# Patient Record
Sex: Female | Born: 1960 | Race: White | Hispanic: No | Marital: Married | Smoking: Former smoker
Health system: Southern US, Community
[De-identification: ages and names within clinical notes are randomized; demographics above are authoritative.]

## PROBLEM LIST (undated history)

## (undated) DIAGNOSIS — I429 Cardiomyopathy, unspecified: Secondary | ICD-10-CM

## (undated) DIAGNOSIS — F5000 Anorexia nervosa, unspecified: Secondary | ICD-10-CM

## (undated) DIAGNOSIS — D649 Anemia, unspecified: Secondary | ICD-10-CM

## (undated) DIAGNOSIS — G35D Multiple sclerosis, unspecified: Secondary | ICD-10-CM

## (undated) DIAGNOSIS — F4310 Post-traumatic stress disorder, unspecified: Secondary | ICD-10-CM

## (undated) DIAGNOSIS — M797 Fibromyalgia: Secondary | ICD-10-CM

## (undated) DIAGNOSIS — M26609 Unspecified temporomandibular joint disorder, unspecified side: Secondary | ICD-10-CM

## (undated) DIAGNOSIS — L89159 Pressure ulcer of sacral region, unspecified stage: Secondary | ICD-10-CM

## (undated) DIAGNOSIS — R579 Shock, unspecified: Secondary | ICD-10-CM

## (undated) DIAGNOSIS — I509 Heart failure, unspecified: Secondary | ICD-10-CM

## (undated) DIAGNOSIS — K7200 Acute and subacute hepatic failure without coma: Secondary | ICD-10-CM

## (undated) DIAGNOSIS — J189 Pneumonia, unspecified organism: Secondary | ICD-10-CM

## (undated) DIAGNOSIS — K589 Irritable bowel syndrome without diarrhea: Secondary | ICD-10-CM

## (undated) DIAGNOSIS — G35 Multiple sclerosis: Secondary | ICD-10-CM

## (undated) DIAGNOSIS — K219 Gastro-esophageal reflux disease without esophagitis: Secondary | ICD-10-CM

## (undated) HISTORY — PX: OTHER SURGICAL HISTORY: SHX169

## (undated) HISTORY — PX: TUBAL LIGATION: SHX77

## (undated) HISTORY — PX: BUNIONECTOMY: SHX129

## (undated) HISTORY — PX: PEG TUBE REMOVAL: SHX2187

## (undated) HISTORY — PX: TONSILLECTOMY: SUR1361

---

## 1998-06-12 ENCOUNTER — Other Ambulatory Visit: Admission: RE | Admit: 1998-06-12 | Discharge: 1998-06-12 | Payer: Self-pay | Admitting: Gynecology

## 1999-07-30 ENCOUNTER — Other Ambulatory Visit: Admission: RE | Admit: 1999-07-30 | Discharge: 1999-07-30 | Payer: Self-pay | Admitting: Gynecology

## 2001-11-14 ENCOUNTER — Other Ambulatory Visit: Admission: RE | Admit: 2001-11-14 | Discharge: 2001-11-14 | Payer: Self-pay | Admitting: Gynecology

## 2002-12-27 ENCOUNTER — Other Ambulatory Visit: Admission: RE | Admit: 2002-12-27 | Discharge: 2002-12-27 | Payer: Self-pay | Admitting: Gynecology

## 2003-02-06 ENCOUNTER — Observation Stay (HOSPITAL_COMMUNITY): Admission: EM | Admit: 2003-02-06 | Discharge: 2003-02-06 | Payer: Self-pay

## 2003-06-13 ENCOUNTER — Encounter (HOSPITAL_COMMUNITY): Admission: RE | Admit: 2003-06-13 | Discharge: 2003-09-11 | Payer: Self-pay | Admitting: General Surgery

## 2003-06-20 ENCOUNTER — Encounter: Admission: RE | Admit: 2003-06-20 | Discharge: 2003-06-20 | Payer: Self-pay | Admitting: General Surgery

## 2003-10-12 ENCOUNTER — Ambulatory Visit (HOSPITAL_COMMUNITY): Admission: RE | Admit: 2003-10-12 | Discharge: 2003-10-12 | Payer: Self-pay | Admitting: Family Medicine

## 2004-03-31 ENCOUNTER — Other Ambulatory Visit: Admission: RE | Admit: 2004-03-31 | Discharge: 2004-03-31 | Payer: Self-pay | Admitting: Gynecology

## 2005-04-07 ENCOUNTER — Other Ambulatory Visit: Admission: RE | Admit: 2005-04-07 | Discharge: 2005-04-07 | Payer: Self-pay | Admitting: Gynecology

## 2005-07-06 HISTORY — PX: DILATION AND CURETTAGE OF UTERUS: SHX78

## 2006-03-25 ENCOUNTER — Encounter: Admission: RE | Admit: 2006-03-25 | Discharge: 2006-03-25 | Payer: Self-pay | Admitting: Gynecology

## 2006-04-08 ENCOUNTER — Ambulatory Visit (HOSPITAL_COMMUNITY): Admission: RE | Admit: 2006-04-08 | Discharge: 2006-04-08 | Payer: Self-pay | Admitting: Gynecology

## 2006-05-24 ENCOUNTER — Ambulatory Visit (HOSPITAL_COMMUNITY): Admission: RE | Admit: 2006-05-24 | Discharge: 2006-05-24 | Payer: Self-pay | Admitting: Gynecology

## 2006-07-20 ENCOUNTER — Encounter: Admission: RE | Admit: 2006-07-20 | Discharge: 2006-07-20 | Payer: Self-pay | Admitting: Gynecology

## 2007-07-25 ENCOUNTER — Encounter: Admission: RE | Admit: 2007-07-25 | Discharge: 2007-07-25 | Payer: Self-pay | Admitting: Gynecology

## 2007-08-18 ENCOUNTER — Inpatient Hospital Stay (HOSPITAL_COMMUNITY): Admission: AD | Admit: 2007-08-18 | Discharge: 2007-08-20 | Payer: Self-pay | Admitting: Internal Medicine

## 2007-09-20 ENCOUNTER — Other Ambulatory Visit: Admission: RE | Admit: 2007-09-20 | Discharge: 2007-09-20 | Payer: Self-pay | Admitting: Gynecology

## 2008-01-25 ENCOUNTER — Encounter: Admission: RE | Admit: 2008-01-25 | Discharge: 2008-01-25 | Payer: Self-pay | Admitting: Gynecology

## 2009-03-27 ENCOUNTER — Ambulatory Visit (HOSPITAL_COMMUNITY): Admission: RE | Admit: 2009-03-27 | Discharge: 2009-03-27 | Payer: Self-pay | Admitting: Family Medicine

## 2009-03-28 ENCOUNTER — Encounter: Admission: RE | Admit: 2009-03-28 | Discharge: 2009-03-28 | Payer: Self-pay | Admitting: Family Medicine

## 2010-04-04 ENCOUNTER — Emergency Department (HOSPITAL_COMMUNITY): Admission: EM | Admit: 2010-04-04 | Discharge: 2010-04-05 | Payer: Self-pay | Admitting: Emergency Medicine

## 2010-05-20 ENCOUNTER — Ambulatory Visit: Payer: Self-pay | Admitting: Psychiatry

## 2010-07-26 ENCOUNTER — Encounter: Payer: Self-pay | Admitting: Gynecology

## 2010-07-27 ENCOUNTER — Encounter: Payer: Self-pay | Admitting: Family Medicine

## 2010-07-27 ENCOUNTER — Encounter: Payer: Self-pay | Admitting: Neurology

## 2010-07-27 ENCOUNTER — Encounter: Payer: Self-pay | Admitting: Gynecology

## 2010-08-28 ENCOUNTER — Other Ambulatory Visit: Payer: Self-pay | Admitting: Gastroenterology

## 2010-08-28 DIAGNOSIS — R195 Other fecal abnormalities: Secondary | ICD-10-CM

## 2010-09-09 ENCOUNTER — Other Ambulatory Visit: Payer: Self-pay

## 2010-09-16 ENCOUNTER — Other Ambulatory Visit: Payer: Self-pay

## 2010-09-18 LAB — POCT I-STAT, CHEM 8
BUN: 13 mg/dL (ref 6–23)
Calcium, Ion: 1.12 mmol/L (ref 1.12–1.32)
Chloride: 105 mEq/L (ref 96–112)
Creatinine, Ser: 0.6 mg/dL (ref 0.4–1.2)
Glucose, Bld: 95 mg/dL (ref 70–99)
HCT: 44 % (ref 36.0–46.0)
Hemoglobin: 15 g/dL (ref 12.0–15.0)
Potassium: 4 mEq/L (ref 3.5–5.1)
Sodium: 139 mEq/L (ref 135–145)
TCO2: 26 mmol/L (ref 0–100)

## 2010-09-18 LAB — CBC
HCT: 38.1 % (ref 36.0–46.0)
Hemoglobin: 13.2 g/dL (ref 12.0–15.0)
MCH: 31.5 pg (ref 26.0–34.0)
MCHC: 34.7 g/dL (ref 30.0–36.0)
MCV: 90.8 fL (ref 78.0–100.0)
Platelets: 210 10*3/uL (ref 150–400)
RBC: 4.19 MIL/uL (ref 3.87–5.11)
RDW: 13.3 % (ref 11.5–15.5)
WBC: 9.5 10*3/uL (ref 4.0–10.5)

## 2010-09-18 LAB — ETHANOL: Alcohol, Ethyl (B): 5 mg/dL (ref 0–10)

## 2010-09-18 LAB — RAPID URINE DRUG SCREEN, HOSP PERFORMED
Amphetamines: NOT DETECTED
Barbiturates: NOT DETECTED
Benzodiazepines: POSITIVE — AB
Cocaine: NOT DETECTED
Opiates: NOT DETECTED
Tetrahydrocannabinol: NOT DETECTED

## 2010-09-23 ENCOUNTER — Other Ambulatory Visit: Payer: Self-pay

## 2010-09-30 ENCOUNTER — Other Ambulatory Visit: Payer: Self-pay

## 2010-11-18 NOTE — H&P (Signed)
NAMEMOMOKO, SLEZAK           ACCOUNT NO.:  [id number]   MEDICAL RECORD NO.:  [id number]          PATIENT TYPE:  INP   LOCATION:  5503                         FACILITY:  [facility]   PHYSICIAN:  Kela Millin, M.D.DATE OF BIRTH:  [birth year]   DATE OF ADMISSION:  08/18/2007  DATE OF DISCHARGE:                              HISTORY & PHYSICAL   PRIMARY CARE PHYSICIAN:  Pam Drown, M.D.   CHIEF COMPLAINT:  Worsening spasticity.   HISTORY OF PRESENT ILLNESS:  The patient is a 50 year old white female  with past medical history significant for fibromyalgia, irritable bowel  syndrome, multiple medication allergies/adverse reactions, as well as a  longstanding history of spasms/tightness in her jaws which has resulted  in her only eating pureed food, also per E-Chart records with a history  of interstitial cystitis, and she presents with the above complaints.  Her primary care physician saw her at the office earlier on the day of  presentation and noted that the patient appeared to be having myoclonic  jerks in both upper and lower extremities and they were so severe that  she was having difficulty walking.  She offered to start the patient on  Baclofen and Xanax but because of her prior history of medication  allergies/reactions, she is generally hesitant about taking any  medications and so she refused any Baclofen but agreed to have a  prescription of Xanax written for her.  Dr. Corliss Blacker spoke with Dr.  Vickey Huger, who is the neurologist that has seen her in the past, and they  agreed that it would be helpful to admit the patient for an MRI and  further evaluation in the hospital.  It is noted that the patient has  had a workup over the years to further evaluate the cause of her  symptoms and multiple sclerosis has been suspected as she also has a  family history of this.  Her workup in the past has included an MRI  without contrast which was negative, and she also was evaluated  at one  of the tertiary care centers and was told that her reflexes were  consistent with multiple sclerosis but that the muscle testing was not.  Per primary care physician, in October 2008 she had an episode of speech  difficulty as well as some urinary incontinence and then on January 15  she presented at the office with burning in her feet and had two beats  of clonus on her exam.  Per the patient, foot exam at the office was  consistent with peripheral neuropathy.  The patient denies any visual  changes, dysphagia, focal weakness, fevers, cough, chest pain, diarrhea,  melena or hematochezia.   PAST MEDICAL HISTORY:  1. As above.  2. History of GERD.   MEDICATIONS:  1. Colace daily.  2. Tylenol p.r.n.  3. Infant Mylicon Drops p.r.n.  4. Pepcid A.C. p.r.n.   ALLERGIES:  SCOPOLAMINE, PENICILLIN.  She also reports that PREDNISONE  causes muscle pain/myalgias and has had multiple other reactions to many  medications.   SOCIAL HISTORY:  She quit tobacco in 1988 after smoking for 16 years.  Remote history of alcohol, marijuana and speed.  She states she had  not had any of this in 25 years.   FAMILY HISTORY:  Her aunt is deceased.  She has relapsing-remitting  multiple sclerosis and breast cancer.  Her half-brother had progressive  multiple sclerosis and died at age of 106.   REVIEW OF SYSTEMS:  As per HPI.   PHYSICAL EXAM:  GENERAL:  The patient is a middle-aged white female,  appears older than stated age and also appears anxious, in no apparent  distress.  VITAL SIGNS:  The temperature is 97.7 with a pulse of 93, respiratory  rate of 18, blood pressure of 110/72, O2 saturation 100% on room air.  HEENT:  PERRL, EOMI.  Sclerae anicteric, moist mucous membranes and no  oral exudates.  NECK:  Supple, no adenopathy, no thyromegaly, and no JVD.  LUNGS:  Clear to auscultation bilaterally.  No crackles or wheezes.  CARDIOVASCULAR:  Regular rate and rhythm.  Normal S1 and S2.   ABDOMEN:  Soft, bowel sounds present, nontender, nondistended, no  organomegaly and no masses palpable.  EXTREMITIES:  No cyanosis and no edema.  NEUROLOGIC:  She is alert and oriented x3.  Cranial nerves II-XII are  grossly intact.  Her strength is 4-5/5 and symmetric, hyperreflexia in  both upper and lower extremities.  Muscle tone is normal.   LABS:  CBC and CMET ordered and pending.   ASSESSMENT AND PLAN:  1. Spasticity.  Subjective muscle tightness, also question myoclonus.      As discussed above.  Will obtain an MRI to evaluate for multiple      sclerosis lesions.  The patient is not willing to take any      medications except low-dose Xanax to help have the MRI done.  I      have consulted neurology and Dr. Thad Ranger will see the patient (on      call for Dr. Vickey Huger).  2. Gastroesophageal reflux disease.  Pepcid A.C. p.r.n. as per patient      preference.  3. History of fibromyalgia.  4. History of irritable bowel syndrome.  5. History of multiple medication allergies/intolerances.      Kela Millin, M.D.  Electronically Signed     ACV/MEDQ  D:  08/19/2007  T:  08/19/2007  Job:  [id number]   cc:   Pam Drown, M.D.  Melvyn Novas, M.D.

## 2010-11-18 NOTE — Consult Note (Signed)
[name], NULL           ACCOUNT NO.:  [id number]   MEDICAL RECORD NO.:  [id number]          PATIENT TYPE:  INP   LOCATION:  5503                         FACILITY:  [facility]   PHYSICIAN:  Casimiro Needle L. Reynolds, M.D.DATE OF BIRTH:  [birth year]   DATE OF CONSULTATION:  08/18/2007  DATE OF DISCHARGE:                                 CONSULTATION   REASON FOR EVALUATION:  Spasticity, question MS.   HISTORY OF PRESENT ILLNESS:  This is an inpatient consultation and  evaluation of this existing [facility] patient, a 50-  year-old woman who has been seen in our office by Dr. Vickey Huger in the  past, although records not available at this time.  She had various  neurologic symptoms at that time, including muscle pain, and the  question of MS was raised.  She had a normal MRI at that time.  She said  she had an EMG/NCV and was diagnosed with neuropathy.  She comes in  today complaining of the various problems going on for the past few  weeks.  She states she had a bad flare-up of pain in her legs a few  weeks ago, which lasted a couple of weeks but has left her with residual  sensations of muscle tightness and spasticity, which has made her feel  unsteady when she walks.  She has also been more tremulous recently.  She also developed some recent stress urinary incontinence with coughing  or standing up.  She says that she had recent laboratory testing in Dr.  Darrell Jewel office, which revealed some deficits.  For reasons that are  not exactly clear to me, she is admitted to the hospital with plans for  an MRI.  She is very concerned about receiving contrast for this MRI, as  she reports a history of allergy to IODINATED CT CONTRAST as well as  other substances.  Neurologic consultation is requested.   PAST MEDICAL HISTORY:  She reports a history of gastroesophageal reflux  disease.  She also has a history of fibromyalgia and irritable bowel  syndrome.  She claims that she  can only eat blended foods because she is  unable to open her mouth.  She says that she has had problems with  muscle tightness for as long as 18 years, but this has defied diagnosis.   Family/social history/review of systems per admission H&P by Dr. Suanne Marker.   MEDICATIONS:  The patient takes over-the-counter Pepcid A.C. and Tylenol  as needed.   ALLERGIES:  She reports having a bad reaction scopolamine in the past,  which per old records seems to be due to side effects.  She also reports  allergies to ASPIRIN and PENICILLIN and intolerances to numerous other  substances.   PHYSICAL EXAMINATION:  VITAL SIGNS:  Temperature 98.0, blood pressure  January 113/77, pulse 109, respirations 21, O2 saturation 100% on some  room air.  GENERAL:  This is a thin, anxious-appearing woman, who is seated, in no  distress.  HEAD:  Cranium is normocephalic, atraumatic.  Oropharynx benign.  NECK:  Supple without carotid or supraclavicular bruits.  HEART:  Regular rate and without  murmurs.  NEUROLOGIC:  Mental status:  She is awake and alert.  She is oriented to  time and place.  Remote and memory are adequate for history-giving.  Speech is fluent and not dysarthric.  Cranial nerves:  Pupils are equal  and reactive.  Extraocular movements full without nystagmus.  Visual  fields full to confrontation.  Hearing is intact to conversational  speech.  Face moves symmetrically.  She does not protrude her tongue  very well and does not open her mouth wide enough to visualize her  palate.  Motor:  Normal bulk and tone.  She has give-away weakness,  especially distally in the extremities.  Sensory:  Intact to light  touch, pinprick and double simultaneous stimulation in all extremities.  Coordination:  Rapid movements are performed accurately.  Finger-to-nose  is performed accurately but it elicits a little bit of a kinetic tremor.  Heel-to-shin is performed accurately.  Gait:  he arises slowly from a  chair.   Her gait is a little bit listing and wide-based, but she  ambulates reasonably well.  She is able to perform a Romberg maneuver  and can stand on tiptoe briefly.  Reflexes:  2+ and symmetric.  Toes are  downgoing bilaterally.  No ankle clonus.   LABORATORY REVIEW:  CBC and CMET  are essentially normal.   IMPRESSION:  1. Subjective muscle tightness and weakness.  She does not have very      much in the way of objective abnormalities on examination, but she      does have give-away weakness.  2. Bilateral foot pain, presumably secondary to neuropathy.  3. Question multiple sclerosis.  I doubt she has this, but apparently      there has been a persistent question.  The only new symptom that      really strongly suggests this is the urinary incontinence, although      I suspect that is more likely due to pelvic relaxation.  She is      fearful of receiving contrast.   RECOMMENDATIONS:  Will check an MRI of the brain, and it is also  appropriate to check MRI of the cervical and thoracic spines, to look  for cord lesions, especially given the urinary symptoms.  I do not think  it is very important that these studies be done with contrast, since if  they are normal, contrast not be meaningfully improve the yield.  Furthermore, she can always have a contrast study if these studies are  abnormal.  If the study is unremarkable, we will treat only with  reassurance.      Michael L. Thad Ranger, M.D.  Electronically Signed     MLR/MEDQ  D:  08/18/2007  T:  08/20/2007  Job:  [id number]   cc:   Pam Drown, M.D.  Melvyn Novas, M.D.

## 2010-11-21 NOTE — Consult Note (Signed)
[name], [name]                     ACCOUNT NO.:  [id number]   MEDICAL RECORD NO.:  [id number]                   PATIENT TYPE:  OBV   LOCATION:  0447                                 [facility]ACILITY[facility]   PHYSICIAN:  Jackie Plum, M.D.             DATE OF BIRTH:  08-May-1961   DATE OF CONSULTATION:  02/05/2003  DATE OF DISCHARGE:  02/06/2003                                   CONSULTATION   REPORT TITLE:  INTERNAL MEDICINE CONSULTATION   REFERRING PHYSICIAN:  Pam Drown, M.D.   PROBLEM LIST:  1. Muscle weakness, feeling of dehydration, mouth dryness secondary to     adverse reaction to scopolamine.  2. History of fibromyalgia.  3. Interstitial cystitis.  4. Irritable bowel syndrome.  5. Status post urethroscopy three days ago.   HISTORY OF PRESENT ILLNESS:  The patient is a 50 year old Caucasian lady  with history of fibromyalgia who was asked to be evaluated by, Dr. Corliss Blacker,  of [facility] today.  The patient had presented with complaints  of a feeling of dehydration, subjectively over the last two days.  She had  also indicated some subjective shortness of breath with some left-sided  chest discomfort on breathing.  These symptoms were not significant at this  time during my evaluation.  She had been started on scopolamine patches  recently which were discontinued about three days ago on account of her  symptomatology.  The patient was asked to be evaluated by EDP today on  account of suspicion for possible syndrome related to scopolamine which may  predispose the patient to respiratory arrest.  At the time of my evaluation,  Dr. Darrell Jewel office is closed and I could not clarify the reasons related  to this suspicion.   REVIEW OF SYSTEMS:  GENERAL:  The patient denies any history of fever,  chills, sore throat, post nasal drainage, congestion, cough, sputum  production, shortness of breath at the time of my evaluation.  GASTROINTESTINAL:  No GI  symptoms.  GENITOURINARY:  She had had some  difficulty urinating.  However, she was able to urinate without any problems  during ER evaluation.  NEUROLOGICAL:  She had been restless prior to my  evaluation but had calmed down at the time of my evaluation.  There is no  history of confusion or loss of consciousness but had felt a little bit  dizzy.   PAST MEDICAL HISTORY:  See problem list above.   MEDICATIONS:  Guiafenesin and scopolamine.   ALLERGIES:  PENICILLIN.   REVIEW OF SYSTEMS:  Significant positive and negative symptomatology as in  HPI above.   PHYSICAL EXAMINATION:  VITAL SIGNS:  Blood pressure 170/91, pulse 97,  respiratory rate 22 on examination in the ED.  However, orthostatic checks  during my evaluation after receiving one set of IV fluids was notable for BP  89/52 and heart rate 59, lying.  Corresponding blood pressure of 92/44 and  pulse rate of 102 on standing.  GENERAL:  Acutely ill-looking.  She was not in any cardiopulmonary distress.  HEENT:  She had mild buccal mucosa dryness without any exudate or erythema.  LUNGS:  Unremarkable.  NECK:  No thyromegaly.  CARDIAC:  No gallops.  ABDOMEN:  Negative for any acute abnormalities.  EXTREMITIES:  No edema.  CENTRAL NERVOUS SYSTEM:  She is alert and oriented x3.  No other  abnormalities.   LABORATORY DATA:  WBC 5.1, hemoglobin 13.3, hematocrit 39.7, MCH 9.7,  platelet count 197.  Sodium 141, potassium 3.3, CO2 33, chloride 110,  glucose 97, BUN 7, creatinine 0.8.  Calcium 9.3, total protein 7.1, albumin  4.5, AST 20, ALT 18, alkaline phosphatase 32, total bilirubin 0.7.   CT scan of the lower extremities was done to rule out possible PE on account  of prior complaints of shortness of breath, I believe.  Impression of CT  scan was notable for absence of any evidence of pulmonary embolism.  No DVT  or bruises.  Small amount of free  fluid noted in the pelvis.   IMPRESSION:  Adverse reaction to scopolamine.  The  patient presents with  classical of symptoms which is clinically significant to scopolamine.  These  include dry mouth, dizziness and difficulty urinating.  The patient has been  moved to the unit from the ED without significant problems.  However, still  has some amount of dizziness.  She is orthostatic clinically by pulse with  increasing pulse of 59 to 122 on standing up.   PLAN:  The plan of care will be to admit the patient x24 hour observation.  IV fluids were given and symptomatic support will be offered as well.  The  patient's complaints of generalized weakness most likely related to side  effects of scopolamine.                                               Jackie Plum, M.D.    GO/MEDQ  D:  02/06/2003  T:  02/06/2003  Job:  [id number]   cc:   Pam Drown, M.D.  [address]  [place]  [place] [address]  Fax: [phone]   Pearletha Alfred, M.D.  [address]  [place] [address]  Fax: [phone]   Lilyan Punt. Sydnee Levans, M.D.  [address]  [place]  [place] [address]  Fax: [phone]

## 2011-03-27 LAB — COMPREHENSIVE METABOLIC PANEL
ALT: 28
AST: 21
Albumin: 4.5
Alkaline Phosphatase: 36 — ABNORMAL LOW
BUN: 8
CO2: 24
Calcium: 9.5
Chloride: 107
Creatinine, Ser: 0.62
GFR calc Af Amer: >60
GFR calc non Af Amer: >60
Glucose, Bld: 100 — ABNORMAL HIGH
Potassium: 3.6
Sodium: 139
Total Bilirubin: 0.6
Total Protein: 6.7

## 2011-03-27 LAB — CBC
HCT: 39.6
Hemoglobin: 13.4
MCHC: 33.9
MCV: 90.6
Platelets: 225
RBC: 4.37
RDW: 12.5
WBC: 5.4

## 2011-07-07 HISTORY — PX: THORACENTESIS: SHX235

## 2012-06-05 HISTORY — PX: TRACHEOSTOMY: SUR1362

## 2012-06-06 ENCOUNTER — Emergency Department (HOSPITAL_COMMUNITY): Payer: Managed Care, Other (non HMO)

## 2012-06-06 ENCOUNTER — Encounter (HOSPITAL_COMMUNITY): Payer: Self-pay | Admitting: *Deleted

## 2012-06-06 ENCOUNTER — Inpatient Hospital Stay (HOSPITAL_COMMUNITY)
Admission: EM | Admit: 2012-06-06 | Discharge: 2012-06-20 | DRG: 004 | Disposition: A | Discharge status: Other Acute Inpatient Hospital | Payer: Managed Care, Other (non HMO) | Attending: Pulmonary Disease | Admitting: Pulmonary Disease

## 2012-06-06 DIAGNOSIS — T68XXXA Hypothermia, initial encounter: Secondary | ICD-10-CM

## 2012-06-06 DIAGNOSIS — N390 Urinary tract infection, site not specified: Secondary | ICD-10-CM

## 2012-06-06 DIAGNOSIS — L89109 Pressure ulcer of unspecified part of back, unspecified stage: Secondary | ICD-10-CM | POA: Diagnosis present

## 2012-06-06 DIAGNOSIS — E876 Hypokalemia: Secondary | ICD-10-CM | POA: Diagnosis present

## 2012-06-06 DIAGNOSIS — E162 Hypoglycemia, unspecified: Secondary | ICD-10-CM

## 2012-06-06 DIAGNOSIS — I420 Dilated cardiomyopathy: Secondary | ICD-10-CM

## 2012-06-06 DIAGNOSIS — A419 Sepsis, unspecified organism: Secondary | ICD-10-CM

## 2012-06-06 DIAGNOSIS — R570 Cardiogenic shock: Secondary | ICD-10-CM | POA: Diagnosis present

## 2012-06-06 DIAGNOSIS — R5381 Other malaise: Secondary | ICD-10-CM | POA: Diagnosis present

## 2012-06-06 DIAGNOSIS — L02419 Cutaneous abscess of limb, unspecified: Secondary | ICD-10-CM | POA: Diagnosis present

## 2012-06-06 DIAGNOSIS — E43 Unspecified severe protein-calorie malnutrition: Secondary | ICD-10-CM | POA: Diagnosis present

## 2012-06-06 DIAGNOSIS — J189 Pneumonia, unspecified organism: Secondary | ICD-10-CM

## 2012-06-06 DIAGNOSIS — L89159 Pressure ulcer of sacral region, unspecified stage: Secondary | ICD-10-CM

## 2012-06-06 DIAGNOSIS — J690 Pneumonitis due to inhalation of food and vomit: Secondary | ICD-10-CM

## 2012-06-06 DIAGNOSIS — Z8659 Personal history of other mental and behavioral disorders: Secondary | ICD-10-CM

## 2012-06-06 DIAGNOSIS — R001 Bradycardia, unspecified: Secondary | ICD-10-CM

## 2012-06-06 DIAGNOSIS — G9341 Metabolic encephalopathy: Secondary | ICD-10-CM | POA: Diagnosis present

## 2012-06-06 DIAGNOSIS — D649 Anemia, unspecified: Secondary | ICD-10-CM

## 2012-06-06 DIAGNOSIS — G709 Myoneural disorder, unspecified: Secondary | ICD-10-CM

## 2012-06-06 DIAGNOSIS — R579 Shock, unspecified: Secondary | ICD-10-CM

## 2012-06-06 DIAGNOSIS — E861 Hypovolemia: Secondary | ICD-10-CM

## 2012-06-06 DIAGNOSIS — B952 Enterococcus as the cause of diseases classified elsewhere: Secondary | ICD-10-CM | POA: Diagnosis present

## 2012-06-06 DIAGNOSIS — R627 Adult failure to thrive: Secondary | ICD-10-CM | POA: Diagnosis present

## 2012-06-06 DIAGNOSIS — D689 Coagulation defect, unspecified: Secondary | ICD-10-CM

## 2012-06-06 DIAGNOSIS — L8993 Pressure ulcer of unspecified site, stage 3: Secondary | ICD-10-CM | POA: Diagnosis present

## 2012-06-06 DIAGNOSIS — G9340 Encephalopathy, unspecified: Secondary | ICD-10-CM

## 2012-06-06 DIAGNOSIS — D696 Thrombocytopenia, unspecified: Secondary | ICD-10-CM

## 2012-06-06 DIAGNOSIS — M26609 Unspecified temporomandibular joint disorder, unspecified side: Secondary | ICD-10-CM | POA: Diagnosis present

## 2012-06-06 DIAGNOSIS — I959 Hypotension, unspecified: Secondary | ICD-10-CM

## 2012-06-06 DIAGNOSIS — J1569 Pneumonia due to other gram-negative bacteria: Secondary | ICD-10-CM | POA: Diagnosis present

## 2012-06-06 DIAGNOSIS — J9600 Acute respiratory failure, unspecified whether with hypoxia or hypercapnia: Principal | ICD-10-CM

## 2012-06-06 DIAGNOSIS — R739 Hyperglycemia, unspecified: Secondary | ICD-10-CM

## 2012-06-06 DIAGNOSIS — E46 Unspecified protein-calorie malnutrition: Secondary | ICD-10-CM

## 2012-06-06 DIAGNOSIS — R1310 Dysphagia, unspecified: Secondary | ICD-10-CM | POA: Diagnosis present

## 2012-06-06 DIAGNOSIS — K7200 Acute and subacute hepatic failure without coma: Secondary | ICD-10-CM

## 2012-06-06 DIAGNOSIS — Z7401 Bed confinement status: Secondary | ICD-10-CM

## 2012-06-06 DIAGNOSIS — J90 Pleural effusion, not elsewhere classified: Secondary | ICD-10-CM | POA: Diagnosis present

## 2012-06-06 DIAGNOSIS — Z681 Body mass index (BMI) 19 or less, adult: Secondary | ICD-10-CM

## 2012-06-06 DIAGNOSIS — L8995 Pressure ulcer of unspecified site, unstageable: Secondary | ICD-10-CM | POA: Diagnosis present

## 2012-06-06 DIAGNOSIS — I428 Other cardiomyopathies: Secondary | ICD-10-CM | POA: Diagnosis present

## 2012-06-06 DIAGNOSIS — R6521 Severe sepsis with septic shock: Secondary | ICD-10-CM

## 2012-06-06 DIAGNOSIS — L89309 Pressure ulcer of unspecified buttock, unspecified stage: Secondary | ICD-10-CM | POA: Diagnosis present

## 2012-06-06 DIAGNOSIS — J156 Pneumonia due to other aerobic Gram-negative bacteria: Secondary | ICD-10-CM | POA: Diagnosis present

## 2012-06-06 HISTORY — DX: Irritable bowel syndrome, unspecified: K58.9

## 2012-06-06 HISTORY — DX: Multiple sclerosis, unspecified: G35.D

## 2012-06-06 HISTORY — DX: Post-traumatic stress disorder, unspecified: F43.10

## 2012-06-06 HISTORY — DX: Fibromyalgia: M79.7

## 2012-06-06 HISTORY — DX: Gastro-esophageal reflux disease without esophagitis: K21.9

## 2012-06-06 HISTORY — DX: Unspecified temporomandibular joint disorder, unspecified side: M26.609

## 2012-06-06 HISTORY — DX: Multiple sclerosis: G35

## 2012-06-06 LAB — URINE MICROSCOPIC-ADD ON

## 2012-06-06 LAB — CBC WITH DIFFERENTIAL/PLATELET
Basophils Absolute: 0 10*3/uL (ref 0.0–0.1)
Basophils Absolute: 0 10*3/uL (ref 0.0–0.1)
Basophils Relative: 0 % (ref 0–1)
Basophils Relative: 0 % (ref 0–1)
Eosinophils Absolute: 0 10*3/uL (ref 0.0–0.7)
Eosinophils Absolute: 0 10*3/uL (ref 0.0–0.7)
Eosinophils Relative: 0 % (ref 0–5)
Eosinophils Relative: 0 % (ref 0–5)
HCT: 11.9 % — ABNORMAL LOW (ref 36.0–46.0)
HCT: 10.3 % — ABNORMAL LOW (ref 36.0–46.0)
Hemoglobin: 3.1 g/dL — CL (ref 12.0–15.0)
Hemoglobin: 2.8 g/dL — CL (ref 12.0–15.0)
Lymphocytes Relative: 0 % — ABNORMAL LOW (ref 12–46)
Lymphocytes Relative: 1 % — ABNORMAL LOW (ref 12–46)
Lymphs Abs: 0 10*3/uL — ABNORMAL LOW (ref 0.7–4.0)
Lymphs Abs: 0.3 10*3/uL — ABNORMAL LOW (ref 0.7–4.0)
MCH: 18 pg — ABNORMAL LOW (ref 26.0–34.0)
MCH: 18.7 pg — ABNORMAL LOW (ref 26.0–34.0)
MCHC: 26.1 g/dL — ABNORMAL LOW (ref 30.0–36.0)
MCHC: 27.2 g/dL — ABNORMAL LOW (ref 30.0–36.0)
MCV: 69.2 fL — ABNORMAL LOW (ref 78.0–100.0)
MCV: 68.7 fL — ABNORMAL LOW (ref 78.0–100.0)
Monocytes Absolute: 0.3 10*3/uL (ref 0.1–1.0)
Monocytes Absolute: 0.3 10*3/uL (ref 0.1–1.0)
Monocytes Relative: 1 % — ABNORMAL LOW (ref 3–12)
Monocytes Relative: 1 % — ABNORMAL LOW (ref 3–12)
Neutro Abs: 31.7 10*3/uL — ABNORMAL HIGH (ref 1.7–7.7)
Neutro Abs: 28.9 10*3/uL — ABNORMAL HIGH (ref 1.7–7.7)
Neutrophils Relative %: 99 % — ABNORMAL HIGH (ref 43–77)
Neutrophils Relative %: 98 % — ABNORMAL HIGH (ref 43–77)
Platelets: 57 10*3/uL — ABNORMAL LOW (ref 150–400)
Platelets: 50 10*3/uL — ABNORMAL LOW (ref 150–400)
RBC: 1.72 MIL/uL — ABNORMAL LOW (ref 3.87–5.11)
RBC: 1.5 MIL/uL — ABNORMAL LOW (ref 3.87–5.11)
RDW: 26.5 % — ABNORMAL HIGH (ref 11.5–15.5)
RDW: 26.7 % — ABNORMAL HIGH (ref 11.5–15.5)
WBC: 32 10*3/uL — ABNORMAL HIGH (ref 4.0–10.5)
WBC: 29.5 10*3/uL — ABNORMAL HIGH (ref 4.0–10.5)

## 2012-06-06 LAB — COMPREHENSIVE METABOLIC PANEL
ALT: 78 U/L — ABNORMAL HIGH (ref 0–35)
ALT: 69 U/L — ABNORMAL HIGH (ref 0–35)
AST: 110 U/L — ABNORMAL HIGH (ref 0–37)
AST: 99 U/L — ABNORMAL HIGH (ref 0–37)
Albumin: 1.7 g/dL — ABNORMAL LOW (ref 3.5–5.2)
Albumin: 1.5 g/dL — ABNORMAL LOW (ref 3.5–5.2)
Alkaline Phosphatase: 199 U/L — ABNORMAL HIGH (ref 39–117)
Alkaline Phosphatase: 176 U/L — ABNORMAL HIGH (ref 39–117)
BUN: 34 mg/dL — ABNORMAL HIGH (ref 6–23)
BUN: 33 mg/dL — ABNORMAL HIGH (ref 6–23)
CO2: 24 mEq/L (ref 19–32)
CO2: 21 mEq/L (ref 19–32)
Calcium: 7.1 mg/dL — ABNORMAL LOW (ref 8.4–10.5)
Calcium: 6.8 mg/dL — ABNORMAL LOW (ref 8.4–10.5)
Chloride: 109 mEq/L (ref 96–112)
Chloride: 110 mEq/L (ref 96–112)
Creatinine, Ser: 0.83 mg/dL (ref 0.50–1.10)
Creatinine, Ser: 0.84 mg/dL (ref 0.50–1.10)
GFR calc Af Amer: >90 mL/min (ref >90)
GFR calc Af Amer: >90 mL/min (ref >90)
GFR calc non Af Amer: 81 mL/min — ABNORMAL LOW (ref >90)
GFR calc non Af Amer: 80 mL/min — ABNORMAL LOW (ref >90)
Glucose, Bld: 36 mg/dL — CL (ref 70–99)
Glucose, Bld: 184 mg/dL — ABNORMAL HIGH (ref 70–99)
Potassium: 4.1 mEq/L (ref 3.5–5.1)
Potassium: 3.6 mEq/L (ref 3.5–5.1)
Sodium: 143 mEq/L (ref 135–145)
Sodium: 143 mEq/L (ref 135–145)
Total Bilirubin: 0.8 mg/dL (ref 0.3–1.2)
Total Bilirubin: 0.7 mg/dL (ref 0.3–1.2)
Total Protein: 4.2 g/dL — ABNORMAL LOW (ref 6.0–8.3)
Total Protein: 3.8 g/dL — ABNORMAL LOW (ref 6.0–8.3)

## 2012-06-06 LAB — DIC (DISSEMINATED INTRAVASCULAR COAGULATION)PANEL
D-Dimer, Quant: 9.69 ug/mL-FEU — ABNORMAL HIGH (ref 0.00–0.48)
Fibrinogen: 153 mg/dL — ABNORMAL LOW (ref 204–475)
Platelets: 57 10*3/uL — ABNORMAL LOW (ref 150–400)
Prothrombin Time: 26.6 seconds — ABNORMAL HIGH (ref 11.6–15.2)
aPTT: 69 seconds — ABNORMAL HIGH (ref 24–37)

## 2012-06-06 LAB — DIC (DISSEMINATED INTRAVASCULAR COAGULATION) PANEL: INR: 2.6 — ABNORMAL HIGH (ref 0.00–1.49)

## 2012-06-06 LAB — RETICULOCYTES
RBC.: 1.5 MIL/uL — ABNORMAL LOW (ref 3.87–5.11)
Retic Count, Absolute: 40.5 10*3/uL (ref 19.0–186.0)
Retic Ct Pct: 2.7 % (ref 0.4–3.1)

## 2012-06-06 LAB — GLUCOSE, CAPILLARY: Glucose-Capillary: 194 mg/dL — ABNORMAL HIGH (ref 70–99)

## 2012-06-06 LAB — URINALYSIS, ROUTINE W REFLEX MICROSCOPIC
Glucose, UA: NEGATIVE mg/dL
Ketones, ur: NEGATIVE mg/dL
Nitrite: NEGATIVE
Protein, ur: 30 mg/dL — AB
Specific Gravity, Urine: 1.019 (ref 1.005–1.030)
Urobilinogen, UA: 2 mg/dL — ABNORMAL HIGH (ref 0.0–1.0)
pH: 5 (ref 5.0–8.0)

## 2012-06-06 LAB — PREPARE RBC (CROSSMATCH)

## 2012-06-06 LAB — LACTIC ACID, PLASMA
Lactic Acid, Venous: 1.3 mmol/L (ref 0.5–2.2)
Lactic Acid, Venous: 2.3 mmol/L — ABNORMAL HIGH (ref 0.5–2.2)

## 2012-06-06 LAB — ABO/RH: ABO/RH(D): A POS

## 2012-06-06 LAB — TROPONIN I: Troponin I: <0.3 ng/mL (ref <0.30)

## 2012-06-06 LAB — LACTATE DEHYDROGENASE: LDH: 293 U/L — ABNORMAL HIGH (ref 94–250)

## 2012-06-06 MED ORDER — SODIUM CHLORIDE 0.9 % IV BOLUS (SEPSIS)
1000.0000 mL | Freq: Once | INTRAVENOUS | Status: AC
  Administered 2012-06-06: 1000 mL via INTRAVENOUS

## 2012-06-06 MED ORDER — MIDAZOLAM HCL 2 MG/2ML IJ SOLN
INTRAMUSCULAR | Status: AC
  Administered 2012-06-06: 2 mg
  Filled 2012-06-06: qty 4

## 2012-06-06 MED ORDER — VANCOMYCIN HCL IN DEXTROSE 1-5 GM/200ML-% IV SOLN
1000.0000 mg | Freq: Once | INTRAVENOUS | Status: AC
  Administered 2012-06-06: 1000 mg via INTRAVENOUS
  Filled 2012-06-06: qty 200

## 2012-06-06 MED ORDER — PIPERACILLIN-TAZOBACTAM 3.375 G IVPB
3.3750 g | Freq: Once | INTRAVENOUS | Status: AC
  Administered 2012-06-06: 3.375 g via INTRAVENOUS
  Filled 2012-06-06: qty 50

## 2012-06-06 MED ORDER — DOPAMINE-DEXTROSE 3.2-5 MG/ML-% IV SOLN
2.0000 ug/kg/min | Freq: Once | INTRAVENOUS | Status: AC
  Administered 2012-06-06: 2 ug/kg/min via INTRAVENOUS
  Filled 2012-06-06: qty 250

## 2012-06-06 MED ORDER — FENTANYL CITRATE 0.05 MG/ML IJ SOLN
INTRAMUSCULAR | Status: AC
  Administered 2012-06-06: 100 ug
  Filled 2012-06-06: qty 2

## 2012-06-06 MED ORDER — KCL IN DEXTROSE-NACL 20-5-0.45 MEQ/L-%-% IV SOLN
Freq: Once | INTRAVENOUS | Status: AC
  Administered 2012-06-06: 22:00:00 via INTRAVENOUS
  Filled 2012-06-06: qty 1000

## 2012-06-06 MED ORDER — DEXTROSE 50 % IV SOLN
1.0000 | Freq: Once | INTRAVENOUS | Status: AC
  Administered 2012-06-06: 50 mL via INTRAVENOUS
  Filled 2012-06-06: qty 50

## 2012-06-06 MED ORDER — ETOMIDATE 2 MG/ML IV SOLN
INTRAVENOUS | Status: AC
  Administered 2012-06-06: 20 mg
  Filled 2012-06-06: qty 10

## 2012-06-06 NOTE — ED Notes (Signed)
Phlebotomy at bedside.

## 2012-06-06 NOTE — ED Notes (Signed)
Pt here from home, family reports hx of undiagnosed MS. Pressure ulcer/weeping sores on lower extremities, bilat

## 2012-06-06 NOTE — ED Provider Notes (Signed)
History     CSN: [id number]  Arrival date & time 06/06/12  1736   First MD Initiated Contact with Patient 06/06/12 1916      Chief Complaint  Patient presents with  . Pressure Ulcer   Level 5 caveat due to altered mental status.  (Consider location/radiation/quality/duration/timing/severity/associated sxs/prior treatment) The history is provided by the patient and a relative.   patient was brought in for altered mental status. She some unknown neurologic disorder that causes or weakness. Per family they state it is like MS but has not had any lesions on MRI. She's been doing worse over the last 2 days. She's reportedly been on disability for 20 years. Has had decreased oral intake. She's also had difficulty eating. His been on a liquid diet. A she's not able to get herself out of bed without help. She's now minimally able to talk.  Past Medical History  Diagnosis Date  . MS (multiple sclerosis)   . IBS (irritable bowel syndrome)   . PTSD (post-traumatic stress disorder)   . Fibromyalgia syndrome   . TMJ disease     Past Surgical History  Procedure Date  . Cesarean section   . Bunionectomy     No family history on file.  History  Substance Use Topics  . Smoking status: Never Smoker   . Smokeless tobacco: Not on file  . Alcohol Use: No    OB History    Grav Para Term Preterm Abortions TAB SAB Ect Mult Living                  Review of Systems  Unable to perform ROS: Mental status change    Allergies  Scopolamine and Ivp dye  Home Medications  No current outpatient prescriptions on file.  BP 108/81  Pulse 57  Resp 15  Ht 5\' 2"  (1.575 m)  Wt 102 lb (46.267 kg)  BMI 18.66 kg/m2  SpO2 100%  Physical Exam  Constitutional:       Cachectic  HENT:  Head: Atraumatic.  Eyes: Pupils are equal, round, and reactive to light.  Neck: JVD present.  Cardiovascular:       Bradycardia  Pulmonary/Chest: She has wheezes.       Diffuse harsh breath sounds    Abdominal: There is no tenderness.  Musculoskeletal:       Contractures  Neurological:       Minimally responsive. We'll say some words to husband. Not follow commands fully. Withdraws from pain  Skin: No erythema. There is pallor.       Red rash bilateral lower extremities with edema. Patient appears jaundiced. She is pale    ED Course  Procedures (including critical care time)  Labs Reviewed  CBC WITH DIFFERENTIAL - Abnormal; Notable for the following:    WBC 32.0 (*)     RBC 1.72 (*)     Hemoglobin 3.1 (*)     HCT 11.9 (*)     MCV 69.2 (*)     MCH 18.0 (*)     MCHC 26.1 (*)     RDW 26.5 (*)     Platelets 57 (*)     Neutrophils Relative 99 (*)     Lymphocytes Relative 0 (*)     Monocytes Relative 1 (*)     Neutro Abs 31.7 (*)     Lymphs Abs 0.0 (*)     All other components within normal limits  COMPREHENSIVE METABOLIC PANEL - Abnormal; Notable for the following:  Glucose, Bld 36 (*)     BUN 34 (*)     Calcium 7.1 (*)     Total Protein 4.2 (*)     Albumin 1.7 (*)     AST 110 (*)     ALT 78 (*)     Alkaline Phosphatase 199 (*)     GFR calc non Af Amer 81 (*)     All other components within normal limits  URINALYSIS, ROUTINE W REFLEX MICROSCOPIC - Abnormal; Notable for the following:    Color, Urine ORANGE (*)  BIOCHEMICALS MAY BE AFFECTED BY COLOR   APPearance TURBID (*)     Hgb urine dipstick MODERATE (*)     Bilirubin Urine MODERATE (*)     Protein, ur 30 (*)     Urobilinogen, UA 2.0 (*)     Leukocytes, UA SMALL (*)     All other components within normal limits  CBC WITH DIFFERENTIAL - Abnormal; Notable for the following:    WBC 29.5 (*)     RBC 1.50 (*)     Hemoglobin 2.8 (*)     HCT 10.3 (*)     MCV 68.7 (*)     MCH 18.7 (*)     MCHC 27.2 (*)     RDW 26.7 (*)     Platelets 50 (*)     Neutrophils Relative 98 (*)     Lymphocytes Relative 1 (*)     Monocytes Relative 1 (*)     Neutro Abs 28.9 (*)     Lymphs Abs 0.3 (*)     All other components  within normal limits  COMPREHENSIVE METABOLIC PANEL - Abnormal; Notable for the following:    Glucose, Bld 184 (*)     BUN 33 (*)     Calcium 6.8 (*)     Total Protein 3.8 (*)     Albumin 1.5 (*)     AST 99 (*)     ALT 69 (*)     Alkaline Phosphatase 176 (*)     GFR calc non Af Amer 80 (*)     All other components within normal limits  LACTIC ACID, PLASMA - Abnormal; Notable for the following:    Lactic Acid, Venous 2.3 (*)     All other components within normal limits  LACTATE DEHYDROGENASE - Abnormal; Notable for the following:    LDH 293 (*)     All other components within normal limits  RETICULOCYTES - Abnormal; Notable for the following:    RBC. 1.50 (*)     All other components within normal limits  DIC (DISSEMINATED INTRAVASCULAR COAGULATION) PANEL - Abnormal; Notable for the following:    Prothrombin Time 26.6 (*)     INR 2.60 (*)     aPTT 69 (*)     Fibrinogen 153 (*)     D-Dimer, Quant 9.69 (*)     Platelets 57 (*)     All other components within normal limits  GLUCOSE, CAPILLARY - Abnormal; Notable for the following:    Glucose-Capillary 194 (*)     All other components within normal limits  URINE MICROSCOPIC-ADD ON - Abnormal; Notable for the following:    Squamous Epithelial / LPF FEW (*)     Bacteria, UA FEW (*)     Casts HYALINE CASTS (*)     All other components within normal limits  LACTIC ACID, PLASMA  TROPONIN I  TYPE AND SCREEN  PREPARE RBC (CROSSMATCH)  ABO/RH  URINE CULTURE  CULTURE, BLOOD (ROUTINE X 2)  HAPTOGLOBIN   Dg Pelvis Portable  06/06/2012  *RADIOLOGY REPORT*  Clinical Data: Sacral pressure ulcer.  PORTABLE PELVIS  Comparison: CT of the abdomen and pelvis performed 03/27/2009  Findings: There is no evidence of fracture or dislocation.  Both femoral heads are seated normally within their respective acetabula.  No significant degenerative change is appreciated.  The sacroiliac joints are unremarkable in appearance.  The visualized bowel gas  pattern is grossly unremarkable in appearance; a large amount of stool and air are seen within the mildly distended colon.  IMPRESSION: No evidence of fracture or dislocation.   Original Report Authenticated By: Tonia Ghent, M.D.    Dg Chest Portable 1 View  06/06/2012  *RADIOLOGY REPORT*  Clinical Data: Shortness of breath; weakness.  PORTABLE CHEST - 1 VIEW  Comparison: Chest radiograph performed 03/27/2009  Findings: There is relatively diffuse bilateral airspace opacification, with some degree of central sparing, raising suspicion for multifocal pneumonia.  A small to moderate right- sided pleural effusion is seen.  No pneumothorax is identified.  The cardiomediastinal silhouette is within normal limits.  No acute osseous abnormalities are seen.  IMPRESSION: Relatively diffuse bilateral airspace opacification raises concern for multifocal pneumonia.  Associated new small to moderate right- sided pleural effusion seen.   Original Report Authenticated By: Tonia Ghent, M.D.      1. Anemia   2. Thrombocytopenia   3. CAP (community acquired pneumonia)   4. Hypotension   5. Decubitus ulcer of coccygeal region     Date: 06/07/2012  Rate:55  Rhythm: sinus bradycardia  QRS Axis: normal  Intervals: normal  ST/T Wave abnormalities: normal  Conduction Disutrbances:nonspecific intraventricular conduction delay  Narrative Interpretation: incomplete LBBB is new. Rate is decreased.   Old EKG Reviewed: changes noted  Angiocath insertion Performed by: Billee Cashing.  Consent: Verbal consent obtained. Risks and benefits: risks, benefits and alternatives were discussed Time out: Immediately prior to procedure a "time out" was called to verify the correct patient, procedure, equipment, support staff and site/side marked as required.  Preparation: Patient was prepped and draped in the usual sterile fashion.  Vein Location: left EJ   Gauge: 20  Normal blood return and flush without  difficulty Patient tolerance: Patient tolerated the procedure well with no immediate complications.  CRITICAL CARE Performed by: Billee Cashing   Total critical care time: 60  Critical care time was exclusive of separately billable procedures and treating other patients.  Critical care was necessary to treat or prevent imminent or life-threatening deterioration.  Critical care was time spent personally by me on the following activities: development of treatment plan with patient and/or surrogate as well as nursing, discussions with consultants, evaluation of patient's response to treatment, examination of patient, obtaining history from patient or surrogate, ordering and performing treatments and interventions, ordering and review of laboratory studies, ordering and review of radiographic studies, pulse oximetry and re-evaluation of patient's condition.   MDM  Patient presents with altered mental status. She's been hypotensive and was found to be severely anemic. She has a hemoglobin of 3.1. She is also thrombocytopenic. Her white count is elevated at area. Chest x-ray shows possible bilateral there was aspiration pneumonia. She has a large left fascial decubitus ulcer. Patient has remained hypotensive. IV fluids were given. IV access was initially difficult and blood was not able to be obtained initially. I drew while I started an IV on her left external jugular vein. Patient remained hypotensive after the  fluid boluses and was given emergency release packed red blood cells. She's also typed and crossed for 2 more units. She will be transfused those also. Patient will be admitted to the ICU. CODE STATUS is full at this time.         Juliet Rude. Rubin Payor, MD 06/07/12 [phone]

## 2012-06-06 NOTE — ED Notes (Signed)
Blood received from lab and at bedisde

## 2012-06-06 NOTE — ED Provider Notes (Signed)
History     CSN: [id number]  Arrival date & time 06/06/12  1736   First MD Initiated Contact with Patient 06/06/12 1916      Chief Complaint  Patient presents with  . Pressure Ulcer    (Consider location/radiation/quality/duration/timing/severity/associated sxs/prior treatment) Patient is a 51 y.o. female presenting with weakness. The history is provided by a relative and the spouse.  Weakness  Additional symptoms include weakness. Associated symptoms comments: History is provided by patient's husband and daughter. She has been physically debilitated for years with diagnosis of fibromyalgia and "undiagnosed MS". She is sometimes ambulatory and verbal but not talking, walking or moving significantly in 3 days. She has a chronic sacral ulcer that is cared for by husband and he reports malodor for 2-3 days. The patient is not eating or drinking in the last 24 hours. No reported weight loss..    Past Medical History  Diagnosis Date  . MS (multiple sclerosis)   . IBS (irritable bowel syndrome)   . PTSD (post-traumatic stress disorder)   . Fibromyalgia syndrome   . TMJ disease     Past Surgical History  Procedure Date  . Cesarean section   . Bunionectomy     No family history on file.  History  Substance Use Topics  . Smoking status: Never Smoker   . Smokeless tobacco: Not on file  . Alcohol Use: No    OB History    Grav Para Term Preterm Abortions TAB SAB Ect Mult Living                  Review of Systems  Unable to perform ROS Neurological: Positive for weakness.    Allergies  Scopolamine and Ivp dye  Home Medications  No current outpatient prescriptions on file.  BP 88/69  Pulse 49  Resp 17  Ht 5\' 6"  (1.676 m)  Wt 102 lb (46.267 kg)  BMI 16.46 kg/m2  SpO2 100%  Physical Exam  Constitutional:       Patient is extremely cachectic, underweight, pale.   HENT:  Head: Atraumatic.  Mouth/Throat: Mucous membranes are dry.  Neck: Normal range of motion.   Cardiovascular: Normal rate and regular rhythm.   Pulmonary/Chest:       Normal rate. Distant breath sounds.  Neurological:       The patient responds only to painful stimuli.  Skin: Skin is dry.       Malodorous sacral ulcer with minimal redness or significant purulent drainage.     ED Course  Procedures (including critical care time)   Labs Reviewed  CBC WITH DIFFERENTIAL  COMPREHENSIVE METABOLIC PANEL  URINALYSIS, ROUTINE W REFLEX MICROSCOPIC  URINE CULTURE  LACTIC ACID, PLASMA  CULTURE, BLOOD (ROUTINE X 2)  CULTURE, BLOOD (ROUTINE X 2)   No results found.   No diagnosis found.    MDM  Family reports patient refuses regular medical treatment and has not been to a physician in 2 years. Anticipate admission. Hypotensive, hypothermic (beir hugger applied). IV antibiotics ordered, warmed fluids, foley catheter warm fluid irrigation.         Rodena Medin, PA-C 06/06/12 2038

## 2012-06-06 NOTE — ED Notes (Signed)
Unsuccessfully attempted to obtain blood for labs x2.RN Eden Emms made aware.main lab phlebotomist will attempt

## 2012-06-06 NOTE — ED Notes (Signed)
CRITICAL VALUE ALERT  Critical value received:  Hemoglobin 3.1 Date of notification: 06/06/2012 Time of notification: 2138 Critical value read back:yes Nurse who received alert:  Kai Levins, RN MD notified (1st page): Dr. Rubin Payor Time of notification:  2140

## 2012-06-06 NOTE — ED Notes (Signed)
MD made aware of critical lab results  

## 2012-06-06 NOTE — ED Notes (Addendum)
Glucose 37 from lab - [facility].

## 2012-06-06 NOTE — ED Notes (Signed)
Rectal temperature is 86.18F first try, 85.30F second.

## 2012-06-07 ENCOUNTER — Encounter (HOSPITAL_COMMUNITY): Payer: Self-pay

## 2012-06-07 ENCOUNTER — Emergency Department (HOSPITAL_COMMUNITY): Payer: Managed Care, Other (non HMO)

## 2012-06-07 DIAGNOSIS — K7200 Acute and subacute hepatic failure without coma: Secondary | ICD-10-CM | POA: Diagnosis present

## 2012-06-07 DIAGNOSIS — L89109 Pressure ulcer of unspecified part of back, unspecified stage: Secondary | ICD-10-CM

## 2012-06-07 DIAGNOSIS — D696 Thrombocytopenia, unspecified: Secondary | ICD-10-CM | POA: Diagnosis present

## 2012-06-07 DIAGNOSIS — G709 Myoneural disorder, unspecified: Secondary | ICD-10-CM | POA: Diagnosis present

## 2012-06-07 DIAGNOSIS — R739 Hyperglycemia, unspecified: Secondary | ICD-10-CM

## 2012-06-07 DIAGNOSIS — J189 Pneumonia, unspecified organism: Secondary | ICD-10-CM

## 2012-06-07 DIAGNOSIS — T68XXXA Hypothermia, initial encounter: Secondary | ICD-10-CM

## 2012-06-07 DIAGNOSIS — G9340 Encephalopathy, unspecified: Secondary | ICD-10-CM

## 2012-06-07 DIAGNOSIS — E861 Hypovolemia: Secondary | ICD-10-CM

## 2012-06-07 DIAGNOSIS — A419 Sepsis, unspecified organism: Secondary | ICD-10-CM | POA: Insufficient documentation

## 2012-06-07 DIAGNOSIS — D689 Coagulation defect, unspecified: Secondary | ICD-10-CM | POA: Diagnosis present

## 2012-06-07 DIAGNOSIS — R001 Bradycardia, unspecified: Secondary | ICD-10-CM

## 2012-06-07 DIAGNOSIS — R0989 Other specified symptoms and signs involving the circulatory and respiratory systems: Secondary | ICD-10-CM

## 2012-06-07 DIAGNOSIS — R6521 Severe sepsis with septic shock: Secondary | ICD-10-CM | POA: Insufficient documentation

## 2012-06-07 DIAGNOSIS — D649 Anemia, unspecified: Secondary | ICD-10-CM | POA: Diagnosis present

## 2012-06-07 DIAGNOSIS — J9600 Acute respiratory failure, unspecified whether with hypoxia or hypercapnia: Principal | ICD-10-CM

## 2012-06-07 DIAGNOSIS — E162 Hypoglycemia, unspecified: Secondary | ICD-10-CM

## 2012-06-07 LAB — BLOOD GAS, ARTERIAL
Acid-base deficit: 3.8 mmol/L — ABNORMAL HIGH (ref 0.0–2.0)
Bicarbonate: 20.5 mEq/L (ref 20.0–24.0)
Drawn by: 11249
FIO2: 0.6 %
MECHVT: 400 mL
O2 Saturation: 99.7 %
PEEP: 5 cmH2O
Patient temperature: 98.6
RATE: 14 resp/min
TCO2: 19.7 mmol/L (ref 0–100)
pCO2 arterial: 36.7 mmHg (ref 35.0–45.0)
pH, Arterial: 7.367 (ref 7.350–7.450)
pO2, Arterial: 120 mmHg — ABNORMAL HIGH (ref 80.0–100.0)

## 2012-06-07 LAB — GLUCOSE, CAPILLARY
Glucose-Capillary: 141 mg/dL — ABNORMAL HIGH (ref 70–99)
Glucose-Capillary: 113 mg/dL — ABNORMAL HIGH (ref 70–99)
Glucose-Capillary: 71 mg/dL (ref 70–99)
Glucose-Capillary: 89 mg/dL (ref 70–99)

## 2012-06-07 LAB — TSH: TSH: 5.672 u[IU]/mL — ABNORMAL HIGH (ref 0.350–4.500)

## 2012-06-07 LAB — PHOSPHORUS: Phosphorus: 5.8 mg/dL — ABNORMAL HIGH (ref 2.3–4.6)

## 2012-06-07 LAB — TECHNOLOGIST SMEAR REVIEW: Tech Review: INCREASED

## 2012-06-07 LAB — LACTIC ACID, PLASMA
Lactic Acid, Venous: 1.9 mmol/L (ref 0.5–2.2)
Lactic Acid, Venous: 3 mmol/L — ABNORMAL HIGH (ref 0.5–2.2)

## 2012-06-07 LAB — CORTISOL: Cortisol, Plasma: 40.7 ug/dL

## 2012-06-07 LAB — ACETAMINOPHEN LEVEL: Acetaminophen (Tylenol), Serum: <15 ug/mL (ref 10–30)

## 2012-06-07 LAB — CBC
HCT: 34.6 % — ABNORMAL LOW (ref 36.0–46.0)
Hemoglobin: 11.7 g/dL — ABNORMAL LOW (ref 12.0–15.0)
MCH: 26.3 pg (ref 26.0–34.0)
MCHC: 33.8 g/dL (ref 30.0–36.0)
MCV: 77.8 fL — ABNORMAL LOW (ref 78.0–100.0)
Platelets: 30 10*3/uL — ABNORMAL LOW (ref 150–400)
RBC: 4.45 MIL/uL (ref 3.87–5.11)
RDW: 19.9 % — ABNORMAL HIGH (ref 11.5–15.5)
WBC: 24.6 10*3/uL — ABNORMAL HIGH (ref 4.0–10.5)

## 2012-06-07 LAB — FACTOR 8 ASSAY: Coagulation Factor VIII: 527 % — ABNORMAL HIGH (ref 73–140)

## 2012-06-07 LAB — PREALBUMIN: Prealbumin: 3 mg/dL — ABNORMAL LOW (ref 17.0–34.0)

## 2012-06-07 LAB — MRSA PCR SCREENING: MRSA by PCR: NEGATIVE

## 2012-06-07 LAB — MAGNESIUM: Magnesium: 2.2 mg/dL (ref 1.5–2.5)

## 2012-06-07 LAB — PROCALCITONIN: Procalcitonin: 0.57 ng/mL

## 2012-06-07 LAB — T3: T3, Total: 15.7 ng/dl — ABNORMAL LOW (ref 80.0–204.0)

## 2012-06-07 LAB — T4, FREE: Free T4: 0.8 ng/dL (ref 0.80–1.80)

## 2012-06-07 LAB — HAPTOGLOBIN: Haptoglobin: 32 mg/dL — ABNORMAL LOW (ref 45–215)

## 2012-06-07 MED ORDER — SODIUM CHLORIDE 0.9 % IV SOLN
1.0000 mg/h | INTRAVENOUS | Status: DC
  Filled 2012-06-07 (×2): qty 10

## 2012-06-07 MED ORDER — COLLAGENASE 250 UNIT/GM EX OINT
TOPICAL_OINTMENT | Freq: Every day | CUTANEOUS | Status: DC
  Administered 2012-06-07 – 2012-06-20 (×12): via TOPICAL
  Filled 2012-06-07 (×2): qty 30

## 2012-06-07 MED ORDER — SODIUM CHLORIDE 0.9 % IV BOLUS (SEPSIS): 1000.0000 mL | Freq: Once | INTRAVENOUS | Status: DC

## 2012-06-07 MED ORDER — PIPERACILLIN-TAZOBACTAM 3.375 G IVPB
3.3750 g | Freq: Three times a day (TID) | INTRAVENOUS | Status: DC
  Administered 2012-06-07 – 2012-06-08 (×4): 3.375 g via INTRAVENOUS
  Filled 2012-06-07 (×6): qty 50

## 2012-06-07 MED ORDER — BIOTENE DRY MOUTH MT LIQD
1.0000 "application " | Freq: Four times a day (QID) | OROMUCOSAL | Status: DC
  Administered 2012-06-07 – 2012-06-20 (×45): 15 mL via OROMUCOSAL

## 2012-06-07 MED ORDER — SODIUM CHLORIDE 0.9 % IV BOLUS (SEPSIS): 500.0000 mL | Freq: Once | INTRAVENOUS | Status: DC

## 2012-06-07 MED ORDER — SODIUM CHLORIDE 0.9 % IV SOLN: INTRAVENOUS | Status: DC

## 2012-06-07 MED ORDER — PNEUMOCOCCAL VAC POLYVALENT 25 MCG/0.5ML IJ INJ
0.5000 mL | INJECTION | INTRAMUSCULAR | Status: AC
  Filled 2012-06-07: qty 0.5

## 2012-06-07 MED ORDER — NOREPINEPHRINE BITARTRATE 1 MG/ML IJ SOLN
2.0000 ug/min | INTRAVENOUS | Status: DC
  Administered 2012-06-08: 15 ug/min via INTRAVENOUS
  Administered 2012-06-08: 17 ug/min via INTRAVENOUS
  Administered 2012-06-09: 8 ug/min via INTRAVENOUS
  Administered 2012-06-10: 8.96 ug/min via INTRAVENOUS
  Administered 2012-06-13: 10 ug/min via INTRAVENOUS
  Administered 2012-06-13: 4 ug/min via INTRAVENOUS
  Administered 2012-06-14: 5 ug/min via INTRAVENOUS
  Administered 2012-06-16: 2 ug/min via INTRAVENOUS
  Filled 2012-06-07 (×8): qty 16

## 2012-06-07 MED ORDER — DEXTROSE-NACL 5-0.45 % IV SOLN
INTRAVENOUS | Status: DC
  Administered 2012-06-07: 03:00:00 via INTRAVENOUS
  Administered 2012-06-09: 20 mL via INTRAVENOUS

## 2012-06-07 MED ORDER — SODIUM CHLORIDE 0.9 % IV SOLN
25.0000 ug/h | INTRAVENOUS | Status: DC
  Administered 2012-06-07: 50 ug/h via INTRAVENOUS
  Administered 2012-06-09: 100 ug/h via INTRAVENOUS
  Administered 2012-06-09: 75 ug/h via INTRAVENOUS
  Administered 2012-06-09: 50 ug/h via INTRAVENOUS
  Administered 2012-06-10: 100 ug/h via INTRAVENOUS
  Filled 2012-06-07 (×4): qty 50

## 2012-06-07 MED ORDER — CHLORHEXIDINE GLUCONATE 0.12 % MT SOLN
15.0000 mL | Freq: Two times a day (BID) | OROMUCOSAL | Status: DC
  Administered 2012-06-07 – 2012-06-20 (×27): 15 mL via OROMUCOSAL
  Filled 2012-06-07 (×27): qty 15

## 2012-06-07 MED ORDER — DOPAMINE-DEXTROSE 3.2-5 MG/ML-% IV SOLN
2.0000 ug/kg/min | INTRAVENOUS | Status: DC
  Administered 2012-06-07: 10 ug/kg/min via INTRAVENOUS
  Filled 2012-06-07: qty 250

## 2012-06-07 MED ORDER — OSMOLITE 1.2 CAL PO LIQD
1000.0000 mL | ORAL | Status: DC
  Administered 2012-06-07 – 2012-06-08 (×2): 1000 mL

## 2012-06-07 MED ORDER — LEVOFLOXACIN IN D5W 750 MG/150ML IV SOLN
750.0000 mg | Freq: Every day | INTRAVENOUS | Status: AC
  Administered 2012-06-07 – 2012-06-16 (×11): 750 mg via INTRAVENOUS
  Filled 2012-06-07 (×11): qty 150

## 2012-06-07 MED ORDER — INFLUENZA VIRUS VACC SPLIT PF IM SUSP
0.5000 mL | INTRAMUSCULAR | Status: AC
  Filled 2012-06-07: qty 0.5

## 2012-06-07 MED ORDER — NOREPINEPHRINE BITARTRATE 1 MG/ML IJ SOLN
2.0000 ug/min | INTRAVENOUS | Status: DC
  Administered 2012-06-07: 20 ug/min via INTRAVENOUS
  Administered 2012-06-07: 5 ug/min via INTRAVENOUS
  Administered 2012-06-07: 20 ug/min via INTRAVENOUS
  Filled 2012-06-07 (×4): qty 4

## 2012-06-07 MED ORDER — ADULT MULTIVITAMIN LIQUID CH
5.0000 mL | Freq: Every day | ORAL | Status: DC
  Administered 2012-06-08 – 2012-06-10 (×3): 5 mL
  Filled 2012-06-07 (×3): qty 5

## 2012-06-07 MED ORDER — PANTOPRAZOLE SODIUM 40 MG IV SOLR
40.0000 mg | Freq: Every day | INTRAVENOUS | Status: DC
  Administered 2012-06-07 – 2012-06-11 (×6): 40 mg via INTRAVENOUS
  Filled 2012-06-07 (×9): qty 40

## 2012-06-07 MED ORDER — VANCOMYCIN HCL IN DEXTROSE 1-5 GM/200ML-% IV SOLN
1000.0000 mg | Freq: Every day | INTRAVENOUS | Status: DC
  Administered 2012-06-07 – 2012-06-08 (×2): 1000 mg via INTRAVENOUS
  Filled 2012-06-07 (×2): qty 200

## 2012-06-07 NOTE — ED Notes (Signed)
Melvenia Beam, PA notified about rectal temperature. Bair Hugger at bedside taken from ICU. Fluid warmer at bedside.

## 2012-06-07 NOTE — Progress Notes (Signed)
VASCULAR LAB PRELIMINARY  ARTERIAL  ABI completed:    RIGHT    LEFT    PRESSURE WAVEFORM  PRESSURE WAVEFORM  BRACHIAL  T BRACHIAL 94 T  DP 78 DM DP 95 DM  AT   AT    PT 91 DM PT    PER   PER 75 DM  GREAT TOE  NA GREAT TOE  NA    RIGHT LEFT  ABI 0.97 >1.0     Marion Rosenberry, 06/07/2012, 1:54 PM

## 2012-06-07 NOTE — ED Provider Notes (Signed)
Medical screening examination/treatment/procedure(s) were conducted as a shared visit with non-physician practitioner(s) and myself.  I personally evaluated the patient during the encounter  [name]. Rubin Payor, MD 06/07/12 [phone]

## 2012-06-07 NOTE — Consult Note (Signed)
NEURO HOSPITALIST CONSULT NOTE    Reason for Consult: prognosis  HPI:                                                                                                                                          [name] is an 51 y.o. female who has seen Dr. Jacques Navy of [facility] for over six years.  Last office visit being 2 years ago.  Family states she had multiple tests obtained looking for source of weakness and spasticity.  MRI's obtained never showed any MS plaques and (per husband) EMG/NCV tests were negative. Patient at one point was offered a LP but she refused.  Per husband, patient has refused multiple tests in the past.  No formal diagnosis of MS has been given to patient. Looking at notes from Dr. Jacques Navy patient has been seen by other physicians in past including Rheumatologist to further investigate possibility of MS.  Again these work ups have also been negative. EEG's, EMG's and MRI have all been normal. Patient does have a family history of atypical MS in her half brother and aunt.   Patient was brought to hospital due to a 1 month history of mobility decline. One month ago patient was apparently mobile and able to walk with 2 person assistance and shuffled gait. Over past month she has been non-mobile, over past 2-3 days husband states she has developed bilateral LE flexion contractures, inability to move bilateral ankles due to swelling.  Although patient has been on liquid diet for multiple ears (due to TMJ) over past few days he noted she was choking on liquids and having difficulty keeping her head held up. In addition she has a stage 3 sacral ulcer which has become infected.   Past Medical History  Diagnosis Date  . MS (multiple sclerosis)   . IBS (irritable bowel syndrome)   . PTSD (post-traumatic stress disorder)   . Fibromyalgia syndrome   . TMJ disease   . Difficult intubation     secondary to jaw muscles  . Fibromyalgia   . GERD  (gastroesophageal reflux disease)     Past Surgical History  Procedure Date  . Cesarean section   . Bunionectomy   . Dilation and curettage of uterus 2007  . Tubal ligation     History reviewed. No pertinent family history.  Family History: Aunt with similar phenotype,   Social History:  reports that she has never smoked. She has never used smokeless tobacco. She reports that she does not drink alcohol or use illicit drugs.  Allergies  Allergen Reactions  . Scopolamine Anaphylaxis and Shortness Of Breath    Respiratory distress  . Ivp Dye (Iodinated Diagnostic Agents) Other (See Comments)    unknown    MEDICATIONS:  Scheduled:   . antiseptic oral rinse  1 application Mouth Rinse QID  . chlorhexidine  15 mL Mouth/Throat BID  . [COMPLETED] dextrose 5 % and 0.45 % NaCl with KCl 20 mEq/L   Intravenous Once  . [COMPLETED] dextrose  1 ampule Intravenous Once  . [COMPLETED] DOPamine  2-20 mcg/kg/min Intravenous Once  . [COMPLETED] etomidate      . feeding supplement (OSMOLITE 1.2 CAL)  1,000 mL Per Tube Q24H  . [COMPLETED] fentaNYL      . influenza  inactive virus vaccine  0.5 mL Intramuscular Tomorrow-1000  . levofloxacin (LEVAQUIN) IV  750 mg Intravenous QHS  . [COMPLETED] midazolam      . multivitamin  5 mL Per Tube Daily  . pantoprazole (PROTONIX) IV  40 mg Intravenous QHS  . [COMPLETED] piperacillin-tazobactam (ZOSYN)  IV  3.375 g Intravenous Once  . piperacillin-tazobactam (ZOSYN)  IV  3.375 g Intravenous Q8H  . pneumococcal 23 valent vaccine  0.5 mL Intramuscular Tomorrow-1000  . [COMPLETED] sodium chloride  1,000 mL Intravenous Once  . [COMPLETED] sodium chloride  1,000 mL Intravenous Once  . sodium chloride  1,000 mL Intravenous Once  . [COMPLETED] vancomycin  1,000 mg Intravenous Once  . vancomycin  1,000 mg Intravenous QHS  . [DISCONTINUED] sodium  chloride  500 mL Intravenous Once     ROS:                                                                                                                                       History obtained from husband  General ROS: negative for - chills, fatigue, fever, night sweats, weight gain or weight loss Psychological ROS: negative for - behavioral disorder, hallucinations, memory difficulties, mood swings or suicidal ideation Ophthalmic ROS: negative for - blurry vision, double vision, eye pain or loss of vision ENT ROS: negative for - epistaxis, nasal discharge, oral lesions, sore throat, tinnitus or vertigo Allergy and Immunology ROS: negative for - hives or itchy/watery eyes Hematological and Lymphatic ROS: negative for - bleeding problems, bruising or swollen lymph nodes Endocrine ROS: negative for - galactorrhea, hair pattern changes, polydipsia/polyuria or temperature intolerance Respiratory ROS: negative for - cough, hemoptysis, shortness of breath or wheezing Cardiovascular ROS: negative for - chest pain, dyspnea on exertion, edema or irregular heartbeat Gastrointestinal ROS: negative for - abdominal pain, diarrhea, hematemesis, nausea/vomiting or stool incontinence Genito-Urinary ROS: negative for - dysuria, hematuria, incontinence or urinary frequency/urgency Musculoskeletal ROS: negative for - joint swelling or muscular weakness Neurological ROS: as noted in HPI Dermatological ROS: negative for rash and skin lesion changes   Blood pressure 92/60, pulse 88, temperature 96.3 F (35.7 C), resp. rate 14, height 5\' 6"  (1.676 m), weight 44.7 kg (98 lb 8.7 oz), SpO2 100.00%.   Neurologic Examination:  Mental Status: Intubated and drowsy. She does not follow commands, but when her daugheter  Cranial Nerves: II: Discs difficult to visualize; blinks to threat and will follow my flash light,  pupils equal, round, reactive to light and accommodation III,IV, VI: ptosis not present, extra-ocular motions intact bilaterally V,VII: smile symmetric, facial light touch sensation normal bilaterally VIII: hearing normal bilaterally--looks to voice IX,X: gag reflex present XI: bilateral shoulder shrug XII: midline tongue extension Motor: She does not follow commands for formal strength testing.  Shows 2/5 strength bilateral UE at bicep and tricep. Trace movement in bilateral quadriceps, flexion contracture of bilateral hamstrings, bilateral ankles are very edematous and show no movement.  Trace toe movement in right foot. Tone is increased in all four extremities.  Sensory: responds to noxious stim x 4.  Deep Tendon Reflexes: 2+ and symmetric throughout UE, 2+ bilateral KJ and no AJ Plantars: Mute bilaterally Cerebellar/gait-- Unable to test due to noncompliance.    CV: pulses palpable throughout     No results found for this basename: cbc, bmp, coags, chol, tri, ldl, hga1c    Results for orders placed during the hospital encounter of 06/06/12 (from the past 48 hour(s))  CBC WITH DIFFERENTIAL     Status: Abnormal   Collection Time   06/06/12  9:05 PM      Component Value Range Comment   WBC 32.0 (*) 4.0 - 10.5 K/uL    RBC 1.72 (*) 3.87 - 5.11 MIL/uL    Hemoglobin 3.1 (*) 12.0 - 15.0 g/dL    HCT 45.4 (*) 09.8 - 46.0 %    MCV 69.2 (*) 78.0 - 100.0 fL    MCH 18.0 (*) 26.0 - 34.0 pg    MCHC 26.1 (*) 30.0 - 36.0 g/dL    RDW 11.9 (*) 14.7 - 15.5 %    Platelets 57 (*) 150 - 400 K/uL    Neutrophils Relative 99 (*) 43 - 77 %    Lymphocytes Relative 0 (*) 12 - 46 %    Monocytes Relative 1 (*) 3 - 12 %    Eosinophils Relative 0  0 - 5 %    Basophils Relative 0  0 - 1 %    Neutro Abs 31.7 (*) 1.7 - 7.7 K/uL    Lymphs Abs 0.0 (*) 0.7 - 4.0 K/uL    Monocytes Absolute 0.3  0.1 - 1.0 K/uL    Eosinophils Absolute 0.0  0.0 - 0.7 K/uL    Basophils Absolute 0.0  0.0 - 0.1 K/uL    RBC  Morphology RARE NRBCs      WBC Morphology MILD LEFT SHIFT (1-5% METAS, OCC MYELO, OCC BANDS)   DOHLE BODIES   Smear Review PLATELET CLUMPS NOTED ON SMEAR   LARGE PLATELETS PRESENT  COMPREHENSIVE METABOLIC PANEL     Status: Abnormal   Collection Time   06/06/12  9:05 PM      Component Value Range Comment   Sodium 143  135 - 145 mEq/L    Potassium 4.1  3.5 - 5.1 mEq/L    Chloride 109  96 - 112 mEq/L    CO2 24  19 - 32 mEq/L    Glucose, Bld 36 (*) 70 - 99 mg/dL    BUN 34 (*) 6 - 23 mg/dL    Creatinine, Ser 8.29  0.50 - 1.10 mg/dL    Calcium 7.1 (*) 8.4 - 10.5 mg/dL    Total Protein 4.2 (*) 6.0 - 8.3 g/dL    Albumin 1.7 (*)  3.5 - 5.2 g/dL    AST 956 (*) 0 - 37 U/L    ALT 78 (*) 0 - 35 U/L    Alkaline Phosphatase 199 (*) 39 - 117 U/L    Total Bilirubin 0.8  0.3 - 1.2 mg/dL    GFR calc non Af Amer 81 (*) >90 mL/min    GFR calc Af Amer >90  >90 mL/min   LACTIC ACID, PLASMA     Status: Normal   Collection Time   06/06/12  9:05 PM      Component Value Range Comment   Lactic Acid, Venous 1.3  0.5 - 2.2 mmol/L   TROPONIN I     Status: Normal   Collection Time   06/06/12  9:05 PM      Component Value Range Comment   Troponin I <0.30  <0.30 ng/mL   DIC (DISSEMINATED INTRAVASCULAR COAGULATION) PANEL     Status: Abnormal   Collection Time   06/06/12  9:05 PM      Component Value Range Comment   Prothrombin Time 26.6 (*) 11.6 - 15.2 seconds    INR 2.60 (*) 0.00 - 1.49    aPTT 69 (*) 24 - 37 seconds    Fibrinogen 153 (*) 204 - 475 mg/dL    D-Dimer, Quant 2.13 (*) 0.00 - 0.48 ug/mL-FEU    Platelets 57 (*) 150 - 400 K/uL    Smear Review SCHISTOCYTES NOTED ON SMEAR     TSH     Status: Abnormal   Collection Time   06/06/12  9:05 PM      Component Value Range Comment   TSH 5.672 (*) 0.350 - 4.500 uIU/mL   T4, FREE     Status: Normal   Collection Time   06/06/12  9:05 PM      Component Value Range Comment   Free T4 0.80  0.80 - 1.80 ng/dL   T3     Status: Abnormal   Collection Time    06/06/12  9:05 PM      Component Value Range Comment   T3, Total 15.7 (*) 80.0 - 204.0 ng/dl   PREALBUMIN     Status: Abnormal   Collection Time   06/06/12  9:05 PM      Component Value Range Comment   Prealbumin 3.0 (*) 17.0 - 34.0 mg/dL   URINALYSIS, ROUTINE W REFLEX MICROSCOPIC     Status: Abnormal   Collection Time   06/06/12  9:48 PM      Component Value Range Comment   Color, Urine ORANGE (*) YELLOW BIOCHEMICALS MAY BE AFFECTED BY COLOR   APPearance TURBID (*) CLEAR    Specific Gravity, Urine 1.019  1.005 - 1.030    pH 5.0  5.0 - 8.0    Glucose, UA NEGATIVE  NEGATIVE mg/dL    Hgb urine dipstick MODERATE (*) NEGATIVE    Bilirubin Urine MODERATE (*) NEGATIVE    Ketones, ur NEGATIVE  NEGATIVE mg/dL    Protein, ur 30 (*) NEGATIVE mg/dL    Urobilinogen, UA 2.0 (*) 0.0 - 1.0 mg/dL    Nitrite NEGATIVE  NEGATIVE    Leukocytes, UA SMALL (*) NEGATIVE   URINE MICROSCOPIC-ADD ON     Status: Abnormal   Collection Time   06/06/12  9:48 PM      Component Value Range Comment   Squamous Epithelial / LPF FEW (*) RARE    WBC, UA 3-6  <3 WBC/hpf    RBC / HPF 3-6  <3  RBC/hpf    Bacteria, UA FEW (*) RARE    Casts HYALINE CASTS (*) NEGATIVE    Urine-Other LESS THAN 10 mL OF URINE SUBMITTED     TYPE AND SCREEN     Status: Normal (Preliminary result)   Collection Time   06/06/12  9:50 PM      Component Value Range Comment   ABO/RH(D) A POS      Antibody Screen NEG      Sample Expiration 06/09/2012      Unit Number [id number]      Blood Component Type RED CELLS,LR      Unit division 00      Status of Unit ISSUED,FINAL      Transfusion Status OK TO TRANSFUSE      Crossmatch Result COMPATIBLE      Unit Number [id number]      Blood Component Type RED CELLS,LR      Unit division 00      Status of Unit ISSUED,FINAL      Transfusion Status OK TO TRANSFUSE      Crossmatch Result COMPATIBLE      Unit Number [id number]      Blood Component Type RED CELLS,LR      Unit division 00       Status of Unit ISSUED      Transfusion Status OK TO TRANSFUSE      Crossmatch Result Compatible      Unit Number [id number]      Blood Component Type RED CELLS,LR      Unit division 00      Status of Unit ISSUED      Transfusion Status OK TO TRANSFUSE      Crossmatch Result Compatible     ABO/RH     Status: Normal   Collection Time   06/06/12  9:50 PM      Component Value Range Comment   ABO/RH(D) A POS     PREPARE RBC (CROSSMATCH)     Status: Normal   Collection Time   06/06/12 10:00 PM      Component Value Range Comment   Order Confirmation ORDER PROCESSED BY BLOOD BANK     GLUCOSE, CAPILLARY     Status: Abnormal   Collection Time   06/06/12 10:36 PM      Component Value Range Comment   Glucose-Capillary 194 (*) 70 - 99 mg/dL    Comment 1 Notify RN      Comment 2 Documented in Chart     CBC WITH DIFFERENTIAL     Status: Abnormal   Collection Time   06/06/12 10:40 PM      Component Value Range Comment   WBC 29.5 (*) 4.0 - 10.5 K/uL    RBC 1.50 (*) 3.87 - 5.11 MIL/uL    Hemoglobin 2.8 (*) 12.0 - 15.0 g/dL    HCT 72.5 (*) 36.6 - 46.0 %    MCV 68.7 (*) 78.0 - 100.0 fL    MCH 18.7 (*) 26.0 - 34.0 pg    MCHC 27.2 (*) 30.0 - 36.0 g/dL    RDW 44.0 (*) 34.7 - 15.5 %    Platelets 50 (*) 150 - 400 K/uL    Neutrophils Relative 98 (*) 43 - 77 %    Lymphocytes Relative 1 (*) 12 - 46 %    Monocytes Relative 1 (*) 3 - 12 %    Eosinophils Relative 0  0 - 5 %    Basophils Relative 0  0 -  1 %    Neutro Abs 28.9 (*) 1.7 - 7.7 K/uL    Lymphs Abs 0.3 (*) 0.7 - 4.0 K/uL    Monocytes Absolute 0.3  0.1 - 1.0 K/uL    Eosinophils Absolute 0.0  0.0 - 0.7 K/uL    Basophils Absolute 0.0  0.0 - 0.1 K/uL    RBC Morphology RARE NRBCs      WBC Morphology MILD LEFT SHIFT (1-5% METAS, OCC MYELO, OCC BANDS)   DOHLE BODIES   Smear Review LARGE PLATELETS PRESENT     COMPREHENSIVE METABOLIC PANEL     Status: Abnormal   Collection Time   06/06/12 10:40 PM      Component Value Range Comment   Sodium  143  135 - 145 mEq/L    Potassium 3.6  3.5 - 5.1 mEq/L    Chloride 110  96 - 112 mEq/L    CO2 21  19 - 32 mEq/L    Glucose, Bld 184 (*) 70 - 99 mg/dL    BUN 33 (*) 6 - 23 mg/dL    Creatinine, Ser 4.09  0.50 - 1.10 mg/dL    Calcium 6.8 (*) 8.4 - 10.5 mg/dL    Total Protein 3.8 (*) 6.0 - 8.3 g/dL    Albumin 1.5 (*) 3.5 - 5.2 g/dL    AST 99 (*) 0 - 37 U/L    ALT 69 (*) 0 - 35 U/L    Alkaline Phosphatase 176 (*) 39 - 117 U/L    Total Bilirubin 0.7  0.3 - 1.2 mg/dL    GFR calc non Af Amer 80 (*) >90 mL/min    GFR calc Af Amer >90  >90 mL/min   LACTIC ACID, PLASMA     Status: Abnormal   Collection Time   06/06/12 10:40 PM      Component Value Range Comment   Lactic Acid, Venous 2.3 (*) 0.5 - 2.2 mmol/L   LACTATE DEHYDROGENASE     Status: Abnormal   Collection Time   06/06/12 10:40 PM      Component Value Range Comment   LDH 293 (*) 94 - 250 U/L   HAPTOGLOBIN     Status: Abnormal   Collection Time   06/06/12 10:40 PM      Component Value Range Comment   Haptoglobin 32 (*) 45 - 215 mg/dL   RETICULOCYTES     Status: Abnormal   Collection Time   06/06/12 10:40 PM      Component Value Range Comment   Retic Ct Pct 2.7  0.4 - 3.1 %    RBC. 1.50 (*) 3.87 - 5.11 MIL/uL    Retic Count, Manual 40.5  19.0 - 186.0 K/uL   BLOOD GAS, ARTERIAL     Status: Abnormal   Collection Time   06/07/12 12:35 AM      Component Value Range Comment   FIO2 0.60      Delivery systems VENTILATOR      Mode PRESSURE REGULATED VOLUME CONTROL      VT 400      Rate 14      Peep/cpap 5.0      pH, Arterial 7.367  7.350 - 7.450    pCO2 arterial 36.7  35.0 - 45.0 mmHg    pO2, Arterial 120.0 (*) 80.0 - 100.0 mmHg    Bicarbonate 20.5  20.0 - 24.0 mEq/L    TCO2 19.7  0 - 100 mmol/L    Acid-base deficit 3.8 (*) 0.0 - 2.0 mmol/L  O2 Saturation 99.7      Patient temperature 98.6      Collection site LEFT RADIAL      Drawn by [phone]      Sample type ARTERIAL DRAW      Allens test (pass/fail) PASS  PASS   LACTIC  ACID, PLASMA     Status: Normal   Collection Time   06/07/12  1:25 AM      Component Value Range Comment   Lactic Acid, Venous 1.9  0.5 - 2.2 mmol/L   CORTISOL     Status: Normal   Collection Time   06/07/12  1:25 AM      Component Value Range Comment   Cortisol, Plasma 40.7     ACETAMINOPHEN LEVEL     Status: Normal   Collection Time   06/07/12  1:25 AM      Component Value Range Comment   Acetaminophen (Tylenol), Serum <15.0  10 - 30 ug/mL   FACTOR 8 ASSAY     Status: Abnormal   Collection Time   06/07/12  1:25 AM      Component Value Range Comment   Coagulation Factor VIII 527 (*) 73 - 140 %   PROCALCITONIN     Status: Normal   Collection Time   06/07/12  1:25 AM      Component Value Range Comment   Procalcitonin 0.57     MAGNESIUM     Status: Normal   Collection Time   06/07/12  1:25 AM      Component Value Range Comment   Magnesium 2.2  1.5 - 2.5 mg/dL   PHOSPHORUS     Status: Abnormal   Collection Time   06/07/12  1:25 AM      Component Value Range Comment   Phosphorus 5.8 (*) 2.3 - 4.6 mg/dL   MRSA PCR SCREENING     Status: Normal   Collection Time   06/07/12  2:37 AM      Component Value Range Comment   MRSA by PCR NEGATIVE  NEGATIVE   GLUCOSE, CAPILLARY     Status: Abnormal   Collection Time   06/07/12  3:43 AM      Component Value Range Comment   Glucose-Capillary 141 (*) 70 - 99 mg/dL   CULTURE, RESPIRATORY     Status: Normal (Preliminary result)   Collection Time   06/07/12  3:55 AM      Component Value Range Comment   Specimen Description TRACHEAL ASPIRATE      Special Requests NONE      Gram Stain        Value: RARE WBC PRESENT, PREDOMINANTLY MONONUCLEAR     RARE SQUAMOUS EPITHELIAL CELLS PRESENT     NO ORGANISMS SEEN   Culture PENDING      Report Status PENDING     GLUCOSE, CAPILLARY     Status: Abnormal   Collection Time   06/07/12  7:39 AM      Component Value Range Comment   Glucose-Capillary 113 (*) 70 - 99 mg/dL    Comment 1 Documented in Chart       Comment 2 Notify RN     PREPARE FRESH FROZEN PLASMA     Status: Normal (Preliminary result)   Collection Time   06/07/12 12:00 PM      Component Value Range Comment   Unit Number [id number]      Blood Component Type THAWED PLASMA      Unit division 00  Status of Unit ALLOCATED      Transfusion Status OK TO TRANSFUSE      Unit Number [id number]      Blood Component Type THAWED PLASMA      Unit division 00      Status of Unit ALLOCATED      Transfusion Status OK TO TRANSFUSE       Dg Pelvis Portable  06/06/2012  *RADIOLOGY REPORT*  Clinical Data: Sacral pressure ulcer.  PORTABLE PELVIS  Comparison: CT of the abdomen and pelvis performed 03/27/2009  Findings: There is no evidence of fracture or dislocation.  Both femoral heads are seated normally within their respective acetabula.  No significant degenerative change is appreciated.  The sacroiliac joints are unremarkable in appearance.  The visualized bowel gas pattern is grossly unremarkable in appearance; a large amount of stool and air are seen within the mildly distended colon.  IMPRESSION: No evidence of fracture or dislocation.   Original Report Authenticated By: Tonia Ghent, M.D.    Dg Chest Portable 1 View  06/07/2012  *RADIOLOGY REPORT*  Clinical Data: Pressure ulcer.  Acute respiratory failure. Multiple sclerosis.  PORTABLE CHEST - 1 VIEW  Comparison: 06/06/2012  Findings: Endotracheal tube is now seen in place with tip approximately 4 cm above the carina. A new left subclavian center venous catheter is seen with tip overlying the superior cavoatrial junction.  No evidence of pneumothorax. New nasogastric is also seen with tip in the proximal stomach.  Mild worsening of the diffuse bilateral pulmonary air space disease is seen.  Small right pleural effusion shows no significant change. Heart size is stable allowing for patient positioning.  IMPRESSION:  1. New endotracheal tube and left subclavian center venous catheter in  appropriate position.  No evidence of pneumothorax. 2.  Mild worsening of symmetric bilateral airspace disease. 3.  Stable small right pleural effusion.   Original Report Authenticated By: Myles Rosenthal, M.D.    Dg Chest Portable 1 View  06/06/2012  *RADIOLOGY REPORT*  Clinical Data: Shortness of breath; weakness.  PORTABLE CHEST - 1 VIEW  Comparison: Chest radiograph performed 03/27/2009  Findings: There is relatively diffuse bilateral airspace opacification, with some degree of central sparing, raising suspicion for multifocal pneumonia.  A small to moderate right- sided pleural effusion is seen.  No pneumothorax is identified.  The cardiomediastinal silhouette is within normal limits.  No acute osseous abnormalities are seen.  IMPRESSION: Relatively diffuse bilateral airspace opacification raises concern for multifocal pneumonia.  Associated new small to moderate right- sided pleural effusion seen.   Original Report Authenticated By: Tonia Ghent, M.D.      Felicie Morn PA-C [facility] [phone]  06/07/2012, 12:11 PM  I have seen and evaluated the patient. I have reviewed the above note and made appropriate changes.   51 yo F with 20+ year history of progressive stiffness/spasticity and pain. She was bedridden and ADL dependant for 2 - 3years until about one year ago at which point she had a spontaneous remarkable improvement. She was walking and attending to her own ADLs until August at which point she again worsened, and particularly over the past month has had a worsening.   Assessment/Plan: 51 yo F with progressive spasticity over the past 20 years admitted with aspiration/sacral decubitius. 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 Stiff person syndrome or other autoimmune disease may be a possibility. 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 The remission happened in the setting of menopause.   At this time, she has had an extensive previous workup, and I do not feel that performing a large number of low yield tests would be beneficial at this point. An anti-GAD antibody may be reasonable.   1) One consideration given her unexplained spontaneous remission would be if a trial of immunomodulation would be beneficial, and it may be reasonable to try this, but would wait until the acute phase of her illness has passed.  2) Anti-GAD antibody.       Ritta Slot, MD [facility] [phone]  If 7pm- 7am, please page neurology on call at [phone].

## 2012-06-07 NOTE — Consult Note (Addendum)
WOC consult Note Reason for Consult: Consult requested for ischium and lower extremities. Husband is primary caregiver and states wound has only been in this location for 2 weeks, and declined this week with foul odor and drainage. Pt very emaciated and has multiple risk factors which could impair healing; low albumin, hemoglobin critically low, poor nutritional intake, hypotension. Wound type:  Right ischium with unstageable wound 6X7X4cm, 100% slough, mod amt tan, foul-smelling drainage.  Swab inserted through slough to depth of 4 cm and bone palpable. Left ischium with unstageable wound 1X1cm, 100% slough, small yellow drainage, no odor. Sacrum with unstageable wound, .8X.8cm, 80% slough, 20% red, small yellow drainage, no odor.  Right leg with red mottled skin from below knee to foot.  Patchy areas of partial thickness skin loss,including 14X5X.1cm and 5X6X.1cm. Red and moist with mod yellow drainage, no odor.Toes purple, mottled, and swollen,  especially right great toe.  Heel red but blanchable. Left leg with red mottled skin from below knee to foot.  Patchy areas of partial thickness skin loss, mod yellow drainage.Toes purple, mottled, and swollen. Heel red but blancable    Pressure Ulcer POA: All pressure ulcers were present on admission.  Dressing procedure/placement/frequency: Recommend surgical consult for right ischium R/T extensive amt non-viable tissue and palpable bone. Pt could benefit from assessemnt of BLE to R/O DVT and assess circulatory status. Discussed plan of care with primary team. Santyl ointment for chemical debridement of non-viable tissue to sacrum and left ischium.  Pt on Sport low-air loss bed.  Float heels to redistribute pressure.  Foam dressings to absorb drainage and protect legs. Husband and family members at bedside, pt unable to communicate.  Discussed etiology, preventive measures, and treatment of pressure ulcers.  Educational handout given to family. One hour  spent performing consult.  Please re-consult if further assistance is needed.  Cammie Mcgee, RN, MSN, [employer]  [phone]

## 2012-06-07 NOTE — Procedures (Signed)
Name:  [name] MRN:  [id number] DOB:  [birth year]  PROCEDURE NOTE  Procedure:  Central venous catheter placement.  Indications:  Need for intravenous access and hemodynamic monitoring.  Consent:  Consent was implied due to the emergency nature of the procedure.  Anesthesia:  A total of 10 mL of 1% Lidocaine was used for local infiltration anesthesia.  Procedure summary:  Appropriate equipment was assembled.  The patient was identified as [name] and safety timeout was performed. The patient was placed in Trendelenburg position.  Sterile technique was used. The patient's left anterior chest wall was prepped using chlorhexidine / alcohol scrub and the field was draped in usual sterile fashion with full body drape. After the adequate anesthesia was achieved, the left subclavian vein was cannulated with the introducer needle without difficulty. A guide wire was advanced through the introducer needle, which was then withdrawn. A small skin incision was made at the point of wire entry, the dilator was inserted over the guide wire and appropriate dilation was obtained. The dilator was removed and triple-lumen catheter was advanced over the guide wire, which was then removed.  All ports were aspirated and flushed with normal saline without difficulty. The catheter was secured into place. Antibiotic patch was placed and sterile dressing was applied. Post-procedure chest x-ray was ordered.  Complications:  No immediate complications were noted.  Hemodynamic parameters and oxygenation remained stable throughout the procedure.  Estimated blood loss:  Less then 5 mL.  [name], M.D. Pulmonary and Critical Care Medicine [facility] Cell: [phone]  06/07/2012, 12:25 AM

## 2012-06-07 NOTE — Progress Notes (Addendum)
Physician wrote order to change vent set RR from 15 to 14.

## 2012-06-07 NOTE — Progress Notes (Signed)
  Echocardiogram 2D Echocardiogram has been performed.  Lashawne Dura 06/07/2012, 9:27 AM

## 2012-06-07 NOTE — Progress Notes (Signed)
CARE MANAGEMENT NOTE 06/07/2012  Patient:  [name], [name]   Account Number:  [id number]  Date Initiated:  06/07/2012  Documentation initiated by:  DAVIS,RHONDA  Subjective/Objective Assessment:   pt with resp failure and sepsis on vent     Action/Plan:   has been at home with husband may need snf due to condition and safety.   Anticipated DC Date:  06/10/2012   Anticipated DC Plan:  SKILLED NURSING FACILITY  In-house referral  Clinical Social Worker      DC Planning Services  NA      [facility] Choice  NA   Choice offered to / List presented to:  NA   DME arranged  NA      DME agency  NA     HH arranged  NA      HH agency  NA   Status of service:  In process, will continue to follow Medicare Important Message given?  NA - LOS <3 / Initial given by admissions (If response is "NO", the following Medicare IM given date fields will be blank) Date Medicare IM given:   Date Additional Medicare IM given:    Discharge Disposition:    Per UR Regulation:  Reviewed for med. necessity/level of care/duration of stay  If discussed at Long Length of Stay Meetings, dates discussed:    Comments:  [id number]/Rhonda Earlene Plater, RN, BSN, CCM: CHART REVIEWED AND UPDATED.  Next chart review due on [id number]. NO DISCHARGE NEEDS PRESENT AT THIS TIME. CASE MANAGEMENT [phone]

## 2012-06-07 NOTE — Progress Notes (Signed)
Pt place on ventilator. Setting are as follows 400- 15- 60% and 5 of peep. Pt stable at this time.

## 2012-06-07 NOTE — H&P (Addendum)
PULMONARY  / CRITICAL CARE MEDICINE  Name: [name] MRN: [id number] DOB: Dec 29, 1960    LOS: 1  REFERRING MD:  EMD  CHIEF COMPLAINT:  Acute encephalopathy  BRIEF PATIENT DESCRIPTION: 51 yo with limited mobility due to neuromuscular disease (?MS) last seen by Neurologist about 2 years ago brought to [facility] ED with altered mental status.  Husband describes decreased food intake, limited mobility and generalized edema since about 3 months ago.  There has been acute change in mental status since about 3 days ago.  Husband describes aspiration of food.  Also reports worsening sacral decubitus.  In ED: hypotensive, hypoglycemic, hypothermic, bradycardic, Hb 3, WBC 30.  Intubated for airway protection.  CVL placed.  LINES / TUBES: 12/3  OETT 12/3  OGT 12/3  Foley 12/3  L [place] CVL  CULTURES: 12/3  Blood >>> 12/3  Respiratory >>> 12/3  Urine >>>  ANTIBIOTICS: Zosyn 12/3 >>> Vancomycin 12/3 >>> Levaquin 12/3 >>>  SIGNIFICANT EVENTS:  12/3  Admitted for altered mental status, sepsis, secondary to aspiration pneumonia / infected decubitus and severe anemia.  Intubated.  CVL placed.  LEVEL OF CARE:  ICU  PRIMARY SERVICE:  PCCM  CONSULTANTS:  NA  CODE STATUS:  Full  DIET:  NPO  DVT Px:  SCDs  GI Px:  Protonix  The patient is encephalopathic and unable to provide history, which was obtained for available medical records.  HISTORY OF PRESENT ILLNESS:  51 yo with limited mobility due to neuromuscular disease (?MS) last seen by Neurologist about 2 years ago brought to [facility] ED with altered mental status.  Husband describes decreased food intake, limited mobility and generalized edema since about 3 months ago.  There has been acute change in mental status since about 3 days ago.  Husband describes aspiration of food.  Also reports worsening sacral decubitus.  In ED: hypotensive, hypoglycemic, hypothermic, bradycardic, Hb 3, WBC 30.  Intubated for airway protection.  CVL placed.  PAST  MEDICAL HISTORY :  Past Medical History  Diagnosis Date  . MS (multiple sclerosis)   . IBS (irritable bowel syndrome)   . PTSD (post-traumatic stress disorder)   . Fibromyalgia syndrome   . TMJ disease    Past Surgical History  Procedure Date  . Cesarean section   . Bunionectomy    Prior to Admission medications   Not on File   Allergies  Allergen Reactions  . Scopolamine Anaphylaxis and Shortness Of Breath    Respiratory distress  . Ivp Dye (Iodinated Diagnostic Agents) Other (See Comments)    unknown    FAMILY HISTORY:  No family history on file.  SOCIAL HISTORY:  reports that she has never smoked. She does not have any smokeless tobacco history on file. She reports that she does not drink alcohol or use illicit drugs.  REVIEW OF SYSTEMS:  Unable to provide.  INTERVAL HISTORY:  VITAL SIGNS: Pulse Rate:  [49-62] 56  (12/03 0000) Resp:  [12-20] 15  (12/03 0000) BP: (54-108)/(33-81) 74/49 mmHg (12/03 0000) SpO2:  [90 %-100 %] 100 % (12/03 0000) FiO2 (%):  [60 %] 60 % (12/02 2348) Weight:  [46.267 kg (102 lb)] 46.267 kg (102 lb) (12/02 1912)  HEMODYNAMICS:   VENTILATOR SETTINGS: Vent Mode:  [-] PRVC FiO2 (%):  [60 %] 60 % Set Rate:  [15 bmp] 15 bmp Vt Set:  [400 mL] 400 mL PEEP:  [5 cmH20] 5 cmH20  INTAKE / OUTPUT: Intake/Output    None     PHYSICAL  EXAMINATION: General:  Appears acutely ill, mechanically ventilated, synchronous, cachectic Neuro:  Encephalopathic, nonfocal, cough / gag diminished HEENT:  PERRL, OETT / OGT Cardiovascular:  RRR, no m/r/g Lungs:  Bilateral diminished air entry, no w/r/r Abdomen:  Soft, nontender, bowel sounds diminished Musculoskeletal:  Moves all extremities, no edema Skin:  Large sacral decubitus  LABS:  Lab 06/06/12 2240 06/06/12 2105  HGB 2.8* 3.1*  WBC 29.5* 32.0*  PLT 50* 57*57*  NA 143 143  K 3.6 4.1  CL 110 109  CO2 21 24  GLUCOSE 184* 36*  BUN 33* 34*  CREATININE 0.84 0.83  CALCIUM 6.8* 7.1*  MG --  --  PHOS -- --  AST 99* 110*  ALT 69* 78*  ALKPHOS 176* 199*  BILITOT 0.7 0.8  PROT 3.8* 4.2*  ALBUMIN 1.5* 1.7*  APTT -- 69*  INR -- 2.60*  LATICACIDVEN 2.3* 1.3  TROPONINI -- <0.30  PROCALCITON -- --  PROBNP -- --  O2SATVEN -- --  PHART -- --  PCO2ART -- --  PO2ART -- --    Lab 06/06/12 2236  GLUCAP 194*   IMAGING:  12/3  CXR >>>  Hardware in good position, bilateral airspace disease. Pleural effusion (R).  ECG: 12/3  Sinus bradycardia  DIAGNOSES: Active Problems:  Acute encephalopathy  Sepsis  Acute respiratory failure  CAP (community acquired pneumonia)  Anemia  Thrombocytopenia  Coagulopathy  Acute liver failure  Hypothermia  Bradycardia  Hypoglycemia  ASSESSMENT / PLAN:  PULMONARY  A:  Acute respiratory failure.  CAP vs aspiration pneumonia.  Pleural effusion (R). P:   Gaol SpO2>92, pH>7.30 Full mechanical support Daily SBT Trend ABG / CXR  CARDIOVASCULAR  A: Sepsis.  Bradycardia.  Suspected cardiomyopathy. P:  Goal MAP>60, HR > 80 Dopamine gtt Trend Lactate TTE  RENAL  A:  Normal renal function / electrolytes. P:   Goal CVP>10 Trend BMP IVF D5 1/2 NS @ 100 mL/h NS 1000 mL bolus x 1, then to CVP goal Mg, Phos  GASTROINTESTINAL  A:  Severe malnutrition.  At risk for refeeding syndrome.  Liver failure (acute vs chronic). P:   NPO as intubated Nutritionist consult Pre Albumin Acetaminophen level  HEMATOLOGIC  A:  Thrombocytopenia.  Anemia.  Coagulopathy. ?DIC.  Should also consider the possibility of Hematologic malignancy. P:  Trend CBC Factor 8 level Transfused 2 units of PRBC in ED, 2 more pending Consider peripheral smear / Oncology consultation  INFECTIOUS  A:  CAP vs aspiration pneumonia.  Infected sacral decubitus. P:   Cultures and antibiotics as above PCT Wound care consult  ENDOCRINE   A:  Hypoglycemia. P:   CBG D5 Cortisol level TSH, free T4, total T3  NEUROLOGIC  A:  Baseline neuromuscular  disease, ?MS.  Acute encephalopathy.  No focal findings.  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 P:   Goal RASS 0 to -1 Fentanyl / Versed gtt Drug screen Neurology consult when stable Consider EMG / anti-VGCC Ab (voltage gated calcium channels)  CLINICAL SUMMARY: 51 yo with limited mobility due to neuromuscular disease (?MS) last seen by Neurologist about 2 years ago brought to [facility] ED with altered mental status.  Husband describes decreased food intake, limited mobility and generalized edema since about 3 months ago.  There has been acute change in mental status since about 3 days ago.  Husband describes aspiration of food.  Also reports worsening sacral decubitus.  In ED: hypotensive, hypoglycemic, hypothermic, bradycardic, Hb 3, WBC 30.  Intubated for airway protection.  CVL placed.  Tonight:  fluid, blood products, broad spectrum antibiotics.  I have personally obtained a history, examined the patient, evaluated laboratory and imaging results, formulated the assessment and plan and placed orders.  CRITICAL CARE:  The patient is critically ill with multiple organ systems failure and requires high complexity decision making for assessment and support, frequent evaluation and titration of therapies, application of advanced monitoring technologies and extensive interpretation of multiple databases. Critical Care Time devoted to patient care services described in this note is 60 minutes.   Lonia Farber, MD  Pulmonary and Critical Care Medicine [facility] Pager: [phone]  06/07/2012, 12:26 AM

## 2012-06-07 NOTE — ED Notes (Signed)
Pharmacy contacted about giving Vancomycin with fluid warmer. Pharmacist stated it was safe to give it if Vancomycin were to run in the closest port to pt so it would have less contact time with warmed fluids. Informed Alinda Money, RN.

## 2012-06-07 NOTE — Progress Notes (Signed)
ANTIBIOTIC CONSULT NOTE - INITIAL  Pharmacy Consult for vancomycin/zosyn/levofloxacin Indication: pneumonia and rule out sepsis  Allergies  Allergen Reactions  . Scopolamine Anaphylaxis and Shortness Of Breath    Respiratory distress  . Ivp Dye (Iodinated Diagnostic Agents) Other (See Comments)    unknown    Patient Measurements: Height: 5\' 6"  (167.6 cm) Weight: 98 lb 8.7 oz (44.7 kg) IBW/kg (Calculated) : 59.3  Adjusted Body Weight:   Vital Signs: Temp: 94.8 F (34.9 C) (12/03 0600) Temp src: Core (Comment) (12/02 2228) BP: 88/68 mmHg (12/03 0600) Pulse Rate: 83  (12/03 0600) Intake/Output from previous day: 12/02 0701 - 12/03 0700 In: 928.6 [I.V.:108.6; Blood:670; IV Piggyback:150] Out: 75 [Urine:75] Intake/Output from this shift: Total I/O In: 928.6 [I.V.:108.6; Blood:670; IV Piggyback:150] Out: 75 [Urine:75]  Labs:  [facility] 06/06/12 2240 06/06/12 2105  WBC 29.5* 32.0*  HGB 2.8* 3.1*  PLT 50* 57*57*  LABCREA -- --  CREATININE 0.84 0.83   Estimated Creatinine Clearance: 57.2 ml/min (by C-G formula based on Cr of 0.84). No results found for this basename: VANCOTROUGH:2,VANCOPEAK:2,VANCORANDOM:2,GENTTROUGH:2,GENTPEAK:2,GENTRANDOM:2,TOBRATROUGH:2,TOBRAPEAK:2,TOBRARND:2,AMIKACINPEAK:2,AMIKACINTROU:2,AMIKACIN:2, in the last 72 hours   Microbiology: Recent Results (from the past 720 hour(s))  MRSA PCR SCREENING     Status: Normal   Collection Time   06/07/12  2:37 AM      Component Value Range Status Comment   MRSA by PCR NEGATIVE  NEGATIVE Final     Medical History: Past Medical History  Diagnosis Date  . MS (multiple sclerosis)   . IBS (irritable bowel syndrome)   . PTSD (post-traumatic stress disorder)   . Fibromyalgia syndrome   . TMJ disease     Medications:  Anti-infectives     Start     Dose/Rate Route Frequency Ordered Stop   06/07/12 2200   vancomycin (VANCOCIN) IVPB 1000 mg/200 mL premix        1,000 mg 200 mL/hr over 60 Minutes  Intravenous Daily at bedtime 06/07/12 0041     06/07/12 0600   piperacillin-tazobactam (ZOSYN) IVPB 3.375 g        3.375 g 12.5 mL/hr over 240 Minutes Intravenous 3 times per day 06/07/12 0039     06/07/12 0045   levofloxacin (LEVAQUIN) IVPB 750 mg        750 mg 100 mL/hr over 90 Minutes Intravenous Daily at bedtime 06/07/12 0038     06/06/12 2115   piperacillin-tazobactam (ZOSYN) IVPB 3.375 g        3.375 g 12.5 mL/hr over 240 Minutes Intravenous  Once 06/06/12 2102 06/07/12 0128   06/06/12 2115   vancomycin (VANCOCIN) IVPB 1000 mg/200 mL premix        1,000 mg 200 mL/hr over 60 Minutes Intravenous  Once 06/06/12 2102 06/06/12 2226         Assessment: Patient with PNA.  First dose of zoysn/vancomycin antibiotics already given in ED.  Levofloxacin not charted as given in ED.  Sent to floor around 3am.  Goal of Therapy:  Vancomycin trough level 15-20 mcg/ml Zosyn based on renal function Levofloxacin dosed based on patient weight and renal function   Plan:  Measure antibiotic drug levels at steady state Follow up culture results Vancomycin 1gm iv q24hr, levofloxacin 750mg  iv q24hr Zosyn 3.375g IV Q8H infused over 4hrs.   [name], [name] 06/07/2012,6:10 AM

## 2012-06-07 NOTE — Progress Notes (Signed)
INITIAL ADULT NUTRITION ASSESSMENT Date: 06/07/2012   Time: 11:14 AM Reason for Assessment: consult; enteral nutrition initiation and management  INTERVENTION: 1.  Enteral nutrition; initiation of Osmolite 1.2 @ 10 mL/hr continuous via OGT.  Advance by 5 mL q 8 hrs to 35 mL/hr goal to provide 1008 kcal, 46g protein, 638 mL free water. 2.  Supplements; MVI daily   DOCUMENTATION CODES Per approved criteria  -Severe malnutrition in the context of chronic illness    ASSESSMENT: Female 51 y.o.  Dx: Acute encephalopathy  Hx:  Past Medical History  Diagnosis Date  . MS (multiple sclerosis)   . IBS (irritable bowel syndrome)   . PTSD (post-traumatic stress disorder)   . Fibromyalgia syndrome   . TMJ disease    Past Surgical History  Procedure Date  . Cesarean section   . Bunionectomy     Related Meds:  Scheduled Meds:   . antiseptic oral rinse  1 application Mouth Rinse QID  . chlorhexidine  15 mL Mouth/Throat BID  . [COMPLETED] dextrose 5 % and 0.45 % NaCl with KCl 20 mEq/L   Intravenous Once  . [COMPLETED] dextrose  1 ampule Intravenous Once  . [COMPLETED] DOPamine  2-20 mcg/kg/min Intravenous Once  . [COMPLETED] etomidate      . [COMPLETED] fentaNYL      . levofloxacin (LEVAQUIN) IV  750 mg Intravenous QHS  . [COMPLETED] midazolam      . pantoprazole (PROTONIX) IV  40 mg Intravenous QHS  . [COMPLETED] piperacillin-tazobactam (ZOSYN)  IV  3.375 g Intravenous Once  . piperacillin-tazobactam (ZOSYN)  IV  3.375 g Intravenous Q8H  . [COMPLETED] sodium chloride  1,000 mL Intravenous Once  . [COMPLETED] sodium chloride  1,000 mL Intravenous Once  . sodium chloride  1,000 mL Intravenous Once  . [COMPLETED] vancomycin  1,000 mg Intravenous Once  . vancomycin  1,000 mg Intravenous QHS  . [DISCONTINUED] sodium chloride  500 mL Intravenous Once   Continuous Infusions:   . dextrose 5 % and 0.45% NaCl 100 mL/hr at 06/07/12 0300  . DOPamine 15 mcg/kg/min (06/07/12 1000)  .  fentaNYL infusion INTRAVENOUS 50 mcg/hr (06/07/12 1101)  . midazolam (VERSED) infusion    . norepinephrine (LEVOPHED) Adult infusion 2 mcg/min (06/07/12 1000)  . [DISCONTINUED] sodium chloride     PRN Meds:.   Ht: 5\' 6"  (167.6 cm)  Wt: 98 lb 8.7 oz (44.7 kg)  Ideal Wt: 59.1 kg % Ideal Wt: 75%  Usual Wt: 105 lbs % Usual Wt: 93%  Body mass index is 15.91 kg/(m^2).  Food/Nutrition Related Hx: decreased intake x3 months, h/o anorexia nervosa.  Labs:  CMP     Component Value Date/Time   NA 143 06/06/2012 2240   K 3.6 06/06/2012 2240   CL 110 06/06/2012 2240   CO2 21 06/06/2012 2240   GLUCOSE 184* 06/06/2012 2240   BUN 33* 06/06/2012 2240   CREATININE 0.84 06/06/2012 2240   CALCIUM 6.8* 06/06/2012 2240   PROT 3.8* 06/06/2012 2240   ALBUMIN 1.5* 06/06/2012 2240   AST 99* 06/06/2012 2240   ALT 69* 06/06/2012 2240   ALKPHOS 176* 06/06/2012 2240   BILITOT 0.7 06/06/2012 2240   GFRNONAA 80* 06/06/2012 2240   GFRAA >90 06/06/2012 2240    CBC    Component Value Date/Time   WBC 29.5* 06/06/2012 2240   RBC 1.50* 06/06/2012 2240   HGB 2.8* 06/06/2012 2240   HCT 10.3* 06/06/2012 2240   PLT 50* 06/06/2012 2240   MCV 68.7* 06/06/2012 2240  MCH 18.7* 06/06/2012 2240   MCHC 27.2* 06/06/2012 2240   RDW 26.7* 06/06/2012 2240   LYMPHSABS 0.3* 06/06/2012 2240   MONOABS 0.3 06/06/2012 2240   EOSABS 0.0 06/06/2012 2240   BASOSABS 0.0 06/06/2012 2240    Intake: NPO Output:   Intake/Output Summary (Last 24 hours) at 06/07/12 1117 Last data filed at 06/07/12 1000  Gross per 24 hour  Intake 4813.67 ml  Output    295 ml  Net 4518.67 ml   No BM since admission, last BM unknown  Diet Order: NPO  Supplements/Tube Feeding: none at this time  IVF:    dextrose 5 % and 0.45% NaCl Last Rate: 100 mL/hr at 06/07/12 0300  DOPamine Last Rate: 15 mcg/kg/min (06/07/12 1000)  fentaNYL infusion INTRAVENOUS Last Rate: 50 mcg/hr (06/07/12 1101)  midazolam (VERSED) infusion   norepinephrine (LEVOPHED) Adult  infusion Last Rate: 2 mcg/min (06/07/12 1000)  [DISCONTINUED] sodium chloride     Estimated Nutritional Needs:   Kcal: 939 Protein: 45-53g Fluid: >1.3 L/day  Pt admitted with AMS for 3 days PTA.  Husband also reported poor intake for the past 3 months possibly related to aspiration.  Pt with limited mobility r/t possible MS, now with worsening sacral decubitus ulcer.  Patient is currently intubated on ventilator support r/t to sepsis with respiratory failure to due aspiration. Evidence of component of mild ALI per NP note. MV: 5.2 L/min Temp:Temp (24hrs), Avg:93.8 F (34.3 C), Min:90 F (32.2 C), Max:96.1 F (35.6 C)  Pt with anasarca, cellulitis.  Pt with s/s of malnutrition- wasting at temples and clavicles, thinning hair, etc.. Pt with FTT x3 monts, poor wt hx and suboptimal BMI for age and build.  Also with evidence of aspiration pneumonia.   Husband reports pt has been drinking meals for several years due to trouble swallowing- sips from cup, not able to use straw.  Husband makes smoothies for pt- typically consumes breakfast, lunch, and snack.  Pt does not consume dinner due to having bowel movements overnight (however later in conversation, husband reports pt was having to take epsom salt to have a bowel movement every 5 days for the past year).  Pt battled anorexia nervosa for several years as a young teen/adult.  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 Husband states that he believes smoothies are nutritionally dense, however, he does endorse making them with water and pt not drinking 100% which creates doubt for pt meeting her nutrition needs. Smoothie recipe for breakfast and snack: cheerios and almonds (1-2 cups) diluted with water to 8-12oz  Smoothie recipe for lunch:  Baby food container of green beans, carrots, and chicken diluted with water to make 8-12oz.   Husband states he and pt used to keep track of intake and calories however intake has  been so sporadic for the past year that it has been difficult to track. Pt is at high risk for refeeding syndrome. Pt with OGT.  Pt on protonix for GI prophylaxis.  NUTRITION DIAGNOSIS: -Inadequate oral intake (NI-2.1).  Status: Ongoing  RELATED TO: mechanical ventilation  AS EVIDENCE BY: NPO, pt intubated  MONITORING/EVALUATION(Goals): 1.  Enteral nutrition; initiation with tolerance.  EDUCATION NEEDS: -Education needs addressed with husband and daughter.  Loyce Dys, MS RD LDN Clinical Inpatient Dietitian Pager: [phone] Weekend/After hours pager: [phone]

## 2012-06-07 NOTE — Procedures (Signed)
Name: [name] MRN: [id number] DOB: [birth year]  PROCEDURE NOTE  Procedure:  Endotracheal intubation.  Indication:  Acute respiratory failure  Consent:  Consent was implied due to the emergency nature of the procedure.  Anesthesia: Fentanyl / Versed / Etomidate   Procedure summary:  Appropriate equipment was assembled. The patient was identified as [name] and safety timeout was performed. The patient was placed supine, with head in sniffing position. After adequate level of anesthesia was achieved, a GS#3 blade was inserted into the oropharynx and the vocal cords were visualized. A 7.0 endotracheal tube was inserted with some difficulty due to limited jaw mobility and visualized going through the vocal cords. The stylette was removed and cuff inflated. Colorimetric change was noted on the CO2 meter. Breath sounds were heard over both lung fields equally. Post procedure chest xray was ordered.  Complications:  No immediate complications were noted.  Hemodynamic parameters and oxygenation remained stable throughout the procedure.    Orlean Bradford, M.D. Pulmonary and Critical Care Medicine [facility] Cell: [phone] Pager: [phone]  06/07/2012, 12:23 AM

## 2012-06-07 NOTE — Consult Note (Signed)
Reason for Consult: Sacral decubitus Referring Physician: [name] is an 51 y.o. female.  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  She has been having problems for 20 years, and has become progressively worse.  She has refused further evaluation and treatment for at least 2 years.  She is at home on disability.  She presented to the ER at [facility] yesterday with, "altered mental status," progressively worse over the last 3 days. She stays at home and family provides all the care.  Over the  Last month she has become progressively weaker and is no longer walking. For a 2 weeks period she has not been able to get up and go to the bathroom or the chair without assistance from her husband. For at least 1 week Her husband has had to lift her out of bed to the chair, and carried her to the bathroom. On presentation to the ER with her "altered mental status,". She was hypothermic with a temperature as low as 90 MAXIMUM TEMPERATURE has been 96.6 so far. She had a WBC of 29,500. Hemoglobin 2.8. Hematocrit 10.3. Platelets 50,000. LDH 93, AST 99, ALT 69, LATIC ACID 2.3, platelet count 40.5. Chest x-ray shows multiple areas of opacity bilateral/community-acquired pneumonia with acute respiratory failure requiring intubation. Further exam shows bilateral lower extremity edema, rash, erythema, skin mottling and loss. We were asked to see for a right sacral decubitus which appears to be full-thickness, 6 x 7 x 4 cm with 100% slough.  He was unsure of how long she has been this way. Since admission she has been placed on the ventilator transfused 4 units of packed RBCs. She is currently stable on the ventilator with low-dose pressors. She is being evaluated by neurology with further recommendations to follow.  Past Medical History  Diagnosis Date  . MS (multiple sclerosis) diagnosis has never been completed and currently he has a  neuromuscular degenerative disease of uncertain etiology.    . IBS (irritable bowel syndrome)   . PTSD (post-traumatic stress disorder)   . Fibromyalgia syndrome   . TMJ disease   . Difficult intubation     secondary to jaw muscles  . Fibromyalgia   . GERD (gastroesophageal reflux disease) History depression and attempted suicide the past. No medical evaluation and treatment for the last 2 years.      Past Surgical History  Procedure Date  . Cesarean section   . Bunionectomy   . Dilation and curettage of uterus 2007  . Tubal ligation     History reviewed. No pertinent family history.  Social History:  reports that she has never smoked. She has never used smokeless tobacco. She reports that she does not drink alcohol or use illicit drugs.  Allergies:  Allergies  Allergen Reactions  . Scopolamine Anaphylaxis and Shortness Of Breath    Respiratory distress  . Ivp Dye (Iodinated Diagnostic Agents) Other (See Comments)    unknown    Medications:  Prior to Admission:  No prescriptions prior to admission   Scheduled:   . antiseptic oral rinse  1 application Mouth Rinse QID  . chlorhexidine  15 mL Mouth/Throat BID  . collagenase   Topical Daily  . [COMPLETED] dextrose 5 % and 0.45 % NaCl with KCl 20 mEq/L   Intravenous Once  . [COMPLETED] dextrose  1 ampule Intravenous Once  . [COMPLETED] DOPamine  2-20 mcg/kg/min Intravenous Once  . [COMPLETED] etomidate      .  feeding supplement (OSMOLITE 1.2 CAL)  1,000 mL Per Tube Q24H  . [COMPLETED] fentaNYL      . influenza  inactive virus vaccine  0.5 mL Intramuscular Tomorrow-1000  . levofloxacin (LEVAQUIN) IV  750 mg Intravenous QHS  . [COMPLETED] midazolam      . multivitamin  5 mL Per Tube Daily  . pantoprazole (PROTONIX) IV  40 mg Intravenous QHS  . [COMPLETED] piperacillin-tazobactam (ZOSYN)  IV  3.375 g Intravenous Once  . piperacillin-tazobactam (ZOSYN)  IV  3.375 g Intravenous Q8H  . pneumococcal 23 valent vaccine  0.5  mL Intramuscular Tomorrow-1000  . [COMPLETED] sodium chloride  1,000 mL Intravenous Once  . [COMPLETED] sodium chloride  1,000 mL Intravenous Once  . sodium chloride  1,000 mL Intravenous Once  . [COMPLETED] vancomycin  1,000 mg Intravenous Once  . vancomycin  1,000 mg Intravenous QHS  . [DISCONTINUED] sodium chloride  500 mL Intravenous Once   Continuous:   . dextrose 5 % and 0.45% NaCl 100 mL/hr at 06/07/12 0300  . fentaNYL infusion INTRAVENOUS 50 mcg/hr (06/07/12 1200)  . midazolam (VERSED) infusion    . norepinephrine (LEVOPHED) Adult infusion 12 mcg/min (06/07/12 1200)  . [DISCONTINUED] sodium chloride    . [DISCONTINUED] DOPamine 15 mcg/kg/min (06/07/12 1000)   PRN: Anti-infectives     Start     Dose/Rate Route Frequency Ordered Stop   06/07/12 2200   vancomycin (VANCOCIN) IVPB 1000 mg/200 mL premix        1,000 mg 200 mL/hr over 60 Minutes Intravenous Daily at bedtime 06/07/12 0041     06/07/12 0600   piperacillin-tazobactam (ZOSYN) IVPB 3.375 g        3.375 g 12.5 mL/hr over 240 Minutes Intravenous 3 times per day 06/07/12 0039     06/07/12 0045   levofloxacin (LEVAQUIN) IVPB 750 mg        750 mg 100 mL/hr over 90 Minutes Intravenous Daily at bedtime 06/07/12 0038     06/06/12 2115   piperacillin-tazobactam (ZOSYN) IVPB 3.375 g        3.375 g 12.5 mL/hr over 240 Minutes Intravenous  Once 06/06/12 2102 06/07/12 0128   06/06/12 2115   vancomycin (VANCOCIN) IVPB 1000 mg/200 mL premix        1,000 mg 200 mL/hr over 60 Minutes Intravenous  Once 06/06/12 2102 06/06/12 2226          Results for orders placed during the hospital encounter of 06/06/12 (from the past 48 hour(s))  CBC WITH DIFFERENTIAL     Status: Abnormal   Collection Time   06/06/12  9:05 PM      Component Value Range Comment   WBC 32.0 (*) 4.0 - 10.5 K/uL    RBC 1.72 (*) 3.87 - 5.11 MIL/uL    Hemoglobin 3.1 (*) 12.0 - 15.0 g/dL    HCT 08.6 (*) 57.8 - 46.0 %    MCV 69.2 (*) 78.0 - 100.0 fL    MCH  18.0 (*) 26.0 - 34.0 pg    MCHC 26.1 (*) 30.0 - 36.0 g/dL    RDW 46.9 (*) 62.9 - 15.5 %    Platelets 57 (*) 150 - 400 K/uL    Neutrophils Relative 99 (*) 43 - 77 %    Lymphocytes Relative 0 (*) 12 - 46 %    Monocytes Relative 1 (*) 3 - 12 %    Eosinophils Relative 0  0 - 5 %    Basophils Relative 0  0 - 1 %  Neutro Abs 31.7 (*) 1.7 - 7.7 K/uL    Lymphs Abs 0.0 (*) 0.7 - 4.0 K/uL    Monocytes Absolute 0.3  0.1 - 1.0 K/uL    Eosinophils Absolute 0.0  0.0 - 0.7 K/uL    Basophils Absolute 0.0  0.0 - 0.1 K/uL    RBC Morphology RARE NRBCs      WBC Morphology MILD LEFT SHIFT (1-5% METAS, OCC MYELO, OCC BANDS)   DOHLE BODIES   Smear Review PLATELET CLUMPS NOTED ON SMEAR   LARGE PLATELETS PRESENT  COMPREHENSIVE METABOLIC PANEL     Status: Abnormal   Collection Time   06/06/12  9:05 PM      Component Value Range Comment   Sodium 143  135 - 145 mEq/L    Potassium 4.1  3.5 - 5.1 mEq/L    Chloride 109  96 - 112 mEq/L    CO2 24  19 - 32 mEq/L    Glucose, Bld 36 (*) 70 - 99 mg/dL    BUN 34 (*) 6 - 23 mg/dL    Creatinine, Ser 4.09  0.50 - 1.10 mg/dL    Calcium 7.1 (*) 8.4 - 10.5 mg/dL    Total Protein 4.2 (*) 6.0 - 8.3 g/dL    Albumin 1.7 (*) 3.5 - 5.2 g/dL    AST 811 (*) 0 - 37 U/L    ALT 78 (*) 0 - 35 U/L    Alkaline Phosphatase 199 (*) 39 - 117 U/L    Total Bilirubin 0.8  0.3 - 1.2 mg/dL    GFR calc non Af Amer 81 (*) >90 mL/min    GFR calc Af Amer >90  >90 mL/min   LACTIC ACID, PLASMA     Status: Normal   Collection Time   06/06/12  9:05 PM      Component Value Range Comment   Lactic Acid, Venous 1.3  0.5 - 2.2 mmol/L   TROPONIN I     Status: Normal   Collection Time   06/06/12  9:05 PM      Component Value Range Comment   Troponin I <0.30  <0.30 ng/mL   DIC (DISSEMINATED INTRAVASCULAR COAGULATION) PANEL     Status: Abnormal   Collection Time   06/06/12  9:05 PM      Component Value Range Comment   Prothrombin Time 26.6 (*) 11.6 - 15.2 seconds    INR 2.60 (*) 0.00 - 1.49     aPTT 69 (*) 24 - 37 seconds    Fibrinogen 153 (*) 204 - 475 mg/dL    D-Dimer, Quant 9.14 (*) 0.00 - 0.48 ug/mL-FEU    Platelets 57 (*) 150 - 400 K/uL    Smear Review SCHISTOCYTES NOTED ON SMEAR     TSH     Status: Abnormal   Collection Time   06/06/12  9:05 PM      Component Value Range Comment   TSH 5.672 (*) 0.350 - 4.500 uIU/mL   T4, FREE     Status: Normal   Collection Time   06/06/12  9:05 PM      Component Value Range Comment   Free T4 0.80  0.80 - 1.80 ng/dL   T3     Status: Abnormal   Collection Time   06/06/12  9:05 PM      Component Value Range Comment   T3, Total 15.7 (*) 80.0 - 204.0 ng/dl   PREALBUMIN     Status: Abnormal   Collection Time  06/06/12  9:05 PM      Component Value Range Comment   Prealbumin 3.0 (*) 17.0 - 34.0 mg/dL   URINALYSIS, ROUTINE W REFLEX MICROSCOPIC     Status: Abnormal   Collection Time   06/06/12  9:48 PM      Component Value Range Comment   Color, Urine ORANGE (*) YELLOW BIOCHEMICALS MAY BE AFFECTED BY COLOR   APPearance TURBID (*) CLEAR    Specific Gravity, Urine 1.019  1.005 - 1.030    pH 5.0  5.0 - 8.0    Glucose, UA NEGATIVE  NEGATIVE mg/dL    Hgb urine dipstick MODERATE (*) NEGATIVE    Bilirubin Urine MODERATE (*) NEGATIVE    Ketones, ur NEGATIVE  NEGATIVE mg/dL    Protein, ur 30 (*) NEGATIVE mg/dL    Urobilinogen, UA 2.0 (*) 0.0 - 1.0 mg/dL    Nitrite NEGATIVE  NEGATIVE    Leukocytes, UA SMALL (*) NEGATIVE   URINE MICROSCOPIC-ADD ON     Status: Abnormal   Collection Time   06/06/12  9:48 PM      Component Value Range Comment   Squamous Epithelial / LPF FEW (*) RARE    WBC, UA 3-6  <3 WBC/hpf    RBC / HPF 3-6  <3 RBC/hpf    Bacteria, UA FEW (*) RARE    Casts HYALINE CASTS (*) NEGATIVE    Urine-Other LESS THAN 10 mL OF URINE SUBMITTED     TYPE AND SCREEN     Status: Normal (Preliminary result)   Collection Time   06/06/12  9:50 PM      Component Value Range Comment   ABO/RH(D) A POS      Antibody Screen NEG      Sample  Expiration 06/09/2012      Unit Number [id number]      Blood Component Type RED CELLS,LR      Unit division 00      Status of Unit ISSUED,FINAL      Transfusion Status OK TO TRANSFUSE      Crossmatch Result COMPATIBLE      Unit Number [id number]      Blood Component Type RED CELLS,LR      Unit division 00      Status of Unit ISSUED,FINAL      Transfusion Status OK TO TRANSFUSE      Crossmatch Result COMPATIBLE      Unit Number [id number]      Blood Component Type RED CELLS,LR      Unit division 00      Status of Unit ISSUED      Transfusion Status OK TO TRANSFUSE      Crossmatch Result Compatible      Unit Number [id number]      Blood Component Type RED CELLS,LR      Unit division 00      Status of Unit ISSUED      Transfusion Status OK TO TRANSFUSE      Crossmatch Result Compatible     ABO/RH     Status: Normal   Collection Time   06/06/12  9:50 PM      Component Value Range Comment   ABO/RH(D) A POS     PREPARE RBC (CROSSMATCH)     Status: Normal   Collection Time   06/06/12 10:00 PM      Component Value Range Comment   Order Confirmation ORDER PROCESSED BY BLOOD BANK     GLUCOSE, CAPILLARY  Status: Abnormal   Collection Time   06/06/12 10:36 PM      Component Value Range Comment   Glucose-Capillary 194 (*) 70 - 99 mg/dL    Comment 1 Notify RN      Comment 2 Documented in Chart     CBC WITH DIFFERENTIAL     Status: Abnormal   Collection Time   06/06/12 10:40 PM      Component Value Range Comment   WBC 29.5 (*) 4.0 - 10.5 K/uL    RBC 1.50 (*) 3.87 - 5.11 MIL/uL    Hemoglobin 2.8 (*) 12.0 - 15.0 g/dL    HCT 16.1 (*) 09.6 - 46.0 %    MCV 68.7 (*) 78.0 - 100.0 fL    MCH 18.7 (*) 26.0 - 34.0 pg    MCHC 27.2 (*) 30.0 - 36.0 g/dL    RDW 04.5 (*) 40.9 - 15.5 %    Platelets 50 (*) 150 - 400 K/uL    Neutrophils Relative 98 (*) 43 - 77 %    Lymphocytes Relative 1 (*) 12 - 46 %    Monocytes Relative 1 (*) 3 - 12 %    Eosinophils Relative 0  0 - 5 %     Basophils Relative 0  0 - 1 %    Neutro Abs 28.9 (*) 1.7 - 7.7 K/uL    Lymphs Abs 0.3 (*) 0.7 - 4.0 K/uL    Monocytes Absolute 0.3  0.1 - 1.0 K/uL    Eosinophils Absolute 0.0  0.0 - 0.7 K/uL    Basophils Absolute 0.0  0.0 - 0.1 K/uL    RBC Morphology RARE NRBCs      WBC Morphology MILD LEFT SHIFT (1-5% METAS, OCC MYELO, OCC BANDS)   DOHLE BODIES   Smear Review LARGE PLATELETS PRESENT     COMPREHENSIVE METABOLIC PANEL     Status: Abnormal   Collection Time   06/06/12 10:40 PM      Component Value Range Comment   Sodium 143  135 - 145 mEq/L    Potassium 3.6  3.5 - 5.1 mEq/L    Chloride 110  96 - 112 mEq/L    CO2 21  19 - 32 mEq/L    Glucose, Bld 184 (*) 70 - 99 mg/dL    BUN 33 (*) 6 - 23 mg/dL    Creatinine, Ser 8.11  0.50 - 1.10 mg/dL    Calcium 6.8 (*) 8.4 - 10.5 mg/dL    Total Protein 3.8 (*) 6.0 - 8.3 g/dL    Albumin 1.5 (*) 3.5 - 5.2 g/dL    AST 99 (*) 0 - 37 U/L    ALT 69 (*) 0 - 35 U/L    Alkaline Phosphatase 176 (*) 39 - 117 U/L    Total Bilirubin 0.7  0.3 - 1.2 mg/dL    GFR calc non Af Amer 80 (*) >90 mL/min    GFR calc Af Amer >90  >90 mL/min   LACTIC ACID, PLASMA     Status: Abnormal   Collection Time   06/06/12 10:40 PM      Component Value Range Comment   Lactic Acid, Venous 2.3 (*) 0.5 - 2.2 mmol/L   LACTATE DEHYDROGENASE     Status: Abnormal   Collection Time   06/06/12 10:40 PM      Component Value Range Comment   LDH 293 (*) 94 - 250 U/L   HAPTOGLOBIN     Status: Abnormal   Collection Time  06/06/12 10:40 PM      Component Value Range Comment   Haptoglobin 32 (*) 45 - 215 mg/dL   RETICULOCYTES     Status: Abnormal   Collection Time   06/06/12 10:40 PM      Component Value Range Comment   Retic Ct Pct 2.7  0.4 - 3.1 %    RBC. 1.50 (*) 3.87 - 5.11 MIL/uL    Retic Count, Manual 40.5  19.0 - 186.0 K/uL   BLOOD GAS, ARTERIAL     Status: Abnormal   Collection Time   06/07/12 12:35 AM      Component Value Range Comment   FIO2 0.60      Delivery systems  VENTILATOR      Mode PRESSURE REGULATED VOLUME CONTROL      VT 400      Rate 14      Peep/cpap 5.0      pH, Arterial 7.367  7.350 - 7.450    pCO2 arterial 36.7  35.0 - 45.0 mmHg    pO2, Arterial 120.0 (*) 80.0 - 100.0 mmHg    Bicarbonate 20.5  20.0 - 24.0 mEq/L    TCO2 19.7  0 - 100 mmol/L    Acid-base deficit 3.8 (*) 0.0 - 2.0 mmol/L    O2 Saturation 99.7      Patient temperature 98.6      Collection site LEFT RADIAL      Drawn by [id number]      Sample type ARTERIAL DRAW      Allens test (pass/fail) PASS  PASS   LACTIC ACID, PLASMA     Status: Normal   Collection Time   06/07/12  1:25 AM      Component Value Range Comment   Lactic Acid, Venous 1.9  0.5 - 2.2 mmol/L   CORTISOL     Status: Normal   Collection Time   06/07/12  1:25 AM      Component Value Range Comment   Cortisol, Plasma 40.7     ACETAMINOPHEN LEVEL     Status: Normal   Collection Time   06/07/12  1:25 AM      Component Value Range Comment   Acetaminophen (Tylenol), Serum <15.0  10 - 30 ug/mL   FACTOR 8 ASSAY     Status: Abnormal   Collection Time   06/07/12  1:25 AM      Component Value Range Comment   Coagulation Factor VIII 527 (*) 73 - 140 %   PROCALCITONIN     Status: Normal   Collection Time   06/07/12  1:25 AM      Component Value Range Comment   Procalcitonin 0.57     MAGNESIUM     Status: Normal   Collection Time   06/07/12  1:25 AM      Component Value Range Comment   Magnesium 2.2  1.5 - 2.5 mg/dL   PHOSPHORUS     Status: Abnormal   Collection Time   06/07/12  1:25 AM      Component Value Range Comment   Phosphorus 5.8 (*) 2.3 - 4.6 mg/dL   MRSA PCR SCREENING     Status: Normal   Collection Time   06/07/12  2:37 AM      Component Value Range Comment   MRSA by PCR NEGATIVE  NEGATIVE   GLUCOSE, CAPILLARY     Status: Abnormal   Collection Time   06/07/12  3:43 AM      Component Value  Range Comment   Glucose-Capillary 141 (*) 70 - 99 mg/dL   CULTURE, RESPIRATORY     Status: Normal (Preliminary  result)   Collection Time   06/07/12  3:55 AM      Component Value Range Comment   Specimen Description TRACHEAL ASPIRATE      Special Requests NONE      Gram Stain        Value: RARE WBC PRESENT, PREDOMINANTLY MONONUCLEAR     RARE SQUAMOUS EPITHELIAL CELLS PRESENT     NO ORGANISMS SEEN   Culture PENDING      Report Status PENDING     GLUCOSE, CAPILLARY     Status: Abnormal   Collection Time   06/07/12  7:39 AM      Component Value Range Comment   Glucose-Capillary 113 (*) 70 - 99 mg/dL    Comment 1 Documented in Chart      Comment 2 Notify RN     PREPARE FRESH FROZEN PLASMA     Status: Normal (Preliminary result)   Collection Time   06/07/12 12:00 PM      Component Value Range Comment   Unit Number [id number]      Blood Component Type THAWED PLASMA      Unit division 00      Status of Unit ISSUED      Transfusion Status OK TO TRANSFUSE      Unit Number [id number]      Blood Component Type THAWED PLASMA      Unit division 00      Status of Unit ALLOCATED      Transfusion Status OK TO TRANSFUSE     GLUCOSE, CAPILLARY     Status: Normal   Collection Time   06/07/12 12:13 PM      Component Value Range Comment   Glucose-Capillary 71  70 - 99 mg/dL    Comment 1 Documented in Chart      Comment 2 Notify RN     LACTIC ACID, PLASMA     Status: Abnormal   Collection Time   06/07/12 12:55 PM      Component Value Range Comment   Lactic Acid, Venous 3.0 (*) 0.5 - 2.2 mmol/L     Dg Pelvis Portable  06/06/2012  *RADIOLOGY REPORT*  Clinical Data: Sacral pressure ulcer.  PORTABLE PELVIS  Comparison: CT of the abdomen and pelvis performed 03/27/2009  Findings: There is no evidence of fracture or dislocation.  Both femoral heads are seated normally within their respective acetabula.  No significant degenerative change is appreciated.  The sacroiliac joints are unremarkable in appearance.  The visualized bowel gas pattern is grossly unremarkable in appearance; a large amount of stool  and air are seen within the mildly distended colon.  IMPRESSION: No evidence of fracture or dislocation.   Original Report Authenticated By: Tonia Ghent, M.D.    Dg Chest Portable 1 View  06/07/2012  *RADIOLOGY REPORT*  Clinical Data: Pressure ulcer.  Acute respiratory failure. Multiple sclerosis.  PORTABLE CHEST - 1 VIEW  Comparison: 06/06/2012  Findings: Endotracheal tube is now seen in place with tip approximately 4 cm above the carina. A new left subclavian center venous catheter is seen with tip overlying the superior cavoatrial junction.  No evidence of pneumothorax. New nasogastric is also seen with tip in the proximal stomach.  Mild worsening of the diffuse bilateral pulmonary air space disease is seen.  Small right pleural effusion shows no significant change. Heart size is stable allowing  for patient positioning.  IMPRESSION:  1. New endotracheal tube and left subclavian center venous catheter in appropriate position.  No evidence of pneumothorax. 2.  Mild worsening of symmetric bilateral airspace disease. 3.  Stable small right pleural effusion.   Original Report Authenticated By: Myles Rosenthal, M.D.    Dg Chest Portable 1 View  06/06/2012  *RADIOLOGY REPORT*  Clinical Data: Shortness of breath; weakness.  PORTABLE CHEST - 1 VIEW  Comparison: Chest radiograph performed 03/27/2009  Findings: There is relatively diffuse bilateral airspace opacification, with some degree of central sparing, raising suspicion for multifocal pneumonia.  A small to moderate right- sided pleural effusion is seen.  No pneumothorax is identified.  The cardiomediastinal silhouette is within normal limits.  No acute osseous abnormalities are seen.  IMPRESSION: Relatively diffuse bilateral airspace opacification raises concern for multifocal pneumonia.  Associated new small to moderate right- sided pleural effusion seen.   Original Report Authenticated By: Tonia Ghent, M.D.     Review of Systems  Unable to perform ROS:  intubated  Constitutional: Positive for weight loss (not really sure, husband says she doesn't eat well, worse last few days.). Negative for fever and chills.       This is a cachectic WF, on the ventilator, but fully awake. She has refused Rx for 2 years,  Family has been doing all her care. History is all from family  HENT:       She has shaved the hair in the back of her head because of chapping while in bed.  Eyes:       Intubated  Skin: Positive for rash.       She is cachectic, emaciated in appearance with edema in her trunk and both lower legs.  She has a rash and skin abrasions/skin loss in both lower extremities.  Neurological: Positive for weakness (She has been in bed and chair for 1 month. For the last 2 weeks he has had to help her  up to chair and BR.  For the last  week she has not been able to help and her husband has been lifting her in and out of bed, carrying her to BR.).   Blood pressure 96/67, pulse 81, temperature 96.1 F (35.6 C), resp. rate 14, height 5\' 6"  (1.676 m), weight 98 lb 8.7 oz (44.7 kg), SpO2 100.00%. Physical Exam  Constitutional:       Thin emaciated WF, on the ventilator with pressors at low dose. All information is from family members.   HENT:       Intubated and OG in place.  She has an order for trickle feeds.  Eyes: Conjunctivae normal are normal. Pupils are equal, round, and reactive to light. Right eye exhibits no discharge. Left eye exhibits no discharge. No scleral icterus.  Neck: Normal range of motion. Neck supple. No JVD present. No tracheal deviation present. No thyromegaly present.  Cardiovascular: Normal heart sounds.        Tachycardia regular rate  Respiratory: No stridor. She is in respiratory distress. She has rales. She exhibits no tenderness.       On vent with tubular BS, not wheezing right now.  GI: She exhibits distension. She exhibits no mass. There is no tenderness. There is no rebound and no guarding.       Mildly  distended, with some edema in abdominal wall.  Musculoskeletal: She exhibits edema (she has +2-3 edema in both lower legs, with rash and weeping from legs earlier according to her  husband.). She exhibits no tenderness.  Neurological: She is alert. No cranial nerve deficit.  Skin: There is erythema.       She is emaciated, with edema in the abdomen/trunk and both lower legs. Areas of skin loss and erythema both lower legs.  Left buttocks has 2 areas about 1 cm2 each, which can be treated with local care and some santyl.  Minimal skin loss, but full thickness level 2 or more on one of these.  Right side has a 6 x 7 x 4 cm slough which goes to the bone on exam.  This will need debridement, but I will need to talk with Dr. Daphine Deutscher about timing.    Assessment/Plan: 1. Altered mental status with metabolic encephalopathy. 2. Probable community-acquired pneumonia bilateral with acute respiratory failure; ventilator dependent. 3. 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 4. Sepsis with acidosis, 2.3 lactic acid; WBC 29,500. On Pressors. 5. Severe anemia with a hemoglobin 2.8/hct 10.3 6. Thrombocytopenia with platelet count of [id number]. 7. Acute liver failure INR 2.6 8. Bilateral decubitus, minimal on the left. Large 6 x 7 x 4 cm area of the right ischium with 100% slough. 9. Malnutrition and deconditioning  Plan: I will review this with Dr. Daphine Deutscher but I think we can handle the changes on the left with local wound care and Santyl cream. On the left she will need debridement, at this point she's not stable enough to go to the operating room and be put the sleep.  May try some local bedside debridement and dressing changes, if she appears more stable tomorrow.  [facility] physician assistant for Dr. Luretha Murphy.  Ervin Rothbauer 06/07/2012, 2:32 PM

## 2012-06-07 NOTE — ED Notes (Signed)
Md notified about critical value. D50 given minutes prior already.

## 2012-06-07 NOTE — Progress Notes (Signed)
PULMONARY  / CRITICAL CARE MEDICINE  Name: [name] MRN: [id number] DOB: 01/15/1961    LOS: 1  REFERRING MD :  EDP  CHIEF COMPLAINT:  FTT and AMS  BRIEF PATIENT DESCRIPTION: 51 yo with limited mobility due to neuromuscular disease (?MS) last seen by Neurologist about 2 years ago brought to [facility] ED with 3 d h/o altered mental status, and FTT X 3 mo. In ED: hypotensive, hypoglycemic, hypothermic, bradycardic, Hb 3, WBC 30.  SIGNIFICANT EVENTS:  12/3 Admitted for altered mental status, sepsis, secondary to aspiration pneumonia / infected decubitus and severe anemia. Intubated. CVL placed.  LINES / TUBES:  12/3 OETT>>> 12/3 OGT >>> 12/3 Foley >>> 12/3 L [place] CVL >>>  CULTURES:  12/3 Blood >>>  12/3 Respiratory >>>  12/3 Urine >>>   ANTIBIOTICS:  Zosyn 12/3 >>>  Vancomycin 12/3 >>>  Levaquin 12/3 >>>   INTERVAL HISTORY:  Now hypotensive   VITAL SIGNS: Temp:  [90 F (32.2 C)-96.1 F (35.6 C)] 96.1 F (35.6 C) (12/03 1000) Pulse Rate:  [49-88] 88  (12/03 1000) Resp:  [12-20] 16  (12/03 1000) BP: (54-108)/(33-81) 93/60 mmHg (12/03 1000) SpO2:  [90 %-100 %] 100 % (12/03 1000) FiO2 (%):  [40 %-60 %] 40 % (12/03 0800) Weight:  [44.7 kg (98 lb 8.7 oz)-46.267 kg (102 lb)] 44.7 kg (98 lb 8.7 oz) (12/03 0244) HEMODYNAMICS: CVP:  [8 mmHg-10 mmHg] 10 mmHg VENTILATOR SETTINGS: Vent Mode:  [-] PRVC FiO2 (%):  [40 %-60 %] 40 % Set Rate:  [14 bmp-15 bmp] 14 bmp Vt Set:  [400 mL] 400 mL PEEP:  [5 cmH20] 5 cmH20 Plateau Pressure:  [22 cmH20-23 cmH20] 23 cmH20 INTAKE / OUTPUT: Intake/Output      12/02 0701 - 12/03 0700 12/03 0701 - 12/04 0700   I.V. (mL/kg) 443 (9.9) 346.5 (7.8)   Blood 1024.2 300   Other 2500    IV Piggyback 175 25   Total Intake(mL/kg) 4142.2 (92.7) 671.5 (15)   Urine (mL/kg/hr) 100 (0.1) 195   Total Output 100 195   Net +4042.2 +476.5          PHYSICAL EXAMINATION: General:  Frail 51 year old female. Acute on chronically ill  Neuro:  Opens  eyes, no f/c, profoundly weak  HEENT:  Temporal wasting. Orally intubated  Cardiovascular:  rrr Lungs:  Crackles in bases  Abdomen:  Firm, + bowel sounds  Musculoskeletal:  weak Skin:  + anasarca, Lower extremity erythremic rash, c/w cellulitis. Sacral wound dressing intact     LABS: Cbc  Lab 06/06/12 2240 06/06/12 2105  WBC 29.5* --  HGB 2.8* 3.1*  HCT 10.3* 11.9*  PLT 50* 57*57*    Chemistry   Lab 06/07/12 0125 06/06/12 2240 06/06/12 2105  NA -- 143 143  K -- 3.6 4.1  CL -- 110 109  CO2 -- 21 24  BUN -- 33* 34*  CREATININE -- 0.84 0.83  CALCIUM -- 6.8* 7.1*  MG 2.2 -- --  PHOS 5.8* -- --  GLUCOSE -- 184* 36*    Liver fxn  Lab 06/06/12 2240 06/06/12 2105  AST 99* 110*  ALT 69* 78*  ALKPHOS 176* 199*  BILITOT 0.7 0.8  PROT 3.8* 4.2*  ALBUMIN 1.5* 1.7*   coags  Lab 06/06/12 2105  APTT 69*  INR 2.60*   Sepsis markers  Lab 06/07/12 0125 06/06/12 2240 06/06/12 2105  LATICACIDVEN 1.9 2.3* 1.3  PROCALCITON 0.57 -- --   Cardiac markers  Lab 06/06/12 2105  CKTOTAL --  CKMB --  TROPONINI <0.30   BNP No results found for this basename: PROBNP:3 in the last 168 hours ABG  Lab 06/07/12 0035  PHART 7.367  PCO2ART 36.7  PO2ART 120.0*  HCO3 20.5  TCO2 19.7    CBG trend  Lab 06/07/12 0739 06/07/12 0343 06/06/12 2236  GLUCAP 113* 141* 194*    IMAGING: ETT in good position. Increased bilateral airspace disease. R effusion   ECG:  DIAGNOSES: Principal Problem:  *Acute encephalopathy Active Problems:  Septic shock(785.52)  Acute respiratory failure  CAP (community acquired pneumonia) vs aspiration pneumonia   Anemia  Hypovolemia  Thrombocytopenia  Coagulopathy  Acute liver failure  Neuromuscular disorder: not fully defined.   Hypothermia  Hypoglycemia ( resolved 12/3)  Hyperglycemia  Bradycardia (resolved)   ASSESSMENT / PLAN:  PULMONARY  A:  Acute respiratory failure.  CAP vs aspiration pneumonia.  Pleural effusion  (R). Aeration worse. Prob component of mild ALI and low oncotic pressure edema in setting of poor nutritional status.  P:   Gaol SpO2>92, pH>7.30 Full mechanical support Daily SBT Trend ABG / CXR  CARDIOVASCULAR  A:  Sepsis/ septic shock: see ID section. Random cort was > 40.  Hypovolemia: profound anemia but no evidence of active bleeding  Suspected cardiomyopathy: echo obtained  P:  Goal MAP>60, HR > 80 Transition to levophed CVP 8-12 Cont IVFs and f/u post-xfusion CBC  RENAL  A:  Normal renal function / electrolytes. Hyperphosphatemia P:   Cont IVFs F/u chem  GASTROINTESTINAL  A:   Severe malnutrition.   Liver failure (acute vs chronic). At risk for refeeding syndrome.   P:   Nutritionist consult Pre Albumin Tube feeds   HEMATOLOGIC  A:   Thrombocytopenia.   Anemia.  No evidence of acute bleeding. Got 4 units PRBC.  Coagulopathy.  P:  Trend CBC Give FFP  Heme-occult stools  INFECTIOUS  A:   CAP vs aspiration pneumonia.   Infected sacral decubitus. LE cellulitis P:   Cultures and antibiotics as above PCT Wound care consult  ENDOCRINE   A:   Hyperglycemia Was hypoglycemic. This has resolved.   CBG TSH, free T4, total T3  NEUROLOGIC  A:   Baseline neuromuscular disese, ?MS. Possibly d/t MS flair?   Acute encephalopathy.  No focal findings. P:   Goal RASS 0 to -1 Fentanyl / Versed gtt Neurology consult   CLINICAL SUMMARY: 51 yo with limited mobility due to neuromuscular disease (?MS) last seen by Neurologist about 2 years ago brought to [facility] ED with altered mental status.  Husband describes decreased food intake, limited mobility and generalized edema since about 3 months ago.  There has been acute change in mental status since about 3 days ago.  Husband describes aspiration of food.  Also reports worsening sacral decubitus.  In ED: hypotensive, hypoglycemic, hypothermic, bradycardic, Hb 3, WBC 30.  Intubated for airway protection.  CVL  placed.  Tonight:  fluid, blood products, broad spectrum antibiotics.  I have personally obtained a history, examined the patient, evaluated laboratory and imaging results, formulated the assessment and plan and placed orders.  CRITICAL CARE:  The patient is critically ill with multiple organ systems failure and requires high complexity decision making for assessment and support, frequent evaluation and titration of therapies, application of advanced monitoring technologies and extensive interpretation of multiple databases. Critical Care Time devoted to patient care services described in this note is 40 minutes.      Billy Fischer, MD ; PCCM service  Mobile [phone].  After 5:30 PM or weekends, call [phone]

## 2012-06-07 NOTE — ED Notes (Signed)
Charge RN contacted to place IV. 24 gauge placed on left hand. Lurena Joiner, RN and Eden Emms RN attempted x2 each with no success. Blood return minimal, not enough for blood work.

## 2012-06-07 NOTE — ED Notes (Signed)
Pt is curled in fetal position. Responsive only to pain. Subcutaneous tissue minimal. Has pressure ulcer size 5 cm in left buttocks. Weeping sores noted bilateral legs with swelling.

## 2012-06-08 ENCOUNTER — Inpatient Hospital Stay (HOSPITAL_COMMUNITY): Payer: Managed Care, Other (non HMO)

## 2012-06-08 DIAGNOSIS — N390 Urinary tract infection, site not specified: Secondary | ICD-10-CM | POA: Diagnosis present

## 2012-06-08 LAB — PREPARE FRESH FROZEN PLASMA
Unit division: 0
Unit division: 0

## 2012-06-08 LAB — GLUCOSE, CAPILLARY
Glucose-Capillary: 109 mg/dL — ABNORMAL HIGH (ref 70–99)
Glucose-Capillary: 77 mg/dL (ref 70–99)
Glucose-Capillary: 89 mg/dL (ref 70–99)
Glucose-Capillary: 92 mg/dL (ref 70–99)
Glucose-Capillary: 94 mg/dL (ref 70–99)
Glucose-Capillary: 95 mg/dL (ref 70–99)
Glucose-Capillary: 97 mg/dL (ref 70–99)

## 2012-06-08 LAB — PROTIME-INR
INR: 1.73 — ABNORMAL HIGH (ref 0.00–1.49)
Prothrombin Time: 19.7 seconds — ABNORMAL HIGH (ref 11.6–15.2)

## 2012-06-08 LAB — TYPE AND SCREEN
ABO/RH(D): A POS
Antibody Screen: NEGATIVE
Unit division: 0
Unit division: 0
Unit division: 0
Unit division: 0

## 2012-06-08 LAB — COMPREHENSIVE METABOLIC PANEL
ALT: 69 U/L — ABNORMAL HIGH (ref 0–35)
AST: 102 U/L — ABNORMAL HIGH (ref 0–37)
Albumin: 1.7 g/dL — ABNORMAL LOW (ref 3.5–5.2)
Alkaline Phosphatase: 159 U/L — ABNORMAL HIGH (ref 39–117)
BUN: 20 mg/dL (ref 6–23)
CO2: 23 mEq/L (ref 19–32)
Calcium: 6.8 mg/dL — ABNORMAL LOW (ref 8.4–10.5)
Chloride: 109 mEq/L (ref 96–112)
Creatinine, Ser: 0.6 mg/dL (ref 0.50–1.10)
GFR calc Af Amer: >90 mL/min (ref >90)
GFR calc non Af Amer: >90 mL/min (ref >90)
Glucose, Bld: 102 mg/dL — ABNORMAL HIGH (ref 70–99)
Potassium: 3.3 mEq/L — ABNORMAL LOW (ref 3.5–5.1)
Sodium: 142 mEq/L (ref 135–145)
Total Bilirubin: 1.3 mg/dL — ABNORMAL HIGH (ref 0.3–1.2)
Total Protein: 4.5 g/dL — ABNORMAL LOW (ref 6.0–8.3)

## 2012-06-08 LAB — BLOOD GAS, ARTERIAL
Acid-base deficit: 2 mmol/L (ref 0.0–2.0)
Bicarbonate: 22.9 mEq/L (ref 20.0–24.0)
Drawn by: 244901
FIO2: 0.4 %
MECHVT: 400 mL
O2 Saturation: 99.8 %
PEEP: 5 cmH2O
Patient temperature: 94.6
RATE: 14 resp/min
TCO2: 21 mmol/L (ref 0–100)
pCO2 arterial: 37.6 mmHg (ref 35.0–45.0)
pH, Arterial: 7.388 (ref 7.350–7.450)
pO2, Arterial: 156 mmHg — ABNORMAL HIGH (ref 80.0–100.0)

## 2012-06-08 LAB — CBC
HCT: 34.2 % — ABNORMAL LOW (ref 36.0–46.0)
Hemoglobin: 11.5 g/dL — ABNORMAL LOW (ref 12.0–15.0)
MCH: 26.3 pg (ref 26.0–34.0)
MCHC: 33.6 g/dL (ref 30.0–36.0)
MCV: 78.3 fL (ref 78.0–100.0)
Platelets: 23 10*3/uL — CL (ref 150–400)
RBC: 4.37 MIL/uL (ref 3.87–5.11)
RDW: 20.5 % — ABNORMAL HIGH (ref 11.5–15.5)
WBC: 20.1 10*3/uL — ABNORMAL HIGH (ref 4.0–10.5)

## 2012-06-08 LAB — LACTIC ACID, PLASMA: Lactic Acid, Venous: 1.9 mmol/L (ref 0.5–2.2)

## 2012-06-08 LAB — PROCALCITONIN: Procalcitonin: 1.1 ng/mL

## 2012-06-08 MED ORDER — POTASSIUM CHLORIDE 10 MEQ/50ML IV SOLN
10.0000 meq | INTRAVENOUS | Status: AC
  Administered 2012-06-08 (×6): 10 meq via INTRAVENOUS
  Filled 2012-06-08 (×2): qty 50
  Filled 2012-06-08: qty 100
  Filled 2012-06-08 (×2): qty 50

## 2012-06-08 MED ORDER — MILRINONE IN DEXTROSE 20 MG/100ML IV SOLN
0.1250 ug/kg/min | INTRAVENOUS | Status: DC
  Administered 2012-06-08 – 2012-06-17 (×6): 0.125 ug/kg/min via INTRAVENOUS
  Filled 2012-06-08 (×8): qty 100

## 2012-06-08 NOTE — Progress Notes (Signed)
PULMONARY  / CRITICAL CARE MEDICINE  Name: [name] MRN: [id number] DOB: 01-09-1961    LOS: 2  REFERRING MD :  EDP  CHIEF COMPLAINT:  FTT and AMS  BRIEF PATIENT DESCRIPTION: 51 yo with limited mobility due to neuromuscular disease (?MS) last seen by Neurologist about 2 years ago brought to [facility] ED with 3 d h/o altered mental status, and FTT X 3 mo. In ED: hypotensive, hypoglycemic, hypothermic, bradycardic, Hb 3, WBC 30.  SIGNIFICANT EVENTS:  12/3 Admitted for altered mental status, sepsis, secondary to aspiration pneumonia / infected decubitus and severe anemia. Intubated. CVL placed.  LINES / TUBES:  12/3 OETT>>> 12/3 OGT >>> 12/3 Foley >>> 12/3 L [place] CVL >>>  CULTURES:  12/3 Blood >>>  12/3 Respiratory >>>  12/3 Urine: enterococcus>>>  ANTIBIOTICS:  Zosyn 12/3 >>> 12/4 Vancomycin 12/3 >>>  Levaquin 12/3 >>>   INTERVAL HISTORY:  Weaning off pressors    VITAL SIGNS: Temp:  [94.3 F (34.6 C)-96.4 F (35.8 C)] 96.4 F (35.8 C) (12/04 1100) Pulse Rate:  [75-89] 81  (12/04 1100) Resp:  [14-22] 14  (12/04 1100) BP: (78-111)/(48-74) 91/53 mmHg (12/04 1100) SpO2:  [99 %-100 %] 100 % (12/04 1100) FiO2 (%):  [30 %-40 %] 30 % (12/04 1114) Weight:  [47.8 kg (105 lb 6.1 oz)] 47.8 kg (105 lb 6.1 oz) (12/04 0430) HEMODYNAMICS: CVP:  [8 mmHg-11 mmHg] 8 mmHg VENTILATOR SETTINGS: Vent Mode:  [-] PRVC FiO2 (%):  [30 %-40 %] 30 % Set Rate:  [14 bmp] 14 bmp Vt Set:  [400 mL] 400 mL PEEP:  [5 cmH20] 5 cmH20 Plateau Pressure:  [23 cmH20] 23 cmH20 INTAKE / OUTPUT: Intake/Output      12/03 0701 - 12/04 0700 12/04 0701 - 12/05 0700   I.V. (mL/kg) 2352 (49.2) 185.2 (3.9)   Blood 995    Other 210    NG/GT 225 75   IV Piggyback 522.5 412.5   Total Intake(mL/kg) 4304.5 (90.1) 672.7 (14.1)   Urine (mL/kg/hr) 1395 (1.2) 500 (1.7)   Total Output 1395 500   Net +2909.5 +172.7          PHYSICAL EXAMINATION: General:  Frail 51 year old female. Acute on chronically ill   Neuro:  Opens eyes, no f/c, profoundly weak  HEENT:  Temporal wasting. Orally intubated  Cardiovascular:  rrr Lungs:  Crackles in bases, scattered rhonchi, looks comfortable on higher levels of PSV Abdomen:  Firm, + bowel sounds  Musculoskeletal:  weak Skin:  + anasarca, Lower extremity erythremic rash, c/w cellulitis. Sacral wound dressing intact     LABS: Cbc  Lab 06/08/12 0432 06/07/12 1325 06/06/12 2240  WBC 20.1* -- --  HGB 11.5* 11.7* 2.8*  HCT 34.2* 34.6* 10.3*  PLT 23* 30* 50*    Chemistry   Lab 06/08/12 0432 06/07/12 0125 06/06/12 2240 06/06/12 2105  NA 142 -- 143 143  K 3.3* -- 3.6 4.1  CL 109 -- 110 109  CO2 23 -- 21 24  BUN 20 -- 33* 34*  CREATININE 0.60 -- 0.84 0.83  CALCIUM 6.8* -- 6.8* 7.1*  MG -- 2.2 -- --  PHOS -- 5.8* -- --  GLUCOSE 102* -- 184* 36*    Liver fxn  Lab 06/08/12 0432 06/06/12 2240 06/06/12 2105  AST 102* 99* 110*  ALT 69* 69* 78*  ALKPHOS 159* 176* 199*  BILITOT 1.3* 0.7 0.8  PROT 4.5* 3.8* 4.2*  ALBUMIN 1.7* 1.5* 1.7*   coags  Lab 06/08/12 7253  06/06/12 2105  APTT -- 69*  INR 1.73* 2.60*   Sepsis markers  Lab 06/08/12 0432 06/08/12 0422 06/07/12 1255 06/07/12 0125  LATICACIDVEN -- 1.9 3.0* 1.9  PROCALCITON 1.10 -- -- 0.57   Cardiac markers  Lab 06/06/12 2105  CKTOTAL --  CKMB --  TROPONINI <0.30   BNP No results found for this basename: PROBNP:3 in the last 168 hours ABG  Lab 06/08/12 0422 06/07/12 0035  PHART 7.388 7.367  PCO2ART 37.6 36.7  PO2ART 156.0* 120.0*  HCO3 22.9 20.5  TCO2 21.0 19.7    CBG trend  Lab 06/08/12 1156 06/08/12 0737 06/08/12 0355 06/08/12 0011 06/07/12 2019  GLUCAP 94 89 97 109* 95    IMAGING: ETT in good position. Increased bilateral airspace disease. Looks like element of edema   ECG:  DIAGNOSES: Principal Problem:  *Acute encephalopathy Active Problems:  Septic shock(785.52)  Acute respiratory failure  CAP (community acquired pneumonia) vs aspiration pneumonia    Anemia  Hypovolemia  Thrombocytopenia  Coagulopathy  Acute liver failure  Neuromuscular disorder: not fully defined.   Hypothermia  Hypoglycemia ( resolved 12/3)  Hyperglycemia  Bradycardia (resolved)   ASSESSMENT / PLAN:  PULMONARY  A:  Acute respiratory failure.  CAP vs aspiration pneumonia.  Pleural effusion (R). Aeration worse. Prob component of mild ALI and low oncotic pressure edema  Vs pulmonary edema d/t CM P:   Gaol SpO2>92, pH>7.30 Full mechanical support Daily SBT Trend ABG / CXR May need to consider plasmaphoresis to allow her to get off vent   CARDIOVASCULAR  A:  Sepsis/ septic shock: see ID section. Random cort was > 40. Weaning pressors slowly.  Hypovolemia: profound anemia but no evidence of active bleeding  cardiomyopathy: echo obtained, EF 20-25% w/ diffuse Kinesis   P:  Goal MAP>60, HR > 80 Transition to levophed, wean as tolerated  CVP 8-12 Cont IVFs and f/u post-xfusion CBC Will try milrinone gtt to add inotropic support and see if that allows [place] to to get her off pressors  RENAL  A:  Normal renal function / electrolytes. Hypokalemia  P:   Cont IVFs Replace and recheck lytes as indicated  F/u chem  GASTROINTESTINAL  A:   Severe malnutrition.   Liver failure (acute vs chronic). At risk for refeeding syndrome.   abd distention  P:   Nutritionist consult Pre Albumin Tube feeds  KUB and abd [place]   HEMATOLOGIC  A:   Thrombocytopenia.   Anemia.  No evidence of acute bleeding. Got 4 units PRBC.  Coagulopathy. S/p FFP, still >1.5 P:  Trend CBC Heme-occult stools PAS  INFECTIOUS  A:   CAP vs aspiration pneumonia.   Infected sacral decubitus. LE cellulitis UTI-->enterococcus  P:   Cultures and antibiotics as above PCT Wound care consult: santyl to left, right will need debridement Per surg   ENDOCRINE   A:   Hyperglycemia Mild hypothyroidism  Was hypoglycemic. This has resolved.  P: CBG Consider adding synthroid   NEUROLOGIC  A:   Baseline neuromuscular disese, ?MS. Possibly d/t MS flair? Seen by Neurology, who postulate some sort of autoimmune process but do not know what.   Acute encephalopathy.  No focal findings. P:   Goal RASS 0 to -1 Fentanyl / Versed gtt F/u anti-GAD AB May consider plasmaphoresis, IVIG or high dose steroids after she is no longer acutely ill.   CLINICAL SUMMARY: 51 yo with limited mobility due to neuromuscular disease (?MS) remains on vent due to aspiration PNA, UTI, sacral decub.  Superimposed on newly identified CM EF 20%. We are weaning pressors, and tapering abx. Will add milrinone to see if we can get her off vasoactive gtts.   I have personally obtained a history, examined the patient, evaluated laboratory and imaging results, formulated the assessment and plan and placed orders.  CRITICAL CARE:  The patient is critically ill with multiple organ systems failure and requires high complexity decision making for assessment and support, frequent evaluation and titration of therapies, application of advanced monitoring technologies and extensive interpretation of multiple databases. Critical Care Time devoted to patient care services described in this note is 35 minutes.      Billy Fischer, MD ; [facility] [phone].  After 5:30 PM or weekends, call [phone]

## 2012-06-08 NOTE — Progress Notes (Signed)
eLink Physician-Brief Progress Note Patient Name: [name] DOB: [birth year] MRN: [id number]  Date of Service  06/08/2012   HPI/Events of Note  Hypokalemia   eICU Interventions  Potassium replaced   Intervention Category Intermediate Interventions: Electrolyte abnormality - evaluation and management  Clotile Whittington,Lissa 06/08/2012, 6:48 AM

## 2012-06-08 NOTE — Progress Notes (Signed)
CRITICAL VALUE ALERT  Critical value received:  platelets  Date of notification:  06/08/12  Time of notification:  0535  Critical value read back:yes  Nurse who received alert:  Kinnie Feil, RN  MD notified (1st page):  Elink   Time of first page:  [phone]  MD notified (2nd page):  Time of second page:  Responding MD:  Deter[place]  Time MD responded:  0630, no new orders given

## 2012-06-08 NOTE — Progress Notes (Signed)
Subjective: Sedated on Vent, her husband thinks she's more coherent, she  Recognized family this Am when they came.    Objective: Vital signs in last 24 hours: Temp:  [94.3 F (34.6 C)-96.6 F (35.9 C)] 96.1 F (35.6 C) (12/04 0700) Pulse Rate:  [75-89] 83  (12/04 0700) Resp:  [13-22] 22  (12/04 0700) BP: (76-111)/(35-74) 102/72 mmHg (12/04 0700) SpO2:  [99 %-100 %] 100 % (12/04 0700) FiO2 (%):  [30 %-40 %] 30 % (12/04 0837) Weight:  [105 lb 6.1 oz (47.8 kg)] 105 lb 6.1 oz (47.8 kg) (12/04 0430)   Diet: NPO, afebrile, VSS, still on low dose pressor, marked improvement of H/H.  Platelets 23,000, INR improving WBC improving, Intake/Output from previous day: 12/03 0701 - 12/04 0700 In: 4304.5 [I.V.:2352; Blood:995; NG/GT:225; IV Piggyback:522.5] Out: 1395 [Urine:1395] Intake/Output this shift: Total I/O In: 169.1 [I.V.:41.6; NG/GT:15; IV Piggyback:112.5] Out: 250 [Urine:250]  General appearance: sedated on VEnt, with warmer on for hypothermia Back:  she has skin changes on her back also. Resp: clear upper lobes, no wheezing Skin:Dr Daphine Deutscher has reviewed and she will need debridement on the Right side.  No change from yesterday.  Lab Results:   [facility] 06/08/12 0432 06/07/12 1325  WBC 20.1* 24.6*  HGB 11.5* 11.7*  HCT 34.2* 34.6*  PLT 23* 30*    BMET  Basename 06/08/12 0432 06/06/12 2240  NA 142 143  K 3.3* 3.6  CL 109 110  CO2 23 21  GLUCOSE 102* 184*  BUN 20 33*  CREATININE 0.60 0.84  CALCIUM 6.8* 6.8*   PT/INR  Basename 06/08/12 0432 06/06/12 2105  LABPROT 19.7* 26.6*  INR 1.73* 2.60*     Lab 06/08/12 0432 06/06/12 2240 06/06/12 2105  AST 102* 99* 110*  ALT 69* 69* 78*  ALKPHOS 159* 176* 199*  BILITOT 1.3* 0.7 0.8  PROT 4.5* 3.8* 4.2*  ALBUMIN 1.7* 1.5* 1.7*     Lipase  No results found for this basename: lipase     Studies/Results: Dg Pelvis Portable  06/06/2012  *RADIOLOGY REPORT*  Clinical Data: Sacral pressure ulcer.  PORTABLE PELVIS   Comparison: CT of the abdomen and pelvis performed 03/27/2009  Findings: There is no evidence of fracture or dislocation.  Both femoral heads are seated normally within their respective acetabula.  No significant degenerative change is appreciated.  The sacroiliac joints are unremarkable in appearance.  The visualized bowel gas pattern is grossly unremarkable in appearance; a large amount of stool and air are seen within the mildly distended colon.  IMPRESSION: No evidence of fracture or dislocation.   Original Report Authenticated By: Tonia Ghent, M.D.    Dg Chest Port 1 View  06/08/2012  *RADIOLOGY REPORT*  Clinical Data: Evaluate endotracheal tube  PORTABLE CHEST - 1 VIEW  Comparison: 06/07/2012; 06/06/2012; 03/27/2009  Findings: Grossly unchanged cardiac silhouette and mediastinal contours.  Interval advancement of endotracheal tube with tip now approximately 2 cm above the carina.  Otherwise, stable position of support apparatus.  No definite pneumothorax.  Cephalization of flow.  Interval increase in now small to moderate- sized right-sided pleural effusion and likely small left-sided pleural effusion.  Worsening bibasilar opacities. Minimal decrease in right upper and left mid lung heterogeneous air space opacities. Unchanged bones.  IMPRESSION: 1.  Interval advancement endotracheal tube with tip now approximately 2 cm above the carina.  Otherwise, stable position of support apparatus.  No pneumothorax. 2.  Minimal decrease in right upper and left mid lung heterogeneous air space opacities suggestive of  minimally improved infection and/or atelectasis. 3.  Overall findings most suggestive of worsening pulmonary edema with interval increase in bilateral pleural effusions and associated bibasilar opacities, right greater than left, possibly atelectasis.   Original Report Authenticated By: Tacey Ruiz, MD    Dg Chest Portable 1 View  06/07/2012  *RADIOLOGY REPORT*  Clinical Data: Pressure ulcer.  Acute  respiratory failure. Multiple sclerosis.  PORTABLE CHEST - 1 VIEW  Comparison: 06/06/2012  Findings: Endotracheal tube is now seen in place with tip approximately 4 cm above the carina. A new left subclavian center venous catheter is seen with tip overlying the superior cavoatrial junction.  No evidence of pneumothorax. New nasogastric is also seen with tip in the proximal stomach.  Mild worsening of the diffuse bilateral pulmonary air space disease is seen.  Small right pleural effusion shows no significant change. Heart size is stable allowing for patient positioning.  IMPRESSION:  1. New endotracheal tube and left subclavian center venous catheter in appropriate position.  No evidence of pneumothorax. 2.  Mild worsening of symmetric bilateral airspace disease. 3.  Stable small right pleural effusion.   Original Report Authenticated By: Myles Rosenthal, M.D.    Dg Chest Portable 1 View  06/06/2012  *RADIOLOGY REPORT*  Clinical Data: Shortness of breath; weakness.  PORTABLE CHEST - 1 VIEW  Comparison: Chest radiograph performed 03/27/2009  Findings: There is relatively diffuse bilateral airspace opacification, with some degree of central sparing, raising suspicion for multifocal pneumonia.  A small to moderate right- sided pleural effusion is seen.  No pneumothorax is identified.  The cardiomediastinal silhouette is within normal limits.  No acute osseous abnormalities are seen.  IMPRESSION: Relatively diffuse bilateral airspace opacification raises concern for multifocal pneumonia.  Associated new small to moderate right- sided pleural effusion seen.   Original Report Authenticated By: Tonia Ghent, M.D.     Medications:    . antiseptic oral rinse  1 application Mouth Rinse QID  . chlorhexidine  15 mL Mouth/Throat BID  . collagenase   Topical Daily  . feeding supplement (OSMOLITE 1.2 CAL)  1,000 mL Per Tube Q24H  . influenza  inactive virus vaccine  0.5 mL Intramuscular Tomorrow-1000  . levofloxacin  (LEVAQUIN) IV  750 mg Intravenous QHS  . multivitamin  5 mL Per Tube Daily  . pantoprazole (PROTONIX) IV  40 mg Intravenous QHS  . piperacillin-tazobactam (ZOSYN)  IV  3.375 g Intravenous Q8H  . pneumococcal 23 valent vaccine  0.5 mL Intramuscular Tomorrow-1000  . potassium chloride  10 mEq Intravenous Q1 Hr x 6  . sodium chloride  1,000 mL Intravenous Once  . vancomycin  1,000 mg Intravenous QHS      . dextrose 5 % and 0.45% NaCl 20 mL/hr at 06/07/12 1616  . fentaNYL infusion INTRAVENOUS 100 mcg/hr (06/07/12 1800)  . midazolam (VERSED) infusion    . norepinephrine (LEVOPHED) Adult infusion 15 mcg/min (06/08/12 0700)  . [DISCONTINUED] DOPamine 15 mcg/kg/min (06/07/12 1000)  . [DISCONTINUED] norepinephrine (LEVOPHED) Adult infusion 17 mcg/min (06/07/12 2301)   Assessment/Plan 1. Altered mental status with metabolic encephalopathy.  2. Probable community-acquired pneumonia bilateral with acute respiratory failure; ventilator dependent.  3. 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  4. Sepsis with acidosis, 2.3 lactic acid; WBC 29,500. On Pressors.  5. Severe anemia with a hemoglobin 2.8/hct 10.3  6. Thrombocytopenia with platelet count of [address].  7. Acute liver failure INR 2.6  8. Bilateral decubitus, minimal on the left. Large 6 x 7 x 4 cm area  of the right ischium with 100% slough.  9. Malnutrition and deconditioning 10. Hypothermia   Plan:  She is much better than yesterday.  Still has significant thrombocytopenia.  She will need debridement and isn't septic from decubitus.  Dr. Daphine Deutscher would like to see her more stable and do debridement in the OR where he has more control.  Continue dressing changes, she is on an air mattress already.    LOS: 2 days    Trayvon Trumbull 06/08/2012

## 2012-06-08 NOTE — Progress Notes (Signed)
Nutrition Brief Note:  RD following for high risk of refeeding.  No new Mg and Phos since starting TFs yesterday.  Pt has been placed on liquid MVI to supplement TFs.  Slow advancement ongoing to manage refeeding potential.  Sodium  Date/Time Value Range Status  06/08/2012  4:32 AM 142  135 - 145 mEq/L Final  06/06/2012 10:40 PM 143  135 - 145 mEq/L Final  06/06/2012  9:05 PM 143  135 - 145 mEq/L Final    Potassium  Date/Time Value Range Status  06/08/2012  4:32 AM 3.3* 3.5 - 5.1 mEq/L Final  06/06/2012 10:40 PM 3.6  3.5 - 5.1 mEq/L Final  06/06/2012  9:05 PM 4.1  3.5 - 5.1 mEq/L Final    Phosphorus  Date/Time Value Range Status  06/07/2012  1:25 AM 5.8* 2.3 - 4.6 mg/dL Final    Magnesium  Date/Time Value Range Status  06/07/2012  1:25 AM 2.2  1.5 - 2.5 mg/dL Final   Pt remains intubated.  Currently receiving Osmolite 1.2 @ 15 mL/hr.  Pt is on pace to reach goal rate of 35 mL/hr tomorrow night.  Per RN, no plans to extubate.   Recommend that if pt is extubated, OGT/NGT remain in place due to difficulty for pt in meeting nutrition needs at home with weakness, TMJ/swallowing difficulty, and poor appetite.    Loyce Dys, MS RD LDN Clinical Inpatient Dietitian Pager: [phone] Weekend/After hours pager: [phone]

## 2012-06-09 ENCOUNTER — Inpatient Hospital Stay (HOSPITAL_COMMUNITY): Payer: Managed Care, Other (non HMO)

## 2012-06-09 DIAGNOSIS — E46 Unspecified protein-calorie malnutrition: Secondary | ICD-10-CM | POA: Diagnosis present

## 2012-06-09 DIAGNOSIS — I420 Dilated cardiomyopathy: Secondary | ICD-10-CM | POA: Diagnosis present

## 2012-06-09 DIAGNOSIS — Z8659 Personal history of other mental and behavioral disorders: Secondary | ICD-10-CM

## 2012-06-09 DIAGNOSIS — N390 Urinary tract infection, site not specified: Secondary | ICD-10-CM

## 2012-06-09 DIAGNOSIS — R579 Shock, unspecified: Secondary | ICD-10-CM | POA: Diagnosis present

## 2012-06-09 LAB — COMPREHENSIVE METABOLIC PANEL
ALT: 61 U/L — ABNORMAL HIGH (ref 0–35)
AST: 80 U/L — ABNORMAL HIGH (ref 0–37)
Albumin: 1.5 g/dL — ABNORMAL LOW (ref 3.5–5.2)
Alkaline Phosphatase: 148 U/L — ABNORMAL HIGH (ref 39–117)
BUN: 13 mg/dL (ref 6–23)
CO2: 27 mEq/L (ref 19–32)
Calcium: 7.5 mg/dL — ABNORMAL LOW (ref 8.4–10.5)
Chloride: 110 mEq/L (ref 96–112)
Creatinine, Ser: 0.5 mg/dL (ref 0.50–1.10)
GFR calc Af Amer: >90 mL/min (ref >90)
GFR calc non Af Amer: >90 mL/min (ref >90)
Glucose, Bld: 68 mg/dL — ABNORMAL LOW (ref 70–99)
Potassium: 3.7 mEq/L (ref 3.5–5.1)
Sodium: 142 mEq/L (ref 135–145)
Total Bilirubin: 1.3 mg/dL — ABNORMAL HIGH (ref 0.3–1.2)
Total Protein: 4.1 g/dL — ABNORMAL LOW (ref 6.0–8.3)

## 2012-06-09 LAB — BLOOD GAS, ARTERIAL
Acid-Base Excess: 1.6 mmol/L (ref 0.0–2.0)
Bicarbonate: 26.4 mEq/L — ABNORMAL HIGH (ref 20.0–24.0)
Drawn by: 317871
FIO2: 0.3 %
MECHVT: 0.4 mL
O2 Saturation: 99.4 %
PEEP: 5 cmH2O
Patient temperature: 96.4
RATE: 14 resp/min
TCO2: 24.1 mmol/L (ref 0–100)
pCO2 arterial: 42.2 mmHg (ref 35.0–45.0)
pH, Arterial: 7.406 (ref 7.350–7.450)
pO2, Arterial: 122 mmHg — ABNORMAL HIGH (ref 80.0–100.0)

## 2012-06-09 LAB — URINE CULTURE: Colony Count: >=100000

## 2012-06-09 LAB — CBC
HCT: 33.3 % — ABNORMAL LOW (ref 36.0–46.0)
Hemoglobin: 11.4 g/dL — ABNORMAL LOW (ref 12.0–15.0)
MCH: 26.8 pg (ref 26.0–34.0)
MCHC: 34.2 g/dL (ref 30.0–36.0)
MCV: 78.4 fL (ref 78.0–100.0)
Platelets: 17 10*3/uL — CL (ref 150–400)
RBC: 4.25 MIL/uL (ref 3.87–5.11)
RDW: 21.7 % — ABNORMAL HIGH (ref 11.5–15.5)
WBC: 24.6 10*3/uL — ABNORMAL HIGH (ref 4.0–10.5)

## 2012-06-09 LAB — GLUCOSE, CAPILLARY
Glucose-Capillary: 135 mg/dL — ABNORMAL HIGH (ref 70–99)
Glucose-Capillary: 65 mg/dL — ABNORMAL LOW (ref 70–99)
Glucose-Capillary: 66 mg/dL — ABNORMAL LOW (ref 70–99)
Glucose-Capillary: 70 mg/dL (ref 70–99)
Glucose-Capillary: 73 mg/dL (ref 70–99)
Glucose-Capillary: 82 mg/dL (ref 70–99)
Glucose-Capillary: 84 mg/dL (ref 70–99)

## 2012-06-09 LAB — PROCALCITONIN: Procalcitonin: 1.88 ng/mL

## 2012-06-09 MED ORDER — DEXTROSE 50 % IV SOLN
25.0000 mL | Freq: Once | INTRAVENOUS | Status: AC | PRN
  Administered 2012-06-09: 25 mL via INTRAVENOUS

## 2012-06-09 MED ORDER — MIDAZOLAM HCL 5 MG/ML IJ SOLN: 2.0000 mg | INTRAMUSCULAR | Status: DC | PRN

## 2012-06-09 MED ORDER — OSMOLITE 1.2 CAL PO LIQD
1000.0000 mL | ORAL | Status: DC
  Administered 2012-06-09: 1000 mL

## 2012-06-09 MED ORDER — SODIUM CHLORIDE 0.9 % IV SOLN
INTRAVENOUS | Status: DC
  Administered 2012-06-09: 20 mL via INTRAVENOUS

## 2012-06-09 MED ORDER — COLLAGENASE 250 UNIT/GM EX OINT: TOPICAL_OINTMENT | Freq: Every day | CUTANEOUS | Status: DC

## 2012-06-09 MED ORDER — FREE WATER
100.0000 mL | Freq: Three times a day (TID) | Status: DC
  Administered 2012-06-09 – 2012-06-10 (×3): 100 mL

## 2012-06-09 MED ORDER — DEXTROSE 50 % IV SOLN
INTRAVENOUS | Status: AC
  Administered 2012-06-09: 25 mL via INTRAVENOUS
  Filled 2012-06-09: qty 50

## 2012-06-09 MED ORDER — COLLAGENASE 250 UNIT/GM EX OINT
TOPICAL_OINTMENT | Freq: Every day | CUTANEOUS | Status: DC
  Administered 2012-06-09 – 2012-06-10 (×2): via TOPICAL
  Administered 2012-06-11: 1 via TOPICAL
  Administered 2012-06-12 – 2012-06-20 (×7): via TOPICAL
  Filled 2012-06-09: qty 30

## 2012-06-09 NOTE — Progress Notes (Signed)
Subjective: No real change, still on vent.  We plan to do some debridement of the worst of the slough later this Am at bedside..  Objective: Vital signs in last 24 hours: Temp:  [95.7 F (35.4 C)-96.8 F (36 C)] 95.7 F (35.4 C) (12/05 0730) Pulse Rate:  [80-90] 85  (12/05 0730) Resp:  [14-18] 15  (12/05 0730) BP: (80-106)/(40-77) 99/62 mmHg (12/05 0730) SpO2:  [99 %-100 %] 100 % (12/05 0730) FiO2 (%):  [30 %] 30 % (12/05 0600) Weight:  [105 lb 6.1 oz (47.8 kg)] 105 lb 6.1 oz (47.8 kg) (12/05 0500)    hypothermic BP 90-100's range on pressors, WBC is up still, platelets  17000  Intake/Output from previous day: 12/04 0701 - 12/05 0700 In: 2359.8 [I.V.:1002.3; NG/GT:115; IV Piggyback:972.5] Out: 1605 [Urine:1605] Intake/Output this shift:    General appearance: alert, cooperative and on vent Incision/Wound:Debrided area on Right buttocks, foul smelling all of the tissue is dead that I removed.  Lab Results:   [facility] 06/09/12 0338 06/08/12 0432  WBC 24.6* 20.1*  HGB 11.4* 11.5*  HCT 33.3* 34.2*  PLT 17* 23*    BMET  Basename 06/09/12 0338 06/08/12 0432  NA 142 142  K 3.7 3.3*  CL 110 109  CO2 27 23  GLUCOSE 68* 102*  BUN 13 20  CREATININE 0.50 0.60  CALCIUM 7.5* 6.8*   PT/INR  Basename 06/08/12 0432 06/06/12 2105  LABPROT 19.7* 26.6*  INR 1.73* 2.60*     Lab 06/09/12 0338 06/08/12 0432 06/06/12 2240 06/06/12 2105  AST 80* 102* 99* 110*  ALT 61* 69* 69* 78*  ALKPHOS 148* 159* 176* 199*  BILITOT 1.3* 1.3* 0.7 0.8  PROT 4.1* 4.5* 3.8* 4.2*  ALBUMIN 1.5* 1.7* 1.5* 1.7*     Lipase  No results found for this basename: lipase     Studies/Results: Dg Abd 1 View  06/08/2012  *RADIOLOGY REPORT*  Clinical Data: Abdominal distension and  ABDOMEN - 1 VIEW  Comparison: 06/06/2012  Findings: There is a single loop of prominent small bowel in the left mid abdomen.  The caliber of the remaining bowel including the colon is normal with moderate stool in the  colon.  The nasogastric extends into the stomach with the tip at the junction of the body and neck the fundus.  IMPRESSION: The colon  no longer appears distended.  There is a single dilated loop of small bowel in the the left mid abdomen.  A nasogastric tube is in place with the tip in the stomach.   Original Report Authenticated By: Sander Radon, M.D.    Dg Chest Port 1 View  06/09/2012  *RADIOLOGY REPORT*  Clinical Data: Evaluate endotracheal tube  PORTABLE CHEST - 1 VIEW  Comparison: 06/08/2012; 06/07/2012; 06/06/2012  Findings:  Grossly unchanged cardiac silhouette and mediastinal contours. Stable position of support apparatus.  No definite pneumothorax. Overall improved aeration of the bilateral lung bases favored to represent interval layering of small bilateral pleural effusions. Scattered bilateral heterogeneous opacities are otherwise grossly unchanged.  No new focal airspace opacities.  Unchanged bones.  IMPRESSION: 1.  Stable positioning of support apparatus.  No pneumothorax. 2.  Interval layering of persistent small bilateral effusions. 3.  Grossly unchanged bilateral air space opacities worrisome for multifocal infection.  4.  Likely unchanged mild pulmonary edema.   Original Report Authenticated By: Tacey Ruiz, MD    Dg Chest Port 1 View  06/08/2012  *RADIOLOGY REPORT*  Clinical Data: Evaluate endotracheal tube  PORTABLE  CHEST - 1 VIEW  Comparison: 06/07/2012; 06/06/2012; 03/27/2009  Findings: Grossly unchanged cardiac silhouette and mediastinal contours.  Interval advancement of endotracheal tube with tip now approximately 2 cm above the carina.  Otherwise, stable position of support apparatus.  No definite pneumothorax.  Cephalization of flow.  Interval increase in now small to moderate- sized right-sided pleural effusion and likely small left-sided pleural effusion.  Worsening bibasilar opacities. Minimal decrease in right upper and left mid lung heterogeneous air space opacities.  Unchanged bones.  IMPRESSION: 1.  Interval advancement endotracheal tube with tip now approximately 2 cm above the carina.  Otherwise, stable position of support apparatus.  No pneumothorax. 2.  Minimal decrease in right upper and left mid lung heterogeneous air space opacities suggestive of minimally improved infection and/or atelectasis. 3.  Overall findings most suggestive of worsening pulmonary edema with interval increase in bilateral pleural effusions and associated bibasilar opacities, right greater than left, possibly atelectasis.   Original Report Authenticated By: Tacey Ruiz, MD     Medications:    . antiseptic oral rinse  1 application Mouth Rinse QID  . chlorhexidine  15 mL Mouth/Throat BID  . collagenase   Topical Daily  . feeding supplement (OSMOLITE 1.2 CAL)  1,000 mL Per Tube Q24H  . influenza  inactive virus vaccine  0.5 mL Intramuscular Tomorrow-1000  . levofloxacin (LEVAQUIN) IV  750 mg Intravenous QHS  . multivitamin  5 mL Per Tube Daily  . pantoprazole (PROTONIX) IV  40 mg Intravenous QHS  . pneumococcal 23 valent vaccine  0.5 mL Intramuscular Tomorrow-1000  . [COMPLETED] potassium chloride  10 mEq Intravenous Q1 Hr x 6  . sodium chloride  1,000 mL Intravenous Once  . vancomycin  1,000 mg Intravenous QHS  . [DISCONTINUED] piperacillin-tazobactam (ZOSYN)  IV  3.375 g Intravenous Q8H      . dextrose 5 % and 0.45% NaCl 20 mL/hr at 06/07/12 1616  . fentaNYL infusion INTRAVENOUS 75 mcg/hr (06/08/12 2030)  . midazolam (VERSED) infusion    . milrinone 0.125 mcg/kg/min (06/08/12 1521)  . norepinephrine (LEVOPHED) Adult infusion 15 mcg/min (06/08/12 2340)   Assessment/Plan 1. Altered mental status with metabolic encephalopathy. Improving. 2. Probable community-acquired pneumonia bilateral with acute respiratory failure; ventilator dependent.  3. 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  4. Sepsis with acidosis, 2.3 lactic acid; WBC 29,500. On  Pressors.  5. Severe anemia with a hemoglobin 2.8/hct 10.3  6. Thrombocytopenia [place]  [address] today. 7. Acute liver failure INR 2.6 on admit 8. Bilateral decubitus, minimal on the left. Large 6 x 7 x 4 cm area of the right ischium with 100% slough.  9. Malnutrition and deconditioning  10. Hypothermia    Plan: We did a sharpe debridement of the right ischium 6 x 7 x 4 cm decubitus.  We used the IV pain fentanyl  Already ordered for pain.  We used her sensation as a guide on how far and deep to go.  We will plan to start santyl in this area and wet to dry dressings bid/prn.  Start Diplomatic Services operational officer.       LOS: 3 days    Shraddha Lebron 06/09/2012

## 2012-06-09 NOTE — Progress Notes (Signed)
PULMONARY  / CRITICAL CARE MEDICINE  Name: [name] MRN: [id number] DOB: [birth year]    LOS: 3  REFERRING MD :  EDP  CHIEF COMPLAINT:  FTT and AMS  BRIEF PATIENT DESCRIPTION: 51 yo with limited mobility due to neuromuscular disease (?MS) last seen by Neurologist about 2 years ago brought to [facility] ED with 3 d h/o altered mental status, and FTT X 3 mo. In ED: hypotensive, hypoglycemic, hypothermic, bradycardic, Hb 3.0, WBC 30.  SIGNIFICANT EVENTS:  12/04 transfused 4 units PRBCs 12/04 Echo: EF 20-25%, diffuse HK 12/04 KUB: There is a single dilated loop of small bowel in the the left mid abdomen 12/05 Abd [place]: Bilateral pleural effusions and moderate ascites. Fatty infiltration of the liver. Biliary sludge without stones or acute changes  LINES / TUBES:  12/3 ETT>>> 12/3 L [place] CVL >>>  CULTURES:   12/3 Urine: enterococcus (pansens) 12/3 Respiratory >> mod GNRs >>  12/3 Blood >>  ANTIBIOTICS:  Zosyn 12/3 >>> 12/4 Vancomycin 12/3 >>> 12/08 Levaquin 12/3 >>>   INTERVAL HISTORY:  Awake, alert, + F/C, remains profoundly weak  VITAL SIGNS: Temp:  [95.7 F (35.4 C)-96.8 F (36 C)] 96.1 F (35.6 C) (12/05 1000) Pulse Rate:  [80-91] 89  (12/05 1000) Resp:  [14-18] 14  (12/05 1000) BP: (80-115)/(40-77) 99/63 mmHg (12/05 1000) SpO2:  [99 %-100 %] 100 % (12/05 1000) FiO2 (%):  [30 %] 30 % (12/05 1000) Weight:  [47.8 kg (105 lb 6.1 oz)] 47.8 kg (105 lb 6.1 oz) (12/05 0500) HEMODYNAMICS: CVP:  [4 mmHg-8 mmHg] 4 mmHg VENTILATOR SETTINGS: Vent Mode:  [-] PRVC FiO2 (%):  [30 %] 30 % Set Rate:  [14 bmp] 14 bmp Vt Set:  [400 mL] 400 mL PEEP:  [5 cmH20] 5 cmH20 Pressure Support:  [10 cmH20] 10 cmH20 Plateau Pressure:  [18 cmH20-21 cmH20] 18 cmH20 INTAKE / OUTPUT: Intake/Output      12/04 0701 - 12/05 0700 12/05 0701 - 12/06 0700   I.V. (mL/kg) 1002.3 (21) 112 (2.3)   Blood     Other 270 40   NG/GT 115 10   IV Piggyback 972.5    Total Intake(mL/kg) 2359.8 (49.4) 162  (3.4)   Urine (mL/kg/hr) 1605 (1.4) 175 (0.8)   Total Output 1605 175   Net +754.8 -13          PHYSICAL EXAMINATION: General:  Frail 51 year old female. Acute on chronically ill  Neuro:  RASS 0, + F/C, diffusely profoundly weak HEENT:  Temporal wasting. Orally intubated  Cardiovascular:  RRR s M Lungs:  No wheezes anteriorly, dependent rales Abdomen:  Firm, + bowel sounds  Musculoskeletal:  weak Skin:  + anasarca, Lower extremity erythremic rash, c/w cellulitis. Sacral wound dressing intact     LABS: Cbc  Lab 06/09/12 0338 06/08/12 0432 06/07/12 1325  WBC 24.6* -- --  HGB 11.4* 11.5* 11.7*  HCT 33.3* 34.2* 34.6*  PLT 17* 23* 30*    Chemistry   Lab 06/09/12 0338 06/08/12 0432 06/07/12 0125 06/06/12 2240  NA 142 142 -- 143  K 3.7 3.3* -- 3.6  CL 110 109 -- 110  CO2 27 23 -- 21  BUN 13 20 -- 33*  CREATININE 0.50 0.60 -- 0.84  CALCIUM 7.5* 6.8* -- 6.8*  MG -- -- 2.2 --  PHOS -- -- 5.8* --  GLUCOSE 68* 102* -- 184*    Liver fxn  Lab 06/09/12 0338 06/08/12 0432 06/06/12 2240  AST 80* 102* 99*  ALT  61* 69* 69*  ALKPHOS 148* 159* 176*  BILITOT 1.3* 1.3* 0.7  PROT 4.1* 4.5* 3.8*  ALBUMIN 1.5* 1.7* 1.5*   coags  Lab 06/08/12 0432 06/06/12 2105  APTT -- 69*  INR 1.73* 2.60*   Sepsis markers  Lab 06/09/12 0337 06/08/12 0432 06/08/12 0422 06/07/12 1255 06/07/12 0125  LATICACIDVEN -- -- 1.9 3.0* 1.9  PROCALCITON 1.88 1.10 -- -- 0.57   Cardiac markers  Lab 06/06/12 2105  CKTOTAL --  CKMB --  TROPONINI <0.30   BNP No results found for this basename: PROBNP:3 in the last 168 hours ABG  Lab 06/09/12 0416 06/08/12 0422 06/07/12 0035  PHART 7.406 7.388 7.367  PCO2ART 42.2 37.6 36.7  PO2ART 122.0* 156.0* 120.0*  HCO3 26.4* 22.9 20.5  TCO2 24.1 21.0 19.7    CBG trend  Lab 06/09/12 0755 06/09/12 0526 06/09/12 0502 06/09/12 0410 06/09/12 0013  GLUCAP 70 135* 66* 65* 84    IMAGING: ETT in good position. Increased bilateral airspace disease. Looks  like element of edema   ECG:  DIAGNOSES: Principal Problem:  *Acute respiratory failure Active Problems:  CAP (community acquired pneumonia) vs aspiration pneumonia   Thrombocytopenia  Neuromuscular disorder: not fully defined.   Shock circulatory, suspect primarily cardiogenic  Dilated cardiomyopathy  Malnutrition compromising bodily function  Anemia  Coagulopathy  Acute liver failure  Acute encephalopathy, resolved  H/O anorexia nervosa  Enterococcal UTI (pansensitive)   ASSESSMENT / PLAN:  PULMONARY  A:  Acute respiratory failure.  CAP vs aspiration pneumonia.  Pleural effusion (R). Aeration worse. Prob component of mild ALI and low oncotic pressure edema  Vs pulmonary edema d/t CM P:   Cont to wean in PSV mode as tolerated   CARDIOVASCULAR  A:  ? septic shock:  cardiomyopathy: likely on basis of profound malnutrition   P:  Cont NE, wean as tolerated for MAP > 60 mmHg Cont milrinone  RENAL  A:  Normal renal function / electrolytes. Hypokalemia  P:   Cont IVFs Replace and recheck lytes as indicated  F/u chem  GASTROINTESTINAL  A:   Severe malnutrition.   Liver failure (acute vs chronic). At risk for refeeding syndrome.   abd distention  P:   Nutritionist consult Cont tube feeds  KUB and abd [place] results as above   HEMATOLOGIC  A:   Thrombocytopenia, severe Anemia.  Improved after RBCs - no evidence of acute blood loss. Coagulopathy.  P:  Monitor CBC, coags. Blood products as needed D/C Vanc as might contribute to thrombocytopenia   INFECTIOUS  A:   CAP  Infected sacral decubitus. LE cellulitis UTI-->enterococcus  P:   Cultures and antibiotics as above PCT reviewed Wound care team following    ENDOCRINE   A:   Hyperglycemia, resolved Mild hypothyroidism  Was hypoglycemic. This has resolved.  P: CBG to q 8 hrs Recheck TSH in week or so. No levothyroxine for now    NEUROLOGIC  A:   Chronic neuromuscular disese, poorly  defined. Neuro following  Acute encephalopathy, resolved.  P:   Goal RASS 0 to -1 Fentanyl gtt F/u anti-GAD AB     CRITICAL CARE:  The patient is critically ill with multiple organ systems failure and requires high complexity decision making for assessment and support, frequent evaluation and titration of therapies, application of advanced monitoring technologies and extensive interpretation of multiple databases. Critical Care Time devoted to patient care services described in this note is 35 minutes.      Billy Fischer, MD ;  PCCM service Mobile [phone].  After 5:30 PM or weekends, call [phone]

## 2012-06-09 NOTE — Progress Notes (Signed)
Hypoglycemic Event  CBG: 66  Treatment: D50 IV 25 mL  Symptoms: None  Follow-up CBG: Time:0525 CBG Result:135  Possible Reasons for Event: Other: Tube feeds held for abd ultrasound  Comments/MD notified:Pt alert, CBG improved after D50 administration    Iona Beard  Remember to initiate Hypoglycemia Order Set & complete

## 2012-06-09 NOTE — Progress Notes (Signed)
CRITICAL VALUE ALERT  Critical value received:  Platelets  Date of notification:  06/09/12  Time of notification:  0408  Critical value read back:yes  Nurse who received alert:  Kinnie Feil, RN  MD notified (1st page):  Berton Lan MD)  Time of first page:  [phone]  MD notified (2nd page):  Time of second page:  Responding MD:  Pola Corn MD  Time MD responded:  [id number]; no new orders given

## 2012-06-10 ENCOUNTER — Inpatient Hospital Stay (HOSPITAL_COMMUNITY): Payer: Managed Care, Other (non HMO)

## 2012-06-10 LAB — GLUCOSE, CAPILLARY
Glucose-Capillary: 72 mg/dL (ref 70–99)
Glucose-Capillary: 84 mg/dL (ref 70–99)
Glucose-Capillary: 92 mg/dL (ref 70–99)
Glucose-Capillary: 98 mg/dL (ref 70–99)

## 2012-06-10 LAB — CBC
HCT: 32.8 % — ABNORMAL LOW (ref 36.0–46.0)
Hemoglobin: 10.8 g/dL — ABNORMAL LOW (ref 12.0–15.0)
MCH: 26.2 pg (ref 26.0–34.0)
MCHC: 32.9 g/dL (ref 30.0–36.0)
MCV: 79.6 fL (ref 78.0–100.0)
Platelets: 25 10*3/uL — CL (ref 150–400)
RBC: 4.12 MIL/uL (ref 3.87–5.11)
RDW: 22.7 % — ABNORMAL HIGH (ref 11.5–15.5)
WBC: 26.7 10*3/uL — ABNORMAL HIGH (ref 4.0–10.5)

## 2012-06-10 LAB — BASIC METABOLIC PANEL
BUN: 10 mg/dL (ref 6–23)
CO2: 28 mEq/L (ref 19–32)
Calcium: 8 mg/dL — ABNORMAL LOW (ref 8.4–10.5)
Chloride: 110 mEq/L (ref 96–112)
Creatinine, Ser: 0.46 mg/dL — ABNORMAL LOW (ref 0.50–1.10)
GFR calc Af Amer: >90 mL/min (ref >90)
GFR calc non Af Amer: >90 mL/min (ref >90)
Glucose, Bld: 95 mg/dL (ref 70–99)
Potassium: 3.4 mEq/L — ABNORMAL LOW (ref 3.5–5.1)
Sodium: 141 mEq/L (ref 135–145)

## 2012-06-10 LAB — CULTURE, RESPIRATORY W GRAM STAIN

## 2012-06-10 LAB — GLUTAMIC ACID DECARBOXYLASE AUTO ABS: Glutamic Acid Decarb Ab: <1 U/mL (ref <=1.0)

## 2012-06-10 LAB — APTT: aPTT: 39 seconds — ABNORMAL HIGH (ref 24–37)

## 2012-06-10 LAB — IRON AND TIBC
Iron: 13 ug/dL — ABNORMAL LOW (ref 42–135)
Saturation Ratios: 6 % — ABNORMAL LOW (ref 20–55)
TIBC: 210 ug/dL — ABNORMAL LOW (ref 250–470)
UIBC: 197 ug/dL (ref 125–400)

## 2012-06-10 LAB — PROTIME-INR
INR: 1.46 (ref 0.00–1.49)
Prothrombin Time: 17.3 seconds — ABNORMAL HIGH (ref 11.6–15.2)

## 2012-06-10 LAB — VITAMIN B12: Vitamin B-12: >2000 pg/mL — ABNORMAL HIGH (ref 211–911)

## 2012-06-10 LAB — FERRITIN: Ferritin: 296 ng/mL — ABNORMAL HIGH (ref 10–291)

## 2012-06-10 MED ORDER — FUROSEMIDE 10 MG/ML IJ SOLN
20.0000 mg | Freq: Once | INTRAMUSCULAR | Status: AC
  Administered 2012-06-10: 20 mg via INTRAVENOUS
  Filled 2012-06-10: qty 2

## 2012-06-10 MED ORDER — FENTANYL CITRATE 0.05 MG/ML IJ SOLN: 12.5000 ug | INTRAMUSCULAR | Status: DC | PRN

## 2012-06-10 MED ORDER — POTASSIUM CHLORIDE 10 MEQ/50ML IV SOLN
10.0000 meq | INTRAVENOUS | Status: AC
  Administered 2012-06-10 (×2): 10 meq via INTRAVENOUS
  Filled 2012-06-10: qty 100

## 2012-06-10 MED ORDER — POTASSIUM CHLORIDE 2 MEQ/ML IV SOLN
INTRAVENOUS | Status: DC
  Administered 2012-06-10 – 2012-06-13 (×2): via INTRAVENOUS
  Filled 2012-06-10 (×6): qty 1000

## 2012-06-10 MED ORDER — FENTANYL CITRATE 0.05 MG/ML IJ SOLN
12.5000 ug | INTRAMUSCULAR | Status: DC | PRN
  Administered 2012-06-10 – 2012-06-12 (×4): 12.5 ug via INTRAVENOUS
  Administered 2012-06-13: 100 ug via INTRAVENOUS
  Filled 2012-06-10 (×6): qty 2

## 2012-06-10 MED ORDER — POTASSIUM CHLORIDE 20 MEQ/15ML (10%) PO LIQD
40.0000 meq | Freq: Once | ORAL | Status: AC
  Administered 2012-06-10: 40 meq
  Filled 2012-06-10: qty 30

## 2012-06-10 NOTE — Progress Notes (Signed)
PULMONARY  / CRITICAL CARE MEDICINE  Name: [name] MRN: [id number] DOB: Nov 16, 1960    LOS: 4  REFERRING MD :  EDP  CHIEF COMPLAINT:  FTT and AMS  BRIEF PATIENT DESCRIPTION: 51 yo with limited mobility due to neuromuscular disease (?MS) last seen by Neurologist about 2 years ago brought to [facility] ED with 3 d h/o altered mental status, and FTT X 3 mo. In ED: hypotensive, hypoglycemic, hypothermic, bradycardic, Hb 3.0, WBC 30.  SIGNIFICANT EVENTS:  12/04 transfused 4 units PRBCs 12/04 Echo: EF 20-25%, diffuse HK 12/04 KUB: There is a single dilated loop of small bowel in the the left mid abdomen 12/05 Abd [place]: Bilateral pleural effusions and moderate ascites. Fatty infiltration of the liver. Biliary sludge without stones or acute changes  LINES / TUBES:  12/3 ETT >> 12/06 12/3 L [place] CVL >>   CULTURES:   12/3 Urine: enterococcus (pansens) 12/3 Respiratory >> serratia 12/2 Blood >>   ANTIBIOTICS:  Zosyn 12/3 >>> 12/4 Vancomycin 12/3 >>> 12/08 Levaquin 12/3 >>>   INTERVAL HISTORY:  Awake, alert, + F/C, remains profoundly weak. Passed SBT  VITAL SIGNS: Temp:  [97.6 F (36.4 C)-98 F (36.7 C)] 98 F (36.7 C) (12/06 0800) Pulse Rate:  [88-103] 103  (12/06 1130) Resp:  [11-20] 16  (12/06 1130) BP: (82-126)/(42-80) 101/70 mmHg (12/06 1130) SpO2:  [94 %-100 %] 94 % (12/06 1130) FiO2 (%):  [30 %] 30 % (12/06 0807) Weight:  [50.3 kg (110 lb 14.3 oz)] 50.3 kg (110 lb 14.3 oz) (12/06 0930) HEMODYNAMICS: CVP:  [6 mmHg] 6 mmHg VENTILATOR SETTINGS: Vent Mode:  [-] PSV;CPAP FiO2 (%):  [30 %] 30 % Set Rate:  [14 bmp] 14 bmp Vt Set:  [400 mL] 400 mL PEEP:  [5 cmH20] 5 cmH20 Pressure Support:  [12 cmH20] 12 cmH20 Plateau Pressure:  [17 cmH20-20 cmH20] 20 cmH20 INTAKE / OUTPUT: Intake/Output      12/05 0701 - 12/06 0700 12/06 0701 - 12/07 0700   I.V. (mL/kg) 1367.3 (28.6) 243.8 (4.8)   Other 220    NG/GT 575 50   IV Piggyback 150 100   Total Intake(mL/kg) 2312.3 (48.4)  393.8 (7.8)   Urine (mL/kg/hr) 865 (0.8) 300 (1.3)   Total Output 865 300   Net +1447.3 +93.8          PHYSICAL EXAMINATION: General:  Frail 51 year old female. Acute on chronically ill  Neuro:  RASS 0, + F/C, diffusely profoundly weak HEENT:  Temporal wasting.  Cardiovascular:  RRR s M Lungs:  No wheezes anteriorly, dependent rales Abdomen:  Firm, + bowel sounds  Musculoskeletal:  weak Skin:  + anasarca, Lower extremity erythremic rash, c/w cellulitis. Sacral wound dressing intact     LABS: Cbc  Lab 06/10/12 0520 06/09/12 0338 06/08/12 0432  WBC 26.7* -- --  HGB 10.8* 11.4* 11.5*  HCT 32.8* 33.3* 34.2*  PLT 25* 17* 23*    Chemistry   Lab 06/10/12 0520 06/09/12 0338 06/08/12 0432 06/07/12 0125  NA 141 142 142 --  K 3.4* 3.7 3.3* --  CL 110 110 109 --  CO2 28 27 23  --  BUN 10 13 20  --  CREATININE 0.46* 0.50 0.60 --  CALCIUM 8.0* 7.5* 6.8* --  MG -- -- -- 2.2  PHOS -- -- -- 5.8*  GLUCOSE 95 68* 102* --    Liver fxn  Lab 06/09/12 0338 06/08/12 0432 06/06/12 2240  AST 80* 102* 99*  ALT 61* 69* 69*  ALKPHOS  148* 159* 176*  BILITOT 1.3* 1.3* 0.7  PROT 4.1* 4.5* 3.8*  ALBUMIN 1.5* 1.7* 1.5*   coags  Lab 06/10/12 0520 06/08/12 0432 06/06/12 2105  APTT 39* -- 69*  INR 1.46 1.73* 2.60*   Sepsis markers  Lab 06/09/12 0337 06/08/12 0432 06/08/12 0422 06/07/12 1255 06/07/12 0125  LATICACIDVEN -- -- 1.9 3.0* 1.9  PROCALCITON 1.88 1.10 -- -- 0.57   Cardiac markers  Lab 06/06/12 2105  CKTOTAL --  CKMB --  TROPONINI <0.30   BNP No results found for this basename: PROBNP:3 in the last 168 hours ABG  Lab 06/09/12 0416 06/08/12 0422 06/07/12 0035  PHART 7.406 7.388 7.367  PCO2ART 42.2 37.6 36.7  PO2ART 122.0* 156.0* 120.0*  HCO3 26.4* 22.9 20.5  TCO2 24.1 21.0 19.7    CBG trend  Lab 06/10/12 0859 06/09/12 2347 06/09/12 2004 06/09/12 1146 06/09/12 0755  GLUCAP 92 98 82 73 70    IMAGING: +/- improved edema and  effusions  ECG:  DIAGNOSES: Principal Problem:  *Acute respiratory failure Active Problems:  CAP (community acquired pneumonia) vs aspiration pneumonia   Thrombocytopenia  Neuromuscular disorder: not fully defined.   Shock circulatory, suspect primarily cardiogenic  Dilated cardiomyopathy  Malnutrition compromising bodily function  Anemia  Coagulopathy  Acute liver failure  Acute encephalopathy, resolved  H/O anorexia nervosa  Enterococcal UTI (pansensitive)   ASSESSMENT / PLAN:  PULMONARY  A:  Acute respiratory failure.  CAP vs aspiration pneumonia.  Pleural effusion (R). Aeration worse. Prob component of mild ALI and low oncotic pressure edema  Vs pulmonary edema d/t CM P:   Extubate 12/06   CARDIOVASCULAR  A:  ? septic shock:  cardiomyopathy: likely on basis of profound malnutrition  Cardiogenic shock  P:  Cont NE, wean as tolerated for MAP > 60 mmHg Cont milrinone   RENAL  A:  Normal renal function / electrolytes. Hypokalemia  An[place]aca P:   Change IVFs Monitor chemistries Consider diuresis once off pressors    GASTROINTESTINAL  A:   Severe malnutrition.   Liver failure (acute vs chronic). At risk for refeeding syndrome.   abd distention  P:   Will consider TPN after extubation as it is unlikely that she will be able to meet her needs PO Also will consider NGT Might need PEG   HEMATOLOGIC  A:   Thrombocytopenia, severe Anemia.  Improved after RBCs - no evidence of acute blood loss. Coagulopathy.  P:  Monitor CBC, coags. Blood products as needed    INFECTIOUS  A:   CAP, serratia Infected sacral decubitus. LE cellulitis UTI-->enterococcus  P:   Cultures and antibiotics as above PCT reviewed Wound care team following    ENDOCRINE   A:   Hyperglycemia, resolved Mild hypothyroidism    P: CBG to q 8 hrs Recheck TSH in week or so. No levothyroxine for now    NEUROLOGIC  A:   Chronic neuromuscular disese, poorly  defined. Neuro following  Acute encephalopathy, resolved.  P:   Minimize sedation, analgesics post extubation     CRITICAL CARE:  The patient is critically ill with multiple organ systems failure and requires high complexity decision making for assessment and support, frequent evaluation and titration of therapies, application of advanced monitoring technologies and extensive interpretation of multiple databases. Critical Care Time devoted to patient care services described in this note is 45 minutes.      Billy Fischer, MD ; [facility] [phone].  After 5:30 PM or weekends, call [phone]

## 2012-06-10 NOTE — Progress Notes (Signed)
Nutrition Brief Note:  Note pt planning for extubation today.  TFs have been stopped in anticipation of extubation. Discussed plans for nutrition support with MD.  Wynelle Fanny order for Osmolite in the event TFs can be resumed over the weekend.  NGT will not be replaced today.  Pt no longer getting MVI due to loss of access.  Suspect pt will not be able to meet nutrition needs PO due to profound weakness and poor appetite.  Pt would greatly benefit from ongoing enteral nutrition support as she is tolerating well.  Sodium  Date/Time Value Range Status  06/10/2012  5:20 AM 141  135 - 145 mEq/L Final  06/09/2012  3:38 AM 142  135 - 145 mEq/L Final  06/08/2012  4:32 AM 142  135 - 145 mEq/L Final    Potassium  Date/Time Value Range Status  06/10/2012  5:20 AM 3.4* 3.5 - 5.1 mEq/L Final  06/09/2012  3:38 AM 3.7  3.5 - 5.1 mEq/L Final  06/08/2012  4:32 AM 3.3* 3.5 - 5.1 mEq/L Final    Phosphorus  Date/Time Value Range Status  06/07/2012  1:25 AM 5.8* 2.3 - 4.6 mg/dL Final    Magnesium  Date/Time Value Range Status  06/07/2012  1:25 AM 2.2  1.5 - 2.5 mg/dL Final    Loyce Dys, MS RD LDN Clinical Inpatient Dietitian Pager: [phone] Weekend/After hours pager: [phone]

## 2012-06-10 NOTE — Progress Notes (Signed)
CARE MANAGEMENT NOTE 06/10/2012  Patient:  NANETTA, WIEGMAN   Account Number:  [id number]  Date Initiated:  06/07/2012  Documentation initiated by:  Jasmaine Rochel  Subjective/Objective Assessment:   pt with resp failure and sepsis on vent     Action/Plan:   has been at home with husband may need snf due to condition and safety.   Anticipated DC Date:  06/13/2012   Anticipated DC Plan:  SKILLED NURSING FACILITY  In-house referral  Clinical Social Worker      DC Planning Services  NA      [facility] Choice  NA   Choice offered to / List presented to:  NA   DME arranged  NA      DME agency  NA     HH arranged  NA      HH agency  NA   Status of service:  In process, will continue to follow Medicare Important Message given?  NA - LOS <3 / Initial given by admissions (If response is "NO", the following Medicare IM given date fields will be blank) Date Medicare IM given:   Date Additional Medicare IM given:    Discharge Disposition:    Per UR Regulation:  Reviewed for med. necessity/level of care/duration of stay  If discussed at Long Length of Stay Meetings, dates discussed:    Comments:  [id number]/Cairo Lingenfelter Earlene Plater, RN, BSN, CCM: CHART REVIEWED AND UPDATED.  Next chart review due on [id number]. NO DISCHARGE NEEDS PRESENT AT THIS TIME.  vent dependant on full support failede 1st weaning attempt. CASE MANAGEMENT [phone]  [id number]/VHQION Earlene Plater, RN, BSN, CCM: CHART REVIEWED AND UPDATED.  Next chart review due on [id number]. NO DISCHARGE NEEDS PRESENT AT THIS TIME. CASE MANAGEMENT [phone]

## 2012-06-10 NOTE — Progress Notes (Signed)
#  14 fr foley dc-leaking. Voided incontinently in bed. #16 fr foley placed to keep sacral wounds clean and dry.

## 2012-06-10 NOTE — Progress Notes (Signed)
PHYSICAL THERAPY HYDRO EVALUATION. [id number]  06/10/12 1120  Subjective Assessment  Subjective pt on vent, does communicate with hands and writes letters in palm. GIVES THUMBS UP  Patient and Family Stated Goals per spouse to heal her wounds.  Date of Onset (per spouse, present x month, worse in last 2 weeks.)  Prior Treatments dressing changes at home by spouse.  Pressure Ulcer 06/07/12 Unstageable - Full thickness tissue loss in which the base of the ulcer is covered by slough (yellow, tan, gray, green or brown) and/or eschar (tan, brown or black) in the wound bed. Large Round eshcar   Date First Assessed/Time First Assessed: 06/07/12 0300   Location: Buttocks  Location Orientation: Right  Staging: Unstageable - Full thickness tissue loss in which the base of the ulcer is covered by slough (yellow, tan, gray, green or brown) and/or esc  State of Healing Eschar  Site / Wound Assessment Brown;Black  % Wound base Red or Granulating 0%  % Wound base Yellow 100%  Peri-wound Assessment Maceration;Pink (several areas surrounding and on L cheek. Allevyn used to th)  Wound Length (cm) 6 cm  Wound Width (cm) 6 cm  Wound Depth (cm) 2 cm  Undermining (cm) at 3:00 1 cm; at 10:00 2cm  Drainage Amount Moderate  Drainage Description Purulent  Treatment Debridement (Selective);Hydrotherapy (Pulse lavage);Packing (Saline gauze) (santyl)  Dressing Type Gauze (Comment);Moist to dry (santyl, small moon shaped allevyn placed at 6:00 ove macerat)  Dressing Clean;Dry  Hydrotherapy  Pulsed Lavage with Suction (psi) 8 psi  Pulsed Lavage with Suction - Normal Saline Used 1000 mL  Pulsed Lavage Tip Tip with splash shield  Pulsed lavage therapy - wound location R sacral  Selective Debridement  Selective Debridement - Location sacral  Selective Debridement - Tools Used Forceps;Scalpel  Selective Debridement - Tissue Removed slough  Wound Therapy - Assess/Plan/Recommendations  Wound Therapy - Clinical  Statement Pt. was admitted 12/02 for sepsis, VDRF, decubitus  ulcer.,non stageable on R sacral area. , Pt. has multilple ulcers on back, feet. Pt has history of neurological  disease but  not MS per spouse. Per spouse, pt has been in decline for 1 month but was ambulatory prior th.Per. spouse . Pt will benefit from PT for wound care. Recommend PT/OT consult for mobility when medically stable.  Wound Therapy - Functional Problem List has been non ambulatory x 1 month, has sat in recliner for full shift while  spouse worked 3rd shift. able to move UE's to write letters in palm for communication as pt. is on Vent.  Factors Delaying/Impairing Wound Healing Infection - systemic/local;Multiple medical problems;Immobility  Hydrotherapy Plan Debridement;Dressing change;Pulsatile lavage with suction  Wound Therapy - Frequency 6X / week  Wound Therapy - Current Recommendations OT;PT  Wound Therapy - Follow Up Recommendations Skilled nursing facility  Wound Plan PLS and selective debridement    Wound Therapy Goals - Improve the function of patient's integumentary system by progressing the wound(s) through the phases of wound healing by:  Decrease Necrotic Tissue to 50  Decrease Necrotic Tissue - Progress Goal set today  Increase Granulation Tissue to 50  Increase Granulation Tissue - Progress Goal set today  Improve Drainage Characteristics Min  Improve Drainage Characteristics - Progress Goal set today  Patient/Family will be able to  position pt. in bed to decrease pressure.  Patient/Family Instruction Goal - Progress Goal set today  Goals/treatment plan/discharge plan were made with and agreed upon by patient/family Yes  Time For Goal Achievement 2 weeks  Wound Therapy - Potential for Goals Good   [name] PT [phone]

## 2012-06-10 NOTE — Procedures (Signed)
Extubation Procedure Note  Patient Details:   Name: [name] DOB: 1960/09/24 MRN: [id number]   Airway Documentation:     Evaluation  O2 sats: stable throughout Complications: No apparent complications Patient did tolerate procedure well. Bilateral Breath Sounds: Clear;Diminished Suctioning: Airway Yes  Leonides Schanz 06/10/2012, 11:56 AM

## 2012-06-10 NOTE — Progress Notes (Signed)
Subjective: Sleeping on the Vent.  Objective: Vital signs in last 24 hours: Temp:  [95.7 F (35.4 C)-98 F (36.7 C)] 98 F (36.7 C) (12/06 0400) Pulse Rate:  [86-101] 92  (12/06 0730) Resp:  [14-20] 14  (12/06 0730) BP: (88-126)/(45-80) 95/56 mmHg (12/06 0730) SpO2:  [98 %-100 %] 99 % (12/06 0730) FiO2 (%):  [30 %] 30 % (12/06 0730) Afebrile, Temp up to 98 @ 3 am. Intermittent hypotension, labs show ongoing elevated WBC, K+ 3.4    Intake/Output from previous day: 12/05 0701 - 12/06 0700 In: 2312.3 [I.V.:1367.3; NG/GT:575; IV Piggyback:150] Out: 865 [Urine:865] Intake/Output this shift: Total I/O In: -  Out: 140 [Urine:140]  Incision/Wound: pending  Lab Results:   Basename 06/10/12 0520 06/09/12 0338  WBC 26.7* 24.6*  HGB 10.8* 11.4*  HCT 32.8* 33.3*  PLT 25* 17*    BMET  Basename 06/10/12 0520 06/09/12 0338  NA 141 142  K 3.4* 3.7  CL 110 110  CO2 28 27  GLUCOSE 95 68*  BUN 10 13  CREATININE 0.46* 0.50  CALCIUM 8.0* 7.5*   PT/INR  Basename 06/10/12 0520 06/08/12 0432  LABPROT 17.3* 19.7*  INR 1.46 1.73*     Lab 06/09/12 0338 06/08/12 0432 06/06/12 2240 06/06/12 2105  AST 80* 102* 99* 110*  ALT 61* 69* 69* 78*  ALKPHOS 148* 159* 176* 199*  BILITOT 1.3* 1.3* 0.7 0.8  PROT 4.1* 4.5* 3.8* 4.2*  ALBUMIN 1.5* 1.7* 1.5* 1.7*     Lipase  No results found for this basename: lipase     Studies/Results: Dg Abd 1 View  06/08/2012  *RADIOLOGY REPORT*  Clinical Data: Abdominal distension and  ABDOMEN - 1 VIEW  Comparison: 06/06/2012  Findings: There is a single loop of prominent small bowel in the left mid abdomen.  The caliber of the remaining bowel including the colon is normal with moderate stool in the colon.  The nasogastric extends into the stomach with the tip at the junction of the body and neck the fundus.  IMPRESSION: The colon  no longer appears distended.  There is a single dilated loop of small bowel in the the left mid abdomen.  A  nasogastric tube is in place with the tip in the stomach.   Original Report Authenticated By: Sander Radon, M.D.    US Abdomen Complete  06/09/2012  *RADIOLOGY REPORT*  Clinical Data:  Abdominal distension.  COMPLETE ABDOMINAL ULTRASOUND  Comparison:  None.  Findings:  Gallbladder:  No stones.  Biliary sludge.  No wall thickening.  No tenderness.  Common bile duct:  4.7 mm.  Liver:  Increased echotexture.  No focal abnormalities.  IVC:  Appears normal.  Pancreas:  Not visualized.  Spleen:  Normal.  5.8 cm.  Right Kidney:  No hydronephrosis.  9.3 cm length.  Left Kidney:  Echogenic.  No hydronephrosis.  10.2 cm.  Abdominal aorta:  No aneurysm.  Diameter 1.8 cm.  Bilateral pleural effusions.  Moderate volume of ascites.  IMPRESSION: Bilateral pleural effusions and moderate ascites. Fatty infiltration of the liver.  Biliary sludge without stones or acute changes.   Original Report Authenticated By: Sander Radon, M.D.    Dg Chest Port 1 View  06/09/2012  *RADIOLOGY REPORT*  Clinical Data: Evaluate endotracheal tube  PORTABLE CHEST - 1 VIEW  Comparison: 06/08/2012; 06/07/2012; 06/06/2012  Findings:  Grossly unchanged cardiac silhouette and mediastinal contours. Stable position of support apparatus.  No definite pneumothorax. Overall improved aeration of the bilateral lung bases favored to  represent interval layering of small bilateral pleural effusions. Scattered bilateral heterogeneous opacities are otherwise grossly unchanged.  No new focal airspace opacities.  Unchanged bones.  IMPRESSION: 1.  Stable positioning of support apparatus.  No pneumothorax. 2.  Interval layering of persistent small bilateral effusions. 3.  Grossly unchanged bilateral air space opacities worrisome for multifocal infection.  4.  Likely unchanged mild pulmonary edema.   Original Report Authenticated By: Tacey Ruiz, MD     Medications:    . antiseptic oral rinse  1 application Mouth Rinse QID  . chlorhexidine  15 mL  Mouth/Throat BID  . collagenase   Topical Daily  . collagenase   Topical Daily  . feeding supplement (OSMOLITE 1.2 CAL)  1,000 mL Per Tube Q24H  . free water  100 mL Per Tube Q8H  . [EXPIRED] influenza  inactive virus vaccine  0.5 mL Intramuscular Tomorrow-1000  . levofloxacin (LEVAQUIN) IV  750 mg Intravenous QHS  . multivitamin  5 mL Per Tube Daily  . pantoprazole (PROTONIX) IV  40 mg Intravenous QHS  . [EXPIRED] pneumococcal 23 valent vaccine  0.5 mL Intramuscular Tomorrow-1000  . [DISCONTINUED] collagenase   Topical Daily  . [DISCONTINUED] feeding supplement (OSMOLITE 1.2 CAL)  1,000 mL Per Tube Q24H  . [DISCONTINUED] sodium chloride  1,000 mL Intravenous Once  . [DISCONTINUED] vancomycin  1,000 mg Intravenous QHS      . sodium chloride 20 mL (06/09/12 1510)  . dextrose 5 % and 0.45% NaCl 20 mL (06/09/12 1505)  . fentaNYL infusion INTRAVENOUS 100 mcg/hr (06/09/12 2000)  . milrinone 0.125 mcg/kg/min (06/09/12 2100)  . norepinephrine (LEVOPHED) Adult infusion 13 mcg/min (06/10/12 0301)  . [DISCONTINUED] midazolam (VERSED) infusion      Assessment/Plan 1. Altered mental status with metabolic encephalopathy.  2. Probable community-acquired pneumonia bilateral with acute respiratory failure; ventilator dependent.  3. 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  4. Sepsis with acidosis, 2.3 lactic acid; WBC 29,500. On Pressors.  5. Severe anemia with a hemoglobin 2.8/hct 10.3  6. Thrombocytopenia [place] [address].  7. Acute liver failure INR 2.6  8. Bilateral decubitus, minimal on the left. Large 6 x 7 x 4 cm area of the right ischium with 100% slough.  9. Malnutrition and deconditioning  10. Hypothermia  11.EF 25-30% with diffuse hypokinesis  Plan:  She is not making a great deal of improvement, failed weaning yesterday.  Now on 2 pressors, although on low doses, We will look at the decubitus with [facility] Rx.  LOS: 4 days     [name] 06/10/2012

## 2012-06-11 ENCOUNTER — Inpatient Hospital Stay (HOSPITAL_COMMUNITY): Payer: Managed Care, Other (non HMO)

## 2012-06-11 LAB — CBC
HCT: 32.2 % — ABNORMAL LOW (ref 36.0–46.0)
Hemoglobin: 10.2 g/dL — ABNORMAL LOW (ref 12.0–15.0)
MCH: 26 pg (ref 26.0–34.0)
MCHC: 31.7 g/dL (ref 30.0–36.0)
MCV: 81.9 fL (ref 78.0–100.0)
Platelets: 19 10*3/uL — CL (ref 150–400)
RBC: 3.93 MIL/uL (ref 3.87–5.11)
RDW: 23.4 % — ABNORMAL HIGH (ref 11.5–15.5)
WBC: 23.6 10*3/uL — ABNORMAL HIGH (ref 4.0–10.5)

## 2012-06-11 LAB — BASIC METABOLIC PANEL
BUN: 10 mg/dL (ref 6–23)
CO2: 29 mEq/L (ref 19–32)
Calcium: 8.2 mg/dL — ABNORMAL LOW (ref 8.4–10.5)
Chloride: 106 mEq/L (ref 96–112)
Creatinine, Ser: 0.44 mg/dL — ABNORMAL LOW (ref 0.50–1.10)
GFR calc Af Amer: >90 mL/min (ref >90)
GFR calc non Af Amer: >90 mL/min (ref >90)
Glucose, Bld: 71 mg/dL (ref 70–99)
Potassium: 3.8 mEq/L (ref 3.5–5.1)
Sodium: 140 mEq/L (ref 135–145)

## 2012-06-11 LAB — GLUCOSE, CAPILLARY
Glucose-Capillary: 150 mg/dL — ABNORMAL HIGH (ref 70–99)
Glucose-Capillary: 64 mg/dL — ABNORMAL LOW (ref 70–99)
Glucose-Capillary: 81 mg/dL (ref 70–99)
Glucose-Capillary: 90 mg/dL (ref 70–99)
Glucose-Capillary: 91 mg/dL (ref 70–99)

## 2012-06-11 MED ORDER — POTASSIUM CHLORIDE 10 MEQ/50ML IV SOLN
10.0000 meq | INTRAVENOUS | Status: AC
  Administered 2012-06-11 (×3): 10 meq via INTRAVENOUS
  Filled 2012-06-11: qty 150

## 2012-06-11 MED ORDER — FUROSEMIDE 10 MG/ML IJ SOLN
INTRAMUSCULAR | Status: AC
  Filled 2012-06-11: qty 4

## 2012-06-11 MED ORDER — DEXTROSE 50 % IV SOLN
INTRAVENOUS | Status: AC
  Administered 2012-06-11: 50 mL
  Filled 2012-06-11: qty 50

## 2012-06-11 MED ORDER — VITAMINS A & D EX OINT
TOPICAL_OINTMENT | CUTANEOUS | Status: AC
  Administered 2012-06-11: 1
  Filled 2012-06-11: qty 5

## 2012-06-11 MED ORDER — FUROSEMIDE 10 MG/ML IJ SOLN
20.0000 mg | Freq: Once | INTRAMUSCULAR | Status: AC
  Administered 2012-06-11: 20 mg via INTRAVENOUS

## 2012-06-11 NOTE — Progress Notes (Signed)
Rectal temp of 94.3 was obtained and CCM Md was notified. Bear Hugger applied per order.

## 2012-06-11 NOTE — Progress Notes (Signed)
eLink Physician-Brief Progress Note Patient Name: [name] DOB: [birth year] MRN: [id number]  Date of Service  06/11/2012   HPI/Events of Note     eICU Interventions  warming blanket for hypothemia   Intervention Category Minor Interventions: Routine modifications to care plan (e.g. PRN medications for pain, fever)  Keelie Zemanek,[place]ae[place] 06/11/2012, 12:15 AM

## 2012-06-11 NOTE — Progress Notes (Signed)
PULMONARY  / CRITICAL CARE MEDICINE  Name: [name] MRN: [id number] DOB: 20-Feb-1961    LOS: 5  REFERRING MD :  EDP  CHIEF COMPLAINT:  FTT and AMS  BRIEF PATIENT DESCRIPTION: 51 yo with limited mobility due to neuromuscular disease (?MS) last seen by Neurologist about 2 years ago brought to [facility] ED with 3 d h/o altered mental status, and FTT X 3 mo. In ED: hypotensive, hypoglycemic, hypothermic, bradycardic, Hb 3.0, WBC 30.  SIGNIFICANT EVENTS:  12/04 transfused 4 units PRBCs 12/04 Echo: EF 20-25%, diffuse HK 12/04 KUB: There is a single dilated loop of small bowel in the the left mid abdomen 12/05 Abd [place]: Bilateral pleural effusions and moderate ascites. Fatty infiltration of the liver. Biliary sludge without stones or acute changes  LINES / TUBES:  12/3 ETT >> 12/06 12/3 L [place] CVL >>   CULTURES:   12/3 Urine: enterococcus (pansens) 12/3 Respiratory >> serratia 12/2 Blood >> NEG  ANTIBIOTICS:  Zosyn 12/3 >>> 12/4 Vancomycin 12/3 >>> 12/08 Levaquin 12/3 >>>   INTERVAL HISTORY:  Awake, alert, + F/C, remains profoundly weak. Tolerating extubation  VITAL SIGNS: Temp:  [94.3 F (34.6 C)-100.5 F (38.1 C)] 99.2 F (37.3 C) (12/07 1200) Pulse Rate:  [83-117] 100  (12/07 1200) Resp:  [9-32] 29  (12/07 1200) BP: (81-110)/(45-72) 110/65 mmHg (12/07 1200) SpO2:  [89 %-100 %] 100 % (12/07 1200) FiO2 (%):  [50 %-100 %] 100 % (12/06 1448) HEMODYNAMICS:   VENTILATOR SETTINGS: Vent Mode:  [-]  FiO2 (%):  [50 %-100 %] 100 % INTAKE / OUTPUT: Intake/Output      12/06 0701 - 12/07 0700 12/07 0701 - 12/08 0700   I.V. (mL/kg) 1025.4 (20.4) 180.5 (3.6)   Other 360 80   NG/GT 50    IV Piggyback 250 102   Total Intake(mL/kg) 1685.4 (33.5) 362.5 (7.2)   Urine (mL/kg/hr) 1890 (1.6) 145 (0.5)   Total Output 1890 145   Net -204.6 +217.5          PHYSICAL EXAMINATION: General:  Frail 51 year old female. Acute on chronically ill  Neuro:  RASS 0, + F/C, diffusely profoundly  weak HEENT:  Temporal wasting.  Cardiovascular:  RRR s M Lungs:  No wheezes anteriorly, dependent rales Abdomen:  Firm, + bowel sounds  Musculoskeletal:  weak Skin:  + anasarca, Lower extremity erythremic rash, c/w cellulitis.   LABS: Cbc  Lab 06/11/12 0500 06/10/12 0520 06/09/12 0338  WBC 23.6* -- --  HGB 10.2* 10.8* 11.4*  HCT 32.2* 32.8* 33.3*  PLT 19* 25* 17*    Chemistry   Lab 06/11/12 0500 06/10/12 0520 06/09/12 0338 06/07/12 0125  NA 140 141 142 --  K 3.8 3.4* 3.7 --  CL 106 110 110 --  CO2 29 28 27  --  BUN 10 10 13  --  CREATININE 0.44* 0.46* 0.50 --  CALCIUM 8.2* 8.0* 7.5* --  MG -- -- -- 2.2  PHOS -- -- -- 5.8*  GLUCOSE 71 95 68* --    Liver fxn  Lab 06/09/12 0338 06/08/12 0432 06/06/12 2240  AST 80* 102* 99*  ALT 61* 69* 69*  ALKPHOS 148* 159* 176*  BILITOT 1.3* 1.3* 0.7  PROT 4.1* 4.5* 3.8*  ALBUMIN 1.5* 1.7* 1.5*   coags  Lab 06/10/12 0520 06/08/12 0432 06/06/12 2105  APTT 39* -- 69*  INR 1.46 1.73* 2.60*   Sepsis markers  Lab 06/09/12 0337 06/08/12 0432 06/08/12 0422 06/07/12 1255 06/07/12 0125  LATICACIDVEN -- --  1.9 3.0* 1.9  PROCALCITON 1.88 1.10 -- -- 0.57   Cardiac markers  Lab 06/06/12 2105  CKTOTAL --  CKMB --  TROPONINI <0.30   BNP No results found for this basename: PROBNP:3 in the last 168 hours ABG  Lab 06/09/12 0416 06/08/12 0422 06/07/12 0035  PHART 7.406 7.388 7.367  PCO2ART 42.2 37.6 36.7  PO2ART 122.0* 156.0* 120.0*  HCO3 26.4* 22.9 20.5  TCO2 24.1 21.0 19.7    CBG trend  Lab 06/11/12 1127 06/11/12 0839 06/11/12 0810 06/11/12 0002 06/10/12 1719  GLUCAP 90 150* 64* 91 72    IMAGING: CXR 12/07: NSC  ECG:  DIAGNOSES: Principal Problem:  *Acute respiratory failure Active Problems:  CAP (community acquired pneumonia)  Thrombocytopenia  Neuromuscular disorder: not fully defined.   Shock circulatory, suspect primarily cardiogenic  Dilated cardiomyopathy  Malnutrition compromising bodily function   Anemia  Coagulopathy  Acute liver failure  Acute encephalopathy, resolved  H/O anorexia nervosa  Enterococcal UTI (pansensitive)   ASSESSMENT / PLAN: Cont O2 and ICU monitoring Diuresis ordered - goal 500 to 1000 cc negative over 24 hrs Cont milrinone and wean NE to maintain MAP > 60 mmHg Monitor renal function Begin diet - soft mechanical Recheck LFTs, coags, CXR AM 12/08 Continue Levofloxacin, recheck PCT AM 12/06    Billy Fischer, MD ; [facility] service Mobile [phone].  After 5:30 PM or weekends, call [phone]

## 2012-06-11 NOTE — Progress Notes (Signed)
PT/HYDROTHERAPY NOTE   06/11/12 1600  Subjective Assessment  Subjective pt now off vent, doing well on [place] today  Pressure Ulcer 06/07/12 Unstageable - Full thickness tissue loss in which the base of the ulcer is covered by slough (yellow, tan, gray, green or brown) and/or eschar (tan, brown or black) in the wound bed. Large Round eshcar   Date First Assessed/Time First Assessed: 06/07/12 0300   Location: Buttocks  Location Orientation: Right  Staging: Unstageable - Full thickness tissue loss in which the base of the ulcer is covered by slough (yellow, tan, gray, green or brown) and/or esc  State of Healing Eschar  Site / Wound Assessment Brown;Black;Dusky  % Wound base Red or Granulating 0%  % Wound base Black 10%  % Wound base Other (Comment) 90% (gray/white,loosening seschar)  Peri-wound Assessment Pink;Edema  Wound Length (cm) 6 cm  Wound Width (cm) 6 cm  Wound Depth (cm) 2 cm (per eval)  Tunneling (cm) 2.5  Margins Unattacted edges (unapproximated)  Drainage Amount Moderate  Drainage Description Purulent  Treatment Debridement (Selective);Hydrotherapy (Pulse lavage);Packing (Saline gauze)  Dressing Type Moist to dry (santyl)  Dressing Changed  Hydrotherapy  Pulsed Lavage with Suction (psi) (8-12psi)  Pulsed Lavage with Suction - Normal Saline Used 1000 mL  Pulsed Lavage Tip Tip with splash shield  Pulsed lavage therapy - wound location R sacral  Selective Debridement  Selective Debridement - Location sacral  Selective Debridement - Tools Used Forceps;Scissors  Selective Debridement - Tissue Removed slough  Wound Therapy - Assess/Plan/Recommendations  Wound Therapy - Clinical Statement eschar softening; pt  c/o pain and RN gave additional meds during tx; dtr-in-law stayed during tx session, face mask offerred to her but declined; wound should progress with multi-modal tx; Call Will on Monday prior to treatment  Factors Delaying/Impairing Wound Healing Infection -  systemic/local;Multiple medical problems;Immobility  Hydrotherapy Plan Debridement;Dressing change;Pulsatile lavage with suction  Wound Therapy - Frequency 6X / week  Wound Therapy - Current Recommendations OT;PT  Wound Therapy - Follow Up Recommendations Skilled nursing facility;Other (comment) (?LTACH)  Wound Plan PLS and selective debridement    Wound Therapy Goals - Improve the function of patient's integumentary system by progressing the wound(s) through the phases of wound healing by:  Decrease Necrotic Tissue to 50  Decrease Necrotic Tissue - Progress Progressing toward goal  Increase Granulation Tissue to 50  Increase Granulation Tissue - Progress Progressing toward goal  Improve Drainage Characteristics Min  Improve Drainage Characteristics - Progress Progressing toward goal  Patient/Family will be able to  position pt. in bed to decrease pressure.  Patient/Family Instruction Goal - Progress Progressing toward goal  Time For Goal Achievement 2 weeks  Wound Therapy - Potential for Goals Good

## 2012-06-11 NOTE — Progress Notes (Signed)
Platelets of 17 was called and reported to Dr. Darrick Penna. Please see Note at [phone]

## 2012-06-11 NOTE — Plan of Care (Signed)
Problem: Phase I Progression Outcomes Goal: Optimized method of communication. Outcome: Progressing Weak speech, but pt is communicating needs.  Problem: Phase II Progression Outcomes Goal: Date pt extubated/weaned off vent Outcome: Completed/Met Date Met:  06/11/12 Extubated 06/10/12, yesterday. Goal: Hemodynamically stable Outcome: Progressing Pt remains on both Levophed and Milrinone gtts.  Goal is to keep MAP=60. Goal: Progress activities as ordered Outcome: Progressing Tolerates sitting up in bed/chair position. Goal: Tolerating prescribed nutrition plan Diet advanced to dysphagia, pt asks for something to eat or drink but then changes her mind.  Following CBG's, noted hypoglycemia episode this am,  Pt does does dextrose in her IVF.

## 2012-06-11 NOTE — Progress Notes (Signed)
CRITICAL VALUE ALERT  Critical value received:  Platelets   Date of notification:  06/11/12  Time of notification:  0555  Critical value read back:yes  Nurse who received alert:  Lucia Estelle  MD notified (1st page):  Dr. Darrick Penna  Time of first page:  [phone]  MD notified (2nd page):  Time of second page:  Responding MD:  Dr. Darrick Penna  Time MD responded:  [phone]

## 2012-06-12 DIAGNOSIS — D649 Anemia, unspecified: Secondary | ICD-10-CM

## 2012-06-12 LAB — CBC
HCT: 31.6 % — ABNORMAL LOW (ref 36.0–46.0)
Hemoglobin: 10.1 g/dL — ABNORMAL LOW (ref 12.0–15.0)
MCH: 25.9 pg — ABNORMAL LOW (ref 26.0–34.0)
MCHC: 32 g/dL (ref 30.0–36.0)
MCV: 81 fL (ref 78.0–100.0)
Platelets: 20 10*3/uL — CL (ref 150–400)
RBC: 3.9 MIL/uL (ref 3.87–5.11)
RDW: 24 % — ABNORMAL HIGH (ref 11.5–15.5)
WBC: 15.7 10*3/uL — ABNORMAL HIGH (ref 4.0–10.5)

## 2012-06-12 LAB — BASIC METABOLIC PANEL
BUN: 10 mg/dL (ref 6–23)
CO2: 32 mEq/L (ref 19–32)
Calcium: 7.8 mg/dL — ABNORMAL LOW (ref 8.4–10.5)
Chloride: 102 mEq/L (ref 96–112)
Creatinine, Ser: 0.4 mg/dL — ABNORMAL LOW (ref 0.50–1.10)
GFR calc Af Amer: >90 mL/min (ref >90)
GFR calc non Af Amer: >90 mL/min (ref >90)
Glucose, Bld: 81 mg/dL (ref 70–99)
Potassium: 3 mEq/L — ABNORMAL LOW (ref 3.5–5.1)
Sodium: 139 mEq/L (ref 135–145)

## 2012-06-12 LAB — GLUCOSE, CAPILLARY
Glucose-Capillary: 81 mg/dL (ref 70–99)
Glucose-Capillary: 91 mg/dL (ref 70–99)
Glucose-Capillary: 92 mg/dL (ref 70–99)

## 2012-06-12 LAB — APTT: aPTT: 42 seconds — ABNORMAL HIGH (ref 24–37)

## 2012-06-12 LAB — HEPATIC FUNCTION PANEL
ALT: 47 U/L — ABNORMAL HIGH (ref 0–35)
AST: 58 U/L — ABNORMAL HIGH (ref 0–37)
Albumin: 1.3 g/dL — ABNORMAL LOW (ref 3.5–5.2)
Alkaline Phosphatase: 126 U/L — ABNORMAL HIGH (ref 39–117)
Bilirubin, Direct: 1 mg/dL — ABNORMAL HIGH (ref 0.0–0.3)
Indirect Bilirubin: 0.4 mg/dL (ref 0.3–0.9)
Total Bilirubin: 1.4 mg/dL — ABNORMAL HIGH (ref 0.3–1.2)
Total Protein: 3.9 g/dL — ABNORMAL LOW (ref 6.0–8.3)

## 2012-06-12 LAB — PRO B NATRIURETIC PEPTIDE: Pro B Natriuretic peptide (BNP): 20971 pg/mL — ABNORMAL HIGH (ref 0–125)

## 2012-06-12 LAB — PROTIME-INR
INR: 1.48 (ref 0.00–1.49)
Prothrombin Time: 17.5 seconds — ABNORMAL HIGH (ref 11.6–15.2)

## 2012-06-12 LAB — PROCALCITONIN: Procalcitonin: 0.48 ng/mL

## 2012-06-12 MED ORDER — VITAL AF 1.2 CAL PO LIQD
1000.0000 mL | ORAL | Status: DC
  Administered 2012-06-12: 1000 mL
  Filled 2012-06-12: qty 1000

## 2012-06-12 MED ORDER — ADULT MULTIVITAMIN LIQUID CH
5.0000 mL | Freq: Every day | ORAL | Status: DC
  Administered 2012-06-12 – 2012-06-20 (×8): 5 mL via ORAL
  Filled 2012-06-12 (×9): qty 5

## 2012-06-12 MED ORDER — POTASSIUM CHLORIDE 10 MEQ/50ML IV SOLN
10.0000 meq | INTRAVENOUS | Status: AC
  Administered 2012-06-12 (×4): 10 meq via INTRAVENOUS
  Filled 2012-06-12 (×4): qty 50

## 2012-06-12 MED ORDER — POTASSIUM CHLORIDE 20 MEQ/15ML (10%) PO LIQD
40.0000 meq | Freq: Once | ORAL | Status: AC
  Administered 2012-06-12: 40 meq
  Filled 2012-06-12: qty 30

## 2012-06-12 NOTE — Progress Notes (Signed)
ANTIBIOTIC CONSULT NOTE   Pharmacy Consult for Levofloxacin Indication: pneumonia and rule out sepsis  Allergies  Allergen Reactions  . Scopolamine Anaphylaxis and Shortness Of Breath    Respiratory distress  . Ivp Dye (Iodinated Diagnostic Agents) Other (See Comments)    unknown    Patient Measurements: Height: 5\' 6"  (167.6 cm) Weight: 110 lb 14.3 oz (50.3 kg) IBW/kg (Calculated) : 59.3   Vital Signs: Temp: 97.9 F (36.6 C) (12/08 0800) Temp src: Axillary (12/08 0800) BP: 98/49 mmHg (12/08 0900) Pulse Rate: 97  (12/08 0900)  Labs:  Basename 06/12/12 0635 06/11/12 0500 06/10/12 0520  WBC 15.7* 23.6* 26.7*  HGB 10.1* 10.2* 10.8*  PLT 20* 19* 25*  LABCREA -- -- --  CREATININE 0.40* 0.44* 0.46*   Estimated Creatinine Clearance: 67.5 ml/min (by C-G formula based on Cr of 0.4).   Microbiology: Recent Results (from the past 720 hour(s))  CULTURE, BLOOD (ROUTINE X 2)     Status: Normal (Preliminary result)   Collection Time   06/06/12  9:05 PM      Component Value Range Status Comment   Specimen Description BLOOD ARTERIAL   Final    Special Requests BOTTLES DRAWN AEROBIC AND ANAEROBIC 3CC   Final    Culture  Setup Time 06/07/2012 01:44   Final    Culture     Final    Value:        BLOOD CULTURE RECEIVED NO GROWTH TO DATE CULTURE WILL BE HELD FOR 5 DAYS BEFORE ISSUING A FINAL NEGATIVE REPORT   Report Status PENDING   Incomplete   URINE CULTURE     Status: Normal   Collection Time   06/06/12  9:48 PM      Component Value Range Status Comment   Specimen Description URINE, CATHETERIZED   Final    Special Requests NONE   Final    Culture  Setup Time 06/07/2012 03:45   Final    Colony Count >=100,000 COLONIES/ML   Final    Culture ENTEROCOCCUS SPECIES   Final    Report Status 06/09/2012 FINAL   Final    Organism ID, Bacteria ENTEROCOCCUS SPECIES   Final   MRSA PCR SCREENING     Status: Normal   Collection Time   06/07/12  2:37 AM      Component Value Range Status  Comment   MRSA by PCR NEGATIVE  NEGATIVE Final   CULTURE, RESPIRATORY     Status: Normal   Collection Time   06/07/12  3:55 AM      Component Value Range Status Comment   Specimen Description TRACHEAL ASPIRATE   Final    Special Requests NONE   Final    Gram Stain     Final    Value: RARE WBC PRESENT, PREDOMINANTLY MONONUCLEAR     RARE SQUAMOUS EPITHELIAL CELLS PRESENT     NO ORGANISMS SEEN   Culture RARE SERRATIA MARCESCENS   Final    Report Status 06/10/2012 FINAL   Final    Organism ID, Bacteria SERRATIA MARCESCENS   Final     Medications:  Anti-infectives     Start     Dose/Rate Route Frequency Ordered Stop   06/07/12 2200   vancomycin (VANCOCIN) IVPB 1000 mg/200 mL premix  Status:  Discontinued        1,000 mg 200 mL/hr over 60 Minutes Intravenous Daily at bedtime 06/07/12 0041 06/09/12 1110   06/07/12 0600   piperacillin-tazobactam (ZOSYN) IVPB 3.375 g  Status:  Discontinued        3.375 g 12.5 mL/hr over 240 Minutes Intravenous 3 times per day 06/07/12 0039 06/08/12 1256   06/07/12 0045   levofloxacin (LEVAQUIN) IVPB 750 mg        750 mg 100 mL/hr over 90 Minutes Intravenous Daily at bedtime 06/07/12 0038     06/06/12 2115   piperacillin-tazobactam (ZOSYN) IVPB 3.375 g        3.375 g 12.5 mL/hr over 240 Minutes Intravenous  Once 06/06/12 2102 06/07/12 0128   06/06/12 2115   vancomycin (VANCOCIN) IVPB 1000 mg/200 mL premix        1,000 mg 200 mL/hr over 60 Minutes Intravenous  Once 06/06/12 2102 06/06/12 2226         Assessment:  51 YOF admit with pneumonia, cellulitis, and infected decub.  Started on broad spectrum abx (vanc, zosyn, levaquin) initially and narrowed to levaquin only on 12/5.  Renal function is stable.  Urine cxt: pan-sensitive enterococcus  Respiratory cxt: rare serratia marcescens (sens: cefepime, ceftaz, cftx, cipro, gent, tmp/sulf)  PCT decreased to 0.48, WBC decreased.  Goal of Therapy:  Appropriate abx dosing, eradication of  infection.   Plan:  Continue Levaquin 750mg  IV q24h. Follow up renal fxn and culture results.   Lynann Beaver PharmD, BCPS Pager [phone] 06/12/2012 10:16 AM

## 2012-06-12 NOTE — Progress Notes (Signed)
PULMONARY  / CRITICAL CARE MEDICINE  Name: [name] MRN: [id number] DOB: [birth year]    LOS: 6  REFERRING MD :  EDP  CHIEF COMPLAINT:  FTT and AMS  BRIEF PATIENT DESCRIPTION: 51 yo with limited mobility due to poorly neuromuscular disease, last seen by Neurologist about 2 years ago, brought to [facility] ED with 3 d h/o altered mental status, and FTT X 3 mo. In ED: hypotensive, hypoglycemic, hypothermic, bradycardic, Hb 3.0, WBC 30. Intubated for AMS. Admitting dx of severe sepsis of unclear source   SIGNIFICANT EVENTS:  12/04 transfused 4 units PRBCs 12/04 Echo: EF 20-25%, diffuse HK 12/04 KUB: There is a single dilated loop of small bowel in the the left mid abdomen 12/05 Abd [place]: Bilateral pleural effusions and moderate ascites. Fatty infiltration of the liver. Biliary sludge without stones or acute changes   LINES / TUBES:  12/3 ETT >> 12/06 12/3 L [place] CVL >>    CULTURES:   12/3 Urine: enterococcus (pansens) 12/3 Respiratory >> serratia 12/2 Blood >> NEG   ANTIBIOTICS:  Zosyn 12/3 >>> 12/4 Vancomycin 12/3 >>> 12/08 Levaquin 12/3 >>>    INTERVAL HISTORY:  Awake, alert, + F/C, remains profoundly weak. Tolerating extubation. Not meeting nutritional needs  VITAL SIGNS: Temp:  [97.6 F (36.4 C)-99.2 F (37.3 C)] 97.9 F (36.6 C) (12/08 0800) Pulse Rate:  [86-106] 97  (12/08 0900) Resp:  [22-32] 28  (12/08 0900) BP: (91-115)/(45-68) 98/49 mmHg (12/08 0900) SpO2:  [89 %-100 %] 91 % (12/08 0900) HEMODYNAMICS:   VENTILATOR SETTINGS:   INTAKE / OUTPUT: Intake/Output      12/07 0701 - 12/08 0700 12/08 0701 - 12/09 0700   I.V. (mL/kg) 959.8 (19.1) 67.4 (1.3)   Other 80    NG/GT     IV Piggyback 152    Total Intake(mL/kg) 1191.8 (23.7) 67.4 (1.3)   Urine (mL/kg/hr) 2685 (2.2) 200   Total Output 2685 200   Net -1493.2 -132.6          PHYSICAL EXAMINATION: General:  Frail 51 year old female.  Chronically ill  Neuro:  RASS 0, + F/C, diffusely profoundly  weak HEENT:  Temporal wasting.  Cardiovascular:  RRR s M Lungs:  No wheezes anteriorly, dependent rales Abdomen:  Firm, + bowel sounds  Musculoskeletal:  weak Skin:  Anasarca much improved. 1-2+ LE edema   LABS: Cbc  Lab 06/12/12 0635 06/11/12 0500 06/10/12 0520  WBC 15.7* -- --  HGB 10.1* 10.2* 10.8*  HCT 31.6* 32.2* 32.8*  PLT 20* 19* 25*    Chemistry   Lab 06/12/12 0635 06/11/12 0500 06/10/12 0520 06/07/12 0125  NA 139 140 141 --  K 3.0* 3.8 3.4* --  CL 102 106 110 --  CO2 32 29 28 --  BUN 10 10 10  --  CREATININE 0.40* 0.44* 0.46* --  CALCIUM 7.8* 8.2* 8.0* --  MG -- -- -- 2.2  PHOS -- -- -- 5.8*  GLUCOSE 81 71 95 --    Liver fxn  Lab 06/12/12 0635 06/09/12 0338 06/08/12 0432  AST 58* 80* 102*  ALT 47* 61* 69*  ALKPHOS 126* 148* 159*  BILITOT 1.4* 1.3* 1.3*  PROT 3.9* 4.1* 4.5*  ALBUMIN 1.3* 1.5* 1.7*   coags  Lab 06/12/12 0635 06/10/12 0520 06/08/12 0432 06/06/12 2105  APTT 42* 39* -- 69*  INR 1.48 1.46 1.73* --   Sepsis markers  Lab 06/12/12 1914 06/09/12 0337 06/08/12 0432 06/08/12 0422 06/07/12 1255 06/07/12 0125  LATICACIDVEN -- -- --  1.9 3.0* 1.9  PROCALCITON 0.48 1.88 1.10 -- -- --   Cardiac markers  Lab 06/06/12 2105  CKTOTAL --  CKMB --  TROPONINI <0.30   BNP  Lab 06/12/12 0635  PROBNP 20971.0*   ABG  Lab 06/09/12 0416 06/08/12 0422 06/07/12 0035  PHART 7.406 7.388 7.367  PCO2ART 42.2 37.6 36.7  PO2ART 122.0* 156.0* 120.0*  HCO3 26.4* 22.9 20.5  TCO2 24.1 21.0 19.7    CBG trend  Lab 06/12/12 0824 06/12/12 0052 06/11/12 1622 06/11/12 1127 06/11/12 0839  GLUCAP 81 91 81 90 150*   PCT: 0.48  IMAGING: No new CXR 12/08  ECG:    IMPRESSION:  PMH of :  Neuromuscular disorder, apparently progressive - not fully defined despite extensive Neurology eval in past.   MS has been entertained in past and r/o'd  Profound debilitation, bed bound X months  H/O anorexia nervosa  Severe,prolonged malnutrition compromising  bodily function  FMS, PTSD, IBS  Presentation 12/03 of:  Acute respiratory failure, now resolving, extubated 12/06  Acute encephalopathy, resolved  Severe anemia of unclear etiology, no evidence of acute blood loss  Severe thrombocytopenia of unclear etiology  Coagulopathy  Suspected Severe sepsis/septic shock  Suspected CAP  Severe sacral decubitus ulcer   Current active problems:  Acute respiratory failure, now resolving  extubated 12/06 and tolerating   Shock circulatory, suspect cardiogenic > septic, resolving  Remains on low dose NE to maintain MAP > 60 mmHg. No clinical evidence of shock   Dilated cardiomyopathy, unclear etiology   Suspected CAP, serratia on resp cx  completing course of Levofloxacin   Severe sacral decubitus pressure ulcer  WOC and CCS following   Enterococcal UTI (pansensitive)   Thrombocytopenia of unclear etiology   No evidence of actvie bleeding  No indication for plts tx   Coagulopathy, mild   No evidence of actvie bleeding  No indication for blood products   Abnormal LFTs - improving   Anasarca, resolving  DISCUSSION: Suspect that much or most of her illness is attributable to profound malnutrition which is likely the most reversible component of her overall illness. Thrombocytopenia is hard to explain on this basis alone. Dilated CM can be a result of severe prolonged malnutrition. This appears much better with milrinone and anasarca is resolving with diuresis. She is unable to take much nutrition by mouth due to weakness and anorexia. I believe that we should focus on treating the problems already defined focusing primarily on improving her nutritional status and monitor the less-well defined problems for resolution. She might even benefit from a PEG for long term nutritional support. She would likely benefit from SNF after this hospitalization   PLAN:  Change to SDU status Hold further diuresis - see what she does on her own Cont  milrinone infusion Wean NE to off for MAP > 55 mmHg Place NGT for TFs Cont soft mechanical Cont current abx for now - complete 10 days Cont wound care Monitor CBC, BMET, LFTs intermittently Recheck TSH, T4 AM 12/09 Recheck CXR 12/09     Billy Fischer, MD ; [facility] service Mobile [phone].  After 5:30 PM or weekends, call [phone]

## 2012-06-12 NOTE — Progress Notes (Signed)
Subjective: Patient extubated.  In room with husband.  Patient is holding husband's hand and per his report has not let him go.  Has been less than cooperative with the staff.  Otherwise very withdrawn.  Prior to today was speaking and more interactive.    Objective: Current vital signs: BP 94/54  Pulse 95  Temp 97.9 F (36.6 C) (Axillary)  Resp 27  Ht 5\' 6"  (1.676 m)  Wt 50.3 kg (110 lb 14.3 oz)  BMI 17.90 kg/m2  SpO2 98% Vital signs in last 24 hours: Temp:  [97.6 F (36.4 C)-97.9 F (36.6 C)] 97.9 F (36.6 C) (12/08 1200) Pulse Rate:  [86-102] 95  (12/08 1400) Resp:  [22-31] 27  (12/08 1400) BP: (91-115)/(45-68) 94/54 mmHg (12/08 1400) SpO2:  [91 %-100 %] 98 % (12/08 1200)  Intake/Output from previous day: 12/07 0701 - 12/08 0700 In: 1191.8 [I.V.:959.8; IV Piggyback:152] Out: 2685 [Urine:2685] Intake/Output this shift: Total I/O In: 537.8 [I.V.:237.8; IV Piggyback:300] Out: 500 [Urine:500] Nutritional status: Dysphagia  Neurologic Exam: Refuses examination  Lab Results: Basic Metabolic Panel:  Lab 06/12/12 3086 06/11/12 0500 06/10/12 0520 06/09/12 0338 06/08/12 0432 06/07/12 0125  NA 139 140 141 142 142 --  K 3.0* 3.8 3.4* 3.7 3.3* --  CL 102 106 110 110 109 --  CO2 32 29 28 27 23  --  GLUCOSE 81 71 95 68* 102* --  BUN 10 10 10 13 20  --  CREATININE 0.40* 0.44* 0.46* 0.50 0.60 --  CALCIUM 7.8* 8.2* 8.0* -- -- --  MG -- -- -- -- -- 2.2  PHOS -- -- -- -- -- 5.8*    Liver Function Tests:  Lab 06/12/12 0635 06/09/12 0338 06/08/12 0432 06/06/12 2240 06/06/12 2105  AST 58* 80* 102* 99* 110*  ALT 47* 61* 69* 69* 78*  ALKPHOS 126* 148* 159* 176* 199*  BILITOT 1.4* 1.3* 1.3* 0.7 0.8  PROT 3.9* 4.1* 4.5* 3.8* 4.2*  ALBUMIN 1.3* 1.5* 1.7* 1.5* 1.7*   No results found for this basename: LIPASE:5,AMYLASE:5 in the last 168 hours No results found for this basename: AMMONIA:3 in the last 168 hours  CBC:  Lab 06/12/12 0635 06/11/12 0500 06/10/12 0520 06/09/12 0338  06/08/12 0432 06/06/12 2240 06/06/12 2105  WBC 15.7* 23.6* 26.7* 24.6* 20.1* -- --  NEUTROABS -- -- -- -- -- 28.9* 31.7*  HGB 10.1* 10.2* 10.8* 11.4* 11.5* -- --  HCT 31.6* 32.2* 32.8* 33.3* 34.2* -- --  MCV 81.0 81.9 79.6 78.4 78.3 -- --  PLT 20* 19* 25* 17* 23* -- --    Cardiac Enzymes:  Lab 06/06/12 2105  CKTOTAL --  CKMB --  CKMBINDEX --  TROPONINI <0.30    Lipid Panel: No results found for this basename: CHOL:5,TRIG:5,HDL:5,CHOLHDL:5,VLDL:5,LDLCALC:5 in the last 168 hours  CBG:  Lab 06/12/12 0824 06/12/12 0052 06/11/12 1622 06/11/12 1127 06/11/12 0839  GLUCAP 81 91 81 90 150*    Microbiology: Results for orders placed during the hospital encounter of 06/06/12  CULTURE, BLOOD (ROUTINE X 2)     Status: Normal (Preliminary result)   Collection Time   06/06/12  9:05 PM      Component Value Range Status Comment   Specimen Description BLOOD ARTERIAL   Final    Special Requests BOTTLES DRAWN AEROBIC AND ANAEROBIC 3CC   Final    Culture  Setup Time 06/07/2012 01:44   Final    Culture     Final    Value:        BLOOD  CULTURE RECEIVED NO GROWTH TO DATE CULTURE WILL BE HELD FOR 5 DAYS BEFORE ISSUING A FINAL NEGATIVE REPORT   Report Status PENDING   Incomplete   URINE CULTURE     Status: Normal   Collection Time   06/06/12  9:48 PM      Component Value Range Status Comment   Specimen Description URINE, CATHETERIZED   Final    Special Requests NONE   Final    Culture  Setup Time 06/07/2012 03:45   Final    Colony Count >=100,000 COLONIES/ML   Final    Culture ENTEROCOCCUS SPECIES   Final    Report Status 06/09/2012 FINAL   Final    Organism ID, Bacteria ENTEROCOCCUS SPECIES   Final   MRSA PCR SCREENING     Status: Normal   Collection Time   06/07/12  2:37 AM      Component Value Range Status Comment   MRSA by PCR NEGATIVE  NEGATIVE Final   CULTURE, RESPIRATORY     Status: Normal   Collection Time   06/07/12  3:55 AM      Component Value Range Status Comment   Specimen  Description TRACHEAL ASPIRATE   Final    Special Requests NONE   Final    Gram Stain     Final    Value: RARE WBC PRESENT, PREDOMINANTLY MONONUCLEAR     RARE SQUAMOUS EPITHELIAL CELLS PRESENT     NO ORGANISMS SEEN   Culture RARE SERRATIA MARCESCENS   Final    Report Status 2012-06-11 FINAL   Final    Organism ID, Bacteria SERRATIA MARCESCENS   Final     Coagulation Studies:  Basename 06/12/12 0635 Jun 11, 2012 0520  LABPROT 17.5* 17.3*  INR 1.48 1.46    Imaging: Dg Chest Port 1 View  06/11/2012  *RADIOLOGY REPORT*  Clinical Data: Respiratory failure.  PORTABLE CHEST - 1 VIEW  Comparison: 06/11/12  Findings: Left central line remains in place, unchanged.  The tip is in the upper right atrium.  Interval extubation.  Bilateral airspace opacities and effusions, likely not significantly changed since prior study.  Heart is normal size.  IMPRESSION: Interval extubation.  Continued bilateral airspace disease and effusions.   Original Report Authenticated By: Charlett Nose, M.D.     Medications:  Scheduled:   . antiseptic oral rinse  1 application Mouth Rinse QID  . chlorhexidine  15 mL Mouth/Throat BID  . collagenase   Topical Daily  . collagenase   Topical Daily  . [EXPIRED] furosemide      . levofloxacin (LEVAQUIN) IV  750 mg Intravenous QHS  . multivitamin  5 mL Oral Daily  . [COMPLETED] potassium chloride  10 mEq Intravenous Q1 Hr x 3  . potassium chloride  10 mEq Intravenous Q1 Hr x 4  . [COMPLETED] potassium chloride  40 mEq Per Tube Once  . [COMPLETED] vitamin A & D      . [DISCONTINUED] pantoprazole (PROTONIX) IV  40 mg Intravenous QHS    Assessment/Plan: Unable to evaluate patient today.  Became very tachypneic when even discussing it.  Will attempt again tomorrow.       LOS: 6 days   Thana Farr, MD [facility] [phone] 06/12/2012  2:37 PM

## 2012-06-13 ENCOUNTER — Inpatient Hospital Stay (HOSPITAL_COMMUNITY): Payer: Managed Care, Other (non HMO)

## 2012-06-13 DIAGNOSIS — R579 Shock, unspecified: Secondary | ICD-10-CM

## 2012-06-13 DIAGNOSIS — D689 Coagulation defect, unspecified: Secondary | ICD-10-CM

## 2012-06-13 DIAGNOSIS — J690 Pneumonitis due to inhalation of food and vomit: Secondary | ICD-10-CM

## 2012-06-13 LAB — GLUCOSE, CAPILLARY
Glucose-Capillary: 83 mg/dL (ref 70–99)
Glucose-Capillary: 93 mg/dL (ref 70–99)
Glucose-Capillary: 94 mg/dL (ref 70–99)

## 2012-06-13 LAB — CULTURE, BLOOD (ROUTINE X 2): Culture: NO GROWTH

## 2012-06-13 LAB — BASIC METABOLIC PANEL
BUN: 15 mg/dL (ref 6–23)
CO2: 29 mEq/L (ref 19–32)
Calcium: 7.9 mg/dL — ABNORMAL LOW (ref 8.4–10.5)
Chloride: 102 mEq/L (ref 96–112)
Creatinine, Ser: 0.39 mg/dL — ABNORMAL LOW (ref 0.50–1.10)
GFR calc Af Amer: >90 mL/min (ref >90)
GFR calc non Af Amer: >90 mL/min (ref >90)
Glucose, Bld: 93 mg/dL (ref 70–99)
Potassium: 4 mEq/L (ref 3.5–5.1)
Sodium: 138 mEq/L (ref 135–145)

## 2012-06-13 LAB — TSH: TSH: 3.997 u[IU]/mL (ref 0.350–4.500)

## 2012-06-13 LAB — BLOOD GAS, ARTERIAL
Acid-Base Excess: 3.5 mmol/L — ABNORMAL HIGH (ref 0.0–2.0)
Acid-Base Excess: 3.7 mmol/L — ABNORMAL HIGH (ref 0.0–2.0)
Bicarbonate: 28.5 mEq/L — ABNORMAL HIGH (ref 20.0–24.0)
Bicarbonate: 28.3 mEq/L — ABNORMAL HIGH (ref 20.0–24.0)
Drawn by: 308601
Drawn by: 295031
FIO2: 1 %
FIO2: 1 %
MECHVT: 450 mL
O2 Saturation: 73.1 %
O2 Saturation: 99.5 %
PEEP: 5 cmH2O
Patient temperature: 98.6
Patient temperature: 98.6
RATE: 14 resp/min
TCO2: 26.6 mmol/L (ref 0–100)
TCO2: 26.3 mmol/L (ref 0–100)
pCO2 arterial: 48 mmHg — ABNORMAL HIGH (ref 35.0–45.0)
pCO2 arterial: 45.3 mmHg — ABNORMAL HIGH (ref 35.0–45.0)
pH, Arterial: 7.391 (ref 7.350–7.450)
pH, Arterial: 7.412 (ref 7.350–7.450)
pO2, Arterial: 41.1 mmHg — ABNORMAL LOW (ref 80.0–100.0)
pO2, Arterial: 181 mmHg — ABNORMAL HIGH (ref 80.0–100.0)

## 2012-06-13 LAB — GLUTAMIC ACID DECARBOXYLASE AUTO ABS: Glutamic Acid Decarb Ab: <1 U/mL (ref <=1.0)

## 2012-06-13 LAB — CBC
HCT: 30 % — ABNORMAL LOW (ref 36.0–46.0)
Hemoglobin: 9.8 g/dL — ABNORMAL LOW (ref 12.0–15.0)
MCH: 26.2 pg (ref 26.0–34.0)
MCHC: 32.7 g/dL (ref 30.0–36.0)
MCV: 80.2 fL (ref 78.0–100.0)
Platelets: 23 10*3/uL — CL (ref 150–400)
RBC: 3.74 MIL/uL — ABNORMAL LOW (ref 3.87–5.11)
RDW: 24.1 % — ABNORMAL HIGH (ref 11.5–15.5)
WBC: 13.1 10*3/uL — ABNORMAL HIGH (ref 4.0–10.5)

## 2012-06-13 LAB — T4, FREE: Free T4: 1.34 ng/dL (ref 0.80–1.80)

## 2012-06-13 LAB — FOLATE RBC: RBC Folate: 1101 ng/mL — ABNORMAL HIGH (ref >=366)

## 2012-06-13 MED ORDER — MIDAZOLAM HCL 5 MG/ML IJ SOLN
INTRAMUSCULAR | Status: AC
  Administered 2012-06-13: 3 mg
  Filled 2012-06-13: qty 1

## 2012-06-13 MED ORDER — SODIUM CHLORIDE 0.9 % IV SOLN
INTRAVENOUS | Status: DC
  Administered 2012-06-13 – 2012-06-14 (×2): via INTRAVENOUS

## 2012-06-13 MED ORDER — SILVER SULFADIAZINE 1 % EX CREA
TOPICAL_CREAM | Freq: Every day | CUTANEOUS | Status: DC
  Administered 2012-06-13 – 2012-06-15 (×2): 1 via TOPICAL
  Administered 2012-06-16 – 2012-06-20 (×4): via TOPICAL
  Filled 2012-06-13: qty 85

## 2012-06-13 MED ORDER — ETOMIDATE 2 MG/ML IV SOLN
INTRAVENOUS | Status: AC
  Administered 2012-06-13: 20 mg
  Filled 2012-06-13: qty 20

## 2012-06-13 MED ORDER — ETOMIDATE 2 MG/ML IV SOLN
16.0000 mg | Freq: Once | INTRAVENOUS | Status: AC
  Administered 2012-06-13: 16 mg via INTRAVENOUS

## 2012-06-13 MED ORDER — PANTOPRAZOLE SODIUM 40 MG PO PACK
40.0000 mg | PACK | Freq: Every day | ORAL | Status: DC
  Administered 2012-06-13 – 2012-06-20 (×8): 40 mg
  Filled 2012-06-13 (×8): qty 20

## 2012-06-13 MED ORDER — MIDAZOLAM HCL 2 MG/2ML IJ SOLN
2.0000 mg | INTRAMUSCULAR | Status: DC | PRN
  Administered 2012-06-13: 2 mg via INTRAVENOUS
  Administered 2012-06-13: 3 mg via INTRAVENOUS
  Administered 2012-06-17: 2 mg via INTRAVENOUS

## 2012-06-13 MED ORDER — FUROSEMIDE 10 MG/ML IJ SOLN: 20.0000 mg | Freq: Four times a day (QID) | INTRAMUSCULAR | Status: DC

## 2012-06-13 MED ORDER — ROCURONIUM BROMIDE 50 MG/5ML IV SOLN
INTRAVENOUS | Status: AC
  Filled 2012-06-13: qty 2

## 2012-06-13 MED ORDER — OSMOLITE 1.2 CAL PO LIQD
1000.0000 mL | ORAL | Status: DC
  Administered 2012-06-14 – 2012-06-18 (×4): 1000 mL

## 2012-06-13 MED ORDER — SODIUM CHLORIDE 0.9 % IV BOLUS (SEPSIS)
250.0000 mL | Freq: Once | INTRAVENOUS | Status: AC
  Administered 2012-06-13: 250 mL via INTRAVENOUS

## 2012-06-13 MED ORDER — SUCCINYLCHOLINE CHLORIDE 20 MG/ML IJ SOLN
INTRAMUSCULAR | Status: AC
  Filled 2012-06-13: qty 1

## 2012-06-13 MED ORDER — MIDAZOLAM HCL 5 MG/ML IJ SOLN
INTRAMUSCULAR | Status: AC
  Administered 2012-06-13: 2 mg
  Filled 2012-06-13: qty 1

## 2012-06-13 MED ORDER — LIDOCAINE HCL (CARDIAC) 20 MG/ML IV SOLN
INTRAVENOUS | Status: AC
  Filled 2012-06-13: qty 5

## 2012-06-13 MED ORDER — ETOMIDATE 2 MG/ML IV SOLN
5.0000 mg | Freq: Once | INTRAVENOUS | Status: AC
  Administered 2012-06-13: 5 mg via INTRAVENOUS

## 2012-06-13 MED ORDER — FENTANYL CITRATE 0.05 MG/ML IJ SOLN
50.0000 ug | INTRAMUSCULAR | Status: DC | PRN
  Administered 2012-06-13 – 2012-06-16 (×13): 50 ug via INTRAVENOUS
  Administered 2012-06-16 – 2012-06-18 (×3): 100 ug via INTRAVENOUS
  Administered 2012-06-18 – 2012-06-20 (×6): 50 ug via INTRAVENOUS
  Filled 2012-06-13 (×18): qty 2

## 2012-06-13 MED ORDER — SODIUM CHLORIDE 0.9 % IV BOLUS (SEPSIS)
1000.0000 mL | Freq: Once | INTRAVENOUS | Status: AC
  Administered 2012-06-14: 1000 mL via INTRAVENOUS

## 2012-06-13 NOTE — Procedures (Addendum)
Intubation Procedure Note [name] [id number] Mar 20, 1961  Procedure: Intubation Indications: Respiratory insufficiency  Procedure Details Consent: Risks of procedure as well as the alternatives and risks of each were explained to the (patient/caregiver).  Consent for procedure obtained. Time Out: Verified patient identification, verified procedure, site/side was marked, verified correct patient position, special equipment/implants available, medications/allergies/relevent history reviewed, required imaging and test results available.  Performed  Maximum sterile technique was used including antiseptics, gloves, hand hygiene and mask.  MAC    Evaluation Hemodynamic Status: BP stable throughout; O2 sats: stable throughout Patient's Current Condition: stable Complications: No apparent complications Patient did tolerate procedure well. Chest X-ray ordered to verify placement.  CXR: pending.  Three loose teeth were removed after falling out of the patient's mouth during intubation.  Was an extremely difficult airway.  [name],[name] 06/13/2012

## 2012-06-13 NOTE — Progress Notes (Signed)
eLink Physician-Brief Progress Note Patient Name: [name] DOB: [birth year] MRN: [id number]  Date of Service  06/13/2012   HPI/Events of Note  RN calls sayiing Ur op 37 cc over 2h. CVP 6. Earlier today lasix held   eICU Interventions  1 L fluid bolus and reassess   Intervention Category Intermediate Interventions: Medication change / dose adjustment Minor Interventions: Routine modifications to care plan (e.g. PRN medications for pain, fever)  Jason Frisbee 06/13/2012, 11:49 PM

## 2012-06-13 NOTE — Progress Notes (Signed)
PHYSICAL THERAPY PLS TX  06/13/12  12:18 - 13:15  Subjective Assessment  Subjective Pt on vent and sedated. Surgical MD and PA came in during session.  Increased time to reposition after Tx.  Pressure Ulcer 06/07/12 Unstageable - Full thickness tissue loss in which the base of the ulcer is covered by slough (yellow, tan, gray, green or brown) and/or eschar (tan, brown or black) in the wound bed. Large Round eshcar   Date First Assessed/Time First Assessed: 06/07/12 0300   Location: Buttocks  Location Orientation: Right  Staging: Unstageable - Full thickness tissue loss in which the base of the ulcer is covered by slough (yellow, tan, gray, green or brown) and/or esc  State of Healing Eschar  Site / Wound Assessment Brown;Yellow;Bleeding;Granulation tissue  % Wound base Red or Granulating 15%  % Wound base Yellow 75%  % Wound base Black 10%  Peri-wound Assessment Pink  Wound Length (cm) 5 cm  Wound Width (cm) 5 cm  Wound Depth (cm) 4 cm  Tunneling (cm) 6  Undermining (cm) at 8 thru 10 oclock  Margins Unattacted edges (unapproximated)  Drainage Amount Moderate  Drainage Description Purulent  Dressing Type Moist to dry (santyl)  Dressing Changed  Wound 06/07/12 Fissure;Skin tear Sacrum small black area and pink tissue  Date First Assessed/Time First Assessed: 06/07/12 0300   Wound Type: Fissure;Skin tear  Location: Sacrum  Wound Description (Comments): small black area and pink tissue  Dressing Type Foam  Dressing Status Dry;Intact  Hydrotherapy  Pulsed Lavage with Suction (psi) (8-12psi)  Pulsed Lavage with Suction - Normal Saline Used 1000 mL  Pulsed Lavage Tip Tip with splash shield  Pulsed lavage therapy - wound location R sacral  Selective Debridement  Selective Debridement - Location sacral  Selective Debridement - Tools Used Forceps;Scissors  Selective Debridement - Tissue Removed slough  Wound Therapy - Assess/Plan/Recommendations  Wound Therapy - Clinical Statement  Surgical MD and PA came in during session to access and asked for a measurement. Pt intubated just an hour earlier.  RN in room assisting with tubes/lines/leads as we positioned for TX. Repositioned after tx.  Factors Delaying/Impairing Wound Healing Infection - systemic/local;Multiple medical problems;Immobility  Hydrotherapy Plan Debridement;Dressing change;Pulsatile lavage with suction  Wound Therapy - Frequency 6X / week  Wound Therapy - Current Recommendations OT;PT  Wound Therapy - Follow Up Recommendations Skilled nursing facility;Other (comment) (?LTACH)  Wound Plan PLS and selective debridement    Wound Therapy Goals - Improve the function of patient's integumentary system by progressing the wound(s) through the phases of wound healing by:  Decrease Necrotic Tissue to 50  Increase Granulation Tissue to 50  Improve Drainage Characteristics Min  Patient/Family will be able to  position pt. in bed to decrease pressure.  Time For Goal Achievement 2 weeks  Wound Therapy - Potential for Goals Good   Felecia Shelling  PTA [facility]  Acute  Rehab Pager     [phone]

## 2012-06-13 NOTE — Progress Notes (Signed)
eLink Physician-Brief Progress Note Patient Name: EULONDA ANDALON DOB: 1960/12/01 MRN: [id number]  Date of Service  06/13/2012   HPI/Events of Note   cvp 2  eICU Interventions  Hold lasix while on pressors   Intervention Category Intermediate Interventions: Medication change / dose adjustment  Joshaua Epple V. 06/13/2012, 3:54 PM

## 2012-06-13 NOTE — Progress Notes (Addendum)
PULMONARY  / CRITICAL CARE MEDICINE  Name: AIRAM RUNIONS MRN: [id number] DOB: Dec 03, 1960    LOS: 7  REFERRING MD :  EDP  CHIEF COMPLAINT:  FTT and AMS  BRIEF PATIENT DESCRIPTION: 51 yo with limited mobility due to poorly neuromuscular disease, last seen by Neurologist about 2 years ago, brought to [facility] ED with 3 d h/o altered mental status, and FTT X 3 mo. In ED: hypotensive, hypoglycemic, hypothermic, bradycardic, Hb 3.0, WBC 30. Intubated for AMS. Admitting dx of severe sepsis of unclear source  SIGNIFICANT EVENTS:  12/04 transfused 4 units PRBCs 12/04 Echo: EF 20-25%, diffuse HK 12/04 KUB: There is a single dilated loop of small bowel in the the left mid abdomen 12/05 Abd [place]: Bilateral pleural effusions and moderate ascites. Fatty infiltration of the liver. Biliary sludge without stones or acute changes  LINES / TUBES:  12/3 ETT >> 12/06>>>12/9>>> 12/3 L [place] CVL >>    CULTURES:   12/3 Urine: enterococcus (pansens) 12/3 Respiratory >> serratia 12/2 Blood >> NEG   ANTIBIOTICS:  Zosyn 12/3 >>> 12/4 Vancomycin 12/3 >>> 12/08 Levaquin 12/3 >>>   VITAL SIGNS: Temp:  [97.5 F (36.4 C)-98.4 F (36.9 C)] 98.1 F (36.7 C) (12/08 2353) Pulse Rate:  [90-118] 118  (12/09 0600) Resp:  [24-42] 42  (12/09 0600) BP: (90-108)/(41-56) 102/45 mmHg (12/09 0600) SpO2:  [91 %-100 %] 94 % (12/09 0600) Weight:  [47.1 kg (103 lb 13.4 oz)] 47.1 kg (103 lb 13.4 oz) (12/09 0100) HEMODYNAMICS:   VENTILATOR SETTINGS:   INTAKE / OUTPUT: Intake/Output      12/08 0701 - 12/09 0700 12/09 0701 - 12/10 0700   I.V. (mL/kg) 746.6 (15.9)    Other     NG/GT 340    IV Piggyback 700    Total Intake(mL/kg) 1786.6 (37.9)    Urine (mL/kg/hr) 1150 (1)    Total Output 1150    Net +636.6         Stool Occurrence 3 x      PHYSICAL EXAMINATION: General:  Frail 51 year old female.  Chronically ill  Neuro:  RASS 0, + F/C, diffusely profoundly weak HEENT:  Temporal wasting.  Cardiovascular:  RRR s  M Lungs:  No wheezes anteriorly, dependent rales Abdomen:  Firm, + bowel sounds  Musculoskeletal:  weak Skin:  Anasarca much improved. 1-2+ LE edema  LABS: Cbc  Lab 06/13/12 0354 06/12/12 0635 06/11/12 0500  WBC 13.1* -- --  HGB 9.8* 10.1* 10.2*  HCT 30.0* 31.6* 32.2*  PLT 23* 20* 19*   Chemistry  Lab 06/13/12 0354 06/12/12 0635 06/11/12 0500 06/07/12 0125  NA 138 139 140 --  K 4.0 3.0* 3.8 --  CL 102 102 106 --  CO2 29 32 29 --  BUN 15 10 10  --  CREATININE 0.39* 0.40* 0.44* --  CALCIUM 7.9* 7.8* 8.2* --  MG -- -- -- 2.2  PHOS -- -- -- 5.8*  GLUCOSE 93 81 71 --   Liver fxn  Lab 06/12/12 0635 06/09/12 0338 06/08/12 0432  AST 58* 80* 102*  ALT 47* 61* 69*  ALKPHOS 126* 148* 159*  BILITOT 1.4* 1.3* 1.3*  PROT 3.9* 4.1* 4.5*  ALBUMIN 1.3* 1.5* 1.7*   coags  Lab 06/12/12 0635 06/10/12 0520 06/08/12 0432 06/06/12 2105  APTT 42* 39* -- 69*  INR 1.48 1.46 1.73* --   Sepsis markers  Lab 06/12/12 4098 06/09/12 0337 06/08/12 0432 06/08/12 0422 06/07/12 1255 06/07/12 0125  LATICACIDVEN -- -- -- 1.9  3.0* 1.9  PROCALCITON 0.48 1.88 1.10 -- -- --   Cardiac markers  Lab 06/06/12 2105  CKTOTAL --  CKMB --  TROPONINI <0.30   BNP  Lab 06/12/12 0635  PROBNP 20971.0*   ABG  Lab 06/13/12 0700 06/09/12 0416 06/08/12 0422  PHART 7.391 7.406 7.388  PCO2ART 48.0* 42.2 37.6  PO2ART 41.1* 122.0* 156.0*  HCO3 28.5* 26.4* 22.9  TCO2 26.6 24.1 21.0   CBG trend  Lab 06/13/12 0827 06/13/12 0024 06/12/12 1543 06/12/12 0824 06/12/12 0052  GLUCAP 94 93 92 81 91   PCT: 0.48  IMAGING: No new CXR 12/08   Intake/Output Summary (Last 24 hours) at 06/13/12 9604 Last data filed at 06/13/12 0600  Gross per 24 hour  Intake 1719.2 ml  Output    950 ml  Net  769.2 ml   ECG:  IMPRESSION:  PMH of :  Neuromuscular disorder, apparently progressive - not fully defined despite extensive Neurology eval in past.   MS has been entertained in past and r/o'd  Profound  debilitation, bed bound X months  H/O anorexia nervosa  Severe,prolonged malnutrition compromising bodily function  FMS, PTSD, IBS  Presentation 12/03 of:  Acute respiratory failure, now resolving, extubated 12/06  Acute encephalopathy, resolved  Severe anemia of unclear etiology, no evidence of acute blood loss  Severe thrombocytopenia of unclear etiology  Coagulopathy  Suspected Severe sepsis/septic shock  Suspected CAP  Severe sacral decubitus ulcer   Current active problems:  Acute respiratory failure, now resolving  extubated 12/06 and failing due to hypoxic respiratory failure.   Shock circulatory, suspect cardiogenic > septic, resolving  Shock resolved, anticipate BP will drop again post intubation, will rely more on levophed rather than fluid.   Dilated cardiomyopathy, unclear etiology   Suspected CAP, serratia on resp cx  Completing course of Levofloxacin   Severe sacral decubitus pressure ulcer  WOC and CCS following   Enterococcal UTI (pansensitive)   Thrombocytopenia of unclear etiology   No evidence of actvie bleeding  No indication for plts tx   Coagulopathy, mild   No evidence of actvie bleeding.  No indication for blood products.  If have to thora then will need platelet transfusion.   Abnormal LFTs - improving   Anasarca, resolving  DISCUSSION: Suspect that much or most of her illness is attributable to profound malnutrition which is likely the most reversible component of her overall illness. Thrombocytopenia is hard to explain on this basis alone. Dilated CM can be a result of severe prolonged malnutrition. This appears much better with milrinone and anasarca is resolving with diuresis. She is unable to take much nutrition by mouth due to weakness and anorexia. I believe that we should focus on treating the problems already defined focusing primarily on improving her nutritional status and monitor the less-well defined problems for resolution. She  might even benefit from a PEG for long term nutritional support. She would likely benefit from SNF after this hospitalization.  Will reintubate today and discuss trach placement given progressive disease.  Will need to discuss case with neuro for ?IVIG or plasmapheresis.  PLAN:  Intubate and mechanically ventilate with F/U ABG. Low dose lasix. Cont milrinone infusion. Wean NE to off for MAP > 55 mmHg. Resume TFs. Cont current abx for now - complete 7/10 days. Cont wound care. Monitor CBC, BMET, LFTs intermittently. If a thora is to be done will need platelet transfusion.  Alyson Reedy, M.D. [facility] Pulmonary/Critical Care Medicine. Pager: [phone]. After hours pager:  [phone]. 

## 2012-06-13 NOTE — Progress Notes (Signed)
Chaplain saw pt due to re-intubation this morning, length of stay, chronic illness.    Provided support with pt's husband, [name], at bedside.   Expressed feelings of uncertainty / fear in not knowing what to expect -- pt was doing better and now intubated.  Provided support around fears, engaged in narrative work with husband. 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  Her illness began at 73 and they have walked together through 20 years of care.  They are supported by network of friends and family.  [name] is hopeful that she will recover, but spoke and prayed with chaplain about wanting God's will and for [name] not to suffer.    Will continue to follow for support.    Belva Crome  MDiv, Chaplain

## 2012-06-13 NOTE — Procedures (Signed)
Bronchoscopy Procedure Note [name] [id number] [birth year]  Procedure: Bronchoscopy Indications: Remove secretions  Procedure Details Consent: Unable to obtain consent because of emergent medical necessity. Time Out: Verified patient identification, verified procedure, site/side was marked, verified correct patient position, special equipment/implants available, medications/allergies/relevent history reviewed, required imaging and test results available.  Performed  In preparation for procedure, patient was given 100% FiO2 and bronchoscope lubricated. Sedation: Benzodiazepines and Etomidate  Airway entered and the following bronchi were examined: RUL, RML, RLL, LUL, LLL and Bronchi.   Procedures performed: Brushings performed Bronchoscope removed.    Evaluation Hemodynamic Status: BP stable throughout; O2 sats: stable throughout Patient's Current Condition: stable Specimens:  None Complications: No apparent complications Patient did tolerate procedure well.   [name] 06/13/2012

## 2012-06-13 NOTE — Progress Notes (Signed)
General surgery attending note:  Patient was personally examined by me today. I examined her sacral decubitus ulcers and her lower extremity skin lesions.  The sacral decubitus ulcer looks better than last week. There is still some slough but no cellulitis or undrained pus. . I do not think that this is a source of sepsis unless there is occult osteomyelitis. I think that  the correct treatment for the ischeal/sacral decubitus ulcers is daily hydrotherapy, daily Santyl, and dressing changes.  The lower extremity superficial skin lesions will be treated with daily Silvadene, Telfa, and Kerlex, no tape to skin.  We will follow, and probably see patient twice a week. Wound care plan was discussed at bedside with physical therapy staff.   Angelia Mould. Derrell Lolling, M.D., [facility] General and Minimally invasive Surgery Breast and Colorectal Surgery Office:   [phone] Pager:   [phone]

## 2012-06-13 NOTE — Progress Notes (Signed)
eLink Physician-Brief Progress Note Patient Name: [name] DOB: 12/30/1960 MRN: [id number]  Date of Service  06/13/2012   HPI/Events of Note     eICU Interventions  protonix for SUP   Intervention Category Intermediate Interventions: Best-practice therapies (e.g. DVT, beta blocker, etc.)  ALVA,RAKESH V. 06/13/2012, 3:21 PM

## 2012-06-13 NOTE — Plan of Care (Signed)
Problem: Phase II Progression Outcomes Goal: Time pt extubated/weaned off vent Pt re-intubated at 0930. Goal: Hemodynamically stable Milrinone gtt continued and Levophed gtt restarted.

## 2012-06-13 NOTE — Progress Notes (Addendum)
Nutrition Follow-up  Intervention:   1.  Enteral nutrition; with next L bottle change once TF appropriate to resume transition pt to Osmolite 1.2 @ 20 mL/hr.  Advance by 5 mL q 6 hrs to 40 mL/hr goal to provide 1152 kcal, 53g protein, 688 mL free water.  Assessment:   Pt admitted for AMS x3 days PTA.  Pt with limited mobility r/t possible MS, several ulcers requiring bedside debridement, and extremely poor nutrition status with h/o anorexia nervosa.  Pt was extubated last Friday, however with desaturations today requiring reintubation.  Pt previously was able to initiation and tolerate Osmolite 1.2 which was advanced to goal slowly due to MD's preference to nutritionally manage refeeding risk.  Pt was at goal for <24 hrs prior to TF's held for extubation.  Pt was unable to eat PO after extubation and an NGT was replaced Sunday and Vital 1.2 started at 20 mL/hr.    Pt continues liquid MVI for supplementation.  Patient is currently intubated on ventilator support.  MV: 6.3 L/min Temp:Temp (24hrs), Avg:98.1 F (36.7 C), Min:97.5 F (36.4 C), Max:98.5 F (36.9 C)  Discussed with NP who authorizes RD to adjust TF to better meet pt's estimated needs. Note bleeding from mouth.  Diet Order:  NPO  Meds: Scheduled Meds:   . antiseptic oral rinse  1 application Mouth Rinse QID  . chlorhexidine  15 mL Mouth/Throat BID  . collagenase   Topical Daily  . collagenase   Topical Daily  . [COMPLETED] etomidate      . etomidate  16 mg Intravenous Once  . [COMPLETED] etomidate  5 mg Intravenous Once  . furosemide  20 mg Intravenous Q6H  . levofloxacin (LEVAQUIN) IV  750 mg Intravenous QHS  . lidocaine (cardiac) 100 mg/40ml      . [COMPLETED] midazolam      . [COMPLETED] midazolam      . multivitamin  5 mL Oral Daily  . [COMPLETED] potassium chloride  10 mEq Intravenous Q1 Hr x 4  . [COMPLETED] potassium chloride  40 mEq Per Tube Once  . rocuronium      . sodium chloride  250 mL Intravenous Once   . succinylcholine       Continuous Infusions:   . dextrose 5 % with kcl 30 mL/hr at 06/13/12 1031  . feeding supplement (VITAL AF 1.2 CAL) 1,000 mL (06/12/12 1127)  . milrinone 0.125 mcg/kg/min (06/13/12 0649)  . norepinephrine (LEVOPHED) Adult infusion 8 mcg/min (06/13/12 1031)   PRN Meds:.fentaNYL, fentaNYL, midazolam   CMP     Component Value Date/Time   NA 138 06/13/2012 0354   K 4.0 06/13/2012 0354   CL 102 06/13/2012 0354   CO2 29 06/13/2012 0354   GLUCOSE 93 06/13/2012 0354   BUN 15 06/13/2012 0354   CREATININE 0.39* 06/13/2012 0354   CALCIUM 7.9* 06/13/2012 0354   PROT 3.9* 06/12/2012 0635   ALBUMIN 1.3* 06/12/2012 0635   AST 58* 06/12/2012 0635   ALT 47* 06/12/2012 0635   ALKPHOS 126* 06/12/2012 0635   BILITOT 1.4* 06/12/2012 0635   GFRNONAA >90 06/13/2012 0354   GFRAA >90 06/13/2012 0354    CBG (last 3)   Basename 06/13/12 0827 06/13/12 0024 06/12/12 1543  GLUCAP 94 93 92     Intake/Output Summary (Last 24 hours) at 06/13/12 1149 Last data filed at 06/13/12 1031  Gross per 24 hour  Intake 1682.75 ml  Output    915 ml  Net 767.75 ml   Pt with  3 BMs yesterday, 1 so far today.  Weight Status:  103 lbs, stable  Re-estimated needs:  1213 kcal, 56-65g protein  Nutrition Dx:  Inadequate oral intake, ongoing  Monitor:   1.  Enteral nutrition; transition to standard formula.  Advancement to goal rate for pt to meet >/=90% of estimated needs. 2.  Gastrointestinal; monitor stool output.   Loyce Dys, MS RD LDN Clinical Inpatient Dietitian Pager: [phone] Weekend/After hours pager: [phone]

## 2012-06-13 NOTE — Progress Notes (Signed)
eLink Physician-Brief Progress Note Patient Name: [name] DOB: 06-Feb-1961 MRN: [id number]  Date of Service  06/13/2012   HPI/Events of Note  Call from bedside nurse concerned re abrupt drop in sats in patient recently extubated.  Had been stable through night but now with sats in the 60s.  Placed on 100% NRB with increase in sats to 70s.  The waveform on the sat monitor appears accurate tracking current HR.  HD stable.   eICU Interventions  Plan: Stat ABG PCXR Initiate BiPAP Reported event to Brett Canales Minor NP, PCCM service   Intervention Category Intermediate Interventions: Respiratory distress - evaluation and management  DETERDING,Nikka 06/13/2012, 7:11 AM

## 2012-06-13 NOTE — Progress Notes (Signed)
Subjective: Back on Vent now with some bleeding from her mouth.  Objective: Vital signs in last 24 hours: Temp:  [97.5 F (36.4 C)-98.5 F (36.9 C)] 98.5 F (36.9 C) (12/09 0800) Pulse Rate:  [86-119] 86  (12/09 1005) Resp:  [14-42] 14  (12/09 1005) BP: (71-115)/(40-57) 77/46 mmHg (12/09 1005) SpO2:  [76 %-100 %] 99 % (12/09 1005) FiO2 (%):  [60 %-100 %] 60 % (12/09 1104) Weight:  [103 lb 13.4 oz (47.1 kg)] 103 lb 13.4 oz (47.1 kg) (12/09 0100) Last BM Date: 06/12/12  Intake/Output from previous day: 12/08 0701 - 12/09 0700 In: 1818.4 [I.V.:778.4; NG/GT:340; IV Piggyback:700] Out: 1150 [Urine:1150] Intake/Output this shift: Total I/O In: 101.1 [I.V.:101.1] Out: 65 [Urine:65]  General appearance: sedated on the ventilator. Incision/Wound:  The dressing was removed and it was looking much better.  It was re measured by PT and is now 4 cm deep x 5 x 5 cm wide.  It has some tunneling going down 6 cm at the 9 o'clock position and 6.5 cm at the 8 o'clock position.  She was digitally explored by Dr. Derrell Lolling and it is open and no source of infection is noted.  Nothing seems to be pocketed off. The rectum does not appear to be involved. The entire base at the end of debridement area  was black necrotic appearing tissue.  We stopped when the pt had sensation. 95% of that tissue is gone and there is pink normal appearing tissue at the base.  She still has some non viable tissue at the base edges on one side, 6'oclock to noon side, but again,  it looks much better. Legs:  She has some tissue that is breaking down almost like an ulcer, on the posterior ankle where she had extensive excoriation and edema in her lower legs on admission.  We will leave some instrucitions for this today. Lab Results:   [facility] 06/13/12 0354 06/12/12 0635  WBC 13.1* 15.7*  HGB 9.8* 10.1*  HCT 30.0* 31.6*  PLT 23* 20*    BMET  Basename 06/13/12 0354 06/12/12 0635  NA 138 139  K 4.0 3.0*  CL 102 102  CO2  29 32  GLUCOSE 93 81  BUN 15 10  CREATININE 0.39* 0.40*  CALCIUM 7.9* 7.8*   PT/INR  Basename 06/12/12 0635  LABPROT 17.5*  INR 1.48     Lab 06/12/12 0635 06/09/12 0338 06/08/12 0432 06/06/12 2240 06/06/12 2105  AST 58* 80* 102* 99* 110*  ALT 47* 61* 69* 69* 78*  ALKPHOS 126* 148* 159* 176* 199*  BILITOT 1.4* 1.3* 1.3* 0.7 0.8  PROT 3.9* 4.1* 4.5* 3.8* 4.2*  ALBUMIN 1.3* 1.5* 1.7* 1.5* 1.7*     Lipase  No results found for this basename: lipase     Studies/Results: Dg Chest Port 1 View  06/13/2012  *RADIOLOGY REPORT*  Clinical Data: Status post intubation  PORTABLE CHEST - 1 VIEW  Comparison: 06/13/2012 at 0726 hours  Findings: Endotracheal tube terminates 2 cm above the carina.  Stable bilateral airspace opacities, possibly reflecting edema or multifocal infection.  Moderate bilateral pleural effusions, right greater than left.  No pneumothorax.  Stable left subclavian venous catheter with its tip at the cavoatrial junction.  Enteric tube terminates in the gastric body.  IMPRESSION: Endotracheal tube terminates 2 cm above the carina.  Otherwise unchanged.   Original Report Authenticated By: Charline Bills, M.D.    Dg Chest Port 1 View  06/13/2012  *RADIOLOGY REPORT*  Clinical Data: Worsening  shortness breath and hypoxia.  Status post thoracentesis.  PORTABLE CHEST - 1 VIEW  Comparison: 06/13/2012  Findings: Significant decrease in the left pleural effusion is seen.  No pneumothorax identified.  Moderate to large right pleural effusion shows no significant change.  Bilateral pulmonary air space disease is also stable.  Bibasilar compressive atelectasis again demonstrated secondary to pleural effusions.  Heart size is stable.  Left subclavian center venous catheter and nasogastric remain in appropriate position.  IMPRESSION:  1.  Decrease in size of left pleural effusion.  No pneumothorax identified. 2.  Stable moderate to large right pleural effusion and bilateral airspace disease.    Original Report Authenticated By: Myles Rosenthal, M.D.    Dg Chest Port 1 View  06/13/2012  *RADIOLOGY REPORT*  Clinical Data: Airspace disease.  Respiratory failure.  PORTABLE CHEST - 1 VIEW  Comparison: 06/11/2012  Findings: Shallow inspiration.  Normal heart size and pulmonary vascularity.  Bilateral perihilar infiltrates are stable. Bilateral pleural effusions appear increased.  No pneumothorax. Central venous catheter is unchanged in position.  Interval placement of an enteric tube with tip in the left upper quadrant likely in the stomach.  IMPRESSION: Appliances in satisfactory apparent location.  Persistent bilateral perihilar infiltrates.  Increasing bilateral pleural effusions.   Original Report Authenticated By: Burman Nieves, M.D.     Medications:    . antiseptic oral rinse  1 application Mouth Rinse QID  . chlorhexidine  15 mL Mouth/Throat BID  . collagenase   Topical Daily  . collagenase   Topical Daily  . [COMPLETED] etomidate      . etomidate  16 mg Intravenous Once  . [COMPLETED] etomidate  5 mg Intravenous Once  . furosemide  20 mg Intravenous Q6H  . levofloxacin (LEVAQUIN) IV  750 mg Intravenous QHS  . lidocaine (cardiac) 100 mg/34ml      . [COMPLETED] midazolam      . [COMPLETED] midazolam      . multivitamin  5 mL Oral Daily  . [COMPLETED] potassium chloride  10 mEq Intravenous Q1 Hr x 4  . [COMPLETED] potassium chloride  40 mEq Per Tube Once  . rocuronium      . sodium chloride  250 mL Intravenous Once  . succinylcholine        Assessment/Plan 1. Altered mental status with metabolic encephalopathy.  2. Probable community-acquired pneumonia bilateral with acute respiratory failure; ventilator dependent, extubated last Friday, back on vent 06/13/12 3. 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  4. Sepsis with acidosis, 2.3 lactic acid; WBC 29,500. On Pressors.  5. Severe anemia with a hemoglobin 2.8/hct 10.3  6. Thrombocytopenia with  platelet count of 23,000.  7. Acute liver failure INR 2.6  8. Bilateral decubitus, minimal on the left. Large 6 x 7 x 4 cm area of the right ischium with 100% slough.  9. Malnutrition and deconditioning  10. Hypothermia  11.EF 25-30% with diffuse hypokinesis   Plan:  Continue the hydro Rx, there is an area opened digitally by Dr. Derrell Lolling and it was bleeding some.  We plan to continue dressing changes wet to dry with Kerlix.  Dr Derrell Lolling has explained that this needs to be packed too.  Dressing changes to open/excoriated areas of lower legs and feet. Dress with silvadene and no tape on the skin    LOS: 7 days    [name] 06/13/2012

## 2012-06-14 ENCOUNTER — Inpatient Hospital Stay (HOSPITAL_COMMUNITY): Payer: Managed Care, Other (non HMO)

## 2012-06-14 LAB — BLOOD GAS, ARTERIAL
Acid-Base Excess: 4.3 mmol/L — ABNORMAL HIGH (ref 0.0–2.0)
Bicarbonate: 28.2 mEq/L — ABNORMAL HIGH (ref 20.0–24.0)
Drawn by: 244901
FIO2: 0.4 %
MECHVT: 450 mL
O2 Saturation: 99.5 %
PEEP: 5 cmH2O
Patient temperature: 99.6
RATE: 16 resp/min
TCO2: 25.8 mmol/L (ref 0–100)
pCO2 arterial: 42.1 mmHg (ref 35.0–45.0)
pH, Arterial: 7.443 (ref 7.350–7.450)
pO2, Arterial: 166 mmHg — ABNORMAL HIGH (ref 80.0–100.0)

## 2012-06-14 LAB — GLUCOSE, CAPILLARY
Glucose-Capillary: 80 mg/dL (ref 70–99)
Glucose-Capillary: 86 mg/dL (ref 70–99)

## 2012-06-14 LAB — CBC
HCT: 27.2 % — ABNORMAL LOW (ref 36.0–46.0)
Hemoglobin: 8.5 g/dL — ABNORMAL LOW (ref 12.0–15.0)
MCH: 25.8 pg — ABNORMAL LOW (ref 26.0–34.0)
MCHC: 31.3 g/dL (ref 30.0–36.0)
MCV: 82.7 fL (ref 78.0–100.0)
Platelets: 28 10*3/uL — CL (ref 150–400)
RBC: 3.29 MIL/uL — ABNORMAL LOW (ref 3.87–5.11)
RDW: 24.2 % — ABNORMAL HIGH (ref 11.5–15.5)
WBC: 13.5 10*3/uL — ABNORMAL HIGH (ref 4.0–10.5)

## 2012-06-14 LAB — BASIC METABOLIC PANEL
BUN: 13 mg/dL (ref 6–23)
CO2: 29 mEq/L (ref 19–32)
Calcium: 7.6 mg/dL — ABNORMAL LOW (ref 8.4–10.5)
Chloride: 106 mEq/L (ref 96–112)
Creatinine, Ser: 0.36 mg/dL — ABNORMAL LOW (ref 0.50–1.10)
GFR calc Af Amer: >90 mL/min (ref >90)
GFR calc non Af Amer: >90 mL/min (ref >90)
Glucose, Bld: 79 mg/dL (ref 70–99)
Potassium: 3.4 mEq/L — ABNORMAL LOW (ref 3.5–5.1)
Sodium: 140 mEq/L (ref 135–145)

## 2012-06-14 LAB — MAGNESIUM: Magnesium: 1.6 mg/dL (ref 1.5–2.5)

## 2012-06-14 LAB — PHOSPHORUS: Phosphorus: 2.7 mg/dL (ref 2.3–4.6)

## 2012-06-14 MED ORDER — FUROSEMIDE 10 MG/ML IJ SOLN
INTRAMUSCULAR | Status: AC
  Filled 2012-06-14: qty 4

## 2012-06-14 MED ORDER — FUROSEMIDE 10 MG/ML IJ SOLN
20.0000 mg | Freq: Four times a day (QID) | INTRAMUSCULAR | Status: AC
  Administered 2012-06-14 (×2): 20 mg via INTRAVENOUS
  Filled 2012-06-14: qty 2

## 2012-06-14 MED ORDER — POTASSIUM CHLORIDE 20 MEQ/15ML (10%) PO LIQD
40.0000 meq | Freq: Three times a day (TID) | ORAL | Status: AC
  Administered 2012-06-14 (×2): 40 meq
  Filled 2012-06-14 (×2): qty 30

## 2012-06-14 MED ORDER — ALBUMIN HUMAN 25 % IV SOLN
25.0000 g | Freq: Four times a day (QID) | INTRAVENOUS | Status: AC
  Administered 2012-06-14 – 2012-06-15 (×4): 25 g via INTRAVENOUS
  Filled 2012-06-14 (×4): qty 100

## 2012-06-14 NOTE — Progress Notes (Addendum)
Nutrition Follow-up  Intervention:   1.  Nutrition-related labs;  RD contacted lab for testing capabilities of various vitamins and minerals.  RD questioning status of Vitamin E, copper, B1, thiamine, niacin, and B6. 2.  Enteral nutrition; advancement as ordered.  Would still consider transition to Osmolite, however BM frequency has improved.  Will monitor renal function with increased protein provided via Vital.  Assessment:   Pt admitted for AMS x3 days PTA. Pt with limited mobility r/t possible MS, several ulcers requiring bedside debridement, and extremely poor nutrition status with h/o anorexia nervosa.   Pt was extubated last Friday, however with desaturations today requiring reintubation. Pt previously was able to initiation and tolerate Osmolite 1.2 which was advanced to goal slowly due to MD's preference to nutritionally manage refeeding risk. Pt was at goal for <24 hrs prior to TF's held for extubation. Pt was unable to eat PO after extubation and an NGT was replaced Sunday and Vital 1.2 started at 20 mL/hr which provides 576 kcal, 36g protein, 364 mL free water. Pt continues this regimen.  Will discuss with RN.  Pt continues liquid MVI for supplementation.  Discussed poor nutrition status with MD who would like to investigate the extent of pt's possible vitamin/mineral deficiencies.  RD contacted lab for testing capabilities of various vitamins and minerals.  RD questioning status of Vitamin E, Vitamin D, copper, B1, thiamine, niacin, and B6.  Current vitamin/mineral status: Phosphorus  Date Value Range Status  06/14/2012 2.7  2.3 - 4.6 mg/dL Final   Magnesium  Date Value Range Status  06/14/2012 1.6  1.5 - 2.5 mg/dL Final   Vitamin Z-61  Date Value Range Status  06/10/2012 >2000* 211 - 911 pg/mL Final   Iron/TIBC/Ferritin    Component Value Date/Time   IRON 13* 06/10/2012 0520   TIBC 210* 06/10/2012 0520   FERRITIN 296* 06/10/2012 0520  RBC folate: 1101 ng/mL  Elevated     Diet Order:  NPO, Osmolite 1.2 @ 40, however currently on Vital 1.2 @ 20 mL/hr  Meds: Scheduled Meds:   . albumin human  25 g Intravenous Q6H  . antiseptic oral rinse  1 application Mouth Rinse QID  . chlorhexidine  15 mL Mouth/Throat BID  . collagenase   Topical Daily  . collagenase   Topical Daily  . furosemide  20 mg Intravenous Q6H  . levofloxacin (LEVAQUIN) IV  750 mg Intravenous QHS  . [EXPIRED] lidocaine (cardiac) 100 mg/38ml      . multivitamin  5 mL Oral Daily  . pantoprazole sodium  40 mg Per Tube Daily  . potassium chloride  40 mEq Per Tube Q8H  . [EXPIRED] rocuronium      . silver sulfADIAZINE   Topical Daily  . [COMPLETED] sodium chloride  1,000 mL Intravenous Once  . [EXPIRED] succinylcholine      . [DISCONTINUED] furosemide  20 mg Intravenous Q6H   Continuous Infusions:   . sodium chloride 20 mL/hr at 06/14/12 1103  . feeding supplement (OSMOLITE 1.2 CAL) 1,000 mL (06/13/12 1230)  . milrinone 0.125 mcg/kg/min (06/13/12 0649)  . norepinephrine (LEVOPHED) Adult infusion 5 mcg/min (06/14/12 1101)  . [DISCONTINUED] dextrose 5 % with kcl 30 mL/hr at 06/13/12 1031  . [DISCONTINUED] feeding supplement (VITAL AF 1.2 CAL) 1,000 mL (06/12/12 1127)   PRN Meds:.fentaNYL, midazolam, [DISCONTINUED] fentaNYL   CMP     Component Value Date/Time   NA 140 06/14/2012 0500   K 3.4* 06/14/2012 0500   CL 106 06/14/2012 0500  CO2 29 06/14/2012 0500   GLUCOSE 79 06/14/2012 0500   BUN 13 06/14/2012 0500   CREATININE 0.36* 06/14/2012 0500   CALCIUM 7.6* 06/14/2012 0500   PROT 3.9* 06/12/2012 0635   ALBUMIN 1.3* 06/12/2012 0635   AST 58* 06/12/2012 0635   ALT 47* 06/12/2012 0635   ALKPHOS 126* 06/12/2012 0635   BILITOT 1.4* 06/12/2012 0635   GFRNONAA >90 06/14/2012 0500   GFRAA >90 06/14/2012 0500    CBG (last 3)   Basename 06/14/12 0802 06/13/12 2354 06/13/12 1648  GLUCAP 80 86 83     Intake/Output Summary (Last 24 hours) at 06/14/12 1144 Last data filed at 06/14/12  1000  Gross per 24 hour  Intake 1918.02 ml  Output    657 ml  Net 1261.02 ml    Weight Status: 106 lbs, stable   Re-estimated needs: 1213 kcal, 56-65g protein   Nutrition Dx: Inadequate oral intake, ongoing   Monitor:  1. Enteral nutrition; transition to standard formula. Advancement to goal rate for pt to meet >/=90% of estimated needs.  2. Gastrointestinal; monitor stool output.   Loyce Dys, MS RD LDN Clinical Inpatient Dietitian Pager: [phone] Weekend/After hours pager: [phone]

## 2012-06-14 NOTE — Progress Notes (Signed)
Physical Therapy PLS Note  06/14/12 1200  Subjective Assessment  Subjective Pt still on vent  Pressure Ulcer 06/07/12 Unstageable - Full thickness tissue loss in which the base of the ulcer is covered by slough (yellow, tan, gray, green or brown) and/or eschar (tan, brown or black) in the wound bed. Large Round eshcar   Date First Assessed/Time First Assessed: 06/07/12 0300   Location: Buttocks  Location Orientation: Right  Staging: Unstageable - Full thickness tissue loss in which the base of the ulcer is covered by slough (yellow, tan, gray, green or brown) and/or esc  State of Healing Early/partial granulation  Site / Wound Assessment Bleeding;Yellow;Pink;Brown  % Wound base Red or Granulating 25%  % Wound base Yellow 55%  % Wound base Black 10%  Peri-wound Assessment Pink  Margins Unattacted edges (unapproximated)  Drainage Amount Moderate  Drainage Description Serosanguineous;Sanguineous  Dressing Type Moist to dry  Dressing Dry;Intact  Hydrotherapy  Pulsed Lavage with Suction (psi) (8-12psi)  Pulsed Lavage with Suction - Normal Saline Used 1000 mL  Pulsed Lavage Tip Tip with splash shield  Pulsed lavage therapy - wound location R sacral  Selective Debridement  Selective Debridement - Location sacral  Selective Debridement - Tools Used Forceps;Scissors  Selective Debridement - Tissue Removed slough  Wound Therapy - Assess/Plan/Recommendations  Wound Therapy - Clinical Statement eschar softening; pt  c/o pain and RN gave additional meds during tx; dtr-in-law stayed during tx session, face mask offerred to her but declined; wound should progress with multi-modal tx; Call Will on Monday prior to treatment  Factors Delaying/Impairing Wound Healing Infection - systemic/local;Multiple medical problems;Immobility  Hydrotherapy Plan Debridement;Dressing change;Pulsatile lavage with suction  Wound Therapy - Frequency 6X / week  Wound Therapy - Current Recommendations OT;PT  Wound Therapy  - Follow Up Recommendations Skilled nursing facility;Other (comment) (?LTACH)  Wound Plan PLS and selective debridement    Wound Therapy Goals - Improve the function of patient's integumentary system by progressing the wound(s) through the phases of wound healing by:  Decrease Necrotic Tissue to 50  Increase Granulation Tissue to 50  Improve Drainage Characteristics Min  Patient/Family will be able to  position pt. in bed to decrease pressure.  Time For Goal Achievement 2 weeks  Wound Therapy - Potential for Goals Good    Felecia Shelling  PTA [facility]  Acute  Rehab Pager     [phone]

## 2012-06-14 NOTE — Progress Notes (Addendum)
PULMONARY  / CRITICAL CARE MEDICINE  Name: [name] MRN: [id number] DOB: 1961/04/24    LOS: 8  REFERRING MD :  EDP  CHIEF COMPLAINT:  FTT and AMS  BRIEF PATIENT DESCRIPTION: 51 yo with limited mobility due to poorly neuromuscular disease, last seen by Neurologist about 2 years ago, brought to [facility] ED with 3 d h/o altered mental status, and FTT X 3 mo. In ED: hypotensive, hypoglycemic, hypothermic, bradycardic, Hb 3.0, WBC 30. Intubated for AMS. Admitting dx of severe sepsis of unclear source  SIGNIFICANT EVENTS:  12/04 transfused 4 units PRBCs 12/04 Echo: EF 20-25%, diffuse HK 12/04 KUB: There is a single dilated loop of small bowel in the the left mid abdomen 12/05 Abd [place]: Bilateral pleural effusions and moderate ascites. Fatty infiltration of the liver. Biliary sludge without stones or acute changes  LINES / TUBES:  12/3 ETT >> 12/06>>>12/9>>> 12/3 L [place] CVL >>    CULTURES:   12/3 Urine: enterococcus (pansens) 12/3 Respiratory >> serratia 12/2 Blood >> NEG   ANTIBIOTICS:  Zosyn 12/3 >>> 12/4 Vancomycin 12/3 >>> 12/08 Levaquin 12/3 >>>   VITAL SIGNS: Temp:  [97.7 F (36.5 C)-99.9 F (37.7 C)] 99.6 F (37.6 C) (12/10 0400) Pulse Rate:  [86-121] 92  (12/10 0600) Resp:  [14-23] 16  (12/10 0600) BP: (71-120)/(40-64) 102/61 mmHg (12/10 0600) SpO2:  [76 %-100 %] 100 % (12/10 0600) FiO2 (%):  [30 %-100 %] 30 % (12/10 0740) Weight:  [48.2 kg (106 lb 4.2 oz)] 48.2 kg (106 lb 4.2 oz) (12/10 0400) HEMODYNAMICS: CVP:  [2 mmHg-6 mmHg] 6 mmHg VENTILATOR SETTINGS: Vent Mode:  [-] PRVC FiO2 (%):  [30 %-100 %] 30 % Set Rate:  [14 bmp-16 bmp] 16 bmp Vt Set:  [450 mL] 450 mL PEEP:  [5 cmH20] 5 cmH20 Plateau Pressure:  [18 cmH20-22 cmH20] 19 cmH20 INTAKE / OUTPUT: Intake/Output      12/09 0701 - 12/10 0700 12/10 0701 - 12/11 0700   I.V. (mL/kg) 1297.3 (26.9)    NG/GT 320    IV Piggyback 150    Total Intake(mL/kg) 1767.3 (36.7)    Urine (mL/kg/hr) 572 (0.5)    Total  Output 572    Net +1195.3         Stool Occurrence 1 x      PHYSICAL EXAMINATION: General:  Frail 51 year old female.  Chronically ill  Neuro:  RASS 0, + F/C, diffusely profoundly weak HEENT:  Temporal wasting.  Cardiovascular:  RRR s M Lungs:  No wheezes anteriorly, dependent rales Abdomen:  Firm, + bowel sounds  Musculoskeletal:  weak Skin:  Anasarca much improved. 1-2+ LE edema  LABS: Cbc  Lab 06/14/12 0500 06/13/12 0354 06/12/12 0635  WBC 13.5* -- --  HGB 8.5* 9.8* 10.1*  HCT 27.2* 30.0* 31.6*  PLT 28* 23* 20*   Chemistry  Lab 06/14/12 0500 06/13/12 0354 06/12/12 0635  NA 140 138 139  K 3.4* 4.0 3.0*  CL 106 102 102  CO2 29 29 32  BUN 13 15 10   CREATININE 0.36* 0.39* 0.40*  CALCIUM 7.6* 7.9* 7.8*  MG 1.6 -- --  PHOS 2.7 -- --  GLUCOSE 79 93 81   Liver fxn  Lab 06/12/12 0635 06/09/12 0338 06/08/12 0432  AST 58* 80* 102*  ALT 47* 61* 69*  ALKPHOS 126* 148* 159*  BILITOT 1.4* 1.3* 1.3*  PROT 3.9* 4.1* 4.5*  ALBUMIN 1.3* 1.5* 1.7*   coags  Lab 06/12/12 7829 06/10/12 0520 06/08/12 5621  APTT 42* 39* --  INR 1.48 1.46 1.73*   Sepsis markers  Lab 06/12/12 0635 06/09/12 0337 06/08/12 0432 06/08/12 0422 06/07/12 1255  LATICACIDVEN -- -- -- 1.9 3.0*  PROCALCITON 0.48 1.88 1.10 -- --   Cardiac markers No results found for this basename: CKTOTAL:3,CKMB:3,TROPONINI:3 in the last 168 hours BNP  Lab 06/12/12 0635  PROBNP 20971.0*   ABG  Lab 06/14/12 0444 06/13/12 1042 06/13/12 0700  PHART 7.443 7.412 7.391  PCO2ART 42.1 45.3* 48.0*  PO2ART 166.0* 181.0* 41.1*  HCO3 28.2* 28.3* 28.5*  TCO2 25.8 26.3 26.6   CBG trend  Lab 06/14/12 0802 06/13/12 2354 06/13/12 1648 06/13/12 0827 06/13/12 0024  GLUCAP 80 86 83 94 93   PCT: 0.48  IMAGING: No new CXR 12/08   Intake/Output Summary (Last 24 hours) at 06/14/12 0846 Last data filed at 06/14/12 0981  Gross per 24 hour  Intake 1715.5 ml  Output    572 ml  Net 1143.5 ml   ECG:  IMPRESSION:  PMH  of :  Neuromuscular disorder, apparently progressive - not fully defined despite extensive Neurology eval in past.   MS has been entertained in past and r/o'd  Profound debilitation, bed bound X months  H/O anorexia nervosa  Severe,prolonged malnutrition compromising bodily function  FMS, PTSD, IBS  Presentation 12/03 of:  Acute respiratory failure, now resolving, extubated 12/06  Acute encephalopathy, resolved  Severe anemia of unclear etiology, no evidence of acute blood loss  Severe thrombocytopenia of unclear etiology  Coagulopathy  Suspected Severe sepsis/septic shock  Suspected CAP  Severe sacral decubitus ulcer   Current active problems:  Acute respiratory failure, now resolving  extubated 12/06 and failing due to hypoxic respiratory failure.   Shock circulatory, suspect cardiogenic > septic, resolving  Shock resolved, anticipate BP will drop again post intubation, will rely more on levophed rather than fluid.   Dilated cardiomyopathy, unclear etiology   Suspected CAP, serratia on resp cx  Completing course of Levofloxacin   Severe sacral decubitus pressure ulcer  WOC and CCS following   Enterococcal UTI (pansensitive)   Thrombocytopenia of unclear etiology   No evidence of actvie bleeding  No indication for plts tx   Coagulopathy, mild   No evidence of actvie bleeding.  No indication for blood products.  If have to thora then will need platelet transfusion.   Abnormal LFTs - improving   Anasarca, resolving  DISCUSSION: Suspect that much or most of her illness is attributable to profound malnutrition which is likely the most reversible component of her overall illness. Thrombocytopenia is hard to explain on this basis alone but likely related to chronic malnutrition compounded by sepsis induced bone marrow suppression.  Dilated CM can be a result of severe prolonged malnutrition as well.  This appears much better with milrinone but anasarca remains an issue  given low UOP. She is unable to take much nutrition by mouth due to weakness and anorexia. I believe that we should focus on treating the problems already defined focusing primarily on improving her nutritional status and monitor the less-well defined problems for resolution of the neuromuscular blockade.  Patient decompensated on 12/9 and required reintubation (and was quite a difficult airway).  I do not foresee the patient extubating successfully.  The progressive malnutrition and muscle weakness are unlikely to resolve quickly.  PLAN:  - Continue mechanical support. - Cont milrinone infusion. - Wean NE to off for MAP > 55 mmHg. - Resume TFs. - Cont current abx for now (  levofloxacin) - complete 8/10 days. - Cont wound care. - Monitor CBC, BMET, LFTs intermittently. - If a thora is to be done will need platelet transfusion. - Will give albumin and lasix today to assist with anasarca. - I spoke with the husband extensively today, the patient is unlikely to successfully liberate for the ventilator for very long given active illness.  Teh question of trach/peg/SNF placement was posed.  He will bring the rest of the family in Wednesday or Thursday for a more in depth discussion regarding her plan of care.  CC time 40 min.  Alyson Reedy, M.D. [facility] Pulmonary/Critical Care Medicine. Pager: [phone]. After hours pager: [phone].

## 2012-06-15 ENCOUNTER — Inpatient Hospital Stay (HOSPITAL_COMMUNITY): Payer: Managed Care, Other (non HMO)

## 2012-06-15 LAB — BASIC METABOLIC PANEL
BUN: 11 mg/dL (ref 6–23)
CO2: 31 mEq/L (ref 19–32)
Calcium: 8 mg/dL — ABNORMAL LOW (ref 8.4–10.5)
Chloride: 99 mEq/L (ref 96–112)
Creatinine, Ser: 0.35 mg/dL — ABNORMAL LOW (ref 0.50–1.10)
GFR calc Af Amer: >90 mL/min (ref >90)
GFR calc non Af Amer: >90 mL/min (ref >90)
Glucose, Bld: 100 mg/dL — ABNORMAL HIGH (ref 70–99)
Potassium: 2.6 mEq/L — CL (ref 3.5–5.1)
Sodium: 136 mEq/L (ref 135–145)

## 2012-06-15 LAB — CBC
HCT: 23.6 % — ABNORMAL LOW (ref 36.0–46.0)
Hemoglobin: 7.5 g/dL — ABNORMAL LOW (ref 12.0–15.0)
MCH: 26.2 pg (ref 26.0–34.0)
MCHC: 31.8 g/dL (ref 30.0–36.0)
MCV: 82.5 fL (ref 78.0–100.0)
Platelets: 34 10*3/uL — ABNORMAL LOW (ref 150–400)
RBC: 2.86 MIL/uL — ABNORMAL LOW (ref 3.87–5.11)
RDW: 24.1 % — ABNORMAL HIGH (ref 11.5–15.5)
WBC: 7.9 10*3/uL (ref 4.0–10.5)

## 2012-06-15 LAB — BLOOD GAS, ARTERIAL
Acid-Base Excess: 7.9 mmol/L — ABNORMAL HIGH (ref 0.0–2.0)
Bicarbonate: 30.5 mEq/L — ABNORMAL HIGH (ref 20.0–24.0)
Drawn by: 362741
FIO2: 0.3 %
MECHVT: 450 mL
O2 Saturation: 99.5 %
PEEP: 5 cmH2O
Patient temperature: 98.6
RATE: 16 resp/min
TCO2: 28.3 mmol/L (ref 0–100)
pCO2 arterial: 33.9 mmHg — ABNORMAL LOW (ref 35.0–45.0)
pH, Arterial: 7.562 — ABNORMAL HIGH (ref 7.350–7.450)
pO2, Arterial: 116 mmHg — ABNORMAL HIGH (ref 80.0–100.0)

## 2012-06-15 LAB — MAGNESIUM: Magnesium: 1.6 mg/dL (ref 1.5–2.5)

## 2012-06-15 LAB — GLUCOSE, CAPILLARY
Glucose-Capillary: 121 mg/dL — ABNORMAL HIGH (ref 70–99)
Glucose-Capillary: 71 mg/dL (ref 70–99)
Glucose-Capillary: 93 mg/dL (ref 70–99)
Glucose-Capillary: 95 mg/dL (ref 70–99)

## 2012-06-15 LAB — PHOSPHORUS: Phosphorus: 2.5 mg/dL (ref 2.3–4.6)

## 2012-06-15 LAB — VITAMIN D 25 HYDROXY (VIT D DEFICIENCY, FRACTURES): Vit D, 25-Hydroxy: 10 ng/mL — ABNORMAL LOW (ref 30–89)

## 2012-06-15 MED ORDER — ALBUMIN HUMAN 25 % IV SOLN
12.5000 g | Freq: Four times a day (QID) | INTRAVENOUS | Status: AC
Start: 2012-06-15 — End: 2012-06-16
  Administered 2012-06-15 – 2012-06-16 (×3): 12.5 g via INTRAVENOUS
  Filled 2012-06-15 (×4): qty 50

## 2012-06-15 MED ORDER — FUROSEMIDE 10 MG/ML IJ SOLN
20.0000 mg | Freq: Four times a day (QID) | INTRAMUSCULAR | Status: AC
  Administered 2012-06-15 (×3): 20 mg via INTRAVENOUS
  Filled 2012-06-15 (×2): qty 2

## 2012-06-15 MED ORDER — POTASSIUM CHLORIDE 10 MEQ/50ML IV SOLN
10.0000 meq | INTRAVENOUS | Status: AC
  Administered 2012-06-15 (×4): 10 meq via INTRAVENOUS

## 2012-06-15 MED ORDER — POTASSIUM CHLORIDE 20 MEQ/15ML (10%) PO LIQD
40.0000 meq | Freq: Three times a day (TID) | ORAL | Status: AC
  Administered 2012-06-15 (×2): 40 meq
  Filled 2012-06-15 (×2): qty 30

## 2012-06-15 MED ORDER — POTASSIUM CHLORIDE 10 MEQ/50ML IV SOLN
INTRAVENOUS | Status: AC
  Filled 2012-06-15: qty 200

## 2012-06-15 MED ORDER — FUROSEMIDE 10 MG/ML IJ SOLN
INTRAMUSCULAR | Status: AC
  Filled 2012-06-15: qty 4

## 2012-06-15 NOTE — Progress Notes (Signed)
PHYSICAL THERAPY WOUND CARE PN  06/15/12  10:29 - 11:04  Subjective Assessment  Subjective Pt still on vent but today awake and responsive/non verbal due to vent tube  Patient and Family Stated Goals per spouse to heal her wound  Pressure Ulcer 06/07/12 Unstageable - Full thickness tissue loss in which the base of the ulcer is covered by slough (yellow, tan, gray, green or brown) and/or eschar (tan, brown or black) in the wound bed. Large Round eshcar   Date First Assessed/Time First Assessed: 06/07/12 0300   Location: Buttocks  Location Orientation: Right  Staging: Unstageable - Full thickness tissue loss in which the base of the ulcer is covered by slough (yellow, tan, gray, green or brown) and/or esc  State of Healing Early/partial granulation  Site / Wound Assessment Bleeding;Yellow;Pink;Brown  % Wound base Red or Granulating 25%  % Wound base Yellow 55%  % Wound base Black 10%  Peri-wound Assessment Pink  Margins Unattacted edges (unapproximated)  Drainage Amount Moderate  Drainage Description Serosanguineous;Sanguineous  Dressing Type Moist to dry  Dressing Dry;Intact  Hydrotherapy  Pulsed Lavage with Suction (psi) (8-12psi)  Pulsed Lavage with Suction - Normal Saline Used 1000 mL  Pulsed Lavage Tip Tip with splash shield  Pulsed lavage therapy - wound location R sacral  Selective Debridement  Selective Debridement - Location sacral  Selective Debridement - Tools Used Forceps;Scissors  Selective Debridement - Tissue Removed slough  Wound Therapy - Assess/Plan/Recommendations  Wound Therapy - Clinical Statement eschar softening; pt  c/o pain and RN gave additional meds during tx; dtr-in-law stayed during tx session, face mask offerred to her but declined; wound should progress with multi-modal tx; Call Will on Monday prior to treatment  Factors Delaying/Impairing Wound Healing Infection - systemic/local;Multiple medical problems;Immobility  Hydrotherapy Plan Debridement;Dressing  change;Pulsatile lavage with suction  Wound Therapy - Frequency 6X / week  Wound Therapy - Current Recommendations OT;PT  Wound Therapy - Follow Up Recommendations Skilled nursing facility;Other (comment) (?LTACH)  Wound Therapy Goals - Improve the function of patient's integumentary system by progressing the wound(s) through the phases of wound healing by:  Decrease Necrotic Tissue to 50  Increase Granulation Tissue to 50  Improve Drainage Characteristics Min  Patient/Family will be able to  position pt. in bed to decrease pressure.  Time For Goal Achievement 2 weeks  Wound Therapy - Potential for Goals Good   Felecia Shelling  PTA [facility]  Acute  Rehab Pager     [phone]

## 2012-06-15 NOTE — Progress Notes (Signed)
PULMONARY  / CRITICAL CARE MEDICINE  Name: [name] MRN: [id number] DOB: 02-25-1961    LOS: 9  REFERRING MD :  EDP  CHIEF COMPLAINT:  FTT and AMS  BRIEF PATIENT DESCRIPTION: 51 yo with limited mobility due to poorly neuromuscular disease, last seen by Neurologist about 2 years ago, brought to [facility] ED with 3 d h/o altered mental status, and FTT X 3 mo. In ED: hypotensive, hypoglycemic, hypothermic, bradycardic, Hb 3.0, WBC 30. Intubated for AMS. Admitting dx of severe sepsis of unclear source  SIGNIFICANT EVENTS:  12/04 transfused 4 units PRBCs 12/04 Echo: EF 20-25%, diffuse HK 12/04 KUB: There is a single dilated loop of small bowel in the the left mid abdomen 12/05 Abd [place]: Bilateral pleural effusions and moderate ascites. Fatty infiltration of the liver. Biliary sludge without stones or acute changes  LINES / TUBES:  12/3 ETT >> 12/06>>>12/9>>> 12/3 L [place] CVL >>    CULTURES:   12/3 Urine: enterococcus (pansens) 12/3 Respiratory >> serratia 12/2 Blood >> NEG   ANTIBIOTICS:  Zosyn 12/3 >>> 12/4 Vancomycin 12/3 >>> 12/08 Levaquin 12/3 >>>   VITAL SIGNS: Temp:  [97.1 F (36.2 C)-98.9 F (37.2 C)] 98.9 F (37.2 C) (12/11 0808) Pulse Rate:  [81-105] 105  (12/11 0700) Resp:  [16-18] 16  (12/11 0700) BP: (84-130)/(34-81) 129/58 mmHg (12/11 0700) SpO2:  [96 %-100 %] 100 % (12/11 0700) FiO2 (%):  [30 %] 30 % (12/11 0808) HEMODYNAMICS: CVP:  [2 mmHg-6 mmHg] 2 mmHg VENTILATOR SETTINGS: Vent Mode:  [-] PRVC FiO2 (%):  [30 %] 30 % Set Rate:  [16 bmp] 16 bmp Vt Set:  [450 mL] 450 mL PEEP:  [5 cmH20] 5 cmH20 Plateau Pressure:  [14 cmH20-20 cmH20] 14 cmH20 INTAKE / OUTPUT: Intake/Output      12/10 0701 - 12/11 0700 12/11 0701 - 12/12 0700   I.V. (mL/kg) 423.8 (8.8)    Other 150    NG/GT 455    IV Piggyback 350    Total Intake(mL/kg) 1378.8 (28.6)    Urine (mL/kg/hr) 150 (0.1)    Emesis/NG output 3200    Total Output 3350    Net -1971.2         Stool  Occurrence 2 x      PHYSICAL EXAMINATION: General:  Frail 51 year old female.  Chronically ill  Neuro:  RASS 0, + F/C, diffusely profoundly weak HEENT:  Temporal wasting.  Cardiovascular:  RRR s M Lungs:  No wheezes anteriorly, dependent rales Abdomen:  Firm, + bowel sounds  Musculoskeletal:  weak Skin:  Anasarca much improved. 1-2+ LE edema  LABS: Cbc  Lab 06/15/12 0530 06/14/12 0500 06/13/12 0354  WBC 7.9 -- --  HGB 7.5* 8.5* 9.8*  HCT 23.6* 27.2* 30.0*  PLT 34* 28* 23*   Chemistry  Lab 06/15/12 0530 06/14/12 0500 06/13/12 0354  NA PENDING 140 138  K PENDING 3.4* 4.0  CL PENDING 106 102  CO2 31 29 29   BUN 11 13 15   CREATININE 0.35* 0.36* 0.39*  CALCIUM 8.0* 7.6* 7.9*  MG 1.6 1.6 --  PHOS 2.5 2.7 --  GLUCOSE 100* 79 93   Liver fxn  Lab 06/12/12 0635 06/09/12 0338  AST 58* 80*  ALT 47* 61*  ALKPHOS 126* 148*  BILITOT 1.4* 1.3*  PROT 3.9* 4.1*  ALBUMIN 1.3* 1.5*   coags  Lab 06/12/12 0635 06/10/12 0520  APTT 42* 39*  INR 1.48 1.46   Sepsis markers  Lab 06/12/12 0635 06/09/12 4098  LATICACIDVEN -- --  PROCALCITON 0.48 1.88   Cardiac markers No results found for this basename: CKTOTAL:3,CKMB:3,TROPONINI:3 in the last 168 hours BNP  Lab 06/12/12 0635  PROBNP 20971.0*   ABG  Lab 06/15/12 0420 06/14/12 0444 06/13/12 1042  PHART 7.562* 7.443 7.412  PCO2ART 33.9* 42.1 45.3*  PO2ART 116.0* 166.0* 181.0*  HCO3 30.5* 28.2* 28.3*  TCO2 28.3 25.8 26.3   CBG trend  Lab 06/15/12 0800 06/15/12 0003 06/14/12 1718 06/14/12 0802 06/13/12 2354  GLUCAP 95 71 93 80 86   PCT: 0.48  IMAGING: No new CXR 12/08  Intake/Output Summary (Last 24 hours) at 06/15/12 1478 Last data filed at 06/15/12 0600  Gross per 24 hour  Intake   1287 ml  Output   3290 ml  Net  -2003 ml   ECG:  IMPRESSION:  PMH of :  Neuromuscular disorder, apparently progressive - not fully defined despite extensive Neurology eval in past.   MS has been entertained in past and  r/o'd  Profound debilitation, bed bound X months  H/O anorexia nervosa  Severe,prolonged malnutrition compromising bodily function  FMS, PTSD, IBS  Presentation 12/03 of:  Acute respiratory failure, now resolving, extubated 12/06  Acute encephalopathy, resolved  Severe anemia of unclear etiology, no evidence of acute blood loss  Severe thrombocytopenia of unclear etiology  Coagulopathy  Suspected Severe sepsis/septic shock  Suspected CAP  Severe sacral decubitus ulcer   Current active problems:  Acute respiratory failure, now resolving  extubated 12/06 and failing due to hypoxic respiratory failure.   Shock circulatory, suspect cardiogenic > septic, resolving  Shock resolved, anticipate BP will drop again post intubation, will rely more on levophed rather than fluid.   Dilated cardiomyopathy, unclear etiology   Suspected CAP, serratia on resp cx  Completing course of Levofloxacin   Severe sacral decubitus pressure ulcer  WOC and CCS following   Enterococcal UTI (pansensitive)   Thrombocytopenia of unclear etiology   No evidence of actvie bleeding  No indication for plts tx   Coagulopathy, mild   No evidence of actvie bleeding.  No indication for blood products.  If have to thora then will need platelet transfusion.   Abnormal LFTs - improving   Anasarca, resolving  DISCUSSION: Suspect that much or most of her illness is attributable to profound malnutrition which is likely the most reversible component of her overall illness. Thrombocytopenia is hard to explain on this basis alone but likely related to chronic malnutrition compounded by sepsis induced bone marrow suppression.  Dilated CM can be a result of severe prolonged malnutrition as well.  This appears much better with milrinone but anasarca remains an issue given low UOP. She is unable to take much nutrition by mouth due to weakness and anorexia. I believe that we should focus on treating the problems already  defined focusing primarily on improving her nutritional status and monitor the less-well defined problems for resolution of the neuromuscular blockade.  Patient decompensated on 12/9 and required reintubation (and was quite a difficult airway).  I do not foresee the patient extubating successfully.  The progressive malnutrition and muscle weakness are unlikely to resolve quickly.  PLAN:  - Continue mechanical support. - Begin PS trials. - Cont milrinone and levophed infusion. - Wean NE to off for MAP > 55 mmHg. - Resume TFs. - Cont current abx for now (levofloxacin) - complete 9/10 days. - Cont wound care. - Monitor CBC, BMET, LFTs intermittently. - If a thora is to be done will need  platelet transfusion. - Will give albumin and lasix today again to assist with anasarca, was very effective on 10/10. - Spoke with husband and updated him, the res of the family is coming in today and will decide course of action, they are not of the idea of a tracheostomy/peg placement but will confirm and clarify code status upon arrival of the family.  CC time 40 min.  Alyson Reedy, M.D. [facility] Pulmonary/Critical Care Medicine. Pager: [phone]. After hours pager: [phone].

## 2012-06-15 NOTE — Progress Notes (Signed)
Nutrition Brief Note:  Pt following for tolerance of TF- Osmolite 1.2 @ 30 mL/hr which provides 720 kcal, 39g protein, 590 mL free water. Pt with 2 BMs yesterday. Residuals: 0 mL  Also following for nutrition-related labs.  RD order Vitamin E, D, and B1 levels be checked yesterday.  Vitamin D status low at 10 ng/mL.  RD to continue to follow for adequacy of nutrition regimen and return of lab work.  Loyce Dys, MS RD LDN Clinical Inpatient Dietitian Pager: [phone] Weekend/After hours pager: [phone]

## 2012-06-15 NOTE — Progress Notes (Signed)
Chaplain follow up for support with pt, husband Jorja Loa) and mother Erskine Squibb).   Pt awake, communicative by hand gestures and nods.  Provided support and empathic presence, prayers with pt and family.   Belva Crome  MDiv, Chaplain

## 2012-06-16 ENCOUNTER — Inpatient Hospital Stay (HOSPITAL_COMMUNITY): Payer: Managed Care, Other (non HMO)

## 2012-06-16 DIAGNOSIS — G709 Myoneural disorder, unspecified: Secondary | ICD-10-CM

## 2012-06-16 LAB — BLOOD GAS, ARTERIAL
Acid-Base Excess: 10.4 mmol/L — ABNORMAL HIGH (ref 0.0–2.0)
Bicarbonate: 34 mEq/L — ABNORMAL HIGH (ref 20.0–24.0)
Drawn by: 232811
FIO2: 0.3 %
MECHVT: 450 mL
O2 Saturation: 99.2 %
PEEP: 5 cmH2O
Patient temperature: 98.6
RATE: 16 resp/min
TCO2: 31 mmol/L (ref 0–100)
pCO2 arterial: 40.8 mmHg (ref 35.0–45.0)
pH, Arterial: 7.53 — ABNORMAL HIGH (ref 7.350–7.450)
pO2, Arterial: 115 mmHg — ABNORMAL HIGH (ref 80.0–100.0)

## 2012-06-16 LAB — GLUCOSE, CAPILLARY
Glucose-Capillary: 118 mg/dL — ABNORMAL HIGH (ref 70–99)
Glucose-Capillary: 125 mg/dL — ABNORMAL HIGH (ref 70–99)

## 2012-06-16 LAB — BASIC METABOLIC PANEL
BUN: 11 mg/dL (ref 6–23)
CO2: 35 mEq/L — ABNORMAL HIGH (ref 19–32)
Calcium: 8.1 mg/dL — ABNORMAL LOW (ref 8.4–10.5)
Chloride: 100 mEq/L (ref 96–112)
Creatinine, Ser: 0.34 mg/dL — ABNORMAL LOW (ref 0.50–1.10)
GFR calc Af Amer: >90 mL/min (ref >90)
GFR calc non Af Amer: >90 mL/min (ref >90)
Glucose, Bld: 96 mg/dL (ref 70–99)
Potassium: 2.9 mEq/L — ABNORMAL LOW (ref 3.5–5.1)
Sodium: 139 mEq/L (ref 135–145)

## 2012-06-16 LAB — CBC
HCT: 23.9 % — ABNORMAL LOW (ref 36.0–46.0)
Hemoglobin: 7.6 g/dL — ABNORMAL LOW (ref 12.0–15.0)
MCH: 26.4 pg (ref 26.0–34.0)
MCHC: 31.8 g/dL (ref 30.0–36.0)
MCV: 83 fL (ref 78.0–100.0)
Platelets: 85 10*3/uL — ABNORMAL LOW (ref 150–400)
RBC: 2.88 MIL/uL — ABNORMAL LOW (ref 3.87–5.11)
RDW: 24.2 % — ABNORMAL HIGH (ref 11.5–15.5)
WBC: 8.6 10*3/uL (ref 4.0–10.5)

## 2012-06-16 LAB — PHOSPHORUS: Phosphorus: 2.6 mg/dL (ref 2.3–4.6)

## 2012-06-16 LAB — MAGNESIUM: Magnesium: 1.6 mg/dL (ref 1.5–2.5)

## 2012-06-16 MED ORDER — MAGNESIUM SULFATE 50 % IJ SOLN
2.0000 g | Freq: Once | INTRAVENOUS | Status: AC
  Administered 2012-06-16: 2 g via INTRAVENOUS
  Filled 2012-06-16: qty 4

## 2012-06-16 MED ORDER — POTASSIUM CHLORIDE 10 MEQ/50ML IV SOLN
10.0000 meq | INTRAVENOUS | Status: AC
  Administered 2012-06-16 (×6): 10 meq via INTRAVENOUS
  Filled 2012-06-16: qty 100
  Filled 2012-06-16 (×4): qty 50

## 2012-06-16 MED ORDER — ACETAZOLAMIDE SODIUM 500 MG IJ SOLR
250.0000 mg | Freq: Three times a day (TID) | INTRAMUSCULAR | Status: AC
  Administered 2012-06-16 (×2): 250 mg via INTRAVENOUS
  Filled 2012-06-16 (×2): qty 500

## 2012-06-16 MED ORDER — MIDAZOLAM HCL 5 MG/ML IJ SOLN
4.0000 mg | Freq: Once | INTRAMUSCULAR | Status: AC
  Administered 2012-06-16: 2 mg via INTRAVENOUS
  Filled 2012-06-16: qty 1

## 2012-06-16 MED ORDER — FUROSEMIDE 10 MG/ML IJ SOLN
INTRAMUSCULAR | Status: AC
  Filled 2012-06-16: qty 4

## 2012-06-16 MED ORDER — PROPOFOL 10 MG/ML IV EMUL: 5.0000 ug/kg/min | Freq: Once | INTRAVENOUS | Status: DC

## 2012-06-16 MED ORDER — ETOMIDATE 2 MG/ML IV SOLN
40.0000 mg | Freq: Once | INTRAVENOUS | Status: AC
  Administered 2012-06-17: 20 mg via INTRAVENOUS

## 2012-06-16 MED ORDER — FENTANYL CITRATE 0.05 MG/ML IJ SOLN
200.0000 ug | Freq: Once | INTRAMUSCULAR | Status: AC
  Administered 2012-06-17: 200 ug via INTRAVENOUS

## 2012-06-16 MED ORDER — FENTANYL CITRATE 0.05 MG/ML IJ SOLN: 200.0000 ug | Freq: Once | INTRAMUSCULAR | Status: AC

## 2012-06-16 MED ORDER — FENTANYL CITRATE 0.05 MG/ML IJ SOLN
200.0000 ug | Freq: Once | INTRAMUSCULAR | Status: DC
  Filled 2012-06-16: qty 4

## 2012-06-16 MED ORDER — POTASSIUM CHLORIDE 10 MEQ/50ML IV SOLN: 10.0000 meq | INTRAVENOUS | Status: DC

## 2012-06-16 MED ORDER — ZOLPIDEM TARTRATE 5 MG PO TABS: 5.0000 mg | ORAL_TABLET | Freq: Every evening | ORAL | Status: DC | PRN

## 2012-06-16 MED ORDER — MAGNESIUM SULFATE 50 % IJ SOLN
2.0000 g | Freq: Once | INTRAVENOUS | Status: DC
  Filled 2012-06-16: qty 4

## 2012-06-16 MED ORDER — VECURONIUM BROMIDE 10 MG IV SOLR
10.0000 mg | Freq: Once | INTRAVENOUS | Status: AC
  Administered 2012-06-17: 5 mg via INTRAVENOUS
  Filled 2012-06-16: qty 10

## 2012-06-16 MED ORDER — ALBUMIN HUMAN 25 % IV SOLN
12.5000 g | Freq: Four times a day (QID) | INTRAVENOUS | Status: AC
  Administered 2012-06-16 – 2012-06-17 (×4): 12.5 g via INTRAVENOUS
  Filled 2012-06-16 (×4): qty 50

## 2012-06-16 MED ORDER — FUROSEMIDE 10 MG/ML IJ SOLN
20.0000 mg | Freq: Four times a day (QID) | INTRAMUSCULAR | Status: AC
  Administered 2012-06-16 (×3): 20 mg via INTRAVENOUS
  Filled 2012-06-16 (×2): qty 2

## 2012-06-16 MED ORDER — ETOMIDATE 2 MG/ML IV SOLN: 40.0000 mg | Freq: Once | INTRAVENOUS | Status: DC

## 2012-06-16 MED ORDER — MIDAZOLAM HCL 5 MG/ML IJ SOLN
4.0000 mg | Freq: Once | INTRAMUSCULAR | Status: AC
  Administered 2012-06-17: 4 mg via INTRAVENOUS
  Filled 2012-06-16 (×2): qty 1

## 2012-06-16 MED ORDER — POTASSIUM PHOSPHATE DIBASIC 3 MMOLE/ML IV SOLN
30.0000 mmol | Freq: Once | INTRAVENOUS | Status: AC
  Administered 2012-06-16: 30 mmol via INTRAVENOUS
  Filled 2012-06-16: qty 10

## 2012-06-16 NOTE — Progress Notes (Signed)
General surgery attending note:  I examined this patient is decubitus wounds and her lower extremity skin lesions and during hydrotherapy today. The sacral decubitus wound is cleaning up. There is more granulation tissue and less slough. We will continue hydrotherapy, Santyl, and wound packing. The lower extremity wounds are getting better. They are very superficial. We will continue wound care in anticipation that they will slowly healed so long as there is no pressure on them.  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   Angelia Mould. Derrell Lolling, M.D., [facility] General and Minimally invasive Surgery Breast and Colorectal Surgery Office:   [phone] Pager:   [phone]

## 2012-06-16 NOTE — Progress Notes (Signed)
eLink Physician-Brief Progress Note Patient Name: KAMILLAH DIDONATO DOB: 16-Sep-1960 MRN: [id number]  Date of Service  06/16/2012   HPI/Events of Note    Lab 06/16/12 0510 06/15/12 0530 06/14/12 0500 06/13/12 0354 06/12/12 0635  NA 139 136 140 138 139  K 2.9* 2.6* -- -- --  CL 100 99 106 102 102  CO2 35* 31 29 29  32  GLUCOSE 96 100* 79 93 81  BUN 11 11 13 15 10   CREATININE 0.34* 0.35* 0.36* 0.39* 0.40*  CALCIUM 8.1* 8.0* 7.6* 7.9* 7.8*  MG 1.6 1.6 1.6 -- --  PHOS 2.6 2.5 2.7 -- --     eICU Interventions  Low K and Low Mag  Plan - replete k and mag   Intervention Category Major Interventions: Electrolyte abnormality - evaluation and management  Sadae Arrazola 06/16/2012, 6:57 AM

## 2012-06-16 NOTE — Progress Notes (Signed)
CARE MANAGEMENT NOTE 06/16/2012  Patient:  [name], [name]   Account Number:  [id number]  Date Initiated:  06/07/2012  Documentation initiated by:  DAVIS,RHONDA  Subjective/Objective Assessment:   pt with resp failure and sepsis on vent     Action/Plan:   has been at home with husband may need snf due to condition and safety.   Anticipated DC Date:  06/19/2012   Anticipated DC Plan:  SKILLED NURSING FACILITY  In-house referral  Clinical Social Worker      DC Planning Services  NA      [facility] Choice  NA   Choice offered to / List presented to:  NA   DME arranged  NA      DME agency  NA     HH arranged  NA      HH agency  NA   Status of service:  In process, will continue to follow Medicare Important Message given?  NA - LOS <3 / Initial given by admissions (If response is "NO", the following Medicare IM given date fields will be blank) Date Medicare IM given:   Date Additional Medicare IM given:    Discharge Disposition:    Per UR Regulation:  Reviewed for med. necessity/level of care/duration of stay  If discussed at Long Length of Stay Meetings, dates discussed:    Comments:  [id number] Earlene Plater, RN, BSN, CCM: CHART REVIEWED AND UPDATED.  Next chart review due on [id number]. NO DISCHARGE NEEDS PRESENT AT THIS TIME. due to age and diagnosis, family and patient are trying to decide code status and if she wants to be trached.  Prognosis of extubation is very poor. CASE MANAGEMENT [phone]   [id number] Earlene Plater, RN, BSN, CCM: CHART REVIEWED AND UPDATED. Next chart review due on [id number]. NO DISCHARGE NEEDS PRESENT AT THIS TIME. Patient reintubated am of [id number]. CASE MANAGEMENT [phone]   [id number] Earlene Plater, RN, BSN, CCM: CHART REVIEWED AND UPDATED.  Next chart review due on [id number]. NO DISCHARGE NEEDS PRESENT AT THIS TIME.  vent dependant on full support failede 1st weaning attempt. CASE MANAGEMENT [phone]  [id number]  Earlene Plater, RN, BSN, CCM: CHART REVIEWED AND UPDATED.  Next chart review due on [id number]. NO DISCHARGE NEEDS PRESENT AT THIS TIME. CASE MANAGEMENT [phone]

## 2012-06-16 NOTE — Progress Notes (Signed)
ANTIBIOTIC CONSULT NOTE   Pharmacy Consult for Levofloxacin Indication: pneumonia and rule out sepsis  Allergies  Allergen Reactions  . Scopolamine Anaphylaxis and Shortness Of Breath    Respiratory distress  . Ivp Dye (Iodinated Diagnostic Agents) Other (See Comments)    unknown    Patient Measurements: Height: 5\' 6"  (167.6 cm) Weight: 90 lb 9.7 oz (41.1 kg) IBW/kg (Calculated) : 59.3   Vital Signs: Temp: 98.6 F (37 C) (12/12 0800) Temp src: Oral (12/12 0800) BP: 98/47 mmHg (12/12 1400) Pulse Rate: 88  (12/12 1400)  Labs:  Basename 06/16/12 0510 06/15/12 0530 06/14/12 0500  WBC 8.6 7.9 13.5*  HGB 7.6* 7.5* 8.5*  PLT 85* 34* 28*  LABCREA -- -- --  CREATININE 0.34* 0.35* 0.36*   Estimated Creatinine Clearance: 55.2 ml/min (by C-G formula based on Cr of 0.34).   Microbiology: Recent Results (from the past 720 hour(s))  CULTURE, BLOOD (ROUTINE X 2)     Status: Normal   Collection Time   06/06/12  9:05 PM      Component Value Range Status Comment   Specimen Description BLOOD ARTERIAL   Final    Special Requests BOTTLES DRAWN AEROBIC AND ANAEROBIC 3CC   Final    Culture  Setup Time 06/07/2012 01:44   Final    Culture NO GROWTH 5 DAYS   Final    Report Status 06/13/2012 FINAL   Final   URINE CULTURE     Status: Normal   Collection Time   06/06/12  9:48 PM      Component Value Range Status Comment   Specimen Description URINE, CATHETERIZED   Final    Special Requests NONE   Final    Culture  Setup Time 06/07/2012 03:45   Final    Colony Count >=100,000 COLONIES/ML   Final    Culture ENTEROCOCCUS SPECIES   Final    Report Status 06/09/2012 FINAL   Final    Organism ID, Bacteria ENTEROCOCCUS SPECIES   Final   MRSA PCR SCREENING     Status: Normal   Collection Time   06/07/12  2:37 AM      Component Value Range Status Comment   MRSA by PCR NEGATIVE  NEGATIVE Final   CULTURE, RESPIRATORY     Status: Normal   Collection Time   06/07/12  3:55 AM      Component Value  Range Status Comment   Specimen Description TRACHEAL ASPIRATE   Final    Special Requests NONE   Final    Gram Stain     Final    Value: RARE WBC PRESENT, PREDOMINANTLY MONONUCLEAR     RARE SQUAMOUS EPITHELIAL CELLS PRESENT     NO ORGANISMS SEEN   Culture RARE SERRATIA MARCESCENS   Final    Report Status 06/10/2012 FINAL   Final    Organism ID, Bacteria SERRATIA MARCESCENS   Final     Medications:  Anti-infectives     Start     Dose/Rate Route Frequency Ordered Stop   06/07/12 2200   vancomycin (VANCOCIN) IVPB 1000 mg/200 mL premix  Status:  Discontinued        1,000 mg 200 mL/hr over 60 Minutes Intravenous Daily at bedtime 06/07/12 0041 06/09/12 1110   06/07/12 0600   piperacillin-tazobactam (ZOSYN) IVPB 3.375 g  Status:  Discontinued        3.375 g 12.5 mL/hr over 240 Minutes Intravenous 3 times per day 06/07/12 0039 06/08/12 1256   06/07/12 0045  levofloxacin (LEVAQUIN) IVPB 750 mg        750 mg 100 mL/hr over 90 Minutes Intravenous Daily at bedtime 06/07/12 0038 06/17/12 2159   06/06/12 2115   piperacillin-tazobactam (ZOSYN) IVPB 3.375 g        3.375 g 12.5 mL/hr over 240 Minutes Intravenous  Once 06/06/12 2102 06/07/12 0128   06/06/12 2115   vancomycin (VANCOCIN) IVPB 1000 mg/200 mL premix        1,000 mg 200 mL/hr over 60 Minutes Intravenous  Once 06/06/12 2102 06/06/12 2226         Assessment:  51 YOF admit with pneumonia, cellulitis, and infected decub.  Started on broad spectrum abx (vanc, zosyn, levaquin) initially and narrowed to levaquin only on 12/5.  Renal function stable, WBC improved, Afeb.  Urine cxt: pan-sensitive enterococcus  Respiratory cxt: rare serratia marcescens (sens:fluoroquinolones)  Today is day #10/10 Levaquin  Goal of Therapy:  Appropriate abx dosing, eradication of infection.   Plan:   Continue Levaquin 750mg  IV q24h.  Follow up orders to discontinue   Loralee Pacas, PharmD, BCPS Pager: [phone] 06/16/2012 2:48  PM

## 2012-06-16 NOTE — Progress Notes (Deleted)
Elink: 5:12 AM @ 06/16/2012      Lab 06/15/12 0530 06/14/12 0500 06/13/12 0354 06/12/12 0635 06/11/12 0500  NA 136 140 138 139 140  K 2.6* 3.4* -- -- --  CL 99 106 102 102 106  CO2 31 29 29  32 29  GLUCOSE 100* 79 93 81 71  BUN 11 13 15 10 10   CREATININE 0.35* 0.36* 0.39* 0.40* 0.44*  CALCIUM 8.0* 7.6* 7.9* 7.8* 8.2*  MG 1.6 1.6 -- -- --  PHOS 2.5 2.7 -- -- --   Estimated Creatinine Clearance: 55.2 ml/min (by C-G formula based on Cr of 0.35).   A Hypokalemia Hypomag  P  Replete both   Dr. Kalman Shan, M.D., [facility].C.P Pulmonary and Critical Care Medicine Staff Physician [facility] [place] [facility]monary and Critical Care Pager: [phone], If no answer or between  15:00h - 7:00h: call [phone]  06/16/2012 5:13 AM   ADDENDUM  Labs was yesterdays - so mag and k canceled

## 2012-06-16 NOTE — Progress Notes (Signed)
06/16/12 1100  Subjective Assessment  Subjective pt on vent but alert and using hand signals to communicate  Patient and Family Stated Goals per spouse to heal her wound  Prior Treatments dressing changes at home by spouse.  Pressure Ulcer 06/07/12 Unstageable - Full thickness tissue loss in which the base of the ulcer is covered by slough (yellow, tan, gray, green or brown) and/or eschar (tan, brown or black) in the wound bed. Large Round eshcar   Date First Assessed/Time First Assessed: 06/07/12 0300   Location: Buttocks  Location Orientation: Right  Staging: Unstageable - Full thickness tissue loss in which the base of the ulcer is covered by slough (yellow, tan, gray, green or brown) and/or esc  State of Healing Early/partial granulation  Site / Wound Assessment Bleeding;Yellow;Pink;Brown;Dusky  % Wound base Red or Granulating 30%  % Wound base Yellow 50%  % Wound base Black 20%  Peri-wound Assessment Intact  Margins Unattacted edges (unapproximated)  Drainage Amount Moderate  Drainage Description No odor;Serosanguineous;Sanguineous  Treatment Debridement (Selective);Hydrotherapy (Pulse lavage);Packing (Saline gauze)  Dressing Type Moist to dry (Santyl, Allevyn)  Dressing Changed  Hydrotherapy  Pulsed Lavage with Suction (psi) 8 psi  Pulsed Lavage with Suction - Normal Saline Used 1000 mL  Pulsed Lavage Tip Tip with splash shield  Pulsed lavage therapy - wound location R sacral  Selective Debridement  Selective Debridement - Location sacrum  Selective Debridement - Tools Used Forceps;Scissors  Selective Debridement - Tissue Removed slough  Wound Therapy - Assess/Plan/Recommendations  Wound Therapy - Clinical Statement Pt on vent again now; communicative with hand signals; pain well controlled this session; family not present for tx today; will provide communication board to pt later today; Ts coordinated with Will Marlyne Beards and Dr. Derrell Lolling; will continue to follow, wound progressing  slowly  Factors Delaying/Impairing Wound Healing Infection - systemic/local;Multiple medical problems;Immobility  Hydrotherapy Plan Debridement;Dressing change;Pulsatile lavage with suction  Wound Therapy - Frequency 6X / week  Wound Therapy - Follow Up Recommendations Skilled nursing facility;Other (comment)  Wound Plan PLS and selective debridement    Wound Therapy Goals - Improve the function of patient's integumentary system by progressing the wound(s) through the phases of wound healing by:  Decrease Necrotic Tissue to 50  Decrease Necrotic Tissue - Progress Progressing toward goal  Increase Granulation Tissue to 50  Increase Granulation Tissue - Progress Progressing toward goal  Improve Drainage Characteristics Min  Improve Drainage Characteristics - Progress Progressing toward goal  Time For Goal Achievement 2 weeks  Wound Therapy - Potential for Goals Fair

## 2012-06-16 NOTE — Progress Notes (Signed)
PULMONARY  / CRITICAL CARE MEDICINE  Name: [name] MRN: [id number] DOB: March 17, 1961    LOS: 10  REFERRING MD :  EDP  CHIEF COMPLAINT:  FTT and AMS  BRIEF PATIENT DESCRIPTION: 51 yo with limited mobility due to poorly neuromuscular disease, last seen by Neurologist about 2 years ago, brought to [facility] ED with 3 d h/o altered mental status, and FTT X 3 mo. In ED: hypotensive, hypoglycemic, hypothermic, bradycardic, Hb 3.0, WBC 30. Intubated for AMS. Admitting dx of severe sepsis of unclear source  SIGNIFICANT EVENTS:  12/04 transfused 4 units PRBCs 12/04 Echo: EF 20-25%, diffuse HK 12/04 KUB: There is a single dilated loop of small bowel in the the left mid abdomen 12/05 Abd [place]: Bilateral pleural effusions and moderate ascites. Fatty infiltration of the liver. Biliary sludge without stones or acute changes  LINES / TUBES:  12/3 ETT >> 12/06>>>12/9>>> 12/3 L [place] CVL >>    CULTURES:   12/3 Urine: enterococcus (pansens) 12/3 Respiratory >> serratia 12/2 Blood >> NEG   ANTIBIOTICS:  Zosyn 12/3 >>> 12/4 Vancomycin 12/3 >>> 12/08 Levaquin 12/3 >>>   VITAL SIGNS: Temp:  [98.1 F (36.7 C)-99.3 F (37.4 C)] 98.6 F (37 C) (12/12 0400) Pulse Rate:  [83-102] 93  (12/12 0900) Resp:  [15-22] 21  (12/12 0900) BP: (93-123)/(38-68) 102/48 mmHg (12/12 0900) SpO2:  [94 %-100 %] 94 % (12/12 0900) FiO2 (%):  [30 %] 30 % (12/12 0800) Weight:  [41.1 kg (90 lb 9.7 oz)] 41.1 kg (90 lb 9.7 oz) (12/12 0100) HEMODYNAMICS:   VENTILATOR SETTINGS: Vent Mode:  [-] PSV;CPAP FiO2 (%):  [30 %] 30 % Set Rate:  [16 bmp-19 bmp] 16 bmp Vt Set:  [450 mL] 450 mL PEEP:  [5 cmH20] 5 cmH20 Pressure Support:  [10 cmH20] 10 cmH20 Plateau Pressure:  [17 cmH20-19 cmH20] 18 cmH20 INTAKE / OUTPUT: Intake/Output      12/11 0701 - 12/12 0700 12/12 0701 - 12/13 0700   I.V. (mL/kg) 635.6 (15.5) 47.4 (1.2)   Other     NG/GT 820 80   IV Piggyback 404 100   Total Intake(mL/kg) 1859.6 (45.2) 227.4 (5.5)   Urine (mL/kg/hr) 4665 (4.7) 30   Emesis/NG output     Total Output 4665 30   Net -2805.5 +197.4        Stool Occurrence 2 x      PHYSICAL EXAMINATION: General:  Frail 51 year old female.  Chronically ill, much more alert and interactive this AM. Neuro:  RASS 0, + F/C, diffusely profoundly weak. HEENT:  Temporal wasting.  Cardiovascular:  RRR s M. Lungs:  No wheezes anteriorly, dependent rales. Abdomen:  Firm, + bowel sounds.  Musculoskeletal:  Weak. Skin:  Anasarca much improved. 1-2+ LE edema.  LABS: Cbc  Lab 06/16/12 0510 06/15/12 0530 06/14/12 0500  WBC 8.6 -- --  HGB 7.6* 7.5* 8.5*  HCT 23.9* 23.6* 27.2*  PLT 85* 34* 28*   Chemistry  Lab 06/16/12 0510 06/15/12 0530 06/14/12 0500  NA 139 136 140  K 2.9* 2.6* 3.4*  CL 100 99 106  CO2 35* 31 29  BUN 11 11 13   CREATININE 0.34* 0.35* 0.36*  CALCIUM 8.1* 8.0* 7.6*  MG 1.6 1.6 1.6  PHOS 2.6 2.5 2.7  GLUCOSE 96 100* 79   Liver fxn  Lab 06/12/12 0635  AST 58*  ALT 47*  ALKPHOS 126*  BILITOT 1.4*  PROT 3.9*  ALBUMIN 1.3*   coags  Lab 06/12/12 4098 06/10/12 0520  APTT 42* 39*  INR 1.48 1.46   Sepsis markers  Lab 06/12/12 0635  LATICACIDVEN --  PROCALCITON 0.48   Cardiac markers No results found for this basename: CKTOTAL:3,CKMB:3,TROPONINI:3 in the last 168 hours BNP  Lab 06/12/12 0635  PROBNP 20971.0*   ABG  Lab 06/16/12 0453 06/15/12 0420 06/14/12 0444  PHART 7.530* 7.562* 7.443  PCO2ART 40.8 33.9* 42.1  PO2ART 115.0* 116.0* 166.0*  HCO3 34.0* 30.5* 28.2*  TCO2 31.0 28.3 25.8   CBG trend  Lab 06/16/12 0752 06/15/12 1537 06/15/12 0800 06/15/12 0003 06/14/12 1718  GLUCAP 118* 121* 95 71 93   PCT: 0.48  IMAGING: No new CXR 12/08  Intake/Output Summary (Last 24 hours) at 06/16/12 6962 Last data filed at 06/16/12 0900  Gross per 24 hour  Intake 1884.4 ml  Output   4395 ml  Net -2510.6 ml   ECG:  IMPRESSION:  PMH of :  Neuromuscular disorder, apparently progressive - not fully  defined despite extensive Neurology eval in past.   MS has been entertained in past and r/o'd  Profound debilitation, bed bound X months  H/O anorexia nervosa  Severe,prolonged malnutrition compromising bodily function  FMS, PTSD, IBS  Presentation 12/03 of:  Acute respiratory failure, now resolving, extubated 12/06  Acute encephalopathy, resolved  Severe anemia of unclear etiology, no evidence of acute blood loss  Severe thrombocytopenia of unclear etiology  Coagulopathy  Suspected Severe sepsis/septic shock  Suspected CAP  Severe sacral decubitus ulcer   Current active problems:  Acute respiratory failure, now resolving  extubated 12/06 and failing due to hypoxic respiratory failure.   Shock circulatory, suspect cardiogenic > septic, resolving  Shock resolved, anticipate BP will drop again post intubation, will rely more on levophed rather than fluid.   Dilated cardiomyopathy, unclear etiology   Suspected CAP, serratia on resp cx  Completing course of Levofloxacin   Severe sacral decubitus pressure ulcer  WOC and CCS following   Enterococcal UTI (pansensitive)   Thrombocytopenia of unclear etiology   No evidence of actvie bleeding  No indication for plts tx   Coagulopathy, mild   No evidence of actvie bleeding.  No indication for blood products.  If have to thora then will need platelet transfusion.   Abnormal LFTs - improving   Anasarca, resolving  DISCUSSION: Suspect that much or most of her illness is attributable to profound malnutrition which is likely the most reversible component of her overall illness. Thrombocytopenia is hard to explain on this basis alone but likely related to chronic malnutrition compounded by sepsis induced bone marrow suppression.  Dilated CM can be a result of severe prolonged malnutrition as well.  This appears much better with milrinone but anasarca remains an issue given low UOP. She is unable to take much nutrition by mouth due to  weakness and anorexia. I believe that we should focus on treating the problems already defined focusing primarily on improving her nutritional status and monitor the less-well defined problems for resolution of the neuromuscular blockade.  Patient decompensated on 12/9 and required reintubation (and was quite a difficult airway).  I do not foresee the patient extubating successfully.  The progressive malnutrition and muscle weakness are unlikely to resolve quickly.  PLAN:  - PS trials and SBT in AM. - Cont milrinone and levophed infusion. - Wean NE to off for MAP > 55 mmHg. - Continue TFs. - Cont current abx for now (levofloxacin) - completed 10/10 days. - Cont wound care. - Monitor CBC, BMET, LFTs intermittently. -  If a thora is to be done will need platelet transfusion. - Will give albumin and lasix today again to assist with anasarca, was very effective on 10/10. - Patient is much more alert and interactive this AM, posed the question of trach/peg to her and her husband, I feel that given how awake she is the decision should be hers regarding trach/peg.  I will give them an hour then come back for a decision.  CC time 40 min.  Alyson Reedy, M.D. [facility] Pulmonary/Critical Care Medicine. Pager: [phone]. After hours pager: [phone].

## 2012-06-17 ENCOUNTER — Inpatient Hospital Stay (HOSPITAL_COMMUNITY): Payer: Managed Care, Other (non HMO)

## 2012-06-17 LAB — BASIC METABOLIC PANEL
BUN: 13 mg/dL (ref 6–23)
CO2: 31 mEq/L (ref 19–32)
Calcium: 8.1 mg/dL — ABNORMAL LOW (ref 8.4–10.5)
Chloride: 100 mEq/L (ref 96–112)
Creatinine, Ser: 0.35 mg/dL — ABNORMAL LOW (ref 0.50–1.10)
GFR calc Af Amer: >90 mL/min (ref >90)
GFR calc non Af Amer: >90 mL/min (ref >90)
Glucose, Bld: 110 mg/dL — ABNORMAL HIGH (ref 70–99)
Potassium: 2.1 mEq/L — CL (ref 3.5–5.1)
Sodium: 137 mEq/L (ref 135–145)

## 2012-06-17 LAB — PROTIME-INR
INR: 1.37 (ref 0.00–1.49)
Prothrombin Time: 16.5 seconds — ABNORMAL HIGH (ref 11.6–15.2)

## 2012-06-17 LAB — BLOOD GAS, ARTERIAL
Acid-Base Excess: 6.8 mmol/L — ABNORMAL HIGH (ref 0.0–2.0)
Bicarbonate: 29.7 mEq/L — ABNORMAL HIGH (ref 20.0–24.0)
Drawn by: 336861
FIO2: 0.3 %
MECHVT: 450 mL
O2 Saturation: 99.4 %
PEEP: 5 cmH2O
Patient temperature: 37.6
RATE: 16 resp/min
TCO2: 27.8 mmol/L (ref 0–100)
pCO2 arterial: 36 mmHg (ref 35.0–45.0)
pH, Arterial: 7.528 — ABNORMAL HIGH (ref 7.350–7.450)
pO2, Arterial: 119 mmHg — ABNORMAL HIGH (ref 80.0–100.0)

## 2012-06-17 LAB — CBC
HCT: 22.1 % — ABNORMAL LOW (ref 36.0–46.0)
Hemoglobin: 7.2 g/dL — ABNORMAL LOW (ref 12.0–15.0)
MCH: 27.1 pg (ref 26.0–34.0)
MCHC: 32.6 g/dL (ref 30.0–36.0)
MCV: 83.1 fL (ref 78.0–100.0)
Platelets: 115 10*3/uL — ABNORMAL LOW (ref 150–400)
RBC: 2.66 MIL/uL — ABNORMAL LOW (ref 3.87–5.11)
RDW: 25.1 % — ABNORMAL HIGH (ref 11.5–15.5)
WBC: 6.2 10*3/uL (ref 4.0–10.5)

## 2012-06-17 LAB — GLUCOSE, CAPILLARY
Glucose-Capillary: 109 mg/dL — ABNORMAL HIGH (ref 70–99)
Glucose-Capillary: 84 mg/dL (ref 70–99)

## 2012-06-17 LAB — PHOSPHORUS: Phosphorus: 3.5 mg/dL (ref 2.3–4.6)

## 2012-06-17 LAB — APTT: aPTT: 39 seconds — ABNORMAL HIGH (ref 24–37)

## 2012-06-17 LAB — MAGNESIUM: Magnesium: 2 mg/dL (ref 1.5–2.5)

## 2012-06-17 LAB — VITAMIN E
Alpha-Tocopherol: 11.6 mg/L (ref 5.7–19.9)
Gamma-Tocopherol (Vit E): <0.2 mg/L (ref <=4.3)

## 2012-06-17 LAB — VITAMIN B1: Vitamin B1 (Thiamine): 14 nmol/L (ref 8–30)

## 2012-06-17 MED ORDER — VECURONIUM BROMIDE 10 MG IV SOLR: 10.0000 mg | Freq: Once | INTRAVENOUS | Status: AC

## 2012-06-17 MED ORDER — FUROSEMIDE 10 MG/ML IJ SOLN
20.0000 mg | Freq: Four times a day (QID) | INTRAMUSCULAR | Status: AC
  Administered 2012-06-17 – 2012-06-18 (×3): 20 mg via INTRAVENOUS
  Filled 2012-06-17: qty 2

## 2012-06-17 MED ORDER — ACETAZOLAMIDE SODIUM 500 MG IJ SOLR
250.0000 mg | Freq: Three times a day (TID) | INTRAMUSCULAR | Status: AC
  Administered 2012-06-17 (×2): 250 mg via INTRAVENOUS
  Filled 2012-06-17 (×2): qty 500

## 2012-06-17 MED ORDER — POTASSIUM CHLORIDE 10 MEQ/100ML IV SOLN
INTRAVENOUS | Status: AC
  Filled 2012-06-17: qty 100

## 2012-06-17 MED ORDER — POTASSIUM CHLORIDE 10 MEQ/50ML IV SOLN
10.0000 meq | INTRAVENOUS | Status: AC
  Administered 2012-06-17 (×5): 10 meq via INTRAVENOUS
  Filled 2012-06-17 (×6): qty 50

## 2012-06-17 MED ORDER — FUROSEMIDE 10 MG/ML IJ SOLN
INTRAMUSCULAR | Status: AC
  Filled 2012-06-17: qty 4

## 2012-06-17 MED ORDER — ALBUMIN HUMAN 25 % IV SOLN
12.5000 g | Freq: Four times a day (QID) | INTRAVENOUS | Status: AC
  Administered 2012-06-17 – 2012-06-18 (×4): 12.5 g via INTRAVENOUS
  Filled 2012-06-17 (×4): qty 50

## 2012-06-17 MED ORDER — POTASSIUM CHLORIDE 10 MEQ/100ML IV SOLN
INTRAVENOUS | Status: AC
  Administered 2012-06-17: 10 meq
  Filled 2012-06-17: qty 100

## 2012-06-17 MED ORDER — VITAMIN D3 25 MCG (1000 UNIT) PO TABS
1000.0000 [IU] | ORAL_TABLET | Freq: Every day | ORAL | Status: DC
  Administered 2012-06-17 – 2012-06-20 (×4): 1000 [IU]
  Filled 2012-06-17 (×4): qty 1

## 2012-06-17 MED ORDER — VITAMINS A & D EX OINT
TOPICAL_OINTMENT | CUTANEOUS | Status: AC
  Administered 2012-06-17: 5
  Filled 2012-06-17: qty 5

## 2012-06-17 MED FILL — Vecuronium Bromide For Inj 10 MG: INTRAVENOUS | Qty: 10 | Status: AC

## 2012-06-17 NOTE — Progress Notes (Signed)
PT Cancellation Note  Patient Details Name: MARQUESA RATH MRN: [id number] DOB: 04/11/1961   Cancelled Treatment:  Pt politely refusing hydrotherapy treatment today due to having a procedure today (having a trach placed today). Will follow up tomorrow for wound care.  Sallyanne Kuster 06/17/2012, 9:29 AM  Sallyanne Kuster, PTA Office- [phone]

## 2012-06-17 NOTE — Progress Notes (Signed)
PULMONARY  / CRITICAL CARE MEDICINE  Name: PRUDIE GUTHRIDGE MRN: [id number] DOB: 1960-07-22    LOS: 11  REFERRING MD :  EDP  CHIEF COMPLAINT:  FTT and AMS  BRIEF PATIENT DESCRIPTION: 51 yo with limited mobility due to poorly neuromuscular disease, last seen by Neurologist about 2 years ago, brought to [facility] ED with 3 d h/o altered mental status, and FTT X 3 mo. In ED: hypotensive, hypoglycemic, hypothermic, bradycardic, Hb 3.0, WBC 30. Intubated for AMS. Admitting dx of severe sepsis of unclear source  SIGNIFICANT EVENTS:  12/04 transfused 4 units PRBCs 12/04 Echo: EF 20-25%, diffuse HK 12/04 KUB: There is a single dilated loop of small bowel in the the left mid abdomen 12/05 Abd [place]: Bilateral pleural effusions and moderate ascites. Fatty infiltration of the liver. Biliary sludge without stones or acute changes  LINES / TUBES:  12/3 ETT >> 12/06>>>12/9>>> 12/3 L [place] CVL >>    CULTURES:   12/3 Urine: enterococcus (pansens) 12/3 Respiratory >> serratia 12/2 Blood >> NEG   ANTIBIOTICS:  Zosyn 12/3 >>> 12/4 Vancomycin 12/3 >>> 12/08 Levaquin 12/3 >>>   VITAL SIGNS: Temp:  [97.9 F (36.6 C)-99.8 F (37.7 C)] 98.2 F (36.8 C) (12/13 1000) Pulse Rate:  [79-100] 79  (12/13 1000) Resp:  [15-25] 16  (12/13 1000) BP: (84-120)/(35-59) 118/59 mmHg (12/13 1000) SpO2:  [96 %-100 %] 99 % (12/13 0643) FiO2 (%):  [30 %] 30 % (12/13 0825) Weight:  [39.2 kg (86 lb 6.7 oz)] 39.2 kg (86 lb 6.7 oz) (12/13 0500) HEMODYNAMICS:   VENTILATOR SETTINGS: Vent Mode:  [-] PRVC FiO2 (%):  [30 %] 30 % Set Rate:  [16 bmp] 16 bmp Vt Set:  [450 mL] 450 mL PEEP:  [5 cmH20] 5 cmH20 Pressure Support:  [5 cmH20] 5 cmH20 Plateau Pressure:  [17 cmH20-21 cmH20] 17 cmH20 INTAKE / OUTPUT: Intake/Output      12/12 0701 - 12/13 0700 12/13 0701 - 12/14 0700   I.V. (mL/kg) 822.8 (21) 500 (12.8)   Blood  612.5   NG/GT 720    IV Piggyback 1210    Total Intake(mL/kg) 2752.8 (70.2) 1112.5 (28.4)   Urine  (mL/kg/hr) 5315 (5.6)    Stool 1    Total Output 5316    Net -2563.2 +1112.5          PHYSICAL EXAMINATION: General:  Frail 51 year old female.  Chronically ill, much more alert and interactive this AM. Neuro:  RASS 0, + F/C, diffusely profoundly weak. HEENT:  Temporal wasting.  Cardiovascular:  RRR s M. Lungs:  No wheezes anteriorly, dependent rales. Abdomen:  Firm, + bowel sounds.  Musculoskeletal:  Weak. Skin:  Anasarca much improved. 1-2+ LE edema.  LABS: Cbc  Lab 06/17/12 0500 06/16/12 0510 06/15/12 0530  WBC 6.2 -- --  HGB 7.2* 7.6* 7.5*  HCT 22.1* 23.9* 23.6*  PLT 115* 85* 34*   Chemistry  Lab 06/17/12 0500 06/16/12 0510 06/15/12 0530  NA 137 139 136  K 2.1* 2.9* 2.6*  CL 100 100 99  CO2 31 35* 31  BUN 13 11 11   CREATININE 0.35* 0.34* 0.35*  CALCIUM 8.1* 8.1* 8.0*  MG 2.0 1.6 1.6  PHOS 3.5 2.6 2.5  GLUCOSE 110* 96 100*   Liver fxn  Lab 06/12/12 0635  AST 58*  ALT 47*  ALKPHOS 126*  BILITOT 1.4*  PROT 3.9*  ALBUMIN 1.3*   coags  Lab 06/17/12 0500 06/12/12 0635  APTT 39* 42*  INR 1.37 1.48  Sepsis markers  Lab 06/12/12 4098  LATICACIDVEN --  PROCALCITON 0.48   Cardiac markers No results found for this basename: CKTOTAL:3,CKMB:3,TROPONINI:3 in the last 168 hours BNP  Lab 06/12/12 0635  PROBNP 20971.0*   ABG  Lab 06/17/12 0443 06/16/12 0453 06/15/12 0420  PHART 7.528* 7.530* 7.562*  PCO2ART 36.0 40.8 33.9*  PO2ART 119.0* 115.0* 116.0*  HCO3 29.7* 34.0* 30.5*  TCO2 27.8 31.0 28.3   CBG trend  Lab 06/17/12 0729 06/16/12 1618 06/16/12 0752 06/15/12 1537 06/15/12 0800  GLUCAP 109* 125* 118* 121* 95   PCT: 0.48  IMAGING: No new CXR 12/08  Intake/Output Summary (Last 24 hours) at 06/17/12 1131 Last data filed at 06/17/12 0945  Gross per 24 hour  Intake   3120 ml  Output   5086 ml  Net  -1966 ml   ECG:  IMPRESSION:  PMH of :  Neuromuscular disorder, apparently progressive - not fully defined despite extensive Neurology  eval in past.   MS has been entertained in past and r/o'd  Profound debilitation, bed bound X months  H/O anorexia nervosa  Severe,prolonged malnutrition compromising bodily function  FMS, PTSD, IBS  Presentation 12/03 of:  Acute respiratory failure, now resolving, extubated 12/06  Acute encephalopathy, resolved  Severe anemia of unclear etiology, no evidence of acute blood loss  Severe thrombocytopenia of unclear etiology  Coagulopathy  Suspected Severe sepsis/septic shock  Suspected CAP  Severe sacral decubitus ulcer  Current active problems:  Acute respiratory failure, now resolving  extubated 12/06 and failing due to hypoxic respiratory failure.   Shock circulatory, suspect cardiogenic > septic, resolving  Shock resolved, anticipate BP will drop again post intubation, will rely more on levophed rather than fluid.   Dilated cardiomyopathy, unclear etiology   Suspected CAP, serratia on resp cx  Completing course of Levofloxacin   Severe sacral decubitus pressure ulcer  WOC and CCS following   Enterococcal UTI (pansensitive)   Thrombocytopenia of unclear etiology   No evidence of actvie bleeding  No indication for plts tx   Coagulopathy, mild   No evidence of actvie bleeding.  No indication for blood products.  If have to thora then will need platelet transfusion.   Abnormal LFTs - improving   Anasarca, resolving  DISCUSSION: Suspect that much or most of her illness is attributable to profound malnutrition which is likely the most reversible component of her overall illness. Thrombocytopenia is hard to explain on this basis alone but likely related to chronic malnutrition compounded by sepsis induced bone marrow suppression.  Dilated CM can be a result of severe prolonged malnutrition as well.  This appears much better with milrinone but anasarca remains an issue given low UOP. She is unable to take much nutrition by mouth due to weakness and anorexia. I believe that  we should focus on treating the problems already defined focusing primarily on improving her nutritional status and monitor the less-well defined problems for resolution of the neuromuscular blockade.  Patient decompensated on 12/9 and required reintubation (and was quite a difficult airway).  I do not foresee the patient extubating successfully.  The progressive malnutrition and muscle weakness are unlikely to resolve quickly.  PLAN:  - Trach today and begin PS trials. - Hope to advance to [facility] in AM. - Cont milrinone and levophed infusion. - Wean NE to off for MAP > 55 mmHg. - Continue TFs. - D/C current abx for now (levofloxacin) - completed 10/10 days. - Cont wound care. - Monitor CBC, BMET, LFTs  intermittently. - If a thora is to be done will need platelet transfusion but seems to be improving with diureses alone. - Will give albumin and lasix today again to assist with anasarca, was very effective over the past few days. - Add zaroxolyn. - Additional doses of potassium today. - Patient is much more alert and interactive, dicussed situation with her, wished for full care.  Trach placed on 12/13 will PEG.  CC time 40 min.  Alyson Reedy, M.D. [facility] Pulmonary/Critical Care Medicine. Pager: [phone]. After hours pager: [phone].

## 2012-06-17 NOTE — Procedures (Signed)
Bronchoscopy  for Percutaneous  Tracheostomy  Name: [name] MRN: [id number] DOB: Jun 06, 1961 Procedure: Bronchoscopy for Percutaneous Tracheostomy Indications: Diagnostic evaluation of the airways In conjunction with:Dr. Molli Knock with Brett Canales Minor ACNP as first assist as bronchoscopist.   Procedure Details Consent: Risks of procedure as well as the alternatives and risks of each were explained to the (patient/caregiver).  Consent for procedure obtained. Time Out: Verified patient identification, verified procedure, site/side was marked, verified correct patient position, special equipment/implants available, medications/allergies/relevent history reviewed, required imaging and test results available.  Performed  In preparation for procedure, patient was given 100% FiO2 and bronchoscope lubricated. Sedation: Benzodiazepines and Etomidate  Airway entered and the following bronchi were examined: RUL and LUL.   Procedures performed: Endotracheal Tube retracted in 2 cm increments. Cannulation of airway observed. Dilation observed. Placement of trachel tube  observed . No overt complications. Bronchoscope removed.    Evaluation Hemodynamic Status: BP stable throughout; O2 sats: stable throughout Patient's Current Condition: stable Specimens:  None Complications: No apparent complications Patient did tolerate procedure well.   Brett Canales Minor ACNP [facility] Pager [phone] till 3 pm If no answer page [phone] 06/17/2012, 2:01 PM   Bronchoscopy was performed prior to the tracheostomy, ET tube positioned, then trach done then bronchoscope used again to verify location and hemostasis.  No BAL send.  Alyson Reedy, M.D. [facility] Pulmonary/Critical Care Medicine. Pager: [phone]. After hours pager: [phone].

## 2012-06-17 NOTE — Procedures (Signed)
Bedside Tracheostomy Insertion Procedure Note   Patient Details:   Name: [name] DOB: Oct 28, 1960 MRN: [id number]  Procedure: Tracheostomy  Pre Procedure Assessment: ET Tube Size: ET Tube secured at lip (cm): Bite block in place: NO, d/t MD and NP did not want to insert oral airway d/t pt w/ existing loose teeth, and MD ordering pt to receive paralytic  Breath Sounds: Diminished  Post Procedure Assessment: BP 118/59  Pulse 79  Temp 98.2 F (36.8 C) (Oral)  Resp 16  Ht 5\' 6"  (1.676 m)  Wt 86 lb 6.7 oz (39.2 kg)  BMI 13.95 kg/m2  SpO2 99% O2 sats: stable throughout Complications: No apparent complications Patient did tolerate procedure well Tracheostomy Brand:Shiley Tracheostomy Style:Cuffed Tracheostomy Size: 6.0 Tracheostomy Secured NWG:[id number] and velcro  Tracheostomy Placement Confirmation:Trach cuff visualized and in place  Dr. Molli Knock inserted trach.    Jennette Kettle 06/17/2012, 12:15 PM

## 2012-06-17 NOTE — Progress Notes (Addendum)
Nutrition Brief Note:  Pt following for tolerance of TF- Osmolite 1.2 @ 40 mL/hr which provides 1152 kcal, 53g protein, 787 mL free water. Pt with 1 BM yesterday. Residuals: 0 mL at last check this am.  Pt received trach this am, remains on vent support.    Also following for nutrition-related labs.  RD order Vitamin E, D, and B1 levels be checked.  Vitamin D status low at 10 ng/mL, reference range 30-89 ng/mL. Vitamin B1 status wnl at 14 nmol/L Vitamin E still pending.  CMP     Component Value Date/Time   NA 137 06/17/2012 0500   K 2.1* 06/17/2012 0500   CL 100 06/17/2012 0500   CO2 31 06/17/2012 0500   GLUCOSE 110* 06/17/2012 0500   BUN 13 06/17/2012 0500   CREATININE 0.35* 06/17/2012 0500   CALCIUM 8.1* 06/17/2012 0500   PROT 3.9* 06/12/2012 0635   ALBUMIN 1.3* 06/12/2012 0635   AST 58* 06/12/2012 0635   ALT 47* 06/12/2012 0635   ALKPHOS 126* 06/12/2012 0635   BILITOT 1.4* 06/12/2012 0635   GFRNONAA >90 06/17/2012 0500   GFRAA >90 06/17/2012 0500    CBC    Component Value Date/Time   WBC 6.2 06/17/2012 0500   RBC 2.66* 06/17/2012 0500   HGB 7.2* 06/17/2012 0500   HCT 22.1* 06/17/2012 0500   PLT 115* 06/17/2012 0500   MCV 83.1 06/17/2012 0500   MCH 27.1 06/17/2012 0500   MCHC 32.6 06/17/2012 0500   RDW 25.1* 06/17/2012 0500   LYMPHSABS 0.3* 06/06/2012 2240   MONOABS 0.3 06/06/2012 2240   EOSABS 0.0 06/06/2012 2240   BASOSABS 0.0 06/06/2012 2240    Nutrition Dx:  Inadequate oral intake, ongoing  Intervention:  1.  Enteral nutrition; continue current management.  Pt meeting estimated needs.  Continue PO supplements 2.  Nutrition-related medications; RD discussed administration of Vitamin D with PharmD and MD.  Per recommendations available, will order 1000 units of Vitamin D daily per MD verbal order.  RD to continue to follow for adequacy of nutrition regimen and return of lab work.  Loyce Dys, MS RD LDN Clinical Inpatient Dietitian Pager:  [phone] Weekend/After hours pager: [phone]

## 2012-06-17 NOTE — Procedures (Signed)
Tracheostomy Placement  Consent from patient and husband.  Platelet infused.  Patient sedated with versed, fentanyl, etomidate and vec for paralyzing agents.  Area cleaned, lidocaine injected with epi, skin incision made followed by blunt dissection, airway entered, wire passed after placement of catheter, visualized bronchoscopically, trachea crushed and dilated.  Trach placed.  Visualized bronchscopically and well above the carina.  Minimal blood loss.  CXR stable.  Alyson Reedy, M.D. [facility] Pulmonary/Critical Care Medicine. Pager: [phone]. After hours pager: [phone].

## 2012-06-18 ENCOUNTER — Inpatient Hospital Stay (HOSPITAL_COMMUNITY): Payer: Managed Care, Other (non HMO)

## 2012-06-18 DIAGNOSIS — K7200 Acute and subacute hepatic failure without coma: Secondary | ICD-10-CM

## 2012-06-18 DIAGNOSIS — I428 Other cardiomyopathies: Secondary | ICD-10-CM

## 2012-06-18 LAB — BASIC METABOLIC PANEL
BUN: 13 mg/dL (ref 6–23)
BUN: 13 mg/dL (ref 6–23)
CO2: 29 mEq/L (ref 19–32)
CO2: 30 mEq/L (ref 19–32)
Calcium: 8.7 mg/dL (ref 8.4–10.5)
Calcium: 8.5 mg/dL (ref 8.4–10.5)
Chloride: 100 mEq/L (ref 96–112)
Chloride: 100 mEq/L (ref 96–112)
Creatinine, Ser: 0.32 mg/dL — ABNORMAL LOW (ref 0.50–1.10)
Creatinine, Ser: 0.29 mg/dL — ABNORMAL LOW (ref 0.50–1.10)
GFR calc Af Amer: >90 mL/min (ref >90)
GFR calc Af Amer: >90 mL/min (ref >90)
GFR calc non Af Amer: >90 mL/min (ref >90)
GFR calc non Af Amer: >90 mL/min (ref >90)
Glucose, Bld: 111 mg/dL — ABNORMAL HIGH (ref 70–99)
Glucose, Bld: 128 mg/dL — ABNORMAL HIGH (ref 70–99)
Potassium: 2.3 mEq/L — CL (ref 3.5–5.1)
Potassium: 2.4 mEq/L — CL (ref 3.5–5.1)
Sodium: 137 mEq/L (ref 135–145)
Sodium: 138 mEq/L (ref 135–145)

## 2012-06-18 LAB — MAGNESIUM: Magnesium: 1.9 mg/dL (ref 1.5–2.5)

## 2012-06-18 LAB — CBC
HCT: 22.9 % — ABNORMAL LOW (ref 36.0–46.0)
Hemoglobin: 7.5 g/dL — ABNORMAL LOW (ref 12.0–15.0)
MCH: 27.1 pg (ref 26.0–34.0)
MCHC: 32.8 g/dL (ref 30.0–36.0)
MCV: 82.7 fL (ref 78.0–100.0)
Platelets: 252 10*3/uL (ref 150–400)
RBC: 2.77 MIL/uL — ABNORMAL LOW (ref 3.87–5.11)
RDW: 24.4 % — ABNORMAL HIGH (ref 11.5–15.5)
WBC: 6.7 10*3/uL (ref 4.0–10.5)

## 2012-06-18 LAB — BLOOD GAS, ARTERIAL
Acid-Base Excess: 5.9 mmol/L — ABNORMAL HIGH (ref 0.0–2.0)
Bicarbonate: 28.3 mEq/L — ABNORMAL HIGH (ref 20.0–24.0)
Drawn by: 308601
FIO2: 0.3 %
MECHVT: 450 mL
O2 Saturation: 96.4 %
PEEP: 5 cmH2O
Patient temperature: 98.6
RATE: 16 resp/min
TCO2: 26.6 mmol/L (ref 0–100)
pCO2 arterial: 32.2 mmHg — ABNORMAL LOW (ref 35.0–45.0)
pH, Arterial: 7.553 — ABNORMAL HIGH (ref 7.350–7.450)
pO2, Arterial: 68.5 mmHg — ABNORMAL LOW (ref 80.0–100.0)

## 2012-06-18 LAB — GLUCOSE, CAPILLARY
Glucose-Capillary: 111 mg/dL — ABNORMAL HIGH (ref 70–99)
Glucose-Capillary: 126 mg/dL — ABNORMAL HIGH (ref 70–99)
Glucose-Capillary: 80 mg/dL (ref 70–99)

## 2012-06-18 LAB — PHOSPHORUS: Phosphorus: 2.8 mg/dL (ref 2.3–4.6)

## 2012-06-18 MED ORDER — VITAMINS A & D EX OINT
TOPICAL_OINTMENT | CUTANEOUS | Status: AC
  Administered 2012-06-18: 5
  Filled 2012-06-18: qty 5

## 2012-06-18 MED ORDER — POTASSIUM CHLORIDE 10 MEQ/50ML IV SOLN
INTRAVENOUS | Status: AC
  Filled 2012-06-18: qty 50

## 2012-06-18 MED ORDER — POTASSIUM CHLORIDE 10 MEQ/50ML IV SOLN
INTRAVENOUS | Status: AC
  Administered 2012-06-18: 10 meq
  Filled 2012-06-18: qty 50

## 2012-06-18 MED ORDER — OSMOLITE 1.2 CAL PO LIQD: 1000.0000 mL | ORAL | Status: DC

## 2012-06-18 MED ORDER — POTASSIUM CHLORIDE 10 MEQ/50ML IV SOLN
10.0000 meq | INTRAVENOUS | Status: AC
  Administered 2012-06-18 (×4): 10 meq via INTRAVENOUS
  Filled 2012-06-18: qty 50

## 2012-06-18 NOTE — Progress Notes (Signed)
Physical Therapy Wound Treatment Patient Details  Name: [name] MRN: [id number] Date of Birth: Dec 23, 1960  Today's Date: 06/18/2012 Time: 11:25-12:10pm  Subjective  Subjective: On trach vent, altert and uses hands/mouths words to communicate. Reports 10/10 pain currently in chest from new trach. RN gave IV pain meds and pt agreeable to proceed with hydrotherapy to wounds. Additional IV pain meds given during treatment due to incr pain with pulsed lavage.  Pain Score: Pain Score: 10-Worst pain ever  Wound Assessment  Pressure Ulcer 06/07/12 Unstageable - Full thickness tissue loss in which the base of the ulcer is covered by slough (yellow, tan, gray, green or brown) and/or eschar (tan, brown or black) in the wound bed. Large Round eshcar  (Active)  Site / Wound Assessment Bleeding;Yellow;Dusky;Pink;Brown 06/18/2012 12:10 PM  % Wound base Red or Granulating 50% 06/18/2012 12:10 PM  % Wound base Yellow 20% 06/18/2012 12:10 PM  % Wound base Black 30% 06/18/2012 12:10 PM  Peri-wound Assessment Intact 06/18/2012 12:10 PM  Margins Unattacted edges (unapproximated) 06/18/2012 12:10 PM  Drainage Amount Moderate 06/18/2012 12:10 PM  Drainage Description No odor;Sanguineous;Serosanguineous 06/18/2012 12:10 PM  Treatment Debridement (Selective);Hydrotherapy (Pulse lavage);Packing (Saline gauze) 06/18/2012 12:10 PM  Dressing Type Gauze (Comment);Moist to dry;Foam 06/18/2012 12:10 PM  Dressing Changed;Clean;Dry;Intact 06/18/2012 12:10 PM      Hydrotherapy Pulsed lavage therapy - wound location: right sacral Pulsed Lavage with Suction (psi): 8 psi Pulsed Lavage with Suction - Normal Saline Used: 1000 mL Pulsed Lavage Tip: Tip with splash shield Selective Debridement Selective Debridement - Location: sacrum Selective Debridement - Tools Used: Forceps;Scissors Selective Debridement - Tissue Removed: slough   Wound Assessment and Plan  Wound Therapy -  Assess/Plan/Recommendations Hydrotherapy Plan: Debridement;Dressing change;Pulsatile lavage with suction Wound Therapy - Frequency: 6X / week Wound Therapy - Follow Up Recommendations: Skilled nursing facility Wound Plan: PLS, selective debridement  Wound Therapy Goals- Improve the function of patient's integumentary system by progressing the wound(s) through the phases of wound healing (inflammation - proliferation - remodeling) by: Decrease Necrotic Tissue - Progress: Met Increase Granulation Tissue - Progress: Met Improve Drainage Characteristics - Progress: Progressing toward goal  Goals will be updated until maximal potential achieved or discharge criteria met.  Discharge criteria: when goals achieved, discharge from hospital, MD decision/surgical intervention, no progress towards goals, refusal/missing three consecutive treatments without notification or medical reason.  GP     Sallyanne Kuster 06/18/2012, 12:51 PM  Sallyanne Kuster, PTA Office- [phone]

## 2012-06-18 NOTE — Progress Notes (Signed)
PULMONARY  / CRITICAL CARE MEDICINE  Name: HOLLY IANNACCONE MRN: [id number] DOB: 10-Oct-1960    LOS: 12  REFERRING MD :  EDP  CHIEF COMPLAINT:  FTT and AMS  BRIEF PATIENT DESCRIPTION: 51 yo with limited mobility due to poorly neuromuscular disease, last seen by Neurologist about 2 years ago, brought to [facility] ED with 3 d h/o altered mental status, and FTT X 3 mo. In ED: hypotensive, hypoglycemic, hypothermic, bradycardic, Hb 3.0, WBC 30. Intubated for AMS. Admitting dx of severe sepsis of unclear source  LINES / TUBES:  12/3 ETT >> 12/06>>>12/9>>>06/17/12, TRACH Molli Knock) 12/13.13 >>> 12/3 L [place] CVL >>    CULTURES:   12/3 Urine: enterococcus (pansens) 12/3 Respiratory >> serratia 12/2 Blood >> NEG  Results for orders placed during the hospital encounter of 06/06/12  CULTURE, BLOOD (ROUTINE X 2)     Status: Normal   Collection Time   06/06/12  9:05 PM      Component Value Range Status Comment   Specimen Description BLOOD ARTERIAL   Final    Special Requests BOTTLES DRAWN AEROBIC AND ANAEROBIC 3CC   Final    Culture  Setup Time 06/07/2012 01:44   Final    Culture NO GROWTH 5 DAYS   Final    Report Status 06/13/2012 FINAL   Final   URINE CULTURE     Status: Normal   Collection Time   06/06/12  9:48 PM      Component Value Range Status Comment   Specimen Description URINE, CATHETERIZED   Final    Special Requests NONE   Final    Culture  Setup Time 06/07/2012 03:45   Final    Colony Count >=100,000 COLONIES/ML   Final    Culture ENTEROCOCCUS SPECIES   Final    Report Status 06/09/2012 FINAL   Final    Organism ID, Bacteria ENTEROCOCCUS SPECIES   Final   MRSA PCR SCREENING     Status: Normal   Collection Time   06/07/12  2:37 AM      Component Value Range Status Comment   MRSA by PCR NEGATIVE  NEGATIVE Final   CULTURE, RESPIRATORY     Status: Normal   Collection Time   06/07/12  3:55 AM      Component Value Range Status Comment   Specimen Description TRACHEAL ASPIRATE   Final     Special Requests NONE   Final    Gram Stain     Final    Value: RARE WBC PRESENT, PREDOMINANTLY MONONUCLEAR     RARE SQUAMOUS EPITHELIAL CELLS PRESENT     NO ORGANISMS SEEN   Culture RARE SERRATIA MARCESCENS   Final    Report Status 06/10/2012 FINAL   Final    Organism ID, Bacteria SERRATIA MARCESCENS   Final       ANTIBIOTICS:  Zosyn 12/3 >>> 12/4 Vancomycin 12/3 >>> 12/08 Levaquin 12/3 >>>   Anti-infectives     Start     Dose/Rate Route Frequency Ordered Stop   06/07/12 2200   vancomycin (VANCOCIN) IVPB 1000 mg/200 mL premix  Status:  Discontinued        1,000 mg 200 mL/hr over 60 Minutes Intravenous Daily at bedtime 06/07/12 0041 06/09/12 1110   06/07/12 0600   piperacillin-tazobactam (ZOSYN) IVPB 3.375 g  Status:  Discontinued        3.375 g 12.5 mL/hr over 240 Minutes Intravenous 3 times per day 06/07/12 0039 06/08/12 1256   06/07/12 0045  levofloxacin (LEVAQUIN) IVPB 750 mg        750 mg 100 mL/hr over 90 Minutes Intravenous Daily at bedtime 06/07/12 0038 06/16/12 2230   06/06/12 2115  piperacillin-tazobactam (ZOSYN) IVPB 3.375 g       3.375 g 12.5 mL/hr over 240 Minutes Intravenous  Once 06/06/12 2102 06/07/12 0128   06/06/12 2115   vancomycin (VANCOCIN) IVPB 1000 mg/200 mL premix        1,000 mg 200 mL/hr over 60 Minutes Intravenous  Once 06/06/12 2102 06/06/12 2226          SIGNIFICANT EVENTS:  12/04 transfused 4 units PRBCs 12/04 Echo: EF 20-25%, diffuse HK 12/04 KUB: There is a single dilated loop of small bowel in the the left mid abdomen 12/05 Abd [place]: Bilateral pleural effusions and moderate ascites. Fatty infiltration of the liver. Biliary sludge without stones or acute changes    SUBJECTIVE/OVERNIGHT/INTERVAL HX 06/18/2012: Doing well s/p trach. Denies pain at trach site. Due to start SBT    VITAL SIGNS: Temp:  [98.2 F (36.8 C)-99.8 F (37.7 C)] 98.8 F (37.1 C) (12/14 0800) Pulse Rate:  [79-89] 89  (12/14 0845) Resp:  [16-24] 21   (12/14 0845) BP: (93-118)/(42-59) 95/50 mmHg (12/14 0845) SpO2:  [91 %-100 %] 96 % (12/14 0845) FiO2 (%):  [30 %] 30 % (12/14 0840) HEMODYNAMICS:   VENTILATOR SETTINGS: Vent Mode:  [-] CPAP FiO2 (%):  [30 %] 30 % Set Rate:  [16 bmp] 16 bmp Vt Set:  [450 mL] 450 mL PEEP:  [5 cmH20] 5 cmH20 Pressure Support:  [5 cmH20-15 cmH20] 15 cmH20 Plateau Pressure:  [21 cmH20-23 cmH20] 21 cmH20 INTAKE / OUTPUT: Intake/Output      12/13 0701 - 12/14 0700 12/14 0701 - 12/15 0700   I.V. (mL/kg) 968.8 (24.7) 3.7 (0.1)   Blood 612.5    Other 860    NG/GT 200    IV Piggyback 402 100   Total Intake(mL/kg) 3043.3 (77.6) 103.7 (2.6)   Urine (mL/kg/hr) 4075 (4.3) 330   Stool     Total Output 4075 330   Net -1031.7 -226.3          PHYSICAL EXAMINATION: General:  Frail 51 year old female.  Chronically ill,  Neuro:  RASS 0, + F/C, diffusely profoundly weak. Pleasant HEENT:  Temporal wasting. TRACH +  - site looks clean Cardiovascular:  RRR s M. Lungs:  No wheezes anteriorly, dependent rales. Abdomen:  Firm, + bowel sounds.  Musculoskeletal:  Weak. Skin:  Anasarca much improved. 1-2+ LE edema.  LABS: Cbc  Lab 06/18/12 0430 06/17/12 0500 06/16/12 0510  WBC 6.7 -- --  HGB 7.5* 7.2* 7.6*  HCT 22.9* 22.1* 23.9*  PLT 252 115* 85*   Chemistry  Lab 06/18/12 0800 06/18/12 0430 06/17/12 0500 06/16/12 0510  NA 138 137 137 --  K 2.4* 2.3* 2.1* --  CL 100 100 100 --  CO2 30 29 31  --  BUN 13 13 13  --  CREATININE 0.29* 0.32* 0.35* --  CALCIUM 8.5 8.7 8.1* --  MG -- 1.9 2.0 1.6  PHOS -- 2.8 3.5 2.6  GLUCOSE 128* 111* 110* --   Liver fxn  Lab 06/12/12 0635  AST 58*  ALT 47*  ALKPHOS 126*  BILITOT 1.4*  PROT 3.9*  ALBUMIN 1.3*   coags  Lab 06/17/12 0500 06/12/12 0635  APTT 39* 42*  INR 1.37 1.48   Sepsis markers  Lab 06/12/12 0635  LATICACIDVEN --  PROCALCITON 0.48   Cardiac  markers No results found for this basename: CKTOTAL:3,CKMB:3,TROPONINI:3 in the last 168  hours BNP  Lab 06/12/12 0635  PROBNP 20971.0*   ABG  Lab 06/18/12 0428 06/17/12 0443 06/16/12 0453  PHART 7.553* 7.528* 7.530*  PCO2ART 32.2* 36.0 40.8  PO2ART 68.5* 119.0* 115.0*  HCO3 28.3* 29.7* 34.0*  TCO2 26.6 27.8 31.0   CBG trend  Lab 06/18/12 0819 06/18/12 0049 06/17/12 1656 06/17/12 0729 06/16/12 1618  GLUCAP 126* 111* 84 109* 125*   PCT: 0.48  IMAGING: No new CXR 12/08  Intake/Output Summary (Last 24 hours) at 06/18/12 0932 Last data filed at 06/18/12 1478  Gross per 24 hour  Intake 2939.6 ml  Output   4255 ml  Net -1315.4 ml   ECG:  IMPRESSION:  PMH of :  Neuromuscular disorder, apparently progressive - not fully defined despite extensive Neurology eval in past.   MS has been entertained in past and r/o'd  Profound debilitation, bed bound X months  H/O anorexia nervosa  Severe,prolonged malnutrition compromising bodily function  FMS, PTSD, IBS  Presentation 12/03 of:  Acute respiratory failure, now resolving, extubated 12/06  Acute encephalopathy, resolved  Severe anemia of unclear etiology, no evidence of acute blood loss  Severe thrombocytopenia of unclear etiology  Coagulopathy  Suspected Severe sepsis/septic shock  Suspected CAP  Severe sacral decubitus ulcer  Current active problems:  Acute and Chronic respiratory failure with prolonged mechanical ventilation S/p trach 06/17/12  On 06/18/2012: startng PSV trial. No trach complications     Shock circulatory, suspect cardiogenic > septic, resolved  Shock resolved, anticipate BP will drop again post intubation, will rely more on levophed rather than fluid.  On 06/18/2012: maintaining BP   Dilated cardiomyopathy, unclear etiology  On 06/18/2012: contnue diuresis    Suspected CAP, serratia on resp cx  Completing course of Levofloxacin   Severe sacral decubitus pressure ulcer  WOC and CCS following   Enterococcal UTI (pansensitive)   Thrombocytopenia of unclear etiology   No  evidence of actvie bleeding  No indication for plts tx   Coagulopathy, mild   No evidence of actvie bleeding.  No indication for blood products.  If have to thora then will need platelet transfusion.   Abnormal LFTs - improving   Anasarca, resolving  DISCUSSION: Suspect that much or most of her illness is attributable to profound malnutrition which is likely the most reversible component of her overall illness. Thrombocytopenia is hard to explain on this basis alone but likely related to chronic malnutrition compounded by sepsis induced bone marrow suppression.  Dilated CM can be a result of severe prolonged malnutrition as well.  This appears much better with milrinone but anasarca remains an issue given low UOP. She is unable to take much nutrition by mouth due to weakness and anorexia. I believe that we should focus on treating the problems already defined focusing primarily on improving her nutritional status and monitor the less-well defined problems for resolution of the neuromuscular blockade.  Patient decompensated on 12/9 and required reintubation (and was quite a difficult airway).   The progressive malnutrition and muscle weakness are unlikely to resolve quickly. She is s/p trach 06/18/12 .    Husband updated 06/18/2012   Dr. Kalman Shan, M.D., [facility].C.P Pulmonary and Critical Care Medicine Staff Physician [facility] Pulmonary and Critical Care Pager: [phone], If no answer or between  15:00h - 7:00h: call [phone]  06/18/2012 9:40 AM

## 2012-06-18 NOTE — Progress Notes (Signed)
17:45 Placed pt on ATC .35 tolerating well, SPO2 99%, Hr84 RR 20 RT to monitor

## 2012-06-18 NOTE — Progress Notes (Signed)
eLink Physician-Brief Progress Note Patient Name: [name] DOB: 1961-05-07 MRN: [id number]  Date of Service  06/18/2012   HPI/Events of Note  Hypokalemia in the setting of ongoing diuresis   eICU Interventions  Plan: IV K runs Post k replacement BMET   Intervention Category Major Interventions: Electrolyte abnormality - evaluation and management  DETERDING,Anishka 06/18/2012, 5:35 AM

## 2012-06-18 NOTE — Progress Notes (Signed)
CRITICAL VALUE ALERT  Critical value received:  K=2.3  Date of notification:  06/18/12  Time of notification:  05:33  Critical value read back:yes  Nurse who received alert:  Guinevere Scarlet, RN  MD notified (1st page):  Dr. Darrick Penna  Time of first page:  05:33  MD notified (2nd page):  Time of second page:  Responding MD:  Dr. Darrick Penna   Time MD responded:  05:33

## 2012-06-19 ENCOUNTER — Inpatient Hospital Stay (HOSPITAL_COMMUNITY): Payer: Managed Care, Other (non HMO)

## 2012-06-19 LAB — PROTIME-INR
INR: 1.29 (ref 0.00–1.49)
Prothrombin Time: 15.8 seconds — ABNORMAL HIGH (ref 11.6–15.2)

## 2012-06-19 LAB — CBC
HCT: 21.3 % — ABNORMAL LOW (ref 36.0–46.0)
HCT: 22.4 % — ABNORMAL LOW (ref 36.0–46.0)
HCT: 27.5 % — ABNORMAL LOW (ref 36.0–46.0)
Hemoglobin: 6.8 g/dL — CL (ref 12.0–15.0)
Hemoglobin: 6.9 g/dL — CL (ref 12.0–15.0)
Hemoglobin: 8.8 g/dL — ABNORMAL LOW (ref 12.0–15.0)
MCH: 26.9 pg (ref 26.0–34.0)
MCH: 26.1 pg (ref 26.0–34.0)
MCH: 27 pg (ref 26.0–34.0)
MCHC: 31.9 g/dL (ref 30.0–36.0)
MCHC: 30.8 g/dL (ref 30.0–36.0)
MCHC: 32 g/dL (ref 30.0–36.0)
MCV: 84.2 fL (ref 78.0–100.0)
MCV: 84.8 fL (ref 78.0–100.0)
MCV: 84.4 fL (ref 78.0–100.0)
Platelets: 264 10*3/uL (ref 150–400)
Platelets: 264 10*3/uL (ref 150–400)
Platelets: 275 10*3/uL (ref 150–400)
RBC: 2.53 MIL/uL — ABNORMAL LOW (ref 3.87–5.11)
RBC: 2.64 MIL/uL — ABNORMAL LOW (ref 3.87–5.11)
RBC: 3.26 MIL/uL — ABNORMAL LOW (ref 3.87–5.11)
RDW: 25.1 % — ABNORMAL HIGH (ref 11.5–15.5)
RDW: 24.9 % — ABNORMAL HIGH (ref 11.5–15.5)
RDW: 21.2 % — ABNORMAL HIGH (ref 11.5–15.5)
WBC: 5.2 10*3/uL (ref 4.0–10.5)
WBC: 5.3 10*3/uL (ref 4.0–10.5)
WBC: 4.7 10*3/uL (ref 4.0–10.5)

## 2012-06-19 LAB — PREPARE PLATELET PHERESIS: Unit division: 0

## 2012-06-19 LAB — GLUCOSE, CAPILLARY
Glucose-Capillary: 108 mg/dL — ABNORMAL HIGH (ref 70–99)
Glucose-Capillary: 103 mg/dL — ABNORMAL HIGH (ref 70–99)
Glucose-Capillary: 123 mg/dL — ABNORMAL HIGH (ref 70–99)

## 2012-06-19 LAB — LACTATE DEHYDROGENASE: LDH: 149 U/L (ref 94–250)

## 2012-06-19 LAB — PHOSPHORUS: Phosphorus: 2.7 mg/dL (ref 2.3–4.6)

## 2012-06-19 LAB — APTT: aPTT: 37 seconds (ref 24–37)

## 2012-06-19 LAB — PRO B NATRIURETIC PEPTIDE: Pro B Natriuretic peptide (BNP): 3071 pg/mL — ABNORMAL HIGH (ref 0–125)

## 2012-06-19 LAB — MAGNESIUM: Magnesium: 2 mg/dL (ref 1.5–2.5)

## 2012-06-19 LAB — PREPARE RBC (CROSSMATCH)

## 2012-06-19 LAB — HAPTOGLOBIN: Haptoglobin: 170 mg/dL (ref 45–215)

## 2012-06-19 NOTE — Progress Notes (Signed)
CRITICAL VALUE ALERT  Critical value received:  HgB=6.8  Date of notification:  06/19/12  Time of notification:  05:00  Critical value read back:yes  Nurse who received alert:  Guinevere Scarlet, RN  MD notified (1st page):  Dr. Dorisann Frames  Time of first page:  05:00  MD notified (2nd page):  Time of second page:  Responding MD:  Dr. Dorisann Frames requested to repeat lab to verify.   Time MD responded:  05:00

## 2012-06-19 NOTE — Progress Notes (Signed)
PULMONARY  / CRITICAL CARE MEDICINE  Name: [name] MRN: [id number] DOB: 10/21/1960    LOS: 13  REFERRING MD :  EDP  CHIEF COMPLAINT:  FTT and AMS  BRIEF PATIENT DESCRIPTION: 51 yo with limited mobility due to poorly neuromuscular disease, last seen by Neurologist about 2 years ago, brought to [facility] ED with 3 d h/o altered mental status, and FTT X 3 mo. In ED: hypotensive, hypoglycemic, hypothermic, bradycardic, Hb 3.0, WBC 30. Intubated for AMS. Admitting dx of severe sepsis of unclear source  LINES / TUBES:  12/3 ETT >> 12/06>>>12/9>>>06/17/12, TRACH Molli Knock) 12/13.13 >>> 12/3 L [place] CVL >>    CULTURES:   12/3 Urine: enterococcus (pansens) 12/3 Respiratory >> serratia 12/2 Blood >> NEG FINAL 06/07/12 - MRSA PCR - NEG No results found for this basename: PROCALCITON:5 in the last 168 hours      ANTIBIOTICS:  Zosyn 12/3 >>> 12/4 Vancomycin 12/3 >>> 12/08 Levaquin 12/3 >>> 12/12    SIGNIFICANT EVENTS:  12/04 transfused 4 units PRBCs 12/04 Echo: EF 20-25%, diffuse HK 12/04 KUB: There is a single dilated loop of small bowel in the the left mid abdomen 12/05 Abd [place]: Bilateral pleural effusions and moderate ascites. Fatty infiltration of the liver. Biliary sludge without stones or acute changes 12/13  S/p TRACH - [name] 06/18/12 prbc 1 unit 06/19/12 prbc 1 unit     SUBJECTIVE/OVERNIGHT/INTERVAL HX 06/19/2012: Doing well s/p trach. Denies pain at trach site. Doing Trach collar this aM. Weaned on PSV all day yesteday and rested overnight   VITAL SIGNS: Temp:  [98 F (36.7 C)-99.6 F (37.6 C)] 98.5 F (36.9 C) (12/15 0955) Pulse Rate:  [74-108] 95  (12/15 1000) Resp:  [14-25] 18  (12/15 1000) BP: (80-115)/(35-57) 101/45 mmHg (12/15 1000) SpO2:  [94 %-100 %] 96 % (12/15 1000) FiO2 (%):  [30 %-35 %] 35 % (12/15 1000) HEMODYNAMICS:   VENTILATOR SETTINGS: Vent Mode:  [-] PRVC FiO2 (%):  [30 %-35 %] 35 % Set Rate:  [16 bmp] 16 bmp Vt Set:  [450 mL] 450  mL PEEP:  [5 cmH20] 5 cmH20 Pressure Support:  [10 cmH20] 10 cmH20 Plateau Pressure:  [14 cmH20-18 cmH20] 18 cmH20 INTAKE / OUTPUT: Intake/Output      12/14 0701 - 12/15 0700 12/15 0701 - 12/16 0700   I.V. (mL/kg) 1026.2 (26.2) 167.2 (4.3)   Blood  162   Other  60   NG/GT 1020 160   IV Piggyback 250    Total Intake(mL/kg) 2296.2 (58.6) 549.2 (14)   Urine (mL/kg/hr) 940 (1) 210 (1.2)   Total Output 940 210   Net +1356.2 +339.2          PHYSICAL EXAMINATION: General:  Frail 51 year old female.  Chronic critically ill  Neuro:  RASS 0, + F/C, diffusely profoundly weak. Pleasant HEENT:  Temporal wasting. TRACH +  - site looks clean Cardiovascular:  RRR s M. Lungs:  No wheezes anteriorly, dependent rales. Abdomen:  Firm, + bowel sounds.  Musculoskeletal:  Weak. Skin:  Anasarca much improved. 1-2+ LE edema.  LABS: Cbc  Lab 06/19/12 0510 06/19/12 0415 06/18/12 0430  WBC 5.3 -- --  HGB 6.9* 6.8* 7.5*  HCT 22.4* 21.3* 22.9*  PLT 264 264 252   Chemistry  Lab 06/19/12 0415 06/18/12 0800 06/18/12 0430 06/17/12 0500  NA -- 138 137 137  K -- 2.4* 2.3* 2.1*  CL -- 100 100 100  CO2 -- 30 29 31   BUN -- 13 13  13  CREATININE -- 0.29* 0.32* 0.35*  CALCIUM -- 8.5 8.7 8.1*  MG 2.0 -- 1.9 2.0  PHOS 2.7 -- 2.8 3.5  GLUCOSE -- 128* 111* 110*   Liver fxn No results found for this basename: AST:3,ALT:3,ALKPHOS:3,BILITOT:3,PROT:3,ALBUMIN:3 in the last 168 hours coags  Lab 06/19/12 0700 06/17/12 0500  APTT 37 39*  INR 1.29 1.37   Sepsis markers No results found for this basename: LATICACIDVEN:3,PROCALCITON:3 in the last 168 hours Cardiac markers No results found for this basename: CKTOTAL:3,CKMB:3,TROPONINI:3 in the last 168 hours BNP  Lab 06/19/12 0415  PROBNP 3071.0*   ABG  Lab 06/18/12 0428 06/17/12 0443 06/16/12 0453  PHART 7.553* 7.528* 7.530*  PCO2ART 32.2* 36.0 40.8  PO2ART 68.5* 119.0* 115.0*  HCO3 28.3* 29.7* 34.0*  TCO2 26.6 27.8 31.0   CBG trend  Lab  06/19/12 0810 06/19/12 0016 06/18/12 1620 06/18/12 0819 06/18/12 0049  GLUCAP 123* 108* 103* 126* 111*   PCT: 0.48  IMAGING: No new CXR 12/08  Intake/Output Summary (Last 24 hours) at 06/19/12 1139 Last data filed at 06/19/12 1100  Gross per 24 hour  Intake 2250.55 ml  Output    765 ml  Net 1485.55 ml   ECG:  IMPRESSION:  PMH of :  Neuromuscular disorder, apparently progressive - not fully defined despite extensive Neurology eval in past.   MS has been entertained in past and r/o'd  Profound debilitation, bed bound X months  H/O anorexia nervosa  Severe,prolonged malnutrition compromising bodily function  FMS, PTSD, IBS  Presentation 12/03 of:  Acute respiratory failure, now resolving, extubated 12/06  Acute encephalopathy, resolved  Severe anemia of unclear etiology, no evidence of acute blood loss  Severe thrombocytopenia of unclear etiology  Coagulopathy  Suspected Severe sepsis/septic shock  Suspected CAP  Severe sacral decubitus ulcer  Current active problems:  Acute and Chronic respiratory failure with prolonged mechanical ventilation S/p trach 06/17/12  On 06/19/2012: l. No trach complications. Did well with PSV on 06/18/12. Doinmg trach collar 06/19/12. Ordered PMV. Instructed RT to rest patient on full vent support overnuight     Shock circulatory, suspect cardiogenic > septic, resolved  Shock resolved, anticipate BP will drop again post intubation, will rely more on levophed rather than fluid.  On 06/19/2012: maintaining BP   Dilated cardiomyopathy, unclear etiology  On 06/19/2012: contnue diuresis    Enterococcal UTI and serratia on resp cx. S/p all abx complete 06/16/12  On 06/19/12: No evidence of infection   Severe sacral decubitus pressure ulcer  WOC and CCS following   Thrombocytopenia of unclear etiology. REsolved as of 06/18/12 Anemia of critical illness - s/p 1 unit prbc 06/18/12  On 06/19/12: Hgb < 7gm% and unchagned despite PRBC 06/18/12.  No evidence of actvie bleeding. One unit prbc given 06/19/12. Will check stool occult blood. Trasnfusion trigger is < 7gm%   Coagulopathy, mild - resolved   No evidence of actvie bleeding.  No indication for blood products.     Abnormal LFTs - improving   Anasarca, resolving  DISCUSSION: Suspect that much or most of her illness is attributable to profound malnutrition which is likely the most reversible component of her overall illness. Thrombocytopenia is hard to explain on this basis alone but likely related to chronic malnutrition compounded by sepsis induced bone marrow suppression.  Dilated CM can be a result of severe prolonged malnutrition as well.  This appears much better with milrinone but anasarca remains an issue given low UOP. She is unable to take much nutrition  by mouth due to weakness and anorexia. I believe that we should focus on treating the problems already defined focusing primarily on improving her nutritional status and monitor the less-well defined problems for resolution of the neuromuscular blockade.  Patient decompensated on 12/9 and required reintubation (and was quite a difficult airway).   The progressive malnutrition and muscle weakness are unlikely to resolve quickly. She is s/p trach 06/18/12 .    Husband updated 06/19/2012   Dr. Kalman Shan, M.D., [facility].C.P Pulmonary and Critical Care Medicine Staff Physician [facility] [place] [facility] Pager: [phone], If no answer or between  15:00h - 7:00h: call [phone]  06/19/2012 11:39 AM

## 2012-06-19 NOTE — Progress Notes (Signed)
Anemia Hg 6.8 No evidence of bleeding;   Plan: repeat CBC to verify; prepare and hold 2U of PRBC.

## 2012-06-19 NOTE — Progress Notes (Signed)
Anemia   Transfuse 1 U PRBC  

## 2012-06-20 ENCOUNTER — Inpatient Hospital Stay
Admission: AD | Admit: 2012-06-20 | Discharge: 2012-07-18 | Disposition: A | Discharge status: Home / Self Care | Payer: Managed Care, Other (non HMO) | Source: Ambulatory Visit | Attending: Internal Medicine | Admitting: Internal Medicine

## 2012-06-20 ENCOUNTER — Institutional Professional Consult (permissible substitution) (HOSPITAL_COMMUNITY): Payer: Managed Care, Other (non HMO)

## 2012-06-20 ENCOUNTER — Other Ambulatory Visit (HOSPITAL_COMMUNITY): Payer: Managed Care, Other (non HMO)

## 2012-06-20 DIAGNOSIS — G709 Myoneural disorder, unspecified: Secondary | ICD-10-CM

## 2012-06-20 DIAGNOSIS — R5381 Other malaise: Secondary | ICD-10-CM

## 2012-06-20 DIAGNOSIS — J9620 Acute and chronic respiratory failure, unspecified whether with hypoxia or hypercapnia: Secondary | ICD-10-CM

## 2012-06-20 DIAGNOSIS — Z930 Tracheostomy status: Secondary | ICD-10-CM

## 2012-06-20 DIAGNOSIS — E46 Unspecified protein-calorie malnutrition: Secondary | ICD-10-CM

## 2012-06-20 HISTORY — DX: Anemia, unspecified: D64.9

## 2012-06-20 HISTORY — DX: Acute and subacute hepatic failure without coma: K72.00

## 2012-06-20 HISTORY — DX: Pneumonia, unspecified organism: J18.9

## 2012-06-20 HISTORY — DX: Cardiomyopathy, unspecified: I42.9

## 2012-06-20 HISTORY — DX: Anorexia nervosa, unspecified: F50.00

## 2012-06-20 HISTORY — DX: Pressure ulcer of sacral region, unspecified stage: L89.159

## 2012-06-20 HISTORY — DX: Shock, unspecified: R57.9

## 2012-06-20 LAB — BASIC METABOLIC PANEL
BUN: 22 mg/dL (ref 6–23)
CO2: 28 mEq/L (ref 19–32)
Calcium: 8.3 mg/dL — ABNORMAL LOW (ref 8.4–10.5)
Chloride: 106 mEq/L (ref 96–112)
Creatinine, Ser: 0.25 mg/dL — ABNORMAL LOW (ref 0.50–1.10)
GFR calc Af Amer: >90 mL/min (ref >90)
GFR calc non Af Amer: >90 mL/min (ref >90)
Glucose, Bld: 117 mg/dL — ABNORMAL HIGH (ref 70–99)
Potassium: 3.3 mEq/L — ABNORMAL LOW (ref 3.5–5.1)
Sodium: 140 mEq/L (ref 135–145)

## 2012-06-20 LAB — CBC
HCT: 26.3 % — ABNORMAL LOW (ref 36.0–46.0)
Hemoglobin: 8.5 g/dL — ABNORMAL LOW (ref 12.0–15.0)
MCH: 27.4 pg (ref 26.0–34.0)
MCHC: 32.3 g/dL (ref 30.0–36.0)
MCV: 84.8 fL (ref 78.0–100.0)
Platelets: 277 10*3/uL (ref 150–400)
RBC: 3.1 MIL/uL — ABNORMAL LOW (ref 3.87–5.11)
RDW: 22 % — ABNORMAL HIGH (ref 11.5–15.5)
WBC: 3.8 10*3/uL — ABNORMAL LOW (ref 4.0–10.5)

## 2012-06-20 LAB — GLUCOSE, CAPILLARY
Glucose-Capillary: 103 mg/dL — ABNORMAL HIGH (ref 70–99)
Glucose-Capillary: 88 mg/dL (ref 70–99)
Glucose-Capillary: 96 mg/dL (ref 70–99)
Glucose-Capillary: 98 mg/dL (ref 70–99)

## 2012-06-20 LAB — PHOSPHORUS: Phosphorus: 3 mg/dL (ref 2.3–4.6)

## 2012-06-20 LAB — ERYTHROPOIETIN: Erythropoietin: 76.4 m[IU]/mL — ABNORMAL HIGH (ref 2.6–34.0)

## 2012-06-20 LAB — MAGNESIUM: Magnesium: 2 mg/dL (ref 1.5–2.5)

## 2012-06-20 LAB — PRO B NATRIURETIC PEPTIDE: Pro B Natriuretic peptide (BNP): 2389 pg/mL — ABNORMAL HIGH (ref 0–125)

## 2012-06-20 MED ORDER — POTASSIUM CHLORIDE 20 MEQ/15ML (10%) PO LIQD
40.0000 meq | Freq: Once | ORAL | Status: AC
  Administered 2012-06-20: 40 meq
  Filled 2012-06-20: qty 30

## 2012-06-20 MED ORDER — BIOTENE DRY MOUTH MT LIQD: 1.0000 "application " | Freq: Four times a day (QID) | OROMUCOSAL | Status: DC

## 2012-06-20 MED ORDER — COLLAGENASE 250 UNIT/GM EX OINT: TOPICAL_OINTMENT | Freq: Every day | CUTANEOUS | Status: DC

## 2012-06-20 MED ORDER — CHLORHEXIDINE GLUCONATE 0.12 % MT SOLN: 15.0000 mL | Freq: Two times a day (BID) | OROMUCOSAL | Status: DC

## 2012-06-20 MED ORDER — SILVER SULFADIAZINE 1 % EX CREA: TOPICAL_CREAM | Freq: Every day | CUTANEOUS | Status: DC

## 2012-06-20 MED ORDER — VITAMIN D3 25 MCG (1000 UNIT) PO TABS: 1000.0000 [IU] | ORAL_TABLET | Freq: Every day | ORAL | Status: DC

## 2012-06-20 MED ORDER — ADULT MULTIVITAMIN LIQUID CH: 5.0000 mL | Freq: Every day | ORAL | Status: DC

## 2012-06-20 MED ORDER — FENTANYL CITRATE 0.05 MG/ML IJ SOLN: 50.0000 ug | INTRAMUSCULAR | Status: DC | PRN

## 2012-06-20 MED ORDER — ZOLPIDEM TARTRATE 5 MG PO TABS: 5.0000 mg | ORAL_TABLET | Freq: Every evening | ORAL | Status: DC | PRN

## 2012-06-20 MED ORDER — OSMOLITE 1.2 CAL PO LIQD: 1000.0000 mL | ORAL | Status: DC

## 2012-06-20 MED ORDER — PANTOPRAZOLE SODIUM 40 MG PO PACK: 40.0000 mg | PACK | Freq: Every day | ORAL | Status: DC

## 2012-06-20 NOTE — Progress Notes (Signed)
Report called to Luisa Hart, RN. Report called to [facility] transport team.  Patient transferring via [facility] to [facility], room 5731. Spouse is at the bedside.  Patient is alert and oriented.

## 2012-06-20 NOTE — Progress Notes (Signed)
Nutrition Follow-up  Intervention:   1.  Enteral nutrition; continue current regimen of Osmolite 1.2 @ 40 mL/hr continuous providing 1152 kcal, 53g protein, 787 mL free water. Will monitor for PO advance and adequacy.  Recommend continuing TFs until pt with improved nutrition and wt status or pt able to consume 100% estimated needs PO. 2.  Supplements; continue current regimen of additional Vitamin D supplementation.  Thiamine found to be wnl.  Additional Vitamin E and B1 provided by MVI and TF. Vitamin E most likely provided in dl-alpha-tocopheryl acetate form.  No additional supplementation at this time unless otherwise specified by MD.  Assessment:   Pt admitted for AMS x3 days PTA. Pt with limited mobility r/t possible MS, several ulcers requiring bedside debridement, and extremely poor nutrition status with h/o anorexia nervosa.  Trach in place. Pt currently on Osmolite 1.2 @ 40 ml/hr continuous with good tolerance. Last BM 12/16 Residuals: 0 mL  Note wt status with improved fluid status is now 96 lbs.  Wt has been as low as 86-90 lbs via bed scale recently.  Pt requesting swallow eval which has been order by MD.    Skin: Stage III wound, unstageable wound, skin tear to sacrum  Diet Order:  NPO  Meds: Scheduled Meds:   . antiseptic oral rinse  1 application Mouth Rinse QID  . chlorhexidine  15 mL Mouth/Throat BID  . cholecalciferol  1,000 Units Per Tube Daily  . collagenase   Topical Daily  . collagenase   Topical Daily  . multivitamin  5 mL Oral Daily  . pantoprazole sodium  40 mg Per Tube Daily  . silver sulfADIAZINE   Topical Daily   Continuous Infusions:   . sodium chloride 40 mL/hr at 06/19/12 0100  . feeding supplement (OSMOLITE 1.2 CAL)     PRN Meds:.fentaNYL, zolpidem   CMP     Component Value Date/Time   NA 140 06/20/2012 0400   K 3.3* 06/20/2012 0400   CL 106 06/20/2012 0400   CO2 28 06/20/2012 0400   GLUCOSE 117* 06/20/2012 0400   BUN 22 06/20/2012 0400    CREATININE 0.25* 06/20/2012 0400   CALCIUM 8.3* 06/20/2012 0400   PROT 3.9* 06/12/2012 0635   ALBUMIN 1.3* 06/12/2012 0635   AST 58* 06/12/2012 0635   ALT 47* 06/12/2012 0635   ALKPHOS 126* 06/12/2012 0635   BILITOT 1.4* 06/12/2012 0635   GFRNONAA >90 06/20/2012 0400   GFRAA >90 06/20/2012 0400    CBG (last 3)   Basename 06/20/12 0752 06/19/12 2348 06/19/12 1615  GLUCAP 103* 88 98     Intake/Output Summary (Last 24 hours) at 06/20/12 1150 Last data filed at 06/20/12 0600  Gross per 24 hour  Intake 1654.2 ml  Output    479 ml  Net 1175.2 ml    Re-estimated needs:  1213 kcal, 56-65g protein  Nutrition Dx:  Inadequate oral intake, ongoing   Monitor:  1. Enteral nutrition; transition to standard formula. Advancement to goal rate for pt to meet >/=90% of estimated needs. Met, ongoing. 2. Gastrointestinal; monitor stool output.  Met, ongoing.  Loyce Dys, MS RD LDN Clinical Inpatient Dietitian Pager: [phone] Weekend/After hours pager: [phone]

## 2012-06-20 NOTE — Progress Notes (Signed)
Continued support with pt.  Pt requested prayers with chaplain.

## 2012-06-20 NOTE — Discharge Summary (Signed)
Physician Discharge Summary  Patient ID: [name] MRN: [id number] DOB/AGE: 06-Oct-1960 51 y.o.  Admit date: 06/06/2012 Discharge date: 06/20/2012    Discharge Diagnoses:  Acute respiratory failure CAP (community acquired pneumonia) vs aspiration pneumonia  Acute encephalopathy, resolved Neuromuscular disorder: not fully defined. Anemia Thrombocytopenia Coagulopathy Acute liver failure Enterococcal UTI (pansensitive) Shock circulatory, suspect primarily cardiogenic Sacral Pressure Ulcer Dilated cardiomyopathy Malnutrition compromising bodily function H/O anorexia nervosa Dysphagia   Brief Summary: [name] is a 51 y.o. y/o female with a PMH of limited mobility due to advanced neuromuscular disease (poorly defined), last seen by Neurology about 2 years ago, brought to [facility] ED with 3 d h/o altered mental status, and FTT X 3 mo. In ED: hypotensive, hypoglycemic, hypothermic, bradycardic, Hb 3.0, WBC 30. Intubated for AMS. Work up demonstrated enterococcal UTI and serratia PNA.  Ultimately was unable to be weaned from mechanical ventilation daytime after tracheostomy placement.  Hospital course complicated by anemia, dilated cardiomyopathy of unclear etiology (required milrinone), severe malnutrition and sacral decubitus.  Transfer to [facility] for further Rehab efforts 12/16.     Consults:  LINES / TUBES:  12/3 ETT >> 12/06>>>12/9>>>06/17/12, TRACH Molli Knock) 12/13.13 >>>  12/3 L [place] CVL >> 12/16   CULTURES:  12/3 Urine: enterococcus (pansens)  12/3 Respiratory >> serratia  12/2 Blood >> Neg 12/3 MRSA PCR - NEG   ANTIBIOTICS:  Zosyn 12/3 >>> 12/4  Vancomycin 12/3 >>> 12/08  Levaquin 12/3 >>> 12/12   SIGNIFICANT EVENTS:  12/4 -  transfused 4 units PRBCs  12/4 -  Echo: EF 20-25%, diffuse HK  12/4 - KUB: There is a single dilated loop of small bowel in the the left mid abdomen  12/5 - Abd [place]: Bilateral pleural effusions and moderate ascites. Fatty  infiltration of the liver. Biliary sludge without stones or acute changes  12/13 S/p TRACH - [name]  12/14 - prbc 1 unit  12/15 - prbc 1 unit                                                                      Hospital Summary by Discharge Diagnosis    Acute respiratory failure CAP (community acquired pneumonia) vs aspiration pneumonia  Acute respiratory failure in setting of serratia PNA.  Required placement of tracheostomy due to failed attempts at extubation.   Has completed abx for PNA.    Discharge Plan: -ATC during day time -Full vent support QHS -continue PMV trials with SLP -f/u cxr PRN   Acute encephalopathy, resolved Neuromuscular disorder: not fully defined. Profound Debilitation History of neuromuscular disorder with progressive wasting but not fully defined despite extensive Neurological evaluation in past & again this admit. MS has been entertained but ruled out.  Has been bed bound for previous 10 months prior to this admit. Suspect that much or most of her illness is attributable to profound malnutrition which seems to be the only reversible component of her overall illness.   Discharge Plan: -PT / OT as able -nutrition support as below -consider PSY eval for depression  Anemia Thrombocytopenia Coagulopathy Acute liver failure Noted profound anemia / thrombocytopenia on admit in setting of severe malnutrition, acute critical illness.  Required multiple transfusions during admit.  H/H now stable with no  acute bleeding.  Last PRBC 12/15.  Resolved coagulopathy.   Discharge Plan: -monitor H/H -Trasnfusion trigger is < 7gm%    Enterococcal UTI (pansensitive) Shock circulatory, suspect primarily cardiogenic Sacral Pressure Ulcer Enterococcal UTI and Serratia PNA with associated septic / cardiogenic shock in setting of profound malnutrition.   Shock resolved.   Severe Sacral ulceration.    Discharge Plan: -continue wound care with santyl per WOC -abx  complete 12/12 -monitor off abx for now -re-culture IF new fever or leukocytosis   Dilated cardiomyopathy Dilated cardiomyopathy of unclear etiology.  Required milrinone during acute phase of illness.  Diuresed for negative balance.   Discharge Plan: -continue diuresis as renal function / BP permit -consider cardiology follow up post discharge  Malnutrition compromising bodily function H/O anorexia nervosa Dysphagia Evaluated by SLP 12/16 and failed PMV trial due to inability to move air around the trach to exhale, no phonation with PMV in place.  SLP notes when PMSV was removed, a puff of air excaped from the trach, indicative of breath stacking/ CO2 retention.  Discharge Plan: -continue tube feedings per NGT -likely will need PEG but patient has not been able to commit to the decision as of yet -hold further swallow evaluation until patient is able to tolerate PMV -continue nutrition consultation / support -continue current regimen of Osmolite 1.2 @ 40 mL/hr continuous providing 1152 kcal, 53g protein, 787 mL free water.    Discharge Exam: General: Frail 51 year old female. Chronic critically ill  Neuro: RASS 0, + F/C, diffusely profoundly weak. Pleasant  HEENT: Temporal wasting. TRACH + - site looks clean  Cardiovascular: RRR s M.  Lungs: No wheezes anteriorly, dependent rales.  Abdomen: Firm, + bowel sounds.  Musculoskeletal: Weak.  Skin: Anasarca much improved. 1-2+ LE edema.      Filed Vitals:   06/20/12 1000 06/20/12 1200 06/20/12 1400 06/20/12 1600  BP: 97/58 102/54 102/55 97/50  Pulse: 88 88 89 90  Temp:  98.6 F (37 C)  99.5 F (37.5 C)  TempSrc:  Oral  Oral  Resp: 21 22 19 21   Height:      Weight:      SpO2: 97% 99% 99% 100%     Discharge Labs  BMET  Lab 06/20/12 0400 06/19/12 0415 06/18/12 0800 06/18/12 0430 06/17/12 0500 06/16/12 0510  NA 140 -- 138 137 137 139  K 3.3* -- 2.4* -- -- --  CL 106 -- 100 100 100 100  CO2 28 -- 30 29 31  35*   GLUCOSE 117* -- 128* 111* 110* 96  BUN 22 -- 13 13 13 11   CREATININE 0.25* -- 0.29* 0.32* 0.35* 0.34*  CALCIUM 8.3* -- 8.5 8.7 8.1* 8.1*  MG 2.0 2.0 -- 1.9 2.0 1.6  PHOS 3.0 2.7 -- 2.8 3.5 2.6     CBC   Lab 06/20/12 0400 06/19/12 1400 06/19/12 0510  HGB 8.5* 8.8* 6.9*  HCT 26.3* 27.5* 22.4*  WBC 3.8* 4.7 5.3  PLT 277 275 264   Anti-Coagulation  Lab 06/19/12 0700 06/17/12 0500  INR 1.29 1.37      Discharge Orders    Future Orders Please Complete By Expires   Diet - low sodium heart healthy      Increase activity slowly      Discharge wound care:      Comments:   Apply Santyl to   Discharge instructions      Comments:   Apply Santyl to wounds daily per wound care.  Medication List     As of 06/20/2012  5:31 PM    START taking these medications         antiseptic oral rinse Liqd   15 mLs by Mouth Rinse route QID.      chlorhexidine 0.12 % solution   Commonly known as: PERIDEX   Use as directed 15 mLs in the mouth or throat 2 (two) times daily.      cholecalciferol 1000 UNITS tablet   Commonly known as: VITAMIN D   Place 1 tablet (1,000 Units total) into feeding tube daily.      * collagenase ointment   Commonly known as: SANTYL   Apply topically daily.      * collagenase ointment   Commonly known as: SANTYL   Apply topically daily.      feeding supplement (OSMOLITE 1.2 CAL) Liqd   Place 1,000 mLs into feeding tube continuous.      fentaNYL 0.05 MG/ML injection   Commonly known as: SUBLIMAZE   Inject 1-2 mLs (50-100 mcg total) into the vein every 2 (two) hours as needed for severe pain.      multivitamin Liqd   Take 5 mLs by mouth daily.      pantoprazole sodium 40 mg/20 mL Pack   Commonly known as: PROTONIX   Place 20 mLs (40 mg total) into feeding tube daily.      silver sulfADIAZINE 1 % cream   Commonly known as: SILVADENE   Apply topically daily.      zolpidem 5 MG tablet   Commonly known as: AMBIEN   Take 1 tablet (5 mg  total) by mouth at bedtime as needed for sleep.     * Notice: This list has 2 medication(s) that are the same as other medications prescribed for you. Read the directions carefully, and ask your doctor or other care provider to review them with you.        Where to get your medications    These are the prescriptions that you need to pick up.   You may get these medications from any pharmacy.         fentaNYL 0.05 MG/ML injection   zolpidem 5 MG tablet         Information on where to get these meds is not yet available. Ask your nurse or doctor.         antiseptic oral rinse Liqd   chlorhexidine 0.12 % solution   cholecalciferol 1000 UNITS tablet   collagenase ointment   feeding supplement (OSMOLITE 1.2 CAL) Liqd   multivitamin Liqd   pantoprazole sodium 40 mg/20 mL Pack   silver sulfADIAZINE 1 % cream              Disposition: Discharge to [facility].    Discharged Condition: [name] has met maximum benefit of inpatient care and is medically stable and cleared for discharge.  Patient is pending follow up as above.      Time spent on disposition:  Greater than 35 minutes.   Signed: Canary Brim, NP-C [place] [facility] Pgr: [phone]  Cyril Mourning MD

## 2012-06-20 NOTE — Progress Notes (Signed)
Pt has decided to go to [facility] this pm.  Son has been at the hospital for tour, and patient was given a virtual tour. After much information and family prayer time the patient has decided to go.  As room at 5731.  TCT-Dr. Frederico Hamman at [facility] for decision concerning who will do the dc summary.  Dr. Frederico Hamman is trying to see if DR. Vassie Loll can retrun or go in system and complete.

## 2012-06-20 NOTE — Progress Notes (Signed)
PULMONARY  / CRITICAL CARE MEDICINE  Name: [name] MRN: [id number] DOB: 05/08/1961    LOS: 14  REFERRING MD :  EDP  CHIEF COMPLAINT:  FTT and AMS  BRIEF PATIENT DESCRIPTION: 51 yo with limited mobility due to advanced neuromuscular disease, last seen by Neurologist about 2 years ago, brought to [facility] ED with 3 d h/o altered mental status, and FTT X 3 mo. In ED: hypotensive, hypoglycemic, hypothermic, bradycardic, Hb 3.0, WBC 30. Intubated for AMS. Admitting dx of severe sepsis of unclear source  LINES / TUBES:  12/3 ETT >> 12/06>>>12/9>>>06/17/12, TRACH Molli Knock) 12/13.13 >>> 12/3 L [place] CVL >> 12/16   CULTURES:   12/3 Urine: enterococcus (pansens) 12/3 Respiratory >> serratia 12/2 Blood >> NEG FINAL 06/07/12 - MRSA PCR - NEG   ANTIBIOTICS:  Zosyn 12/3 >>> 12/4 Vancomycin 12/3 >>> 12/08 Levaquin 12/3 >>> 12/12    SIGNIFICANT EVENTS:  12/04 transfused 4 units PRBCs 12/04 Echo: EF 20-25%, diffuse HK 12/04 KUB: There is a single dilated loop of small bowel in the the left mid abdomen 12/05 Abd [place]: Bilateral pleural effusions and moderate ascites. Fatty infiltration of the liver. Biliary sludge without stones or acute changes 12/13  S/p TRACH - [name] 06/18/12 prbc 1 unit 06/19/12 prbc 1 unit     SUBJECTIVE/OVERNIGHT/INTERVAL HX  Doing Trach collar daytime since 12/14,rested overnight Denies pain   VITAL SIGNS: Temp:  [97.1 F (36.2 C)-99.3 F (37.4 C)] 99.3 F (37.4 C) (12/16 0800) Pulse Rate:  [70-103] 103  (12/16 0900) Resp:  [16-22] 22  (12/16 0900) BP: (83-105)/(33-62) 103/56 mmHg (12/16 0800) SpO2:  [95 %-100 %] 96 % (12/16 0900) FiO2 (%):  [28 %-30 %] 28 % (12/16 0823) Weight:  [43.6 kg (96 lb 1.9 oz)] 43.6 kg (96 lb 1.9 oz) (12/16 0350) HEMODYNAMICS:   VENTILATOR SETTINGS: Vent Mode:  [-] PRVC FiO2 (%):  [28 %-30 %] 28 % Set Rate:  [16 bmp] 16 bmp Vt Set:  [450 mL] 450 mL PEEP:  [5 cmH20] 5 cmH20 Plateau Pressure:  [17 cmH20-18 cmH20] 17  cmH20 INTAKE / OUTPUT: Intake/Output      12/15 0701 - 12/16 0700 12/16 0701 - 12/17 0700   I.V. (mL/kg) 961.4 (22.1)    Blood 362    Other 110    NG/GT 920    IV Piggyback     Total Intake(mL/kg) 2353.4 (54)    Urine (mL/kg/hr) 688 (0.7)    Stool 1    Total Output 689    Net +1664.4           PHYSICAL EXAMINATION: General:  Frail 51 year old female.  Chronic critically ill  Neuro:  RASS 0, + F/C, diffusely profoundly weak. Pleasant HEENT:  Temporal wasting. TRACH +  - site looks clean Cardiovascular:  RRR s M. Lungs:  No wheezes anteriorly, dependent rales. Abdomen:  Firm, + bowel sounds.  Musculoskeletal:  Weak. Skin:  Anasarca much improved. 1-2+ LE edema.  LABS: Cbc  Lab 06/20/12 0400 06/19/12 1400 06/19/12 0510  WBC 3.8* -- --  HGB 8.5* 8.8* 6.9*  HCT 26.3* 27.5* 22.4*  PLT 277 275 264   Chemistry  Lab 06/20/12 0400 06/19/12 0415 06/18/12 0800 06/18/12 0430  NA 140 -- 138 137  K 3.3* -- 2.4* 2.3*  CL 106 -- 100 100  CO2 28 -- 30 29  BUN 22 -- 13 13  CREATININE 0.25* -- 0.29* 0.32*  CALCIUM 8.3* -- 8.5 8.7  MG 2.0 2.0 --  1.9  PHOS 3.0 2.7 -- 2.8  GLUCOSE 117* -- 128* 111*   Liver fxn No results found for this basename: AST:3,ALT:3,ALKPHOS:3,BILITOT:3,PROT:3,ALBUMIN:3 in the last 168 hours coags  Lab 06/19/12 0700 06/17/12 0500  APTT 37 39*  INR 1.29 1.37   Sepsis markers No results found for this basename: LATICACIDVEN:3,PROCALCITON:3 in the last 168 hours Cardiac markers No results found for this basename: CKTOTAL:3,CKMB:3,TROPONINI:3 in the last 168 hours BNP  Lab 06/20/12 0400 06/19/12 0415  PROBNP 2389.0* 3071.0*   ABG  Lab 06/18/12 0428 06/17/12 0443 06/16/12 0453  PHART 7.553* 7.528* 7.530*  PCO2ART 32.2* 36.0 40.8  PO2ART 68.5* 119.0* 115.0*  HCO3 28.3* 29.7* 34.0*  TCO2 26.6 27.8 31.0   CBG trend  Lab 06/20/12 0752 06/19/12 2348 06/19/12 1615 06/19/12 0810 06/19/12 0016  GLUCAP 103* 88 98 123* 108*   PCT: 0.48  IMAGING: No  new CXR 12/08  Intake/Output Summary (Last 24 hours) at 06/20/12 1024 Last data filed at 06/20/12 0600  Gross per 24 hour  Intake   1886 ml  Output    479 ml  Net   1407 ml    IMPRESSION:  PMH of :  Neuromuscular disorder, apparently progressive with wasting - not fully defined despite extensive Neurology eval in past.   MS has been entertained in past and r/o'd  Profound debilitation, bed bound X months  H/O anorexia nervosa  Severe,prolonged malnutrition compromising bodily function  FMS, PTSD, IBS  Presentation 12/03 of:  Acute respiratory failure, now resolving, extubated 12/06  Acute encephalopathy, resolved  Severe anemia of unclear etiology, no evidence of acute blood loss  Severe thrombocytopenia of unclear etiology  Coagulopathy  Suspected Severe sepsis/septic shock  Suspected CAP  Severe sacral decubitus ulcer  Current active problems:  Acute and Chronic respiratory failure with prolonged mechanical ventilation  S/p trach 06/17/12   Doing trach collar daytime since 12/14 Ordered PMV.              Rest patient on full vent support overnuight     Shock circulatory, suspect cardiogenic > septic, resolved  Dilated cardiomyopathy, unclear etiology  contnue diuresis             Dc milrinone    Enterococcal UTI and serratia on resp cx. S/p all abx complete 06/16/12  Severe sacral decubitus pressure ulcer  WOC and CCS following   Thrombocytopenia of unclear etiology. REsolved as of 06/18/12 Anemia of critical illness - s/p 1 unit prbc 06/18/12   No evidence of actvie bleeding. One unit prbc given 06/19/12.  Trasnfusion trigger is < 7gm%   Coagulopathy, mild - resolved   No evidence of actvie bleeding.  No indication for blood products.     Abnormal LFTs - improving  Profound malnutrition - swallow evaln but will likely need PEG   Anasarca, resolving  DISCUSSION: Suspect that much or most of her illness is attributable to profound malnutrition which is  likely the most reversible component of her overall illness. Thrombocytopenia is hard to explain on this basis alone but likely related to chronic malnutrition compounded by sepsis induced bone marrow suppression.  Dilated CM can be a result of severe prolonged malnutrition as well.  This appears much better with milrinone but anasarca remains an issue given low UOP. She is unable to take much nutrition by mouth due to weakness and anorexia. I believe that we should focus on treating the problems already defined focusing primarily on improving her nutritional status and monitor the less-well  defined problems for resolution of the neuromuscular blockade.   The progressive malnutrition and muscle weakness are unlikely to resolve quickly. She is s/p trach 06/18/12. Plan for lTAC    Husband updated 06/19/2012  Cyril Mourning MD. [facility]. [place] [facility] Pager [phone] If no response call [phone]  06/20/2012 10:24 AM

## 2012-06-20 NOTE — Evaluation (Signed)
Speech Language Pathology Evaluation Patient Details Name: [name] MRN: [id number] DOB: 24-May-1961 Today's Date: 06/20/2012 Time: 5621-3086 SLP Time Calculation (min): 23 min  Problem List:  Patient Active Problem List  Diagnosis  . Acute encephalopathy, resolved  . Acute respiratory failure  . CAP (community acquired pneumonia) vs aspiration pneumonia   . Anemia  . Thrombocytopenia  . Coagulopathy  . Acute liver failure  . Neuromuscular disorder: not fully defined.   . Enterococcal UTI (pansensitive)  . Shock circulatory, suspect primarily cardiogenic  . Dilated cardiomyopathy  . Malnutrition compromising bodily function  . H/O anorexia nervosa   Past Medical History:  Past Medical History  Diagnosis Date  . MS (multiple sclerosis)   . IBS (irritable bowel syndrome)   . PTSD (post-traumatic stress disorder)   . Fibromyalgia syndrome   . TMJ disease   . Difficult intubation     secondary to jaw muscles  . Fibromyalgia   . GERD (gastroesophageal reflux disease)    Past Surgical History:  Past Surgical History  Procedure Date  . Cesarean section   . Bunionectomy   . Dilation and curettage of uterus 2007  . Tubal ligation    HPI:  51 yo with limited mobility due to advanced neuromuscular disease, last seen by Neurologist about 2 years ago, brought to [facility] ED with 3 d h/o altered mental status, and FTT X 3 mo. In ED: hypotensive, hypoglycemic, hypothermic, bradycardic, Hb 3.0, WBC 30. Intubated for AMS. Admitting dx of severe sepsis of unclear source   Assessment / Plan / Recommendation Clinical Impression  Patient was suctioned by RN prior to PMSV trial.  Patient tolerated cuff deflation without changes in VS's.  PMSV was placed for less than one minute.  Patient was unable to move air around the trach to exhale.  Patient could not push to blow, and obviously, was unable to phonate with PMSV in place.  Sats decreased from 100% to 97, and RR began to increase  slightly.  When PMSV was removed, a puff of air excaped from the trach, indicative of breath stacking/ CO2 retention.  Discussed with Respiratory therapist, RN, and PA, poor results with initial trial of PMSV, and need for continued NPO with TF's at this point.  Defer Swallow evaluation for now.  Prefer that PMSV be tolerated prior to PO's, but may proceed with MBS without PMSV if MD desires. Patient would likely benefit from ongoing T.F.'s due to malnutrition.  SLP Assessment  Patient needs continued Speech Lanaguage Pathology Services    Follow Up Recommendations  LTACH    Frequency and Duration min 2x/week  2 weeks   Pertinent Vitals/Pain N/A   SLP Goals  SLP Goals Potential to Achieve Goals: Fair Potential Considerations: Co-morbidities;Severity of impairments (Size of trach and extremely small body habitus) Progress/Goals/Alternative treatment plan discussed with pt/caregiver and they: Agree;No caregivers available SLP Goal #1: Patient will cooperate with brief trials of PMSV to reassess ability to exhale with PMSV in place.   SLP Evaluation Prior Functioning      Cognition       Comprehension       Expression     Oral / Motor Motor Speech Intelligibility: Unable to assess (comment)   GO     Maryjo Rochester T 06/20/2012, 2:47 PM

## 2012-06-20 NOTE — Progress Notes (Signed)
[id number] Earlene Plater, RN, BSN, CCM: CHART REVIEWED AND UPDATED.  Next chart review due on [id number]. Referral for ltac placement done awaiting decision. CASE MANAGEMENT [phone]

## 2012-06-20 NOTE — Progress Notes (Signed)
PT HYDROTHERAPY NOTE   06/20/12 1100  Subjective Assessment  Subjective Pt now with trach, on T-collar and tol well  Evaluation and Treatment  Evaluation and Treatment Procedures agreed to  Pressure Ulcer 06/07/12 Unstageable - Full thickness tissue loss in which the base of the ulcer is covered by slough (yellow, tan, gray, green or brown) and/or eschar (tan, brown or black) in the wound bed. Large Round eshcar   Date First Assessed/Time First Assessed: 06/07/12 0300   Location: Buttocks  Location Orientation: Right  Staging: Unstageable - Full thickness tissue loss in which the base of the ulcer is covered by slough (yellow, tan, gray, green or brown) and/or esc  State of Healing Early/partial granulation  Site / Wound Assessment Yellow;Red;Brown  % Wound base Red or Granulating 60%  % Wound base Yellow 40% (and brown slough)  Peri-wound Assessment Intact  Margins Unattacted edges (unapproximated)  Drainage Amount Moderate  Drainage Description Serosanguineous;No odor  Treatment Debridement (Selective);Hydrotherapy (Pulse lavage);Packing (Saline gauze)  Dressing Type Adhesive bandage;Gauze (Comment);Foam (Santyl)  Dressing Changed;Clean;Dry;Intact  Hydrotherapy  Pulsed Lavage with Suction (psi) 8 psi  Pulsed Lavage with Suction - Normal Saline Used 1000 mL  Pulsed Lavage Tip Tip with splash shield  Pulsed lavage therapy - wound location right sacral  Selective Debridement  Selective Debridement - Location sacrum  Selective Debridement - Tools Used Forceps;Scissors  Selective Debridement - Tissue Removed slough  Wound Therapy - Assess/Plan/Recommendations  Wound Therapy - Clinical Statement pt wound progressing well  Wound Therapy - Functional Problem List non amb prior to adm; now with siginificant generalized weakness, has not been OOB since hospitaization  Factors Delaying/Impairing Wound Healing Infection - systemic/local;Multiple medical problems;Immobility  Hydrotherapy Plan  Debridement;Dressing change;Patient/family education;Pulsatile lavage with suction  Wound Therapy - Frequency 6X / week  Wound Therapy - Current Recommendations OT;PT  Wound Therapy - Follow Up Recommendations Skilled nursing facility  Wound Plan PLS, selective debridement  Wound Therapy Goals - Improve the function of patient's integumentary system by progressing the wound(s) through the phases of wound healing by:  Decrease Necrotic Tissue to 30  Decrease Necrotic Tissue - Progress Updated due to goal met  Increase Granulation Tissue to 70  Increase Granulation Tissue - Progress Updated due to goal met  Improve Drainage Characteristics Min  Improve Drainage Characteristics - Progress Progressing toward goal  Patient/Family will be able to  position pt. in bed to decrease pressure.

## 2012-06-21 ENCOUNTER — Other Ambulatory Visit (HOSPITAL_COMMUNITY): Payer: Managed Care, Other (non HMO)

## 2012-06-21 LAB — BASIC METABOLIC PANEL
BUN: 21 mg/dL (ref 6–23)
CO2: 26 mEq/L (ref 19–32)
Calcium: 8.6 mg/dL (ref 8.4–10.5)
Chloride: 107 mEq/L (ref 96–112)
Creatinine, Ser: 0.24 mg/dL — ABNORMAL LOW (ref 0.50–1.10)
GFR calc Af Amer: >90 mL/min (ref >90)
GFR calc non Af Amer: >90 mL/min (ref >90)
Glucose, Bld: 87 mg/dL (ref 70–99)
Potassium: 3.6 mEq/L (ref 3.5–5.1)
Sodium: 142 mEq/L (ref 135–145)

## 2012-06-21 LAB — CBC
HCT: 28.3 % — ABNORMAL LOW (ref 36.0–46.0)
Hemoglobin: 8.9 g/dL — ABNORMAL LOW (ref 12.0–15.0)
MCH: 26.9 pg (ref 26.0–34.0)
MCHC: 31.4 g/dL (ref 30.0–36.0)
MCV: 85.5 fL (ref 78.0–100.0)
Platelets: 333 10*3/uL (ref 150–400)
RBC: 3.31 MIL/uL — ABNORMAL LOW (ref 3.87–5.11)
RDW: 21.7 % — ABNORMAL HIGH (ref 11.5–15.5)
WBC: 3.3 10*3/uL — ABNORMAL LOW (ref 4.0–10.5)

## 2012-06-21 LAB — PREALBUMIN: Prealbumin: 11.4 mg/dL — ABNORMAL LOW (ref 17.0–34.0)

## 2012-06-21 LAB — PHOSPHORUS: Phosphorus: 3.1 mg/dL (ref 2.3–4.6)

## 2012-06-21 LAB — MAGNESIUM: Magnesium: 1.9 mg/dL (ref 1.5–2.5)

## 2012-06-21 NOTE — Progress Notes (Signed)
Subjective: 51 y/o female who was transferred from [facility] yesterday to [facility].  We were re-consulted for continued evaluation of her decubitus ulcers.  The patient is not able to talk, but able to follow commands and give a thumbs up.  Pt denies any pain from the wounds.  Objective: Vital signs in last 24 hours: Temp:  [99.5 F (37.5 C)] 99.5 F (37.5 C) (12/16 1600) Pulse Rate:  [83-90] 83  (12/16 1800) Resp:  [19-21] 19  (12/16 1800) BP: (97-102)/(50-55) 100/55 mmHg (12/16 1800) SpO2:  [99 %-100 %] 99 % (12/16 1800) FiO2 (%):  [28 %] 28 % (12/16 1800)     PE: Gen:  Alert, NAD, pleasant Wounds:  5 decubitus wounds on buttock, various stages of healing, mostly viable pink healthy tissue on smaller locations, larger decub over left with yellowish slough around the edges.  No purulent drainage or foul smell, but larger dressing is saturated.  Lab Results:   Basename 06/21/12 0428 06/20/12 0400  WBC 3.3* 3.8*  HGB 8.9* 8.5*  HCT 28.3* 26.3*  PLT 333 277   BMET  Basename 06/21/12 0428 06/20/12 0400  NA 142 140  K 3.6 3.3*  CL 107 106  CO2 26 28  GLUCOSE 87 117*  BUN 21 22  CREATININE 0.24* 0.25*  CALCIUM 8.6 8.3*   PT/INR  Basename 06/19/12 0700  LABPROT 15.8*  INR 1.29   CMP     Component Value Date/Time   NA 142 06/21/2012 0428   K 3.6 06/21/2012 0428   CL 107 06/21/2012 0428   CO2 26 06/21/2012 0428   GLUCOSE 87 06/21/2012 0428   BUN 21 06/21/2012 0428   CREATININE 0.24* 06/21/2012 0428   CALCIUM 8.6 06/21/2012 0428   PROT 3.9* 06/12/2012 0635   ALBUMIN 1.3* 06/12/2012 0635   AST 58* 06/12/2012 0635   ALT 47* 06/12/2012 0635   ALKPHOS 126* 06/12/2012 0635   BILITOT 1.4* 06/12/2012 0635   GFRNONAA >90 06/21/2012 0428   GFRAA >90 06/21/2012 0428   Lipase  No results found for this basename: lipase       Studies/Results: Dg Chest Port 1 View  06/21/2012  *RADIOLOGY REPORT*  Clinical Data: Respiratory failure, weakness  PORTABLE CHEST - 1 VIEW   Comparison: 06/19/2012; 06/18/2012; 06/13/2012; 06/06/2012  Findings: Grossly unchanged cardiac silhouette and mediastinal contours.  Interval removal of left subclavian vein approach central venous catheter.  Otherwise, stable positioning of remaining support apparatus. No pneumothorax.  Grossly unchanged left basilar/retrocardiac consolidative opacities.  Hazy opacities overlying the bilateral lower lungs are unchanged and favored to represent overlying soft tissue.  No new focal airspace opacities.  Unchanged bones.  IMPRESSION: 1.  Interval removal of left subclavian vein approach central venous catheter.  Otherwise, stable positioning of remaining support apparatus.  No pneumothorax. 2.  Grossly unchanged left basilar/retrocardiac consolidative opacities, atelectasis versus infiltrate.   Original Report Authenticated By: Tacey Ruiz, MD    Dg Abd Portable 1v  06/20/2012  *RADIOLOGY REPORT*  Clinical Data: Verify NG tube placement  PORTABLE ABDOMEN - 1 VIEW  Comparison: 06/08/2012  Findings: Enteric tube terminates in the gastric body.  Mildly prominent loops of small and large bowel, possibly reflecting adynamic ileus.  Patchy left perihilar/upper lobe opacity, better evaluated on chest radiographs.  IMPRESSION: Enteric tube terminates in the gastric body.  Possible adynamic ileus.   Original Report Authenticated By: Charline Bills, M.D.      Assessment/Plan Multiple Decubitus Ulcers 1.  Will plan on monitoring  the patient at least 2x/week for wound checks 2.  Discussed with [facility] nurse regarding patients wounds, she will evaluate today and determine therapy (hydrotherapy, maggot, vs other treatments 3.  No surgical intervention at this time, [facility] nurse can call regarding concerns 4.  Would recommend podiatry evaluate her wound on right lower leg as seen necessary by wound care RN    LOS: 1 day    Aris Georgia 06/21/2012, 12:32 PM Pager: [phone]

## 2012-06-22 ENCOUNTER — Encounter: Payer: Self-pay | Admitting: Physician Assistant

## 2012-06-22 LAB — SEDIMENTATION RATE: Sed Rate: 8 mm/hr (ref 0–22)

## 2012-06-22 LAB — TYPE AND SCREEN
ABO/RH(D): A POS
Antibody Screen: NEGATIVE
Unit division: 0
Unit division: 0

## 2012-06-22 NOTE — H&P (Signed)
[name] is an 50 y.o. female.   Chief Complaint: severe dysphagia. HPI:  Brief Summary: [name] is a 51 y.o. y/o female with a PMH of limited mobility due to advanced neuromuscular disease (poorly defined), last seen by Neurology about 2 years ago, brought to [facility] ED with 3 d h/o altered mental status, and FTT X 3 mo. In ED: hypotensive, hypoglycemic, hypothermic, bradycardic, Hb 3.0, WBC 30. Intubated for AMS. Work up demonstrated enterococcal UTI and serratia PNA. Ultimately was unable to be weaned from mechanical ventilation daytime after tracheostomy placement. Hospital course complicated by anemia, dilated cardiomyopathy of unclear etiology (required milrinone), severe malnutrition and sacral decubitus. Transfer to [facility] for further Rehab efforts 12/16.    Past Medical History  Diagnosis Date  . MS (multiple sclerosis)   . IBS (irritable bowel syndrome)   . PTSD (post-traumatic stress disorder)   . Fibromyalgia syndrome   . TMJ disease   . Difficult intubation     secondary to jaw muscles  . Fibromyalgia   . GERD (gastroesophageal reflux disease)   . Anorexia nervosa     h/o   . CAP (community acquired pneumonia)   . Anemia   . Shock circulatory   . Acute liver failure   . Cardiomyopathy   . Sacral decubitus ulcer     Past Surgical History  Procedure Date  . Cesarean section   . Bunionectomy   . Dilation and curettage of uterus 2007  . Tubal ligation   . Tracheostomy 06/2012   Social History:  reports that she has never smoked. She has never used smokeless tobacco. She reports that she does not drink alcohol or use illicit drugs.  Allergies:  Allergies  Allergen Reactions  . Scopolamine Anaphylaxis and Shortness Of Breath    Respiratory distress  . Ivp Dye (Iodinated Diagnostic Agents) Other (See Comments)    unknown    Medications Prior to Admission  Medication Sig Dispense Refill  . antiseptic oral rinse (BIOTENE) LIQD 15 mLs by  Mouth Rinse route QID.      Marland Kitchen chlorhexidine (PERIDEX) 0.12 % solution Use as directed 15 mLs in the mouth or throat 2 (two) times daily.  120 mL    . cholecalciferol (VITAMIN D) 1000 UNITS tablet Place 1 tablet (1,000 Units total) into feeding tube daily.      . collagenase (SANTYL) ointment Apply topically daily.  15 g    . collagenase (SANTYL) ointment Apply topically daily.  15 g    . fentaNYL (SUBLIMAZE) 0.05 MG/ML injection Inject 1-2 mLs (50-100 mcg total) into the vein every 2 (two) hours as needed for severe pain.  2 mL    . Multiple Vitamin (MULTIVITAMIN) LIQD Take 5 mLs by mouth daily.      . Nutritional Supplements (FEEDING SUPPLEMENT, OSMOLITE 1.2 CAL,) LIQD Place 1,000 mLs into feeding tube continuous.      . pantoprazole sodium (PROTONIX) 40 mg/20 mL PACK Place 20 mLs (40 mg total) into feeding tube daily.  30 each    . silver sulfADIAZINE (SILVADENE) 1 % cream Apply topically daily.  50 g    . zolpidem (AMBIEN) 5 MG tablet Take 1 tablet (5 mg total) by mouth at bedtime as needed for sleep.  30 tablet      Results for orders placed during the hospital encounter of 06/20/12 (from the past 48 hour(s))  CBC     Status: Abnormal   Collection Time   06/21/12  4:28 AM  Component Value Range Comment   WBC 3.3 (*) 4.0 - 10.5 K/uL    RBC 3.31 (*) 3.87 - 5.11 MIL/uL    Hemoglobin 8.9 (*) 12.0 - 15.0 g/dL    HCT 11.9 (*) 14.7 - 46.0 %    MCV 85.5  78.0 - 100.0 fL    MCH 26.9  26.0 - 34.0 pg    MCHC 31.4  30.0 - 36.0 g/dL    RDW 82.9 (*) 56.2 - 15.5 %    Platelets 333  150 - 400 K/uL   BASIC METABOLIC PANEL     Status: Abnormal   Collection Time   06/21/12  4:28 AM      Component Value Range Comment   Sodium 142  135 - 145 mEq/L    Potassium 3.6  3.5 - 5.1 mEq/L    Chloride 107  96 - 112 mEq/L    CO2 26  19 - 32 mEq/L    Glucose, Bld 87  70 - 99 mg/dL    BUN 21  6 - 23 mg/dL    Creatinine, Ser 1.30 (*) 0.50 - 1.10 mg/dL    Calcium 8.6  8.4 - 86.5 mg/dL    GFR calc non Af  Amer >90  >90 mL/min    GFR calc Af Amer >90  >90 mL/min   MAGNESIUM     Status: Normal   Collection Time   06/21/12  4:28 AM      Component Value Range Comment   Magnesium 1.9  1.5 - 2.5 mg/dL   PREALBUMIN     Status: Abnormal   Collection Time   06/21/12  4:28 AM      Component Value Range Comment   Prealbumin 11.4 (*) 17.0 - 34.0 mg/dL   PHOSPHORUS     Status: Normal   Collection Time   06/21/12  4:28 AM      Component Value Range Comment   Phosphorus 3.1  2.3 - 4.6 mg/dL   SEDIMENTATION RATE     Status: Normal   Collection Time   06/22/12  7:10 AM      Component Value Range Comment   Sed Rate 8  0 - 22 mm/hr    Results for [name], [name] (MRN [id number]) as of 06/22/2012 15:29  Ref. Range 06/19/2012 07:00  Prothrombin Time Latest Range: 11.6-15.2 seconds 15.8 (H)  INR Latest Range: 0.00-1.49  1.29     Dg Chest Port 1 View  06/21/2012  *RADIOLOGY REPORT*  Clinical Data: Respiratory failure, weakness  PORTABLE CHEST - 1 VIEW  Comparison: 06/19/2012; 06/18/2012; 06/13/2012; 06/06/2012  Findings: Grossly unchanged cardiac silhouette and mediastinal contours.  Interval removal of left subclavian vein approach central venous catheter.  Otherwise, stable positioning of remaining support apparatus. No pneumothorax.  Grossly unchanged left basilar/retrocardiac consolidative opacities.  Hazy opacities overlying the bilateral lower lungs are unchanged and favored to represent overlying soft tissue.  No new focal airspace opacities.  Unchanged bones.  IMPRESSION: 1.  Interval removal of left subclavian vein approach central venous catheter.  Otherwise, stable positioning of remaining support apparatus.  No pneumothorax. 2.  Grossly unchanged left basilar/retrocardiac consolidative opacities, atelectasis versus infiltrate.   Original Report Authenticated By: Tacey Ruiz, MD    Dg Abd Portable 1v  06/20/2012  *RADIOLOGY REPORT*  Clinical Data: Verify NG tube placement  PORTABLE  ABDOMEN - 1 VIEW  Comparison: 06/08/2012  Findings: Enteric tube terminates in the gastric body.  Mildly prominent loops of small and large  bowel, possibly reflecting adynamic ileus.  Patchy left perihilar/upper lobe opacity, better evaluated on chest radiographs.  IMPRESSION: Enteric tube terminates in the gastric body.  Possible adynamic ileus.   Original Report Authenticated By: Charline Bills, M.D.     ROS Denies recent CP, increased SOB, palpitations.    Afebrile, 98/64, HR: 64, trach in place with mild ecchymosis around site with petechia   Physical Exam  Thin, frail appearing female who gives thumbs up and writes questions appropriately. Trach in place with mild ecchymosis and petechia. Lungs are CTA with fair air exchange. CV: RRR, Abdomen : soft, thin abdomen with positive bowel sounds.  NGT in place currently infusing without difficulty.   Assessment/Plan Discussed with patient, spouse and other family members details of gastric tube placement, it's benefits and potential risks including but not limited to infection, bleeding, poorly healing gastric tube secondary to malnutrition and complications with moderate sedation. All their questions answered to their satisfaction. Labs to be repeated in am to ensure H/H and platelets stable. If so, plan for gastric tube on 12/19.    CAMPBELL,PAMELA D 06/22/2012, 3:36 PM

## 2012-06-23 ENCOUNTER — Other Ambulatory Visit (HOSPITAL_COMMUNITY): Payer: Managed Care, Other (non HMO)

## 2012-06-23 ENCOUNTER — Institutional Professional Consult (permissible substitution) (HOSPITAL_COMMUNITY): Payer: Managed Care, Other (non HMO)

## 2012-06-23 LAB — CBC
HCT: 31.5 % — ABNORMAL LOW (ref 36.0–46.0)
Hemoglobin: 10 g/dL — ABNORMAL LOW (ref 12.0–15.0)
MCH: 27.9 pg (ref 26.0–34.0)
MCHC: 31.7 g/dL (ref 30.0–36.0)
MCV: 87.7 fL (ref 78.0–100.0)
Platelets: 400 10*3/uL (ref 150–400)
RBC: 3.59 MIL/uL — ABNORMAL LOW (ref 3.87–5.11)
RDW: 20.7 % — ABNORMAL HIGH (ref 11.5–15.5)
WBC: 4 10*3/uL (ref 4.0–10.5)

## 2012-06-23 LAB — BASIC METABOLIC PANEL
BUN: 16 mg/dL (ref 6–23)
CO2: 31 mEq/L (ref 19–32)
Calcium: 8.7 mg/dL (ref 8.4–10.5)
Chloride: 103 mEq/L (ref 96–112)
Creatinine, Ser: 0.26 mg/dL — ABNORMAL LOW (ref 0.50–1.10)
GFR calc Af Amer: >90 mL/min (ref >90)
GFR calc non Af Amer: >90 mL/min (ref >90)
Glucose, Bld: 53 mg/dL — ABNORMAL LOW (ref 70–99)
Potassium: 3.9 mEq/L (ref 3.5–5.1)
Sodium: 140 mEq/L (ref 135–145)

## 2012-06-23 MED ORDER — DIPHENHYDRAMINE HCL 50 MG/ML IJ SOLN: 50.0000 mg | Freq: Once | INTRAMUSCULAR | Status: DC

## 2012-06-23 MED ORDER — MIDAZOLAM HCL 2 MG/2ML IJ SOLN
INTRAMUSCULAR | Status: AC | PRN
  Administered 2012-06-23 (×2): 1 mg via INTRAVENOUS

## 2012-06-23 MED ORDER — FENTANYL CITRATE 0.05 MG/ML IJ SOLN
INTRAMUSCULAR | Status: AC | PRN
  Administered 2012-06-23 (×2): 50 ug via INTRAVENOUS

## 2012-06-23 MED ORDER — IOHEXOL 300 MG/ML  SOLN
50.0000 mL | Freq: Once | INTRAMUSCULAR | Status: AC | PRN
  Administered 2012-06-23: 10 mL

## 2012-06-23 MED ORDER — GLUCAGON HCL (RDNA) 1 MG IJ SOLR
INTRAMUSCULAR | Status: AC
  Administered 2012-06-23: 1 mg via INTRAVENOUS
  Filled 2012-06-23: qty 1

## 2012-06-23 MED ORDER — DIPHENHYDRAMINE HCL 50 MG/ML IJ SOLN
INTRAMUSCULAR | Status: AC
  Administered 2012-06-23: 50 mg
  Filled 2012-06-23: qty 1

## 2012-06-23 NOTE — Procedures (Signed)
21F gastrostomy tube placed No complication No blood loss. See complete dictation in [facility].

## 2012-06-23 NOTE — Progress Notes (Signed)
Chaplain responded to page from [phone] that pt requested to see a chaplain. Nurse informed me that pt has an undiagnosed neuromuscular illness and that pt has made herself a DNR against family's wishes. Pt is age 51 with a husband, son, and daughter. Pt cannot speak but can write what she wants to say. Just before entering pt's room I received a page to ED. I introduced myself to pt, explained the situation, and asked if I or another chaplain could see her tomorrow. She smiled broadly and seemed fine with that. I took pt's hand and spoke words of blessing that she sleep well.

## 2012-06-24 ENCOUNTER — Other Ambulatory Visit (HOSPITAL_COMMUNITY): Payer: Managed Care, Other (non HMO)

## 2012-06-24 MED ORDER — GADOBENATE DIMEGLUMINE 529 MG/ML IV SOLN
9.0000 mL | Freq: Once | INTRAVENOUS | Status: AC
  Administered 2012-06-24: 9 mL via INTRAVENOUS

## 2012-06-24 NOTE — Progress Notes (Signed)
Patient ID: [name], female   DOB: 04/23/1961, 51 y.o.   MRN: [id number]    Subjective: Pt wo complaints of pain.  Objective: Vital signs in last 24 hours: Pulse Rate:  [74-101] 74  (12/19 1645) Resp:  [10-20] 10  (12/19 1645) BP: (95-118)/(53-68) 118/68 mmHg (12/19 1645) SpO2:  [100 %] 100 % (12/19 1645)     PE: Wounds:  5 decubitus wounds on buttock, some slough in wounds but over all the appear healthy similar to what it looked at [facility] Lab Results:   [facility] 06/23/12 0620  WBC 4.0  HGB 10.0*  HCT 31.5*  PLT 400   BMET  Basename 06/23/12 0620  NA 140  K 3.9  CL 103  CO2 31  GLUCOSE 53*  BUN 16  CREATININE 0.26*  CALCIUM 8.7   PT/INR No results found for this basename: LABPROT:2,INR:2 in the last 72 hours CMP     Component Value Date/Time   NA 140 06/23/2012 0620   K 3.9 06/23/2012 0620   CL 103 06/23/2012 0620   CO2 31 06/23/2012 0620   GLUCOSE 53* 06/23/2012 0620   BUN 16 06/23/2012 0620   CREATININE 0.26* 06/23/2012 0620   CALCIUM 8.7 06/23/2012 0620   PROT 3.9* 06/12/2012 0635   ALBUMIN 1.3* 06/12/2012 0635   AST 58* 06/12/2012 0635   ALT 47* 06/12/2012 0635   ALKPHOS 126* 06/12/2012 0635   BILITOT 1.4* 06/12/2012 0635   GFRNONAA >90 06/23/2012 0620   GFRAA >90 06/23/2012 0620   Lipase  No results found for this basename: lipase       Studies/Results: Ir Gastrostomy Tube Mod Sed  06/23/2012  *RADIOLOGY REPORT*  Clinical Data:Neuromuscular disorder, needs enteral feeding support.  PERC PLACEMENT GASTROSTOMY  Technique: The procedure, risks, benefits, and alternatives were explained to the patient.  Questions regarding the procedure were encouraged and answered.  The patient understands and consents to the procedure.  As antibiotic prophylaxis, cefazolin 1 gram was ordered pre- procedure and administered intravenously within one hour of incision.  Progression of previously administered oral barium into the colon was confirmed fluoroscopically.  A 5 French angiographic catheter was placed as orogastric tube. The upper abdomen was prepped with Betadine, draped in usual sterile fashion, and infiltrated locally with 1% lidocaine.  Intravenous Fentanyl and Versed were administered as conscious sedation during continuous cardiorespiratory monitoring by the radiology RN, with a total moderate sedation time of 40 minutes.  Stomach was insufflated using air through the orogastric tube. An 40 French sheath needle was advanced percutaneously into the gastric lumen under fluoroscopy. Gas could be aspirated and a small contrast injection confirmed intraluminal spread. The sheath was exchanged over a guidewire for a 9 [place] vascular sheath, through which the snare device was advanced and used to snare a guidewire passed through the orogastric tube. This was withdrawn, and the snare attached to the 20 French pull-through gastrostomy tube, which was advanced antegrade, positioned with the internal bumper securing the anterior gastric wall to the anterior abdominal wall. Small contrast injection confirms appropriate positioning. The external bumper was applied and the catheter was flushed. No immediate complication.  IMPRESSION: 1. Technically successful 20 French pull-through gastrostomy placement under fluoroscopy.   Original Report Authenticated By: D. Andria Rhein, MD    Dg Abd Portable 1v  06/23/2012  *RADIOLOGY REPORT*  Clinical Data: Assess position of barium, weakness, back pain  PORTABLE ABDOMEN - 1 VIEW  Comparison: 06/20/2012  Findings: Tip of nasogastric tube in stomach. Barium  is present at the distal ileum through the proximal to midportions of the ascending colon. Transverse colon is not yet opacified. Few dilated air filled loops of small bowel are present. Bones diffusely demineralized. Left lower lobe atelectasis versus consolidation. Small bibasilar effusions.  IMPRESSION: Barium from the distal ileum through the ascending colon but has not yet  reached the transverse colon. Persistent mild small bowel dilatation.   Original Report Authenticated By: Ulyses Southward, M.D.      Assessment/Plan Multiple Decubitus Ulcers 1.  Wound therapies per [facility] RN recommendations 2.  No surgical intervention at this time, [facility] nurse can call regarding concerns     LOS: 4 days    [name], [name] 06/24/2012, 10:07 AM

## 2012-06-25 LAB — MAGNESIUM: Magnesium: 2.1 mg/dL (ref 1.5–2.5)

## 2012-06-25 LAB — POTASSIUM: Potassium: 3.4 mEq/L — ABNORMAL LOW (ref 3.5–5.1)

## 2012-06-25 LAB — SEDIMENTATION RATE: Sed Rate: 2 mm/hr (ref 0–22)

## 2012-06-25 LAB — PROCALCITONIN: Procalcitonin: 0.1 ng/mL

## 2012-06-26 LAB — POTASSIUM: Potassium: 3.3 mEq/L — ABNORMAL LOW (ref 3.5–5.1)

## 2012-06-27 ENCOUNTER — Institutional Professional Consult (permissible substitution) (HOSPITAL_COMMUNITY): Payer: Managed Care, Other (non HMO)

## 2012-06-27 LAB — CBC
HCT: 33.9 % — ABNORMAL LOW (ref 36.0–46.0)
Hemoglobin: 10.2 g/dL — ABNORMAL LOW (ref 12.0–15.0)
MCH: 27.4 pg (ref 26.0–34.0)
MCHC: 30.1 g/dL (ref 30.0–36.0)
MCV: 91.1 fL (ref 78.0–100.0)
Platelets: 503 10*3/uL — ABNORMAL HIGH (ref 150–400)
RBC: 3.72 MIL/uL — ABNORMAL LOW (ref 3.87–5.11)
RDW: 20.2 % — ABNORMAL HIGH (ref 11.5–15.5)
WBC: 3.9 10*3/uL — ABNORMAL LOW (ref 4.0–10.5)

## 2012-06-27 LAB — BASIC METABOLIC PANEL
BUN: 7 mg/dL (ref 6–23)
CO2: 34 mEq/L — ABNORMAL HIGH (ref 19–32)
Calcium: 8.7 mg/dL (ref 8.4–10.5)
Chloride: 100 mEq/L (ref 96–112)
Creatinine, Ser: 0.23 mg/dL — ABNORMAL LOW (ref 0.50–1.10)
GFR calc Af Amer: >90 mL/min (ref >90)
GFR calc non Af Amer: >90 mL/min (ref >90)
Glucose, Bld: 93 mg/dL (ref 70–99)
Potassium: 4 mEq/L (ref 3.5–5.1)
Sodium: 138 mEq/L (ref 135–145)

## 2012-06-28 ENCOUNTER — Other Ambulatory Visit (HOSPITAL_COMMUNITY): Payer: Managed Care, Other (non HMO)

## 2012-06-29 LAB — CULTURE, RESPIRATORY W GRAM STAIN

## 2012-06-30 ENCOUNTER — Other Ambulatory Visit (HOSPITAL_COMMUNITY): Payer: Managed Care, Other (non HMO)

## 2012-06-30 LAB — CBC
HCT: 31 % — ABNORMAL LOW (ref 36.0–46.0)
Hemoglobin: 9.7 g/dL — ABNORMAL LOW (ref 12.0–15.0)
MCH: 28.4 pg (ref 26.0–34.0)
MCHC: 31.3 g/dL (ref 30.0–36.0)
MCV: 90.6 fL (ref 78.0–100.0)
Platelets: 504 10*3/uL — ABNORMAL HIGH (ref 150–400)
RBC: 3.42 MIL/uL — ABNORMAL LOW (ref 3.87–5.11)
RDW: 19.5 % — ABNORMAL HIGH (ref 11.5–15.5)
WBC: 4.7 10*3/uL (ref 4.0–10.5)

## 2012-06-30 LAB — BASIC METABOLIC PANEL
BUN: 8 mg/dL (ref 6–23)
CO2: 36 mEq/L — ABNORMAL HIGH (ref 19–32)
Calcium: 9 mg/dL (ref 8.4–10.5)
Chloride: 99 mEq/L (ref 96–112)
Creatinine, Ser: 0.25 mg/dL — ABNORMAL LOW (ref 0.50–1.10)
GFR calc Af Amer: >90 mL/min (ref >90)
GFR calc non Af Amer: >90 mL/min (ref >90)
Glucose, Bld: 98 mg/dL (ref 70–99)
Potassium: 3.9 mEq/L (ref 3.5–5.1)
Sodium: 140 mEq/L (ref 135–145)

## 2012-07-01 ENCOUNTER — Other Ambulatory Visit (HOSPITAL_COMMUNITY): Payer: Managed Care, Other (non HMO)

## 2012-07-01 NOTE — Procedures (Signed)
US guided rt thora  650 cc blood tinged fluid Sent for labs Pt tolerated well  cxr pending

## 2012-07-03 ENCOUNTER — Other Ambulatory Visit (HOSPITAL_COMMUNITY): Payer: Managed Care, Other (non HMO)

## 2012-07-03 NOTE — Procedures (Signed)
US guided therapeutic left thoracentesis performed yielding 600 cc's yellow fluid. F/u CXR pending. No immediate complications.

## 2012-07-07 ENCOUNTER — Other Ambulatory Visit (HOSPITAL_COMMUNITY): Payer: Managed Care, Other (non HMO)

## 2012-07-07 DIAGNOSIS — Z930 Tracheostomy status: Secondary | ICD-10-CM

## 2012-07-07 DIAGNOSIS — R5381 Other malaise: Secondary | ICD-10-CM

## 2012-07-07 DIAGNOSIS — G709 Myoneural disorder, unspecified: Secondary | ICD-10-CM

## 2012-07-07 DIAGNOSIS — J9620 Acute and chronic respiratory failure, unspecified whether with hypoxia or hypercapnia: Secondary | ICD-10-CM

## 2012-07-07 DIAGNOSIS — E46 Unspecified protein-calorie malnutrition: Secondary | ICD-10-CM

## 2012-07-07 NOTE — Consult Note (Signed)
PULMONARY  / CRITICAL CARE MEDICINE  Name: [name] MRN: [id number] DOB: Apr 12, 1961    LOS: 17  REFERRING PROVIDER:  [facility]  CHIEF COMPLAINT:  Chronic respiratory failure.  BRIEF PATIENT DESCRIPTION: 52 year old severely malnutritioned female presenting to [facility] from [facility] for weaning.  PCCM had the patient in [facility] and she is very familiar to our service.  Patient weaned off the vent and has been capped x3 days.  PCCM called on consultation for tracheostomy management.  LINES / TUBES: Trach (JY) in [facility].  CULTURES: Noted  ANTIBIOTICS: Noted  PAST MEDICAL HISTORY :  Past Medical History  Diagnosis Date  . MS (multiple sclerosis)   . IBS (irritable bowel syndrome)   . PTSD (post-traumatic stress disorder)   . Fibromyalgia syndrome   . TMJ disease   . Difficult intubation     secondary to jaw muscles  . Fibromyalgia   . GERD (gastroesophageal reflux disease)   . Anorexia nervosa     h/o   . CAP (community acquired pneumonia)   . Anemia   . Shock circulatory   . Acute liver failure   . Cardiomyopathy   . Sacral decubitus ulcer    Past Surgical History  Procedure Date  . Cesarean section   . Bunionectomy   . Dilation and curettage of uterus 2007  . Tubal ligation   . Tracheostomy 06/2012   Prior to Admission medications   Medication Sig Start Date End Date Taking? Authorizing Provider  antiseptic oral rinse (BIOTENE) LIQD 15 mLs by Mouth Rinse route QID. 06/20/12   Jeanella Craze, NP  chlorhexidine (PERIDEX) 0.12 % solution Use as directed 15 mLs in the mouth or throat 2 (two) times daily. 06/20/12   Jeanella Craze, NP  cholecalciferol (VITAMIN D) 1000 UNITS tablet Place 1 tablet (1,000 Units total) into feeding tube daily. 06/20/12   Jeanella Craze, NP  collagenase (SANTYL) ointment Apply topically daily. 06/20/12   Jeanella Craze, NP  collagenase (SANTYL) ointment Apply topically daily. 06/20/12   Jeanella Craze, NP  fentaNYL (SUBLIMAZE) 0.05 MG/ML injection  Inject 1-2 mLs (50-100 mcg total) into the vein every 2 (two) hours as needed for severe pain. 06/20/12   Jeanella Craze, NP  Multiple Vitamin (MULTIVITAMIN) LIQD Take 5 mLs by mouth daily. 06/20/12   Jeanella Craze, NP  Nutritional Supplements (FEEDING SUPPLEMENT, OSMOLITE 1.2 CAL,) LIQD Place 1,000 mLs into feeding tube continuous. 06/20/12   Jeanella Craze, NP  pantoprazole sodium (PROTONIX) 40 mg/20 mL PACK Place 20 mLs (40 mg total) into feeding tube daily. 06/20/12   Jeanella Craze, NP  silver sulfADIAZINE (SILVADENE) 1 % cream Apply topically daily. 06/20/12   Jeanella Craze, NP  zolpidem (AMBIEN) 5 MG tablet Take 1 tablet (5 mg total) by mouth at bedtime as needed for sleep. 06/20/12   Jeanella Craze, NP   Allergies  Allergen Reactions  . Scopolamine Anaphylaxis and Shortness Of Breath    Respiratory distress  . Ivp Dye (Iodinated Diagnostic Agents) Other (See Comments)    unknown    FAMILY HISTORY:  No family history on file. SOCIAL HISTORY:  reports that she has never smoked. She has never used smokeless tobacco. She reports that she does not drink alcohol or use illicit drugs.  REVIEW OF SYSTEMS:   Constitutional: Negative for fever, chills, weight loss, malaise/fatigue and diaphoresis.  HENT: Negative for hearing loss, ear pain, nosebleeds, congestion, sore throat, neck pain, tinnitus and  ear discharge.   Eyes: Negative for blurred vision, double vision, photophobia, pain, discharge and redness.  Respiratory: Negative for cough, hemoptysis, sputum production, shortness of breath, wheezing and stridor.   Cardiovascular: Negative for chest pain, palpitations, orthopnea, claudication, leg swelling and PND.  Gastrointestinal: Negative for heartburn, nausea, vomiting, abdominal pain, diarrhea, constipation, blood in stool and melena.  Genitourinary: Negative for dysuria, urgency, frequency, hematuria and flank pain.  Musculoskeletal: Negative for myalgias, back pain, joint pain and  falls.  Skin: Negative for itching and rash.  Neurological: Negative for dizziness, tingling, tremors, sensory change, speech change, focal weakness, seizures, loss of consciousness, weakness and headaches.  Endo/Heme/Allergies: Negative for environmental allergies and polydipsia. Does not bruise/bleed easily.  VITAL SIGNS: VSS-AF  PHYSICAL EXAMINATION: General:  Chronically ill appearing emaciated female.  NAD. Neuro:  Alert and interactive, moving all ext to command. HEENT:  [place]/AT, PERRL, EOM-I and MMM.  Neck:  Supple, -LAN and -thyromegally.   Capped tracheosotmy cuffless size 4 tube. Cardiovascular:  RRR, Nl S1/S2, -M/R/G. Lungs:  Coarse BS diffusely, bibasilar crackles. Abdomen:  Soft, NT, ND and +BS. Musculoskeletal:  -edema and -tenderness. Skin:  Intact.  No results found for this basename: NA:3,K:3,CL:3,CO2:3,BUN:3,CREATININE:3,GLUCOSE:3 in the last 168 hours No results found for this basename: HGB:3,HCT:3,WBC:3,PLT:3 in the last 168 hours No results found.  ASSESSMENT / PLAN:  52 year old female with neuromuscular disorder that developed and aspiration and decompensated requiring vent support and subsequent tracheostomy.  Patient has been off the vent for days, trach capped x2 days.  Ready for decannulation. Plan: Decannulate.  Continue aggressive rehab.  Dietary support.  Will recheck in AM as the patient was an extremely difficult airway and would like to make sure patient will be able to decannulate successfully without respiratory difficulty.  Alyson Reedy, M.D. [facility] Pulmonary/Critical Care Medicine. Pager: [phone]. After hours pager: [phone].

## 2012-07-08 DIAGNOSIS — J9600 Acute respiratory failure, unspecified whether with hypoxia or hypercapnia: Secondary | ICD-10-CM

## 2012-07-08 DIAGNOSIS — Z930 Tracheostomy status: Secondary | ICD-10-CM

## 2012-07-08 NOTE — Consult Note (Signed)
PULMONARY  / CRITICAL CARE MEDICINE  Name: [name] MRN: [id number] DOB: May 17, 1961    LOS: 18  REFERRING PROVIDER:  [facility]  CHIEF COMPLAINT:  Chronic respiratory failure.  BRIEF PATIENT DESCRIPTION: 52 year old severely malnutritioned female presenting to [facility] from [facility] for weaning.  PCCM had the patient in [facility] and she is very familiar to our service.  Patient weaned off the vent and has been capped x3 days.  PCCM called on consultation for tracheostomy management.  LINES / TUBES: Trach (JY) in [facility].  CULTURES: Noted  ANTIBIOTICS: Noted  VITAL SIGNS: VSS-AF  PHYSICAL EXAMINATION: General:  Chronically ill appearing emaciated female.  NAD. Neuro:  Alert and interactive, moving all ext to command. HEENT:  [place]/AT, PERRL, EOM-I and MMM.  Neck:  Supple, -LAN and -thyromegally.   Capped tracheosotmy cuffless size 4 tube. Cardiovascular:  RRR, Nl S1/S2, -M/R/G. Lungs:  Coarse BS diffusely, bibasilar crackles. Abdomen:  Soft, NT, ND and +BS. Musculoskeletal:  -edema and -tenderness. Skin:  Intact.  No results found for this basename: NA:3,K:3,CL:3,CO2:3,BUN:3,CREATININE:3,GLUCOSE:3 in the last 168 hours No results found for this basename: HGB:3,HCT:3,WBC:3,PLT:3 in the last 168 hours No results found.  ASSESSMENT / PLAN:  52 year old female with neuromuscular disorder that developed and aspiration and decompensated requiring vent support and subsequent tracheostomy.  Patient has been off the vent for days, trach capped x2 days.  Ready for decannulation. Plan: Decannulated and doing well.  Continue aggressive rehab.  Dietary support.  PCCM will sign off, please call back if needed.  Alyson Reedy, M.D. [facility] Pulmonary/Critical Care Medicine. Pager: [phone]. After hours pager: [phone].

## 2012-07-09 LAB — BASIC METABOLIC PANEL
BUN: 14 mg/dL (ref 6–23)
CO2: 30 mEq/L (ref 19–32)
Calcium: 9.1 mg/dL (ref 8.4–10.5)
Chloride: 100 mEq/L (ref 96–112)
Creatinine, Ser: 0.22 mg/dL — ABNORMAL LOW (ref 0.50–1.10)
GFR calc Af Amer: >90 mL/min (ref >90)
GFR calc non Af Amer: >90 mL/min (ref >90)
Glucose, Bld: 114 mg/dL — ABNORMAL HIGH (ref 70–99)
Potassium: 4 mEq/L (ref 3.5–5.1)
Sodium: 141 mEq/L (ref 135–145)

## 2012-07-09 LAB — CBC
HCT: 29.8 % — ABNORMAL LOW (ref 36.0–46.0)
Hemoglobin: 9.3 g/dL — ABNORMAL LOW (ref 12.0–15.0)
MCH: 28.2 pg (ref 26.0–34.0)
MCHC: 31.2 g/dL (ref 30.0–36.0)
MCV: 90.3 fL (ref 78.0–100.0)
Platelets: 389 10*3/uL (ref 150–400)
RBC: 3.3 MIL/uL — ABNORMAL LOW (ref 3.87–5.11)
RDW: 17.6 % — ABNORMAL HIGH (ref 11.5–15.5)
WBC: 7.5 10*3/uL (ref 4.0–10.5)

## 2012-07-11 NOTE — Consult Note (Deleted)
PULMONARY  / CRITICAL CARE MEDICINE  Name: DAISJA KESSINGER MRN: [id number] DOB: [birth year]    LOS: 21  REFERRING PROVIDER:  [facility]  CHIEF COMPLAINT:  Chronic respiratory failure.  BRIEF PATIENT DESCRIPTION: 52 year old severely malnutritioned female presenting to [facility] from [facility] for weaning.  PCCM had the patient in [facility] and she is very familiar to our service.  Patient weaned off the vent and has been capped x3 days.  PCCM called on consultation for tracheostomy management.  LINES / TUBES: Trach (JY) in [facility].  CULTURES: Noted  ANTIBIOTICS: Noted  VITAL SIGNS: VSS-AF  PHYSICAL EXAMINATION: General:  Chronically ill appearing emaciated female.  NAD. Neuro:  Alert and interactive, moving all ext to command. HEENT:  [place]/AT, PERRL, EOM-I and MMM.  Neck:  Supple, -LAN and -thyromegally.   Capped tracheosotmy cuffless size 4 tube. Cardiovascular:  RRR, Nl S1/S2, -M/R/G. Lungs:  Coarse BS diffusely, bibasilar crackles. Abdomen:  Soft, NT, ND and +BS. Musculoskeletal:  -edema and -tenderness. Skin:  Intact.   Lab 07/09/12 0643  NA 141  K 4.0  CL 100  CO2 30  BUN 14  CREATININE 0.22*  GLUCOSE 114*    Lab 07/09/12 0643  HGB 9.3*  HCT 29.8*  WBC 7.5  PLT 389   No results found.  ASSESSMENT / PLAN:  52 year old female with neuromuscular disorder that developed and aspiration and decompensated requiring vent support and subsequent tracheostomy.  Patient has been off the vent for days, trach capped x2 days.  Ready for decannulation. Plan: Decannulated and doing well.  Continue aggressive rehab.  Dietary support.  PCCM will sign off, please call back if needed.  Alyson Reedy, M.D. [facility] Pulmonary/Critical Care Medicine. Pager: [phone]. After hours pager: [phone].

## 2012-07-12 LAB — CBC
HCT: 30 % — ABNORMAL LOW (ref 36.0–46.0)
Hemoglobin: 9.4 g/dL — ABNORMAL LOW (ref 12.0–15.0)
MCH: 28.5 pg (ref 26.0–34.0)
MCHC: 31.3 g/dL (ref 30.0–36.0)
MCV: 90.9 fL (ref 78.0–100.0)
Platelets: 402 10*3/uL — ABNORMAL HIGH (ref 150–400)
RBC: 3.3 MIL/uL — ABNORMAL LOW (ref 3.87–5.11)
RDW: 17.2 % — ABNORMAL HIGH (ref 11.5–15.5)
WBC: 7.8 10*3/uL (ref 4.0–10.5)

## 2012-07-12 LAB — BASIC METABOLIC PANEL
BUN: 14 mg/dL (ref 6–23)
CO2: 31 mEq/L (ref 19–32)
Calcium: 9.1 mg/dL (ref 8.4–10.5)
Chloride: 96 mEq/L (ref 96–112)
Creatinine, Ser: 0.21 mg/dL — ABNORMAL LOW (ref 0.50–1.10)
GFR calc Af Amer: >90 mL/min (ref >90)
GFR calc non Af Amer: >90 mL/min (ref >90)
Glucose, Bld: 106 mg/dL — ABNORMAL HIGH (ref 70–99)
Potassium: 3.9 mEq/L (ref 3.5–5.1)
Sodium: 136 mEq/L (ref 135–145)

## 2012-08-10 ENCOUNTER — Institutional Professional Consult (permissible substitution): Payer: Managed Care, Other (non HMO) | Admitting: Internal Medicine

## 2012-08-12 ENCOUNTER — Ambulatory Visit (INDEPENDENT_AMBULATORY_CARE_PROVIDER_SITE_OTHER): Payer: Managed Care, Other (non HMO) | Admitting: Internal Medicine

## 2012-08-12 ENCOUNTER — Encounter: Payer: Self-pay | Admitting: Internal Medicine

## 2012-08-12 VITALS — BP 109/57 | HR 97 | Ht 65.5 in | Wt 97.8 lb

## 2012-08-12 DIAGNOSIS — I429 Cardiomyopathy, unspecified: Secondary | ICD-10-CM

## 2012-08-12 DIAGNOSIS — I420 Dilated cardiomyopathy: Secondary | ICD-10-CM

## 2012-08-12 DIAGNOSIS — I428 Other cardiomyopathies: Secondary | ICD-10-CM

## 2012-08-12 DIAGNOSIS — R579 Shock, unspecified: Secondary | ICD-10-CM

## 2012-08-12 DIAGNOSIS — D649 Anemia, unspecified: Secondary | ICD-10-CM

## 2012-08-12 MED ORDER — CARVEDILOL 3.125 MG PO TABS: 3.1250 mg | ORAL_TABLET | Freq: Two times a day (BID) | ORAL | Status: DC

## 2012-08-12 MED ORDER — LISINOPRIL 5 MG PO TABS: 2.5000 mg | ORAL_TABLET | Freq: Every day | ORAL | Status: DC

## 2012-08-12 MED ORDER — FUROSEMIDE 20 MG PO TABS: 20.0000 mg | ORAL_TABLET | ORAL | Status: DC

## 2012-08-12 NOTE — Patient Instructions (Addendum)
Your physician recommends that you schedule a follow-up appointment ---next available for Dr Shirlee Latch ---CHF   Your physician has recommended you make the following change in your medication:  1) Stop Metoprolol 2) Start Carvedilol 3.125 mg twice daily 3) Start Lisinopril 2.5mg  daily 4) Decrease Furosemide to one every other day

## 2012-08-14 ENCOUNTER — Encounter: Payer: Self-pay | Admitting: Internal Medicine

## 2012-08-14 NOTE — Assessment & Plan Note (Signed)
Shock was due to medical illness and is resolved Cardiomyopathy as above

## 2012-08-14 NOTE — Progress Notes (Signed)
Primary Care Physician: Gweneth Dimitri, MD  [name] is an unfortunate 52 y.o. female with a h/o longstanding anorexia/ cachexia and recent prolonged hospitalization due to failure to thrive and respiratory failure who presents for cardiology evaluation.  She presented to [facility] 12/13 with hypotension, bradycardia, and hypothermia with associated altered mental status.  She was profoundly anemic (Hb 3) and chronically ill with decubiti observed.  Her WB was 30 upon admit and she was determined to have enterococcal UTI and serratia PNA.  During her hospitalization, she was found to have a newly diagnosed cardiomyopathy with acute systolic failure necessitating milrinone therapy.  She had a long hospitalization with prolonged intubation and eventual tracheostomy.  [facility].  She remains chronically ill due to an undetermined neuromuscular disease.  Today, she denies symptoms of palpitations, chest pain, shortness of breath, orthopnea, PND, lower extremity edema, dizziness, presyncope, or syncope.  She is not very active and requires wheelchair for ambulation.  Due to chronic inability to [facility], she has a PEG tube in place.  Her spouse feels that her nutritian is improving. The patient is tolerating medications without difficulties and is otherwise without complaint today.   Past Medical History  Diagnosis Date  . MS (multiple sclerosis)   . IBS (irritable bowel syndrome)   . PTSD (post-traumatic stress disorder)   . Fibromyalgia syndrome   . TMJ disease   . Difficult intubation     secondary to jaw muscles  . Fibromyalgia   . GERD (gastroesophageal reflux disease)   . Anorexia nervosa     h/o   . CAP (community acquired pneumonia)   . Anemia   . Shock circulatory   . Acute liver failure   . Cardiomyopathy   . Sacral decubitus ulcer    Past Surgical History  Procedure Laterality Date  . Cesarean  section    . Bunionectomy    . Dilation and curettage of uterus  2007  . Tubal ligation    . Tracheostomy  06/2012    Current Outpatient Prescriptions  Medication Sig Dispense Refill  . cholecalciferol (VITAMIN D) 1000 UNITS tablet Place 4,000 Units into feeding tube daily.      . furosemide (LASIX) 20 MG tablet Take 1 tablet (20 mg total) by mouth every other day.  30 tablet    . HYDROmorphone (DILAUDID) 2 MG tablet 2 mg by Feeding Tube route every 6 (six) hours as needed.      . hyoscyamine (LEVSIN SL) 0.125 MG SL tablet 0.125 mg by Feeding Tube route as needed.      . Multiple Vitamin (MULTIVITAMIN) LIQD Take 5 mLs by mouth daily.      . Multiple Vitamins-Minerals (CEROVITE ADVANCED FORMULA) LIQD Take 15 mLs by mouth daily.      Marland Kitchen omeprazole (PRILOSEC) 20 MG capsule Take 20 mg by mouth 2 (two) times daily.      Marland Kitchen UNABLE TO FIND 20 mLs by Feeding Tube route daily. Med Name: [place] [facility]      . carvedilol (COREG) 3.125 MG tablet Take 1 tablet (3.125 mg total) by mouth 2 (two) times daily.  180 tablet  3  . lisinopril (PRINIVIL,ZESTRIL) 5 MG tablet Take 0.5 tablets (2.5 mg total) by mouth daily.  45 tablet  3   No current facility-administered medications for this visit.    Allergies  Allergen Reactions  . Scopolamine Anaphylaxis and Shortness Of Breath    Respiratory distress  .  Ivp Dye (Iodinated Diagnostic Agents) Other (See Comments)    unknown    History   Social History  . Marital Status: Married    Spouse Name: N/A    Number of Children: N/A  . Years of Education: N/A   Occupational History  . Not on file.   Social History Main Topics  . Smoking status: Never Smoker   . Smokeless tobacco: Never Used  . Alcohol Use: No  . Drug Use: No  . Sexually Active: No   Other Topics Concern  . Not on file   Social History Narrative  . No narrative on file    FH- she denies FH of cardiomyopathy  ROS- All systems are reviewed and negative except as per the HPI  above  Physical Exam: Filed Vitals:   08/12/12 1211  BP: 109/57  Pulse: 97  Height: 5' 5.5" (1.664 m)  Weight: 97 lb 12.8 oz (44.362 kg)    GEN- The patient is chronically ill and emaciated appearing, alert and oriented x 3 today.   Head- normocephalic, atraumatic, bitemporal wasting is observed Eyes-  Sclera clear, conjunctiva pink Ears- hearing intact Oropharynx- clear Neck- supple, no JVP Lungs- Clear to ausculation bilaterally, normal work of breathing Heart- Regular rate and rhythm, no murmurs, rubs or gallops, PMI not laterally displaced GI- soft, NT, ND, + BS Extremities- no clubbing, cyanosis, or edema MS- diffuse severe atrophy, in a wheelchair today Skin- sacral decubiti not examined today Neuro- diffusely weak  EKG- sinus rhythm 91 bpm, PR 104 without obvious pre-excitation, RsR'  Assessment and Plan:

## 2012-08-14 NOTE — Assessment & Plan Note (Signed)
Per primary care 

## 2012-08-14 NOTE — Assessment & Plan Note (Signed)
Echo 12/13 is reviewed and reveals at least moderate biventricular dysfunction.  LVEF 20%.  This was observed in the setting of extremis/ acute medical illness.  The cause of her cardiomyopathy is unknown.  She has no ischemic symptoms and I think that an ischemic CM is less likely.  She denies FH of cardiomyopathy.  Her cardiomyopathy could be related to her unexplained neuromuscular disease or possibly due to malnutrition or high output state (her initial hemaglobin was 3). At this point, I think that we should optimize her medical therapy and reevaluate.  I will switch metoprolol to coreg and also add low dose lisinopril. As her nutrition improves, her EF should be reassessed.  If her EF remains weak then additional testing to better determine the etiology of her low EF should be performed.  I will defer stress testing at this time and refer her to one of our heart failure specialists for further management. I do not think that she would require further EP management at this time.  Her presenting bradycardia appears to have been due to acute medical illness and has resolved.  She would not be a candidate for an ICD (even after 3 months of optimal medical therapy) unless her malnutrition disorder, sacral decubs, etc are completely resolved and her EF remains depressed. I will therefore see her as needed going forward.

## 2012-08-16 ENCOUNTER — Telehealth: Payer: Self-pay | Admitting: Internal Medicine

## 2012-08-16 NOTE — Telephone Encounter (Signed)
Started Carvedilol and Lisinopril both on Saturday. After starting she notice numbness, tingling in hands and feet, and a rash. Patient feels it is coming from the Carvedilol (wants to go back to her Metoprolol tart. 25 mg 1/2 twice a day) since she left it off last night and much better today. Has not taken either medication toady.  Will forward to Dr Johney Frame for review

## 2012-08-16 NOTE — Telephone Encounter (Signed)
Discussed with Dawayne Patricia NP and will have patient go back to her Metoprolol tart. 25 mg 1/2 tablet twice a day and continue the Lisinopril until Dr Johney Frame can review. Advised patient. Will forward to [employer] and Dr Johney Frame

## 2012-08-16 NOTE — Telephone Encounter (Signed)
Follow Up   Pt would like to speak to nurse regarding her heart meds. She has not taken any heart meds today and she is concerned about it.

## 2012-08-16 NOTE — Telephone Encounter (Signed)
Pt having side effects from carvedilol, numbness, tingling in hands and feet, rash on abdomen, didn't take it last night and better today,  pls call [phone] would like to go back to metoprolol if possible

## 2012-08-17 NOTE — Telephone Encounter (Signed)
Discussed with Dr Johney Frame will continue with Metoprolol and Lisinopril until she has her follow up with Dr Shirlee Latch  Patient will let us know if she needs refill

## 2012-08-29 ENCOUNTER — Ambulatory Visit (INDEPENDENT_AMBULATORY_CARE_PROVIDER_SITE_OTHER): Payer: Managed Care, Other (non HMO) | Admitting: Cardiology

## 2012-08-29 ENCOUNTER — Encounter: Payer: Self-pay | Admitting: Cardiology

## 2012-08-29 VITALS — BP 114/68 | HR 97 | Ht 65.5 in | Wt 102.0 lb

## 2012-08-29 DIAGNOSIS — I5022 Chronic systolic (congestive) heart failure: Secondary | ICD-10-CM

## 2012-08-29 DIAGNOSIS — I428 Other cardiomyopathies: Secondary | ICD-10-CM

## 2012-08-29 DIAGNOSIS — D649 Anemia, unspecified: Secondary | ICD-10-CM

## 2012-08-29 DIAGNOSIS — E46 Unspecified protein-calorie malnutrition: Secondary | ICD-10-CM

## 2012-08-29 DIAGNOSIS — I509 Heart failure, unspecified: Secondary | ICD-10-CM

## 2012-08-29 DIAGNOSIS — G709 Myoneural disorder, unspecified: Secondary | ICD-10-CM

## 2012-08-29 MED ORDER — BISOPROLOL FUMARATE 5 MG PO TABS: 5.0000 mg | ORAL_TABLET | Freq: Every day | ORAL | Status: DC

## 2012-08-29 NOTE — Progress Notes (Signed)
Patient ID: KILAH DRAHOS, female   DOB: 1961/03/30, 52 y.o.   MRN: [id number] PCP: Dr. Uvaldo Rising  52 yo with complicated past medical history presents for evaluation of her cardiomyopathy.  I am seeing her for the first time today and reviewed all her records.  She has a long history of neuromuscular disorder.  The exact diagnosis remains undetermined.  She is confined to a wheelchair.  In 12/13, she was admitted to [facility] with hypotension, hypothermia, and altered mental status.   She was found to have profound anemia with hemoglobin down to 3.  She was transfused 4 units.  She also was found to have Enterococcal UTI and Serratia PNA.  She was intubated and eventually required tracheostomy.  During her hospitalization, she was found to have a cardiomyopathy with EF 20-25% by echo.  She was treated with milrinone.  She is now home again with her husband.  [name] is out.  She has a PEG tube.    She has been walking daily with her walker.  She is weak but denies exertional dyspnea or chest pain.  Weight has been stable on current Lasix dose.  She was started on Coreg after her last appointment with Dr. Johney Frame but immediately developed a rash and numbness in her hands and feet.  She went back to metoprolol and stopped Coreg.  BP has been stable with no lightheadedness.   ECG: NSR, normal (2/14)  Labs (1/14): K 3.9, creatinine 0.21, HCT 30  PMH: 1. Anorexia/cachexia/failure to thrive: Patient had a PEG tube.  2. Neuromuscular disease: exact diagnosis is still undetermined despite extensive neurology evaluation.  She does not have multiple sclerosis.  3. Fibromyalgia 4. IBS 5. TMJ syndrome 6. PTSD 7. GERD 8. Sacral decubitus ulcer 9. H/o aspiration 10. 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  Echo (12/13) with EF 20-25% with diffuse hypokinesis, mild MR, moderate RV dilation and moderate RV dysfunction.   11. Anemia  SH: Lives with  husband in [place].  Quit smoking > 20 years ago.  No ETOH.     FH: No history of cardiomyopathy.  Grandfather had MI at 88.   ROS: All systems reviewed and negative except as per HPI.   Current Outpatient Prescriptions  Medication Sig Dispense Refill  . cholecalciferol (VITAMIN D) 1000 UNITS tablet Place 4,000 Units into feeding tube daily.      . furosemide (LASIX) 20 MG tablet Take 1 tablet (20 mg total) by mouth every other day.  30 tablet    . HYDROmorphone (DILAUDID) 2 MG tablet 2 mg by Feeding Tube route every 6 (six) hours as needed.      . hyoscyamine (LEVSIN SL) 0.125 MG SL tablet 0.125 mg by Feeding Tube route as needed.      . lactulose (CHRONULAC) 10 GM/15ML solution as directed.      Marland Kitchen lisinopril (PRINIVIL,ZESTRIL) 5 MG tablet Take 0.5 tablets (2.5 mg total) by mouth daily.  45 tablet  3  . Multiple Vitamins-Minerals (CEROVITE ADVANCED FORMULA) LIQD Take 15 mLs by mouth daily.      Marland Kitchen omeprazole (PRILOSEC) 20 MG capsule Take 20 mg by mouth 2 (two) times daily.      . bisoprolol (ZEBETA) 5 MG tablet Take 1 tablet (5 mg total) by mouth daily.  30 tablet  3   No current facility-administered medications for this visit.    BP 114/68  Pulse 97  Ht 5' 5.5" (1.664 m)  Wt 102 lb (  46.267 kg)  BMI 16.71 kg/m2  SpO2 98% General: NAD, thin.  Neck: No JVD, no thyromegaly or thyroid nodule.  Lungs: Clear to auscultation bilaterally with normal respiratory effort. CV: Nondisplaced PMI.  Heart regular S1/S2, no S3/S4, no murmur.  No peripheral edema.  No carotid bruit.  Normal pedal pulses.  Abdomen: Soft, nontender, no hepatosplenomegaly, no distention.  Neurologic: Alert and oriented x 3.  Psych: Normal affect. Extremities: No clubbing or cyanosis.   Assessment/Plan: 1. Cardiomyopathy: Etiology uncertain.  Most likely nonischemic.  It is possible that this was a septic cardiomyopathy (form of stress cardiomyopathy) that could improve over time.  Cannot rule out cardiomyopathy  related to malnutrition or high output heart failure in the setting of very low hemoglobin when she was in the hospital.  No chest pain, no ECG changes to suggest CAD.  She appears euvolemic on exam.  NYHA class II-III symptoms (not very active).   - Continue lisinopril and Lasix at current doses. - Stop metoprolol, start bisoprolol 5 mg daily.  - Followup in 3 weeks for medication titration.  - Echo in 3 months to assess for function recovery.  Titrate up lisinopril and Coreg prior to this.  - If EF remains low on followup echo, she should have ischemic evaluation with Sestamibi or cath.  2. Anemia: ? Etiology.  HCT appears to be staying up.  3. Failure to thrive: Patient is gaining weight with PEG tube feedings. 4. Neuromuscular disorder: Not fully characterized.  She is in a wheelchair most of the time.  Marca Ancona 08/30/2012

## 2012-08-29 NOTE — Patient Instructions (Addendum)
Stop metoprolol tartrate(lopressor).  Start Bisoprolol 5mg  daily.    You have a follow-up appointment with Dr Shirlee Latch Monday March 17,2014 at 12 Noon.

## 2012-08-30 DIAGNOSIS — I5022 Chronic systolic (congestive) heart failure: Secondary | ICD-10-CM | POA: Insufficient documentation

## 2012-09-08 ENCOUNTER — Encounter: Payer: Self-pay | Admitting: Cardiology

## 2012-09-19 ENCOUNTER — Ambulatory Visit: Payer: Managed Care, Other (non HMO) | Admitting: Cardiology

## 2012-10-11 ENCOUNTER — Ambulatory Visit (INDEPENDENT_AMBULATORY_CARE_PROVIDER_SITE_OTHER): Payer: Managed Care, Other (non HMO) | Admitting: Cardiology

## 2012-10-11 ENCOUNTER — Encounter: Payer: Self-pay | Admitting: Cardiology

## 2012-10-11 VITALS — BP 128/68 | HR 77 | Ht 65.5 in | Wt 107.1 lb

## 2012-10-11 DIAGNOSIS — I429 Cardiomyopathy, unspecified: Secondary | ICD-10-CM

## 2012-10-11 DIAGNOSIS — I5022 Chronic systolic (congestive) heart failure: Secondary | ICD-10-CM

## 2012-10-11 DIAGNOSIS — E46 Unspecified protein-calorie malnutrition: Secondary | ICD-10-CM

## 2012-10-11 DIAGNOSIS — I428 Other cardiomyopathies: Secondary | ICD-10-CM

## 2012-10-11 DIAGNOSIS — I509 Heart failure, unspecified: Secondary | ICD-10-CM

## 2012-10-11 MED ORDER — LISINOPRIL 5 MG PO TABS: 5.0000 mg | ORAL_TABLET | Freq: Every day | ORAL | Status: DC

## 2012-10-11 MED ORDER — FUROSEMIDE 20 MG PO TABS: 20.0000 mg | ORAL_TABLET | ORAL | Status: DC

## 2012-10-11 MED ORDER — BISOPROLOL FUMARATE 5 MG PO TABS: 5.0000 mg | ORAL_TABLET | Freq: Every day | ORAL | Status: DC

## 2012-10-11 NOTE — Patient Instructions (Addendum)
Increase lisinopril to 5mg  daily.  Your physician recommends that you return for lab work in: 10 days--BMET/BNP  Your physician recommends that you schedule a follow-up appointment in: 1 month with Dr Shirlee Latch.

## 2012-10-11 NOTE — Progress Notes (Signed)
Patient ID: [name], female   DOB: [birth year], 52 y.o.   MRN: [id number] PCP: Dr. Corliss Blacker  52 yo with complicated past medical history presents for evaluation of her cardiomyopathy.  She has a long history of neuromuscular disorder.  The exact diagnosis remains undetermined.  She is confined to a wheelchair.  In 12/13, she was admitted to [facility] with hypotension, hypothermia, and altered mental status.   She was found to have profound anemia with hemoglobin down to 3.  She was transfused 4 units.  She also was found to have Enterococcal UTI and Serratia PNA.  She was intubated and eventually required tracheostomy.  During her hospitalization, she was found to have a cardiomyopathy with EF 20-25% by echo.  She was treated with milrinone.  She is now home again with her husband.  [name] is out.  She has a PEG tube.    She has been walking daily with her walker.  She is weak but denies exertional dyspnea or chest pain.  BP has been stable with lightheadedness only if she stands for a prolonged period. She has been eating better and has gained weight.  At last appointment, I put her on bisoprolol.  This seems to have improved her palpitations considerably.  Last weekend, she woke up once with severe chest burning.  This lasted for about 30 minutes.  She has not had chest pain before this or since.    Labs (1/14): K 3.9, creatinine 0.21, HCT 30  PMH: 1. Anorexia/cachexia/failure to thrive: Patient had a PEG tube.  2. Neuromuscular disease: exact diagnosis is still undetermined despite extensive neurology evaluation.  She does not have multiple sclerosis.  3. Fibromyalgia 4. IBS 5. TMJ syndrome 6. PTSD 7. GERD 8. Sacral decubitus ulcer 9. H/o aspiration 10. 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  Echo (12/13) with EF 20-25% with diffuse hypokinesis, mild MR, moderate RV dilation and moderate RV dysfunction.   11. Anemia  SH: Lives  with husband in [place].  Quit smoking > 20 years ago.  No ETOH.     FH: No history of cardiomyopathy.  Grandfather had MI at 63.   ROS: All systems reviewed and negative except as per HPI.   Current Outpatient Prescriptions  Medication Sig Dispense Refill  . bisoprolol (ZEBETA) 5 MG tablet Take 1 tablet (5 mg total) by mouth daily.  30 tablet  3  . cholecalciferol (VITAMIN D) 1000 UNITS tablet Place 4,000 Units into feeding tube daily.      . furosemide (LASIX) 20 MG tablet Take 1 tablet (20 mg total) by mouth every other day.  15 tablet  3  . HYDROmorphone (DILAUDID) 2 MG tablet 2 mg by Feeding Tube route every 6 (six) hours as needed.      . hyoscyamine (LEVSIN SL) 0.125 MG SL tablet 0.125 mg by Feeding Tube route as needed.      . lactulose (CHRONULAC) 10 GM/15ML solution as directed.      . Multiple Vitamins-Minerals (CEROVITE ADVANCED FORMULA) LIQD Take 15 mLs by mouth daily.      Marland Kitchen omeprazole (PRILOSEC) 20 MG capsule Take 20 mg by mouth 2 (two) times daily.      Marland Kitchen lisinopril (PRINIVIL,ZESTRIL) 5 MG tablet Take 1 tablet (5 mg total) by mouth daily.  30 tablet  3   No current facility-administered medications for this visit.    BP 128/68  Pulse 77  Ht 5' 5.5" (1.664 m)  Wt 107  lb 1.9 oz (48.589 kg)  BMI 17.55 kg/m2  SpO2 99% General: NAD, thin.  Neck: No JVD, no thyromegaly or thyroid nodule.  Lungs: Clear to auscultation bilaterally with normal respiratory effort. CV: Nondisplaced PMI.  Heart regular S1/S2, no S3/S4, no murmur.  No peripheral edema.  No carotid bruit.  Normal pedal pulses.  Abdomen: Soft, nontender, no hepatosplenomegaly, no distention.  Neurologic: Alert and oriented x 3.  Psych: Normal affect. Extremities: No clubbing or cyanosis.   Assessment/Plan: 1. Cardiomyopathy: Etiology uncertain.  Most likely nonischemic.  It is possible that this was a septic cardiomyopathy (form of stress cardiomyopathy) that could improve over time.  Cannot rule out  cardiomyopathy related to malnutrition or high output heart failure in the setting of very low hemoglobin when she was in the hospital.  No exertional chest pain, no ECG changes to suggest CAD.  She did have one episode of nonexertional chest pain in bed last weekend.  She appears euvolemic on exam.  NYHA class II-III symptoms (not very active).   - bisoprolol and Lasix at current doses. - Increase lisinopril to 5 mg daily today.  BMET/BNP in 10 days.   - Followup in 1 month for medication titration.  Will get echo after this to assess for recovery of function.   - If EF remains low on followup echo, she should have ischemic evaluation with Sestamibi or cath.  Would also get Lexiscan Sestamibi if she has further chest pain.   2. Anemia: ? Etiology.  HCT appears to be staying up.  3. Failure to thrive: Patient is gaining weight with PEG tube feedings. 4. Neuromuscular disorder: Not fully characterized.  She is in a wheelchair most of the time.  Marca Ancona 10/11/2012

## 2012-10-24 ENCOUNTER — Other Ambulatory Visit (INDEPENDENT_AMBULATORY_CARE_PROVIDER_SITE_OTHER): Payer: Managed Care, Other (non HMO)

## 2012-10-24 DIAGNOSIS — I429 Cardiomyopathy, unspecified: Secondary | ICD-10-CM

## 2012-10-24 DIAGNOSIS — I428 Other cardiomyopathies: Secondary | ICD-10-CM

## 2012-10-24 LAB — BASIC METABOLIC PANEL
BUN: 13 mg/dL (ref 6–23)
CO2: 33 mEq/L — ABNORMAL HIGH (ref 19–32)
Calcium: 9.3 mg/dL (ref 8.4–10.5)
Chloride: 98 mEq/L (ref 96–112)
Creatinine, Ser: 0.4 mg/dL (ref 0.4–1.2)
GFR: 179.83 mL/min (ref >60.00)
Glucose, Bld: 97 mg/dL (ref 70–99)
Potassium: 4 mEq/L (ref 3.5–5.1)
Sodium: 138 mEq/L (ref 135–145)

## 2012-10-24 LAB — BRAIN NATRIURETIC PEPTIDE: Pro B Natriuretic peptide (BNP): 19 pg/mL (ref 0.0–100.0)

## 2012-10-25 ENCOUNTER — Telehealth: Payer: Self-pay | Admitting: Cardiology

## 2012-10-25 NOTE — Telephone Encounter (Signed)
New Prob    Pt states her BP has been dropping very low and is concerned. Would like to speak to nurse.

## 2012-10-25 NOTE — Telephone Encounter (Signed)
Pt advised,verbalized understanding. 

## 2012-10-25 NOTE — Telephone Encounter (Signed)
Spoke with pt. Pt states her BP for the last 10 days has been averaging 101-103 systolic in the AM before meds.  Her systolic BP was 99 yesterday before meds. She checked her BP before bedtime because she felt cold and tired. Her  BP before bedtime last night was 89/45. This is the first time she checked it at bedtime. This AM her systolic was 103 and she took her meds. Lisinopril was increased to 5mg  daily 10/09/12. Pt asking if her BP is too low. I will forward to Dr Shirlee Latch for review.

## 2012-10-25 NOTE — Telephone Encounter (Signed)
She is thin so would expect a lowish BP.  Would continue current medications as long as she is not getting a lot of lightheadedness.

## 2012-11-21 ENCOUNTER — Ambulatory Visit (INDEPENDENT_AMBULATORY_CARE_PROVIDER_SITE_OTHER): Payer: Managed Care, Other (non HMO) | Admitting: Cardiology

## 2012-11-21 ENCOUNTER — Encounter: Payer: Self-pay | Admitting: Cardiology

## 2012-11-21 VITALS — BP 116/70 | HR 62 | Ht 65.5 in | Wt 114.0 lb

## 2012-11-21 DIAGNOSIS — I5022 Chronic systolic (congestive) heart failure: Secondary | ICD-10-CM

## 2012-11-21 DIAGNOSIS — I429 Cardiomyopathy, unspecified: Secondary | ICD-10-CM

## 2012-11-21 DIAGNOSIS — I509 Heart failure, unspecified: Secondary | ICD-10-CM

## 2012-11-21 DIAGNOSIS — I428 Other cardiomyopathies: Secondary | ICD-10-CM

## 2012-11-21 MED ORDER — FUROSEMIDE 20 MG PO TABS: 20.0000 mg | ORAL_TABLET | ORAL | Status: DC

## 2012-11-21 NOTE — Patient Instructions (Addendum)
Your physician has requested that you have an echocardiogram. Echocardiography is a painless test that uses sound waves to create images of your heart. It provides your doctor with information about the size and shape of your heart and how well your heart's chambers and valves are working. This procedure takes approximately one hour. There are no restrictions for this procedure. In the next week or so.   Your physician recommends that you schedule a follow-up appointment in: 3 months with Dr Shirlee Latch.

## 2012-11-22 NOTE — Progress Notes (Signed)
Patient ID: [name], female   DOB: 26-May-1961, 52 y.o.   MRN: [id number] PCP: Dr. Corliss Blacker  52 yo with complicated past medical history presents for evaluation of her cardiomyopathy.  She has a long history of neuromuscular disorder.  The exact diagnosis remains undetermined.  She is confined to a wheelchair.  In 12/13, she was admitted to [facility] with hypotension, hypothermia, and altered mental status.   She was found to have profound anemia with hemoglobin down to 3.  She was transfused 4 units.  She also was found to have Enterococcal UTI and Serratia PNA.  She was intubated and eventually required tracheostomy.  During her hospitalization, she was found to have a cardiomyopathy with EF 20-25% by echo.  She was treated with milrinone.  She is now home again with her husband.  [name] is out.  She has a PEG tube.    She has been walking daily with her walker.  She is weak but denies exertional dyspnea or chest pain.  BP has been stable with lightheadedness only if she stands for a prolonged period currently.  She did have some difficulty with "wooziness" when she first started lisinopril but this has improved.  She is getting out more.  SBP 90s-100s at home.   Labs (1/14): K 3.9, creatinine 0.21, HCT 30 Labs (4/14): K 4, creatinine 0.4, BNP 19  PMH: 1. Anorexia/cachexia/failure to thrive: Patient had a PEG tube.  2. Neuromuscular disease: exact diagnosis is still undetermined despite extensive neurology evaluation.  She does not have multiple sclerosis.  3. Fibromyalgia 4. IBS 5. TMJ syndrome 6. PTSD 7. GERD 8. Sacral decubitus ulcer 9. H/o aspiration 10. 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  Echo (12/13) with EF 20-25% with diffuse hypokinesis, mild MR, moderate RV dilation and moderate RV dysfunction.   11. Anemia  SH: Lives with husband in [place].  Quit smoking > 20 years ago.  No ETOH.     FH: No history of  cardiomyopathy.  Grandfather had MI at 63.   ROS: All systems reviewed and negative except as per HPI.   Current Outpatient Prescriptions  Medication Sig Dispense Refill  . bisoprolol (ZEBETA) 5 MG tablet Take 1 tablet (5 mg total) by mouth daily.  30 tablet  3  . cholecalciferol (VITAMIN D) 1000 UNITS tablet Place 4,000 Units into feeding tube daily.      . furosemide (LASIX) 20 MG tablet Take 1 tablet (20 mg total) by mouth every other day.  15 tablet  6  . HYDROmorphone (DILAUDID) 2 MG tablet 2 mg by Feeding Tube route every 6 (six) hours as needed.      . lactulose (CHRONULAC) 10 GM/15ML solution as directed.      Marland Kitchen lisinopril (PRINIVIL,ZESTRIL) 5 MG tablet Take 1 tablet (5 mg total) by mouth daily.  30 tablet  3  . Multiple Vitamins-Minerals (CEROVITE ADVANCED FORMULA) LIQD Take 15 mLs by mouth daily.      Marland Kitchen omeprazole (PRILOSEC) 20 MG capsule Take 20 mg by mouth 2 (two) times daily.       No current facility-administered medications for this visit.    BP 116/70  Pulse 62  Ht 5' 5.5" (1.664 m)  Wt 114 lb (51.71 kg)  BMI 18.68 kg/m2  SpO2 99% General: NAD, thin.  Neck: No JVD, no thyromegaly or thyroid nodule.  Lungs: Clear to auscultation bilaterally with normal respiratory effort. CV: Nondisplaced PMI.  Heart regular S1/S2, no S3/S4,  no murmur.  No peripheral edema.  No carotid bruit.  Normal pedal pulses.  Abdomen: Soft, nontender, no hepatosplenomegaly, no distention.  Neurologic: Alert and oriented x 3.  Psych: Normal affect. Extremities: No clubbing or cyanosis.   Assessment/Plan: 1. Cardiomyopathy: Etiology uncertain.  Most likely nonischemic.  It is possible that this was a septic cardiomyopathy (form of stress cardiomyopathy) that could improve over time.  Cannot rule out cardiomyopathy related to malnutrition or high output heart failure in the setting of very low hemoglobin when she was in the hospital.  No exertional chest pain, no ECG changes to suggest CAD.  She  appears euvolemic on exam.  NYHA class II-III symptoms (not very active).   - I will not titrate meds today given lowish BP and prolonged symptoms after medication increases. - Echo this month.   If EF remains low on followup echo, she should have ischemic evaluation with Sestamibi or cath.  If EF < 35%, will refer to EP for ICD and will also increase spironolactone to 25 mg daily.  2. Neuromuscular disorder: Not fully characterized.  She is in a wheelchair most of the time.  Marca Ancona 11/22/2012

## 2012-11-30 ENCOUNTER — Ambulatory Visit (HOSPITAL_COMMUNITY): Payer: Managed Care, Other (non HMO) | Attending: Cardiovascular Disease

## 2012-11-30 ENCOUNTER — Other Ambulatory Visit (HOSPITAL_COMMUNITY): Payer: Self-pay | Admitting: Cardiology

## 2012-11-30 DIAGNOSIS — I5022 Chronic systolic (congestive) heart failure: Secondary | ICD-10-CM

## 2012-11-30 DIAGNOSIS — I079 Rheumatic tricuspid valve disease, unspecified: Secondary | ICD-10-CM | POA: Insufficient documentation

## 2012-11-30 DIAGNOSIS — I509 Heart failure, unspecified: Secondary | ICD-10-CM

## 2012-11-30 DIAGNOSIS — I428 Other cardiomyopathies: Secondary | ICD-10-CM | POA: Insufficient documentation

## 2012-11-30 NOTE — Progress Notes (Signed)
Echocardiogram performed.  

## 2012-12-27 ENCOUNTER — Telehealth: Payer: Self-pay | Admitting: Cardiology

## 2012-12-27 MED ORDER — LISINOPRIL 5 MG PO TABS: ORAL_TABLET | ORAL | Status: DC

## 2012-12-27 NOTE — Telephone Encounter (Signed)
Pt advised,verbalized understanding. 

## 2012-12-27 NOTE — Telephone Encounter (Signed)
Spoke with patient about BP readings. Pt states average SBP 86-91 for June.  Only 2 days in June when SBP over 100. Today SBP is 83. Pt states she is lightheaded and dizzy today, she feels tired and has been in bed today. I will forward to Dr Shirlee Latch for review.

## 2012-12-27 NOTE — Telephone Encounter (Signed)
New Prob     Would like to speak to nurse regarding low BP. Please call.

## 2012-12-27 NOTE — Telephone Encounter (Signed)
Decrease lisinopril to 2.5 mg daily.  If she feels better, no other changes.  If she is still lightheaded, can decrease bisoprolol to 2.5 mg daily.

## 2013-01-12 ENCOUNTER — Other Ambulatory Visit (HOSPITAL_COMMUNITY): Payer: Self-pay | Admitting: Family Medicine

## 2013-01-12 ENCOUNTER — Ambulatory Visit (HOSPITAL_COMMUNITY)
Admission: RE | Admit: 2013-01-12 | Discharge: 2013-01-12 | Disposition: A | Discharge status: Home / Self Care | Payer: Managed Care, Other (non HMO) | Source: Ambulatory Visit | Attending: Family Medicine | Admitting: Family Medicine

## 2013-01-12 DIAGNOSIS — E46 Unspecified protein-calorie malnutrition: Secondary | ICD-10-CM

## 2013-01-12 DIAGNOSIS — Z431 Encounter for attention to gastrostomy: Secondary | ICD-10-CM | POA: Insufficient documentation

## 2013-02-27 ENCOUNTER — Telehealth: Payer: Self-pay | Admitting: Cardiology

## 2013-02-27 NOTE — Telephone Encounter (Signed)
Spoke with patient. Pt states for 4 weeks she has increasing distention in abdomen, during this time she has had increasing constipation and has been adjusting lactulose. BP yesterday lower than normal 86/50, 99- 94 SBP are usual. Pt had increase in SOB and increase in abdominal distention through the night and today. Pt states her weight  01/28/13 120.2lbs  02/09/13 119.6 lbs 02/17/13 119 lbs 02/27/13 118lbs. Pt takes lasix every other day and took lasix yesterday.  Has decreased her caloric intake and had good BM yesterday. Pt ahs appt to see Dr Shirlee Latch 03/01/13. I will forward to Dr Shirlee Latch for review

## 2013-02-27 NOTE — Telephone Encounter (Signed)
New problem  Pt called// Shortness of Breath// feeling fluid in abdomen has appt 08/27.Marland Kitchen

## 2013-02-27 NOTE — Telephone Encounter (Signed)
Keep appt, come sooner with PA if gets worse.

## 2013-02-27 NOTE — Telephone Encounter (Signed)
Pt advised. Pt states she does have some lower extremity edema and her baseline weight is 115lbs. She is asking if she can take lasix every day instead of every other day (yesterday was her lasix day) until her appt 03/01/13. I will forward to Dr Shirlee Latch.

## 2013-02-27 NOTE — Telephone Encounter (Signed)
Yes - that would be fine

## 2013-02-28 NOTE — Telephone Encounter (Signed)
Pt advised 02/27/13 this is OK.

## 2013-03-01 ENCOUNTER — Encounter: Payer: Self-pay | Admitting: Cardiology

## 2013-03-01 ENCOUNTER — Ambulatory Visit (INDEPENDENT_AMBULATORY_CARE_PROVIDER_SITE_OTHER): Payer: Managed Care, Other (non HMO) | Admitting: Cardiology

## 2013-03-01 VITALS — BP 96/62 | HR 65 | Ht 65.0 in | Wt 118.0 lb

## 2013-03-01 DIAGNOSIS — I5022 Chronic systolic (congestive) heart failure: Secondary | ICD-10-CM

## 2013-03-01 DIAGNOSIS — R1900 Intra-abdominal and pelvic swelling, mass and lump, unspecified site: Secondary | ICD-10-CM

## 2013-03-01 DIAGNOSIS — G709 Myoneural disorder, unspecified: Secondary | ICD-10-CM

## 2013-03-01 DIAGNOSIS — R0602 Shortness of breath: Secondary | ICD-10-CM

## 2013-03-01 DIAGNOSIS — I509 Heart failure, unspecified: Secondary | ICD-10-CM

## 2013-03-01 LAB — BASIC METABOLIC PANEL
BUN: 16 mg/dL (ref 6–23)
CO2: 30 mEq/L (ref 19–32)
Calcium: 9.7 mg/dL (ref 8.4–10.5)
Chloride: 101 mEq/L (ref 96–112)
Creatinine, Ser: 0.5 mg/dL (ref 0.4–1.2)
GFR: 152.83 mL/min (ref >60.00)
Glucose, Bld: 98 mg/dL (ref 70–99)
Potassium: 3.6 mEq/L (ref 3.5–5.1)
Sodium: 139 mEq/L (ref 135–145)

## 2013-03-01 LAB — BRAIN NATRIURETIC PEPTIDE: Pro B Natriuretic peptide (BNP): 17 pg/mL (ref 0.0–100.0)

## 2013-03-01 MED ORDER — BISOPROLOL FUMARATE 5 MG PO TABS: 5.0000 mg | ORAL_TABLET | Freq: Every day | ORAL | Status: DC

## 2013-03-01 MED ORDER — FUROSEMIDE 20 MG PO TABS: ORAL_TABLET | ORAL | Status: DC

## 2013-03-01 MED ORDER — POTASSIUM CHLORIDE ER 10 MEQ PO TBCR: 10.0000 meq | EXTENDED_RELEASE_TABLET | Freq: Every day | ORAL | Status: DC

## 2013-03-01 NOTE — Progress Notes (Signed)
Patient ID: [name], female   DOB: Jun 08, 1961, 52 y.o.   MRN: [id number] PCP: Dr. Corliss Blacker  52 yo with complicated past medical history presents for evaluation of her cardiomyopathy.  She has a long history of neuromuscular disorder.  The exact diagnosis remains undetermined.  She is confined to a wheelchair.  In 12/13, she was admitted to [facility] with hypotension, hypothermia, and altered mental status.   She was found to have profound anemia with hemoglobin down to 3.  She was transfused 4 units.  She also was found to have Enterococcal UTI and Serratia PNA.  She was intubated and eventually required tracheostomy.  During her hospitalization, she was found to have a cardiomyopathy with EF 20-25% by echo.  She was treated with milrinone.  She is now home again with her husband.  [name] is out.  She has a PEG tube.  Repeat echo in 5/14 showed EF improved to 50-55% with abnormal septal motion.   She had been doing quite well until the last month.  Lately, her ankles have been swelling and her abdomen has been swelling.  The abdominal swelling has been uncomfortable and has made her short of breath.  Weight is indeed up 4 lbs since last appointment.  She started taking her Lasix daily rather than every other day, and this has helped her swelling.  She is short of breath walking with her walker.  No palpitations or syncope.  No orthopnea/PND.  No chest pain.  Labs (1/14): K 3.9, creatinine 0.21, HCT 30 Labs (4/14): K 4, creatinine 0.4, BNP 19  ECG: NSR, rSR'  PMH: 1. Anorexia/cachexia/failure to thrive: Patient had a PEG tube.  2. Neuromuscular disease: exact diagnosis is still undetermined despite extensive neurology evaluation.  She does not have multiple sclerosis.  3. Fibromyalgia 4. IBS 5. TMJ syndrome 6. PTSD 7. GERD 8. Sacral decubitus ulcer 9. H/o aspiration 10. 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  Echo (12/13) with EF  20-25% with diffuse hypokinesis, mild MR, moderate RV dilation and moderate RV dysfunction.  Echo (5/14) with EF 50-55%, abnormal septal motion.  11. Anemia  SH: Lives with husband in [place].  Quit smoking > 20 years ago.  No ETOH.     FH: No history of cardiomyopathy.  Grandfather had MI at 64.   ROS: All systems reviewed and negative except as per HPI.   Current Outpatient Prescriptions  Medication Sig Dispense Refill  . Aluminum & Magnesium Hydroxide (MAG-AL PO) Take 250 mg by mouth.      . bisoprolol (ZEBETA) 5 MG tablet Take 1 tablet (5 mg total) by mouth daily.  30 tablet  6  . cholecalciferol (VITAMIN D) 1000 UNITS tablet Take 2,000 Units by mouth daily.       Marland Kitchen lactulose (CHRONULAC) 10 GM/15ML solution 30 g as directed.       Marland Kitchen lisinopril (PRINIVIL,ZESTRIL) 5 MG tablet 1/2 tablet (total 2.5mg ) daily  30 tablet  3  . Multiple Vitamins-Minerals (CEROVITE ADVANCED FORMULA) LIQD Take 15 mLs by mouth daily.      Marland Kitchen omeprazole (PRILOSEC) 20 MG capsule Take 20 mg by mouth 2 (two) times daily.      . Zinc 30 MG CAPS Take 1 capsule by mouth.      . furosemide (LASIX) 20 MG tablet Take 1 tablet (total 20mg ) daily alternating with 2 tablets (total 40mg ) daily  45 tablet  3  . potassium chloride (KLOR-CON 10) 10 MEQ tablet  Take 1 tablet (10 mEq total) by mouth daily.  30 tablet  6   No current facility-administered medications for this visit.    BP 96/62  Pulse 65  Ht 5\' 5"  (1.651 m)  Wt 53.524 kg (118 lb)  BMI 19.64 kg/m2  SpO2 95% General: NAD, thin.  Neck: No JVD, no thyromegaly or thyroid nodule.  Lungs: Clear to auscultation bilaterally with normal respiratory effort. CV: Nondisplaced PMI.  Heart regular S1/S2, no S3/S4, no murmur.  Trace ankle edema.  No carotid bruit.  Normal pedal pulses.  Abdomen: Soft, nontender, no hepatosplenomegaly, mildly distended.  Neurologic: Alert and oriented x 3.  Psych: Normal affect. Extremities: No clubbing or cyanosis.    Assessment/Plan: 1. Cardiomyopathy: Etiology uncertain.  Most likely nonischemic.  It is possible that this was a septic cardiomyopathy (form of stress cardiomyopathy) that could improve over time.  Cannot rule out cardiomyopathy related to malnutrition or high output heart failure in the setting of very low hemoglobin when she was in the hospital.  No exertional chest pain, no ECG changes to suggest CAD.  Most recent echo in 5/14 showed improvement in EF to 50-55%, so stress cardiomyopathy seems most likely.  Today, patient report increased dyspnea and swelling.  Weight is up.  JVP does not appear elevated but abdomen is mildly distended.  Symptoms have been improving since she increased Lasix to 20 mg daily, and she has minimal ankle edema.  Could this be heart failure related versus a liver issue versus malnutrition/low albumen/low oncotic presssure?  - She can try alternating Lasix 40 mg daily with Lasix 20 mg daily.  Needs to take KCl 10 mEq daily.  - Continue bisoprolol and lisinopril. - Limited echo to make sure EF remains near-normal. - Abdominal US to assess liver and look for ascites.   - BMET/BNP today and in 10 days.  2. Neuromuscular disorder: Not fully characterized.  She is in a wheelchair most of the time.  Marca Ancona 03/01/2013

## 2013-03-01 NOTE — Patient Instructions (Addendum)
Increase lasix (furosemide) to 20mg  daily alternating with 40mg  daily.  Start KCL(potassium) 10 mEq daily.  Your physician recommends that you have  lab work today--BMET/BNP.  Your physician recommends that you return for lab work in: 10 days--BMET/BNP.  Your physician has requested that you have an echocardiogram. Echocardiography is a painless test that uses sound waves to create images of your heart. It provides your doctor with information about the size and shape of your heart and how well your heart's chambers and valves are working. This procedure takes approximately one hour. There are no restrictions for this procedure.   Schedule an appointment for an ultrasound of your abdomen.   Your physician recommends that you schedule a follow-up appointment in: 2 weeks with PA/NP.

## 2013-03-07 ENCOUNTER — Ambulatory Visit (HOSPITAL_COMMUNITY)
Admission: RE | Admit: 2013-03-07 | Discharge: 2013-03-07 | Disposition: A | Discharge status: Home / Self Care | Payer: Managed Care, Other (non HMO) | Source: Ambulatory Visit | Attending: Cardiology | Admitting: Cardiology

## 2013-03-07 DIAGNOSIS — I5022 Chronic systolic (congestive) heart failure: Secondary | ICD-10-CM

## 2013-03-07 DIAGNOSIS — R109 Unspecified abdominal pain: Secondary | ICD-10-CM | POA: Insufficient documentation

## 2013-03-07 DIAGNOSIS — R0602 Shortness of breath: Secondary | ICD-10-CM

## 2013-03-07 DIAGNOSIS — R1900 Intra-abdominal and pelvic swelling, mass and lump, unspecified site: Secondary | ICD-10-CM

## 2013-03-10 ENCOUNTER — Other Ambulatory Visit: Payer: Self-pay

## 2013-03-10 ENCOUNTER — Ambulatory Visit (HOSPITAL_COMMUNITY): Payer: Managed Care, Other (non HMO) | Attending: Cardiology | Admitting: Radiology

## 2013-03-10 ENCOUNTER — Other Ambulatory Visit (INDEPENDENT_AMBULATORY_CARE_PROVIDER_SITE_OTHER): Payer: Managed Care, Other (non HMO)

## 2013-03-10 DIAGNOSIS — R1900 Intra-abdominal and pelvic swelling, mass and lump, unspecified site: Secondary | ICD-10-CM

## 2013-03-10 DIAGNOSIS — I509 Heart failure, unspecified: Secondary | ICD-10-CM

## 2013-03-10 DIAGNOSIS — G709 Myoneural disorder, unspecified: Secondary | ICD-10-CM | POA: Insufficient documentation

## 2013-03-10 DIAGNOSIS — R0989 Other specified symptoms and signs involving the circulatory and respiratory systems: Secondary | ICD-10-CM | POA: Insufficient documentation

## 2013-03-10 DIAGNOSIS — I5022 Chronic systolic (congestive) heart failure: Secondary | ICD-10-CM

## 2013-03-10 DIAGNOSIS — I059 Rheumatic mitral valve disease, unspecified: Secondary | ICD-10-CM | POA: Insufficient documentation

## 2013-03-10 DIAGNOSIS — I428 Other cardiomyopathies: Secondary | ICD-10-CM | POA: Insufficient documentation

## 2013-03-10 DIAGNOSIS — R0602 Shortness of breath: Secondary | ICD-10-CM

## 2013-03-10 DIAGNOSIS — R0609 Other forms of dyspnea: Secondary | ICD-10-CM | POA: Insufficient documentation

## 2013-03-10 DIAGNOSIS — I079 Rheumatic tricuspid valve disease, unspecified: Secondary | ICD-10-CM | POA: Insufficient documentation

## 2013-03-10 DIAGNOSIS — R609 Edema, unspecified: Secondary | ICD-10-CM | POA: Insufficient documentation

## 2013-03-10 LAB — BASIC METABOLIC PANEL
BUN: 18 mg/dL (ref 6–23)
CO2: 32 mEq/L (ref 19–32)
Calcium: 9.4 mg/dL (ref 8.4–10.5)
Chloride: 100 mEq/L (ref 96–112)
Creatinine, Ser: 0.4 mg/dL (ref 0.4–1.2)
GFR: 165.18 mL/min (ref >60.00)
Glucose, Bld: 95 mg/dL (ref 70–99)
Potassium: 3.8 mEq/L (ref 3.5–5.1)
Sodium: 137 mEq/L (ref 135–145)

## 2013-03-10 LAB — BRAIN NATRIURETIC PEPTIDE: Pro B Natriuretic peptide (BNP): 16 pg/mL (ref 0.0–100.0)

## 2013-03-10 NOTE — Progress Notes (Signed)
Echocardiogram performed.  

## 2013-03-21 ENCOUNTER — Encounter: Payer: Self-pay | Admitting: Nurse Practitioner

## 2013-03-21 ENCOUNTER — Ambulatory Visit (INDEPENDENT_AMBULATORY_CARE_PROVIDER_SITE_OTHER): Payer: Managed Care, Other (non HMO) | Admitting: Nurse Practitioner

## 2013-03-21 VITALS — BP 100/80 | HR 64 | Ht 65.5 in | Wt 114.4 lb

## 2013-03-21 DIAGNOSIS — I509 Heart failure, unspecified: Secondary | ICD-10-CM

## 2013-03-21 DIAGNOSIS — I5022 Chronic systolic (congestive) heart failure: Secondary | ICD-10-CM

## 2013-03-21 DIAGNOSIS — R0602 Shortness of breath: Secondary | ICD-10-CM

## 2013-03-21 DIAGNOSIS — R1900 Intra-abdominal and pelvic swelling, mass and lump, unspecified site: Secondary | ICD-10-CM

## 2013-03-21 LAB — BASIC METABOLIC PANEL
BUN: 18 mg/dL (ref 6–23)
CO2: 31 mEq/L (ref 19–32)
Calcium: 9.5 mg/dL (ref 8.4–10.5)
Chloride: 100 mEq/L (ref 96–112)
Creatinine, Ser: 0.5 mg/dL (ref 0.4–1.2)
GFR: 156.72 mL/min (ref >60.00)
Glucose, Bld: 100 mg/dL — ABNORMAL HIGH (ref 70–99)
Potassium: 3.5 mEq/L (ref 3.5–5.1)
Sodium: 138 mEq/L (ref 135–145)

## 2013-03-21 LAB — CBC WITH DIFFERENTIAL/PLATELET
Basophils Absolute: 0 10*3/uL (ref 0.0–0.1)
Basophils Relative: 0.4 % (ref 0.0–3.0)
Eosinophils Absolute: 0.3 10*3/uL (ref 0.0–0.7)
Eosinophils Relative: 6.6 % — ABNORMAL HIGH (ref 0.0–5.0)
HCT: 39.6 % (ref 36.0–46.0)
Hemoglobin: 13.1 g/dL (ref 12.0–15.0)
Lymphocytes Relative: 28.2 % (ref 12.0–46.0)
Lymphs Abs: 1.2 10*3/uL (ref 0.7–4.0)
MCHC: 33.1 g/dL (ref 30.0–36.0)
MCV: 86.7 fl (ref 78.0–100.0)
Monocytes Absolute: 0.4 10*3/uL (ref 0.1–1.0)
Monocytes Relative: 8.4 % (ref 3.0–12.0)
Neutro Abs: 2.4 10*3/uL (ref 1.4–7.7)
Neutrophils Relative %: 56.4 % (ref 43.0–77.0)
Platelets: 192 10*3/uL (ref 150.0–400.0)
RBC: 4.57 Mil/uL (ref 3.87–5.11)
RDW: 14.4 % (ref 11.5–14.6)
WBC: 4.2 10*3/uL — ABNORMAL LOW (ref 4.5–10.5)

## 2013-03-21 MED ORDER — FUROSEMIDE 20 MG PO TABS: ORAL_TABLET | ORAL | Status: DC

## 2013-03-21 NOTE — Progress Notes (Signed)
[name] Date of Birth: Oct 01, 1960 Medical Record #[id number]  History of Present Illness: [name] is seen back today for a 2 week check. Seen for Dr. Shirlee Latch. She has a known cardiomyopathy. Other issues include a long history of a neuromuscular disorder - exact diagnosis remains undetermined. She is confined to a wheelchair. Admitted back in December of 2013 with profound symptomatic anemia, UTI and PNA - requiring intubation/trach. EF then noted to be 20 to 25% per echo. Repeat echo from May of 2013 showed improvement - EF up to 50 to 55%.   Seen 2 weeks ago with swelling. Medicines were adjusted. Had a limited echo and abdominal US - these turned out ok.   Comes back today. Here with his wife. She feels much better. She has no complaint whatsoever. Weight is back down. Swelling has improved. No more shortness of breath reported. She notes that she feels better on the days that she takes the 40 mg of Lasix and she really wants to just take 40 mg a day. Not using salt. No chest pain. Belly feels better. Overall she is quite happy with how she is doing at this time.   Current Outpatient Prescriptions  Medication Sig Dispense Refill  . Aluminum & Magnesium Hydroxide (MAG-AL PO) Take 250 mg by mouth.      . bisoprolol (ZEBETA) 5 MG tablet Take 1 tablet (5 mg total) by mouth daily.  30 tablet  6  . cholecalciferol (VITAMIN D) 1000 UNITS tablet Take 2,000 Units by mouth daily.       Marland Kitchen lactulose (CHRONULAC) 10 GM/15ML solution 30 g as directed.       Marland Kitchen lisinopril (PRINIVIL,ZESTRIL) 5 MG tablet 1/2 tablet (total 2.5mg ) daily  30 tablet  3  . Multiple Vitamins-Minerals (CEROVITE ADVANCED FORMULA) LIQD Take 15 mLs by mouth daily.      Marland Kitchen omeprazole (PRILOSEC) 20 MG capsule Take 20 mg by mouth 2 (two) times daily.      . potassium chloride (KLOR-CON 10) 10 MEQ tablet Take 1 tablet (10 mEq total) by mouth daily.  30 tablet  6  . Zinc 30 MG CAPS Take 1 capsule by mouth.      . furosemide  (LASIX) 20 MG tablet May decrease dose based on weight  60 tablet  6   No current facility-administered medications for this visit.    Allergies  Allergen Reactions  . Scopolamine Anaphylaxis and Shortness Of Breath    Respiratory distress  . Ivp Dye [Iodinated Diagnostic Agents] Other (See Comments)    unknown  . Carvedilol Rash    PMH: 1. Anorexia/cachexia/failure to thrive: Patient had a PEG tube.  2. Neuromuscular disease: exact diagnosis is still undetermined despite extensive neurology evaluation. She does not have multiple sclerosis.  3. Fibromyalgia  4. IBS 5. TMJ syndrome  6. PTSD  7. GERD 8. Sacral decubitus ulcer  9. H/o aspiration  10. 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 Echo (12/13) with EF 20-25% with diffuse hypokinesis, mild MR, moderate RV dilation and moderate RV dysfunction. Echo (5/14) with EF 50-55%, abnormal septal motion.  11. Anemia    Past Medical History  Diagnosis Date  . MS (multiple sclerosis)   . IBS (irritable bowel syndrome)   . PTSD (post-traumatic stress disorder)   . Fibromyalgia syndrome   . TMJ disease   . Difficult intubation     secondary to jaw muscles  . Fibromyalgia   . GERD (gastroesophageal reflux  disease)   . Anorexia nervosa     h/o   . CAP (community acquired pneumonia)   . Anemia   . Shock circulatory   . Acute liver failure   . Cardiomyopathy   . Sacral decubitus ulcer     Past Surgical History  Procedure Laterality Date  . Cesarean section    . Bunionectomy    . Dilation and curettage of uterus  2007  . Tubal ligation    . Tracheostomy  06/2012    History  Smoking status  . Never Smoker   Smokeless tobacco  . Never Used    History  Alcohol Use No    No family history on file.  Review of Systems: The review of systems is per the HPI.  All other systems were reviewed and are negative.  Physical Exam: BP 100/80  Pulse 64  Ht 5'  5.5" (1.664 m)  Wt 114 lb 6.4 oz (51.891 kg)  BMI 18.74 kg/m2 Patient is very pleasant and in no acute distress. She is in a wheelchair. Weight is down 4 pounds. Skin is warm and dry. Color is normal.  HEENT is unremarkable but no hair and several missing teeth.  Normocephalic/atraumatic. PERRL. Sclera are nonicteric. Neck is supple. No masses. No JVD. Lungs are clear. Cardiac exam shows a regular rate and rhythm. Abdomen is soft. Extremities are with just trace edema. Gait not tested.  No gross neurologic deficits noted.  LABORATORY DATA: PENDING  Lab Results  Component Value Date   WBC 7.8 07/12/2012   HGB 9.4* 07/12/2012   HCT 30.0* 07/12/2012   PLT 402* 07/12/2012   GLUCOSE 95 03/10/2013   ALT 47* 06/12/2012   AST 58* 06/12/2012   NA 137 03/10/2013   K 3.8 03/10/2013   CL 100 03/10/2013   CREATININE 0.4 03/10/2013   BUN 18 03/10/2013   CO2 32 03/10/2013   TSH 3.997 06/13/2012   INR 1.29 06/19/2012    Echo Study Conclusions from September 2014  Left ventricle: The cavity size was normal. Wall thickness was normal. Systolic function was normal. The estimated ejection fraction was in the range of 55% to 60%. Wall motion was normal; there were no regional wall motion abnormalities.  IMPRESSION: Negative abdominal ultrasound.   Electronically Signed By: Charlett Nose On: 03/07/2013 12:31    Assessment / Plan: 1. Cardiomyopathy - etiology uncertain - EF has remained normal - not sure how to explain this recent exacerbation - she is feeling much better. Will recheck her labs today. Will let her stay on the 40 mg of Lasix as long as her labs look ok, but she is instructed in situations when to cut back or even hold. I will see her in a month. See Dr. Shirlee Latch in 3 months.   2. Anemia - does not look like she has had a recheck - will check CBC today.   3. Neuromuscular disorder - not understood.   Patient is agreeable to this plan and will call if any problems develop in the interim.   Rosalio Macadamia, RN, ANP-C [facility] [address] [place], [place]  [address]

## 2013-03-21 NOTE — Patient Instructions (Addendum)
We will check labs today  I will see you in a month and see Dr. Shirlee Latch in 3 months  You may stay on your current medicines including Lasix 40 mg a day - you may adjust your Lasix down if your weight continues to drop  Call the [facility] office at [phone] if you have any questions, problems or concerns.

## 2013-04-18 ENCOUNTER — Ambulatory Visit (INDEPENDENT_AMBULATORY_CARE_PROVIDER_SITE_OTHER): Payer: Managed Care, Other (non HMO) | Admitting: Nurse Practitioner

## 2013-04-18 ENCOUNTER — Encounter: Payer: Self-pay | Admitting: Nurse Practitioner

## 2013-04-18 VITALS — BP 90/62 | HR 64 | Ht 65.5 in | Wt 114.8 lb

## 2013-04-18 DIAGNOSIS — I428 Other cardiomyopathies: Secondary | ICD-10-CM

## 2013-04-18 DIAGNOSIS — I429 Cardiomyopathy, unspecified: Secondary | ICD-10-CM

## 2013-04-18 LAB — BASIC METABOLIC PANEL
BUN: 18 mg/dL (ref 6–23)
CO2: 29 mEq/L (ref 19–32)
Calcium: 9.7 mg/dL (ref 8.4–10.5)
Chloride: 100 mEq/L (ref 96–112)
Creatinine, Ser: 0.5 mg/dL (ref 0.4–1.2)
GFR: 152.74 mL/min (ref >60.00)
Glucose, Bld: 105 mg/dL — ABNORMAL HIGH (ref 70–99)
Potassium: 3.5 mEq/L (ref 3.5–5.1)
Sodium: 140 mEq/L (ref 135–145)

## 2013-04-18 NOTE — Progress Notes (Signed)
[name] Date of Birth: 1961/05/18 Medical Record #[id number]  History of Present Illness: [name] is seen back today for a one month check. Seen for Dr. Shirlee Latch. She has a known cardiomyopathy. Other issues include a long history of a neuromuscular disorder - exact diagnosis remains undetermined. She is confined to a wheelchair. Admitted back in December of 2013 with profound symptomatic anemia, UTI and PNA - requiring intubation/trach. EF then noted to be 20 to 25% per echo. Repeat echo from May of 2013 showed improvement - EF up to 50 to 55%.   Seen 6 weeks ago with swelling. Medicines were adjusted. Had a limited echo and abdominal US - these turned out ok.   I saw her a month ago. She was feeling much better with the higher dose of Lasix.   Comes back today. Here with her husband. Doing ok.  No swelling. Not short of breath. Has done a much better job of restricting her salt - apparently was using over 3000 mg a day - now down to less than a 1000. BP has been running lower at home. She will get dizzy and not feel good if BP is in the low 80's and has even been down to the 70's. Actually went to the zoo recently and had a good time.   Current Outpatient Prescriptions  Medication Sig Dispense Refill  . Aluminum & Magnesium Hydroxide (MAG-AL PO) Take 250 mg by mouth.      . bisoprolol (ZEBETA) 5 MG tablet Take 1 tablet (5 mg total) by mouth daily.  30 tablet  6  . cholecalciferol (VITAMIN D) 1000 UNITS tablet Take 2,000 Units by mouth daily.       . furosemide (LASIX) 20 MG tablet Take 40 mg by mouth daily. May decrease dose based on weight      . lactulose (CHRONULAC) 10 GM/15ML solution 30 g as directed.       Marland Kitchen lisinopril (PRINIVIL,ZESTRIL) 5 MG tablet 1/2 tablet (total 2.5mg ) daily  30 tablet  3  . Multiple Vitamins-Minerals (CEROVITE ADVANCED FORMULA) LIQD Take 15 mLs by mouth daily.      Marland Kitchen omeprazole (PRILOSEC) 20 MG capsule Take 20 mg by mouth 2 (two) times daily.        . potassium chloride (KLOR-CON 10) 10 MEQ tablet Take 1 tablet (10 mEq total) by mouth daily.  30 tablet  6  . Zinc 30 MG CAPS Take 1 capsule by mouth.       No current facility-administered medications for this visit.    Allergies  Allergen Reactions  . Scopolamine Anaphylaxis and Shortness Of Breath    Respiratory distress  . Ivp Dye [Iodinated Diagnostic Agents] Other (See Comments)    unknown  . Carvedilol Rash    PMH: 1. Anorexia/cachexia/failure to thrive: Patient had a PEG tube.  2. Neuromuscular disease: exact diagnosis is still undetermined despite extensive neurology evaluation. She does not have multiple sclerosis.  3. Fibromyalgia  4. IBS 5. TMJ syndrome  6. PTSD  7. GERD 8. Sacral decubitus ulcer  9. H/o aspiration  10. 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 Echo (12/13) with EF 20-25% with diffuse hypokinesis, mild MR, moderate RV dilation and moderate RV dysfunction. Echo (5/14) with EF 50-55%, abnormal septal motion.  11. Anemia    Past Medical History  Diagnosis Date  . MS (multiple sclerosis)   . IBS (irritable bowel syndrome)   . PTSD (post-traumatic stress disorder)   .  Fibromyalgia syndrome   . TMJ disease   . Difficult intubation     secondary to jaw muscles  . Fibromyalgia   . GERD (gastroesophageal reflux disease)   . Anorexia nervosa     h/o   . CAP (community acquired pneumonia)   . Anemia   . Shock circulatory   . Acute liver failure   . Cardiomyopathy   . Sacral decubitus ulcer     Past Surgical History  Procedure Laterality Date  . Cesarean section    . Bunionectomy    . Dilation and curettage of uterus  2007  . Tubal ligation    . Tracheostomy  06/2012    History  Smoking status  . Former Smoker  . Quit date: 07/07/1987  Smokeless tobacco  . Never Used    History  Alcohol Use No    History reviewed. No pertinent family history.  Review of  Systems: The review of systems is per the HPI.  All other systems were reviewed and are negative.  Physical Exam: BP 90/62  Pulse 64  Ht 5' 5.5" (1.664 m)  Wt 114 lb 12.8 oz (52.073 kg)  BMI 18.81 kg/m2 Patient is very pleasant and in no acute distress. Skin is warm and dry. Color is normal.  HEENT is unremarkable but has missing teeth noted and no hair. Normocephalic/atraumatic. PERRL. Sclera are nonicteric. Neck is supple. No masses. No JVD. Lungs are clear. Cardiac exam shows a regular rate and rhythm. Abdomen is soft. Extremities are without edema. Gait and ROM are intact. No gross neurologic deficits noted.  LABORATORY DATA: BMET is pending  Lab Results  Component Value Date   WBC 4.2* 03/21/2013   HGB 13.1 03/21/2013   HCT 39.6 03/21/2013   PLT 192.0 03/21/2013   GLUCOSE 100* 03/21/2013   ALT 47* 06/12/2012   AST 58* 06/12/2012   NA 138 03/21/2013   K 3.5 03/21/2013   CL 100 03/21/2013   CREATININE 0.5 03/21/2013   BUN 18 03/21/2013   CO2 31 03/21/2013   TSH 3.997 06/13/2012   INR 1.29 06/19/2012     Assessment / Plan: 1. Cardiomyopathy - etiology uncertain - now with normal EF - will leave her on her current regimen. She is instructed to cut the diuretic back or hold altogether when BP is low and she is symptomatic. Continue with her salt restriction.   2. Anemia - improved on last labs done  3. Neuromuscular disorder - not understood or defined - she is able to ambulate with a walker at home.   We will recheck BMET today. She is seeing Dr. Shirlee Latch next month.   Patient is agreeable to this plan and will call if any problems develop in the interim.   Rosalio Macadamia, RN, ANP-C [facility] [address] [place], [place]  [address]

## 2013-04-18 NOTE — Patient Instructions (Signed)
Stay on your current medicines but adjust your lasix down as we have discussed today in regards to your blood pressure  We will check labs today  See Dr. Shirlee Latch in December as planned  Call the [facility] office at [phone] if you have any questions, problems or concerns.

## 2013-05-11 ENCOUNTER — Other Ambulatory Visit: Payer: Self-pay

## 2013-05-24 ENCOUNTER — Telehealth: Payer: Self-pay | Admitting: Cardiology

## 2013-05-24 NOTE — Telephone Encounter (Signed)
Spoke with patient and she stated her blood pressure had been going up, systolic in upper 90's and she normally run in low 90's. Patient wanted to increase her Lisinopril back to 5 mg daily since she feels better with systolic blood pressure in low 90's. Discussed with Dawayne Patricia NP who saw patient last and she recommended patient keep medications as she taking, felt greater risk for fall if drop in blood pressure when she stands if meds increased.  Advised patient, verbalized understanding.  She will continue to monitor blood pressure at home and call back if continues to go up. Will discuss this with Dr Shirlee Latch at her follow up ov in December.

## 2013-05-24 NOTE — Telephone Encounter (Signed)
New Problem   Pt requests to increase the dosage of the lisinopril/// Please call back to discuss further

## 2013-06-21 ENCOUNTER — Encounter: Payer: Self-pay | Admitting: Cardiology

## 2013-06-21 ENCOUNTER — Ambulatory Visit (INDEPENDENT_AMBULATORY_CARE_PROVIDER_SITE_OTHER): Payer: Managed Care, Other (non HMO) | Admitting: Cardiology

## 2013-06-21 VITALS — BP 94/66 | HR 64 | Ht 65.5 in | Wt 112.0 lb

## 2013-06-21 DIAGNOSIS — I5022 Chronic systolic (congestive) heart failure: Secondary | ICD-10-CM

## 2013-06-21 DIAGNOSIS — R1900 Intra-abdominal and pelvic swelling, mass and lump, unspecified site: Secondary | ICD-10-CM

## 2013-06-21 DIAGNOSIS — I509 Heart failure, unspecified: Secondary | ICD-10-CM

## 2013-06-21 DIAGNOSIS — R0602 Shortness of breath: Secondary | ICD-10-CM

## 2013-06-21 LAB — BASIC METABOLIC PANEL
BUN: 12 mg/dL (ref 6–23)
CO2: 31 mEq/L (ref 19–32)
Calcium: 9.4 mg/dL (ref 8.4–10.5)
Chloride: 101 mEq/L (ref 96–112)
Creatinine, Ser: 0.4 mg/dL (ref 0.4–1.2)
GFR: 190.29 mL/min (ref >60.00)
Glucose, Bld: 117 mg/dL — ABNORMAL HIGH (ref 70–99)
Potassium: 3.6 mEq/L (ref 3.5–5.1)
Sodium: 140 mEq/L (ref 135–145)

## 2013-06-21 LAB — MAGNESIUM: Magnesium: 2.1 mg/dL (ref 1.5–2.5)

## 2013-06-21 MED ORDER — LISINOPRIL 5 MG PO TABS: ORAL_TABLET | ORAL | Status: DC

## 2013-06-21 MED ORDER — FUROSEMIDE 20 MG PO TABS: 40.0000 mg | ORAL_TABLET | Freq: Every day | ORAL | Status: DC

## 2013-06-21 MED ORDER — POTASSIUM CHLORIDE ER 10 MEQ PO TBCR: 10.0000 meq | EXTENDED_RELEASE_TABLET | Freq: Every day | ORAL | Status: DC

## 2013-06-21 MED ORDER — BISOPROLOL FUMARATE 5 MG PO TABS: 5.0000 mg | ORAL_TABLET | Freq: Every day | ORAL | Status: DC

## 2013-06-21 NOTE — Patient Instructions (Addendum)
Your physician wants you to follow-up in: 6 months with Dr Shirlee Latch in the [facility]. (June 2015). You will receive a reminder letter in the mail two months in advance. If you don't receive a letter, please call our office to schedule the follow-up appointment.   Your physician recommends that you have lab work today--BMET/Magnesium

## 2013-06-21 NOTE — Progress Notes (Signed)
Patient ID: [name], female   DOB: Nov 15, 1960, 52 y.o.   MRN: [id number] PCP: Dr. Corliss Blacker  52 yo with complicated past medical history presents for evaluation of her cardiomyopathy.  She has a long history of neuromuscular disorder.  The exact diagnosis remains undetermined.  She is confined to a wheelchair.  In 12/13, she was admitted to [facility] with hypotension, hypothermia, and altered mental status.   She was found to have profound anemia with hemoglobin down to 3.  She was transfused 4 units.  She also was found to have Enterococcal UTI and Serratia PNA.  She was intubated and eventually required tracheostomy.  During her hospitalization, she was found to have a cardiomyopathy with EF 20-25% by echo.  She was treated with milrinone.  She is now home again with her husband.  [name] is out.  She has a PEG tube.  Repeat echo in 5/14 showed EF improved to 50-55% with abnormal septal motion and last echo in 9/14 showed EF 55-60%.  .   Patient is doing well now on higher Lasix dose (40 mg daily).  Weight is down 2 lbs.  She has cut back a lot on sodium intake. She is able to walk with her walker without exertional dyspnea.  SBP runs 85-100 at home.  She is asymptomatic when BP is in the 80s.  Occasional palpitations.   Labs (1/14): K 3.9, creatinine 0.21, HCT 30 Labs (4/14): K 4, creatinine 0.4, BNP 19 Labs (10/14): K 3.5, creatinine 0.5  ECG: NSR, rSR'  PMH: 1. Anorexia/cachexia/failure to thrive: Patient had a PEG tube.  2. Neuromuscular disease: exact diagnosis is still undetermined despite extensive neurology evaluation.  She does not have multiple sclerosis.  3. Fibromyalgia 4. IBS 5. TMJ syndrome 6. PTSD 7. GERD 8. Sacral decubitus ulcer 9. H/o aspiration 10. 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  Echo (12/13) with EF 20-25% with diffuse hypokinesis, mild MR, moderate RV dilation and moderate RV dysfunction.  Echo  (5/14) with EF 50-55%, abnormal septal motion.  Echo (9/14) with EF 55-60%.   11. Anemia  SH: Lives with husband in [place].  Quit smoking > 20 years ago.  No ETOH.     FH: No history of cardiomyopathy.  Grandfather had MI at 43.    Current Outpatient Prescriptions  Medication Sig Dispense Refill  . Aluminum & Magnesium Hydroxide (MAG-AL PO) Take 250 mg by mouth.      . bisoprolol (ZEBETA) 5 MG tablet Take 1 tablet (5 mg total) by mouth daily.  30 tablet  6  . cholecalciferol (VITAMIN D) 1000 UNITS tablet Take 2,000 Units by mouth daily.       . furosemide (LASIX) 20 MG tablet Take 2 tablets (40 mg total) by mouth daily. May decrease dose based on weight  60 tablet  6  . lactulose (CHRONULAC) 10 GM/15ML solution 30 g as directed.       Marland Kitchen lisinopril (PRINIVIL,ZESTRIL) 5 MG tablet 1/2 tablet (total 2.5mg ) daily  15 tablet  6  . Multiple Vitamins-Minerals (CEROVITE ADVANCED FORMULA) LIQD Take 15 mLs by mouth daily.      Marland Kitchen omeprazole (PRILOSEC) 20 MG capsule Take 20 mg by mouth 2 (two) times daily.      . potassium chloride (KLOR-CON 10) 10 MEQ tablet Take 1 tablet (10 mEq total) by mouth daily.  30 tablet  6  . Zinc 30 MG CAPS Take 1 capsule by mouth.  No current facility-administered medications for this visit.    BP 94/66  Pulse 64  Ht 5' 5.5" (1.664 m)  Wt 50.803 kg (112 lb)  BMI 18.35 kg/m2 General: NAD, thin.  Neck: No JVD, no thyromegaly or thyroid nodule.  Lungs: Clear to auscultation bilaterally with normal respiratory effort. CV: Nondisplaced PMI.  Heart regular S1/S2, no S3/S4, no murmur.  Trace ankle edema.  No carotid bruit.  Normal pedal pulses.  Abdomen: Soft, nontender, no hepatosplenomegaly, mildly distended.  Neurologic: Alert and oriented x 3.  Psych: Normal affect. Extremities: No clubbing or cyanosis.   Assessment/Plan: 1. Cardiomyopathy: Etiology uncertain.  Most likely nonischemic.  It is possible that this was a septic cardiomyopathy (form of stress  cardiomyopathy) that could improve over time.  Cannot rule out cardiomyopathy related to malnutrition or high output heart failure in the setting of very low hemoglobin when she was in the hospital.  No exertional chest pain, no ECG changes to suggest CAD.  Most recent echo in 5/14 showed improvement in EF to 55-60%, so stress cardiomyopathy seems most likely.  Symptoms are much improved since she increased Lasix to 40 mg daily.  She is not volume overloaded on exam.  - Continue Lasix 40 mg daily.  - Continue bisoprolol and lisinopril. - BMET/BNP today  2. Neuromuscular disorder: Not fully characterized.  She is in a wheelchair most of the time but can walk with a walker.  Marca Ancona 06/21/2013

## 2013-07-12 ENCOUNTER — Telehealth (HOSPITAL_COMMUNITY): Payer: Self-pay | Admitting: Cardiology

## 2013-07-12 NOTE — Telephone Encounter (Signed)
Pt states she was not completely honest regarding symptoms during last ov 06/21/13 with Dr. Shirlee Latch Pt is currently c/o increased SOB, decreased fatigue, abdominal swelling, dry cough, fluttering in chest (? A-fib), also states she is not urinating as much as she should be with the amount of diuretics she is taking.   Please note pt would ONLY like to see Dr. Shirlee Latch  Please advise

## 2013-07-12 NOTE — Telephone Encounter (Signed)
Discussed w/Dr Shirlee Latch, he states he is ok to see pt on Tue, he feels like pt can wait till then, spoke w/pt she is agreeable for Tue appt sch for 2:40, if symptoms worsen before then she will call us back

## 2013-07-18 ENCOUNTER — Ambulatory Visit (HOSPITAL_COMMUNITY)
Admission: RE | Admit: 2013-07-18 | Discharge: 2013-07-18 | Disposition: A | Discharge status: Home / Self Care | Payer: Managed Care, Other (non HMO) | Source: Ambulatory Visit | Attending: Cardiology | Admitting: Cardiology

## 2013-07-18 VITALS — BP 98/60 | HR 62 | Ht 65.5 in | Wt 114.4 lb

## 2013-07-18 DIAGNOSIS — K219 Gastro-esophageal reflux disease without esophagitis: Secondary | ICD-10-CM | POA: Insufficient documentation

## 2013-07-18 DIAGNOSIS — I509 Heart failure, unspecified: Secondary | ICD-10-CM

## 2013-07-18 DIAGNOSIS — R1900 Intra-abdominal and pelvic swelling, mass and lump, unspecified site: Secondary | ICD-10-CM

## 2013-07-18 DIAGNOSIS — G709 Myoneural disorder, unspecified: Secondary | ICD-10-CM | POA: Insufficient documentation

## 2013-07-18 DIAGNOSIS — Z87891 Personal history of nicotine dependence: Secondary | ICD-10-CM | POA: Insufficient documentation

## 2013-07-18 DIAGNOSIS — R002 Palpitations: Secondary | ICD-10-CM

## 2013-07-18 DIAGNOSIS — R0602 Shortness of breath: Secondary | ICD-10-CM

## 2013-07-18 DIAGNOSIS — Z931 Gastrostomy status: Secondary | ICD-10-CM | POA: Insufficient documentation

## 2013-07-18 DIAGNOSIS — I5022 Chronic systolic (congestive) heart failure: Secondary | ICD-10-CM

## 2013-07-18 DIAGNOSIS — I428 Other cardiomyopathies: Secondary | ICD-10-CM | POA: Insufficient documentation

## 2013-07-18 DIAGNOSIS — I10 Essential (primary) hypertension: Secondary | ICD-10-CM | POA: Insufficient documentation

## 2013-07-18 DIAGNOSIS — Z79899 Other long term (current) drug therapy: Secondary | ICD-10-CM | POA: Insufficient documentation

## 2013-07-18 DIAGNOSIS — IMO0001 Reserved for inherently not codable concepts without codable children: Secondary | ICD-10-CM | POA: Insufficient documentation

## 2013-07-18 MED ORDER — FUROSEMIDE 20 MG PO TABS: ORAL_TABLET | ORAL | Status: DC

## 2013-07-18 MED ORDER — POTASSIUM CHLORIDE ER 10 MEQ PO TBCR: 20.0000 meq | EXTENDED_RELEASE_TABLET | Freq: Every day | ORAL | Status: DC

## 2013-07-18 NOTE — Patient Instructions (Signed)
Increase lasix (furosemide) to 2 tablets in the morning (40 mg) and 1 tablet in the afternoon (20mg )  Increase potassium to 2 tablets daily (20mg )  Labs in 1 week at [facility] office  Your physician has recommended that you wear an event monitor. Event monitors are medical devices that record the heart's electrical activity. Doctors most often [place] these monitors to diagnose arrhythmias. Arrhythmias are problems with the speed or rhythm of the heartbeat. The monitor is a small, portable device. You can wear one while you do your normal daily activities. This is usually used to diagnose what is causing palpitations/syncope (passing out).  Follow up in 1 month

## 2013-07-18 NOTE — Progress Notes (Signed)
Patient ID: [name], female   DOB: [birth year], 53 y.o.   MRN: [id number] PCP: Dr. Corliss Blacker  53 yo with complicated past medical history presents for evaluation of her cardiomyopathy.  She has a long history of neuromuscular disorder.  The exact diagnosis remains undetermined.  She is confined to a wheelchair.  In 12/13, she was admitted to [facility] with hypotension, hypothermia, and altered mental status.   She was found to have profound anemia with hemoglobin down to 3.  She was transfused 4 units.  She also was found to have Enterococcal UTI and Serratia PNA.  She was intubated and eventually required tracheostomy.  During her hospitalization, she was found to have a cardiomyopathy with EF 20-25% by echo.  She was treated with milrinone.  She is now home again with her husband.  [name] is out.  She has a PEG tube.  Repeat echo in 5/14 showed EF improved to 50-55% with abnormal septal motion and last echo in 9/14 showed EF 55-60%.  .   Patient is back today because of increased dyspnea.  Weight is up 2 lbs.  She reports dyspnea when walking any with her walker.  She finds it harder to lie flat due to dyspnea.  She does not have significant lower extremity edema but does have some abdominal distention.  She feels like Lasix is not as effective.  She has also been having runs of tachypalpitations.  No lightheadedness.   Labs (1/14): K 3.9, creatinine 0.21, HCT 30 Labs (4/14): K 4, creatinine 0.4, BNP 19 Labs (10/14): K 3.5, creatinine 0.5 Labs (12/14): K 3.6, creatinine 0.4  ECG: NSR, rSR'  PMH: 1. Anorexia/cachexia/failure to thrive: Patient had a PEG tube.  2. Neuromuscular disease: exact diagnosis is still undetermined despite extensive neurology evaluation.  She does not have multiple sclerosis.  3. Fibromyalgia 4. IBS 5. TMJ syndrome 6. PTSD 7. GERD 8. Sacral decubitus ulcer 9. H/o aspiration 10. 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  Echo (12/13) with EF 20-25% with diffuse hypokinesis, mild MR, moderate RV dilation and moderate RV dysfunction.  Echo (5/14) with EF 50-55%, abnormal septal motion.  Echo (9/14) with EF 55-60%.   11. Anemia  SH: Lives with husband in [place].  Quit smoking > 20 years ago.  No ETOH.     FH: No history of cardiomyopathy.  Grandfather had MI at 32.   ROS: All systems reviewed and negative except as per HPI.    Current Outpatient Prescriptions  Medication Sig Dispense Refill  . Aluminum & Magnesium Hydroxide (MAG-AL PO) Take 250 mg by mouth.      . bisoprolol (ZEBETA) 5 MG tablet Take 1 tablet (5 mg total) by mouth daily.  30 tablet  6  . cholecalciferol (VITAMIN D) 1000 UNITS tablet Take 2,000 Units by mouth daily.       . furosemide (LASIX) 20 MG tablet Take 2 tablets in the am, take 1 tablet pm  90 tablet  6  . lactulose (CHRONULAC) 10 GM/15ML solution 30 g as directed.       Marland Kitchen lisinopril (PRINIVIL,ZESTRIL) 5 MG tablet 1/2 tablet (total 2.5mg ) daily  15 tablet  6  . Multiple Vitamins-Minerals (CEROVITE ADVANCED FORMULA) LIQD Take 15 mLs by mouth daily.      Marland Kitchen omeprazole (PRILOSEC) 20 MG capsule Take 20 mg by mouth 2 (two) times daily.      . potassium chloride (KLOR-CON 10) 10 MEQ tablet Take 2 tablets (  20 mEq total) by mouth daily.  60 tablet  6  . Zinc 30 MG CAPS Take 1 capsule by mouth.       No current facility-administered medications for this encounter.    BP 98/60  Pulse 62  Ht 5' 5.5" (1.664 m)  Wt 114 lb 6.4 oz (51.891 kg)  BMI 18.74 kg/m2  SpO2 100% General: NAD, thin.  Neck: No JVD, no thyromegaly or thyroid nodule.  Lungs: Clear to auscultation bilaterally with normal respiratory effort. CV: Nondisplaced PMI.  Heart regular S1/S2, no S3/S4, no murmur.  Trace ankle edema.  No carotid bruit.  Normal pedal pulses.  Abdomen: Soft, nontender, no hepatosplenomegaly, mildly distended.  Neurologic: Alert and oriented x 3.  Psych: Normal  affect. Extremities: No clubbing or cyanosis.   Assessment/Plan: 1. Cardiomyopathy: Etiology uncertain.  Most likely nonischemic.  It is possible that this was a septic cardiomyopathy (form of stress cardiomyopathy) that could improve over time.  Cannot rule out cardiomyopathy related to malnutrition or high output heart failure in the setting of very low hemoglobin when she was in the hospital.  No exertional chest pain, no ECG changes to suggest CAD.  Most recent echo in 5/14 showed improvement in EF to 55-60%, so stress cardiomyopathy seems most likely.  Symptoms now seem worse, NYHA class III.  She does not appear particularly volume overloaded, however.   - CXR to make sure she has not had recurrence of pleural effusion.  - Continue bisoprolol and lisinopril. - We talked about RHC today to get a better idea of her volume status.  She wants to hold off on this.  I will empirically increase Lasix to 40 qam, 20 qpm and KCl to 20 daily.  Will get BMET in 1 week.  I will see her back in 1 month and if dyspnea does not improve, will plan RHC.  2. Neuromuscular disorder: Not fully characterized.  She is in a wheelchair most of the time but can walk with a walker. 3. Palpitations: She has bothersome runs of tachypalpitations.  They do not occur every day.  I will get an event monitor.   Marca Ancona 07/18/2013

## 2013-07-21 ENCOUNTER — Telehealth (HOSPITAL_COMMUNITY): Payer: Self-pay | Admitting: Cardiology

## 2013-07-21 DIAGNOSIS — J449 Chronic obstructive pulmonary disease, unspecified: Secondary | ICD-10-CM

## 2013-07-21 NOTE — Telephone Encounter (Signed)
Reviewed CXR results with pt. pts response regarding heavy smoking- YES  If provider has plans to refer to Pulm, please refer to Dr. Koren Bound, this MD took care of her in the past

## 2013-07-21 NOTE — Telephone Encounter (Signed)
Let's have her start with getting PFTs to assess for severity of COPD.

## 2013-07-26 NOTE — Telephone Encounter (Signed)
Pt aware Order placed Pt scheduled  tues 08/01/13 @ 10 am

## 2013-07-28 ENCOUNTER — Encounter: Payer: Self-pay | Admitting: Radiology

## 2013-07-28 ENCOUNTER — Other Ambulatory Visit: Payer: Managed Care, Other (non HMO)

## 2013-07-28 ENCOUNTER — Encounter (INDEPENDENT_AMBULATORY_CARE_PROVIDER_SITE_OTHER): Payer: Managed Care, Other (non HMO)

## 2013-07-28 DIAGNOSIS — R002 Palpitations: Secondary | ICD-10-CM

## 2013-07-28 DIAGNOSIS — R0602 Shortness of breath: Secondary | ICD-10-CM

## 2013-07-28 LAB — BASIC METABOLIC PANEL
BUN: 13 mg/dL (ref 6–23)
CO2: 32 mEq/L (ref 19–32)
Calcium: 9.5 mg/dL (ref 8.4–10.5)
Chloride: 99 mEq/L (ref 96–112)
Creatinine, Ser: 0.5 mg/dL (ref 0.4–1.2)
GFR: 129.56 mL/min (ref >60.00)
Glucose, Bld: 97 mg/dL (ref 70–99)
Potassium: 3.5 mEq/L (ref 3.5–5.1)
Sodium: 139 mEq/L (ref 135–145)

## 2013-07-28 NOTE — Progress Notes (Signed)
Patient ID: [name], female   DOB: 1961/01/09, 53 y.o.   MRN: [id number] LIfewatch 21 day monitor applied

## 2013-08-01 ENCOUNTER — Encounter (HOSPITAL_COMMUNITY): Payer: Managed Care, Other (non HMO)

## 2013-08-01 ENCOUNTER — Ambulatory Visit (HOSPITAL_COMMUNITY)
Admission: RE | Admit: 2013-08-01 | Discharge: 2013-08-01 | Disposition: A | Discharge status: Home / Self Care | Payer: Managed Care, Other (non HMO) | Source: Ambulatory Visit | Attending: Cardiology | Admitting: Cardiology

## 2013-08-01 DIAGNOSIS — J449 Chronic obstructive pulmonary disease, unspecified: Secondary | ICD-10-CM

## 2013-08-01 DIAGNOSIS — R0609 Other forms of dyspnea: Secondary | ICD-10-CM | POA: Insufficient documentation

## 2013-08-01 DIAGNOSIS — Z87891 Personal history of nicotine dependence: Secondary | ICD-10-CM | POA: Insufficient documentation

## 2013-08-01 DIAGNOSIS — R062 Wheezing: Secondary | ICD-10-CM | POA: Insufficient documentation

## 2013-08-01 DIAGNOSIS — J4489 Other specified chronic obstructive pulmonary disease: Secondary | ICD-10-CM | POA: Insufficient documentation

## 2013-08-01 DIAGNOSIS — R059 Cough, unspecified: Secondary | ICD-10-CM | POA: Insufficient documentation

## 2013-08-01 DIAGNOSIS — R05 Cough: Secondary | ICD-10-CM | POA: Insufficient documentation

## 2013-08-01 DIAGNOSIS — R0989 Other specified symptoms and signs involving the circulatory and respiratory systems: Secondary | ICD-10-CM | POA: Insufficient documentation

## 2013-08-01 LAB — PULMONARY FUNCTION TEST
FEF 25-75 Post: 0.92 L/sec
FEF 25-75 Pre: 0.65 L/sec
FEF2575-%Change-Post: 42 %
FEF2575-%Pred-Post: 32 %
FEF2575-%Pred-Pre: 22 %
FEV1-%Change-Post: 16 %
FEV1-%Pred-Post: 38 %
FEV1-%Pred-Pre: 33 %
FEV1-Post: 1.14 L
FEV1-Pre: 0.98 L
FEV1FVC-%Change-Post: 24 %
FEV1FVC-%Pred-Pre: 65 %
FEV6-%Change-Post: -12 %
FEV6-%Pred-Post: 45 %
FEV6-%Pred-Pre: 51 %
FEV6-Post: 1.63 L
FEV6-Pre: 1.87 L
FEV6FVC-%Pred-Post: 103 %
FEV6FVC-%Pred-Pre: 103 %
FVC-%Change-Post: -6 %
FVC-%Pred-Post: 47 %
FVC-%Pred-Pre: 50 %
FVC-Post: 1.75 L
FVC-Pre: 1.87 L
Post FEV1/FVC ratio: 65 %
Post FEV6/FVC ratio: 100 %
Pre FEV1/FVC ratio: 52 %
Pre FEV6/FVC Ratio: 100 %
RV % pred: 122 %
RV: 2.29 L
TLC % pred: 81 %
TLC: 4.33 L

## 2013-08-01 MED ORDER — LEVALBUTEROL HCL 0.63 MG/3ML IN NEBU
0.6300 mg | INHALATION_SOLUTION | Freq: Once | RESPIRATORY_TRACT | Status: AC
Start: 2013-08-01 — End: 2013-08-01
  Administered 2013-08-01: 0.63 mg via RESPIRATORY_TRACT
  Filled 2013-08-01: qty 3

## 2013-08-04 ENCOUNTER — Telehealth (HOSPITAL_COMMUNITY): Payer: Self-pay | Admitting: Cardiology

## 2013-08-04 DIAGNOSIS — R0602 Shortness of breath: Secondary | ICD-10-CM

## 2013-08-04 NOTE — Telephone Encounter (Signed)
Pt called to request PFT results, would like to know her COPD level/stage. Also would like to know what is her next step in her plan of care as she is still having SOB.  Please advise

## 2013-08-04 NOTE — Telephone Encounter (Signed)
Per Dr Shirlee Latch ?COPD refer pt to pulm, pt aware and request Dr Delton Coombes, appt sch for 2/12 at 11:15 pt aware

## 2013-08-06 NOTE — Telephone Encounter (Signed)
Would have her see pulmonary for full lung evaluation.  Inhalers may help.

## 2013-08-17 ENCOUNTER — Encounter: Payer: Self-pay | Admitting: Internal Medicine

## 2013-08-17 ENCOUNTER — Ambulatory Visit (INDEPENDENT_AMBULATORY_CARE_PROVIDER_SITE_OTHER): Payer: Managed Care, Other (non HMO) | Admitting: Internal Medicine

## 2013-08-17 ENCOUNTER — Institutional Professional Consult (permissible substitution): Payer: Managed Care, Other (non HMO) | Admitting: Emergency Medicine

## 2013-08-17 VITALS — BP 102/60 | HR 82 | Temp 97.8°F | Ht 65.5 in | Wt 113.0 lb

## 2013-08-17 DIAGNOSIS — R0600 Dyspnea, unspecified: Secondary | ICD-10-CM

## 2013-08-17 DIAGNOSIS — I509 Heart failure, unspecified: Secondary | ICD-10-CM

## 2013-08-17 DIAGNOSIS — R0989 Other specified symptoms and signs involving the circulatory and respiratory systems: Secondary | ICD-10-CM

## 2013-08-17 DIAGNOSIS — I5022 Chronic systolic (congestive) heart failure: Secondary | ICD-10-CM

## 2013-08-17 DIAGNOSIS — R0609 Other forms of dyspnea: Secondary | ICD-10-CM

## 2013-08-17 MED ORDER — IRBESARTAN 75 MG PO TABS: 75.0000 mg | ORAL_TABLET | Freq: Every day | ORAL | Status: DC

## 2013-08-17 NOTE — Progress Notes (Signed)
Subjective:    Patient ID: [name], female    DOB: 1961/05/25   MRN: [id number]  HPI  52 yowf quit smoking 1989 then ? MS dx'd early 90's and "it's been steadily downhill since" mostly muscle related but since 2012 also with breathing > admit:  Admit date: 06/06/2012  Discharge date: 06/20/2012  Discharge Diagnoses:  Acute respiratory failure  CAP (community acquired pneumonia) vs aspiration pneumonia  Acute encephalopathy, resolved  Neuromuscular disorder: not fully defined.  Anemia  Thrombocytopenia  Coagulopathy  Acute liver failure  Enterococcal UTI (pansensitive)  Shock circulatory, suspect primarily cardiogenic  Sacral Pressure Ulcer  Dilated cardiomyopathy  Malnutrition compromising bodily function  H/O anorexia nervosa  Dysphagia      08/17/2013 1st [facility] office visit/ [name] was able do some adl/s with walker and no 02 summer of 2014  then downhill since  And now sobat rest  X 2 weeks and able to lie down at 45 degrees and can't tol lying flat at all for sev weeks assoc with dry cough day and night and dysphagia/ choking sensation on acei.  Already on gerd rx/ inhalers in past no benefit.   No obvious day to day or daytime variabilty or assoc excess mucus production or cp or chest tightness, subjective wheeze overt sinus or hb symptoms. No unusual exp hx or h/o childhood pna/ asthma or knowledge of premature birth.  Sleeping ok at 45 degreees without nocturnal  or early am exacerbation  of respiratory  c/o's or need for noct saba. Also denies any obvious fluctuation of symptoms with weather or environmental changes or other aggravating or alleviating factors except as outlined above   Current Medications, Allergies, Complete Past Medical History, Past Surgical History, Family History, and Social History were reviewed in [employer] record.        Review of Systems  Constitutional: Negative for fever, chills and  unexpected weight change.  HENT: Positive for dental problem and trouble swallowing. Negative for congestion, ear pain, nosebleeds, postnasal drip, rhinorrhea, sinus pressure, sneezing, sore throat and voice change.   Eyes: Negative for visual disturbance.  Respiratory: Positive for shortness of breath. Negative for cough and choking.   Cardiovascular: Negative for chest pain and leg swelling.  Gastrointestinal: Negative for vomiting, abdominal pain and diarrhea.  Genitourinary: Negative for difficulty urinating.  Musculoskeletal: Positive for arthralgias.  Skin: Negative for rash.  Neurological: Negative for tremors, syncope and headaches.  Hematological: Does not bruise/bleed easily.       Objective:   Physical Exam  Chronically ill thin w/c bound wf with classic voice fatigue and pseudowheeze  Wt Readings from Last 3 Encounters:  08/17/13 113 lb (51.256 kg)  07/18/13 114 lb 6.4 oz (51.891 kg)  06/21/13 112 lb (50.803 kg)      HEENT: nl dentition, turbinates, and orophanx. Nl external ear canals without cough reflex   NECK :  without JVD/Nodes/TM/ nl carotid upstrokes bilaterally   LUNGS: no acc muscle use, clear to A and P bilaterally without cough on insp or exp maneuvers   CV:  RRR  no s3 or murmur or increase in P2, no edema   ABD:  soft and nontender with nl excursion in the supine position. No bruits or organomegaly, bowel sounds nl  MS:  warm without deformities, calf tenderness, cyanosis or clubbing  SKIN: warm and dry without lesions    NEURO:  alert, approp, gen weakness     cxr 07/18/13  The heart size and mediastinal contours are within normal limits.  Lungs are hyperexpanded. Mild scarring is noted in the upper lobes.  The lungs are otherwise clear. No pleural effusion or pneumothorax.  The bony thorax is intact.  IMPRESSION:  No acute cardiopulmonary disease. Lung hyperexpansion suggests COPD     Assessment & Plan:

## 2013-08-17 NOTE — Patient Instructions (Addendum)
Stop lisinopril Avapro 75 mg one daily  Prilosec 20 mg Take 30- 60 min before your first and last meals of the day   GERD (REFLUX)  is an extremely common cause of respiratory symptoms, many times with no significant heartburn at all.    It can be treated with medication, but also with lifestyle changes including avoidance of late meals, excessive alcohol, smoking cessation, and avoid fatty foods, chocolate, peppermint, colas, red wine, and acidic juices such as orange juice.  NO MINT OR MENTHOL PRODUCTS SO NO COUGH DROPS  USE SUGARLESS CANDY INSTEAD (jolley ranchers or Stover's)  NO OIL BASED VITAMINS - use powdered substitutes.    Please schedule a follow up office visit in 4 weeks, sooner if needed - bring your walking equipment with you

## 2013-08-18 DIAGNOSIS — R0609 Other forms of dyspnea: Secondary | ICD-10-CM | POA: Insufficient documentation

## 2013-08-18 DIAGNOSIS — R0600 Dyspnea, unspecified: Secondary | ICD-10-CM | POA: Insufficient documentation

## 2013-08-18 NOTE — Assessment & Plan Note (Signed)
ACE inhibitors are problematic in  pts with airway complaints because  even experienced pulmonologists can't always distinguish ace effects from copd/asthma.  By themselves they don't actually cause a problem, much like oxygen can't by itself start a fire, but they certainly serve as a powerful catalyst or enhancer for any "fire"  or inflammatory process in the upper airway, be it caused by an ET  tube or more commonly reflux (especially in the obese or pts with known GERD or who are on biphoshonates).    In the era of ARB near equivalency until we have a better handle on the reversibility of the airway problem, it just makes sense to avoid ACEI  entirely in the short run and then decide later, having established a level of airway control using a reasonable limited regimen, whether to add back ace but even then being very careful to observe the pt for worsening airway control and number of meds used/ needed to control symptoms.    Given the complexity of interpreting all her symptoms and the bnp of 14/ef of over 50% I would avoid this class entirely if it proves to be the cause of her recent decline.

## 2013-08-18 NOTE — Assessment & Plan Note (Signed)
-   see f/v loops 08/01/13 c/w upper airway obstruction   ddx here is   1) ACEi effect > the easiest dx to exclude (see cardiomyopathy) 2) neuromuscular weakness affecting resp muscles > only seen typically in endstage dz 3) subglottic stenosis or vcd >  may need fob if not better in 4-6 weeks off acei 4) underlying copd. As I explained to this patient in detail:  although there may be significant copd present, it does not appear to be limiting activity tolerance any more than a set of worn tires limits someone from driving a car  around a parking lot.  A new set of Michelins might look good but would have no perceived impact on the performance of the car and would not be worth the cost.  That is to say:   this pt is so sedentary at this point related to MS I don't recommend aggressive pulmonary rx at this point unless limiting symptoms arise or acute exacerbations become as issue, neither of which are the case now.  I asked the patient to contact this office at any time in the future should either of these problems arise.  5) CHF > extremely unlikely based on last bnp of 14 in 03/2013 and cxr/ exam.

## 2013-08-24 ENCOUNTER — Ambulatory Visit (HOSPITAL_COMMUNITY)
Admission: RE | Admit: 2013-08-24 | Discharge: 2013-08-24 | Disposition: A | Discharge status: Home / Self Care | Payer: Managed Care, Other (non HMO) | Source: Ambulatory Visit | Attending: Internal Medicine | Admitting: Internal Medicine

## 2013-08-24 ENCOUNTER — Encounter (HOSPITAL_COMMUNITY): Payer: Self-pay

## 2013-08-24 VITALS — BP 92/54 | HR 60 | Wt 112.6 lb

## 2013-08-24 DIAGNOSIS — R002 Palpitations: Secondary | ICD-10-CM

## 2013-08-24 DIAGNOSIS — G709 Myoneural disorder, unspecified: Secondary | ICD-10-CM

## 2013-08-24 DIAGNOSIS — R0600 Dyspnea, unspecified: Secondary | ICD-10-CM

## 2013-08-24 DIAGNOSIS — R0609 Other forms of dyspnea: Secondary | ICD-10-CM

## 2013-08-24 DIAGNOSIS — I428 Other cardiomyopathies: Secondary | ICD-10-CM

## 2013-08-24 DIAGNOSIS — I420 Dilated cardiomyopathy: Secondary | ICD-10-CM

## 2013-08-24 DIAGNOSIS — R0989 Other specified symptoms and signs involving the circulatory and respiratory systems: Secondary | ICD-10-CM

## 2013-08-24 NOTE — Patient Instructions (Addendum)
Great meeting you today.  We will call you with monitor results if results are NORMAL, follow up in 6 months. If results are ABNORMAL, follow up in 1 month with Dr. Shirlee Latch  Do the following things EVERYDAY: 1) Weigh yourself in the morning before breakfast. Write it down and keep it in a log. 2) Take your medicines as prescribed 3) Eat low salt foods-Limit salt (sodium) to 2000 mg per day.  4) Stay as active as you can everyday 5) Limit all fluids for the day to less than 2 liters 6)

## 2013-08-24 NOTE — Progress Notes (Signed)
Patient ID: [name], female   DOB: 1961-04-28, 53 y.o.   MRN: [id number] PCP: Dr. Corliss Blacker  53 yo with complicated past medical history presents for evaluation of her cardiomyopathy.  She has a long history of neuromuscular disorder.  The exact diagnosis remains undetermined.  She is confined to a wheelchair.  In 12/13, she was admitted to [facility] with hypotension, hypothermia, and altered mental status.   She was found to have profound anemia with hemoglobin down to 3.  She was transfused 4 units.  She also was found to have Enterococcal UTI and Serratia PNA.  She was intubated and eventually required tracheostomy.  During her hospitalization, she was found to have a cardiomyopathy with EF 20-25% by echo.  She was treated with milrinone.  She is now home again with her husband.  Trach and PEG are out. Repeat echo in 5/14 showed EF improved to 50-55% with abnormal septal motion and last echo in 9/14 showed EF 55-60%.    Previously lasix was increased to 40 mg in am and 20 mg in pm along with 20 meq of potassium. Dr. Shirlee Latch offered her a RHC at that time however she declined. PFTs obtained as noted below and showed very severe airway obstruction with subsequent evaluation by Dr [name] .At that time Dr [name] switched lisinopril to irbesartan. He felt like despite severity of her COPD main limitation was her neuromuscular condition.  She also had an event monitor placed 1/23 due to tachy palpitations.   Returns for f/u. Continues with tachypalpitations most days of the week. Typically last a few minutes and resolved. Occasional lightheadedness. No syncope. Remains dyspneic but says it has gotten much better with increase in lasix and switch from lisinopril to irbesartan. Able to walk with walker at times. No edema. No longer seeing neurologist. Says she is miserable if SBP goes < 90.   Labs (1/14): K 3.9, creatinine 0.21, HCT 30 Labs (4/14): K 4, creatinine 0.4, BNP 19 Labs (10/14): K 3.5, creatinine  0.5 Labs (12/14): K 3.6, creatinine 0.4 Labs (07/28/12): K 3.5 creatinine 0.5 Labs (08/01/13): K 3.5 Creatinine 0.5 Pro BNP 129  PFTs 08/01/13  FEV1 0.98 liters (33%)  FVC 1.87  ( 50%)  FEV 1/FVC 52% FEF 25-75% 0.65 liter  PMH: 1. Anorexia/cachexia/failure to thrive: Patient had a PEG tube.  2. Neuromuscular disease: exact diagnosis is still undetermined despite extensive neurology evaluation.  She does not have multiple sclerosis.  3. Fibromyalgia 4. IBS 5. TMJ syndrome 6. PTSD 7. GERD 8. Sacral decubitus ulcer 9. H/o aspiration 10. 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  Echo (12/13) with EF 20-25% with diffuse hypokinesis, mild MR, moderate RV dilation and moderate RV dysfunction.  Echo (5/14) with EF 50-55%, abnormal septal motion.  Echo (9/14) with EF 55-60%.   11. Anemia  SH: Lives with husband in [place].  Quit smoking > 20 years ago.  No ETOH.     FH: No history of cardiomyopathy.  Grandfather had MI at 51.   ROS: All systems reviewed and negative except as per HPI.    Current Outpatient Prescriptions  Medication Sig Dispense Refill  . Aluminum & Magnesium Hydroxide (MAG-AL PO) Take 250 mg by mouth.      . bisoprolol (ZEBETA) 5 MG tablet Take 1 tablet (5 mg total) by mouth daily.  30 tablet  6  . cholecalciferol (VITAMIN D) 1000 UNITS tablet Take 2,000 Units by mouth daily.       Marland Kitchen  furosemide (LASIX) 20 MG tablet Take 2 tablets in the am, take 1 tablet pm  90 tablet  6  . irbesartan (AVAPRO) 75 MG tablet Take 1 tablet (75 mg total) by mouth daily.  30 tablet  11  . lactulose (CHRONULAC) 10 GM/15ML solution 30 g as directed.       . Multiple Vitamins-Minerals (CEROVITE ADVANCED FORMULA) LIQD Take 15 mLs by mouth daily.      Marland Kitchen omeprazole (PRILOSEC) 20 MG capsule Take 20 mg by mouth 2 (two) times daily.      . potassium chloride (KLOR-CON 10) 10 MEQ tablet Take 2 tablets (20 mEq total) by mouth daily.  60  tablet  6  . Zinc 30 MG CAPS Take 1 capsule by mouth daily.        No current facility-administered medications for this encounter.    BP 92/54  Pulse 60  Wt 112 lb 9.6 oz (51.075 kg)  SpO2 98% General: NAD, thin.  Neck: No JVD, no thyromegaly or thyroid nodule.  Lungs: Clear to auscultation bilaterally with normal respiratory effort. Decreased BS throughout.  CV: Nondisplaced PMI.  Heart regular S1/S2, no S3/S4, no murmur.  No ankle edema.  No carotid bruit.  Normal pedal pulses. Wound on RLE improved Abdomen: Soft, nontender, no hepatosplenomegaly, mildly distended.  Neurologic: Alert and oriented x 3.  Psych: Normal affect. Extremities: No clubbing or cyanosis.   Assessment/Plan: 1. Cardiomyopathy: Most recent echo in 5/14 showed improvement in EF to 55-60%, so stress cardiomyopathy seems most likely.  We discussed the possibility of reducing her meds especially with her low BP but she would like to continue as she says is miserable if SBP > 90  2. Dyspnea: Improved. Doubt cardiac in nature. Suspect combination of COPD and deconditioning due to neuromuscular d/o. Have recommended neuro-rehab. She wants to thinks about this.  3.. Neuromuscular disorder: Not fully characterized.  She is in a wheelchair most of the time but can walk with a walker. As above. Recommended re-establishing with Neurologist and considering neuro rehab.   4. Palpitations: These persist. Await results of event monitor. Continue bisoprolol. I told her we would contact her with results of monitor. If benign, can f/u in 6 months. If abnormal can f/u sooner to discuss.   Masahiro Iglesia,MD 12:09 PM

## 2013-08-25 IMAGING — CR DG CHEST 1V PORT
1 series · 1 of 1 positions shown · non-contrast
Comparison: 06/19/2012; 06/18/2012; 06/13/2012; 06/06/2012

CLINICAL DATA: Respiratory failure, weakness

PORTABLE CHEST - 1 VIEW

[AP]
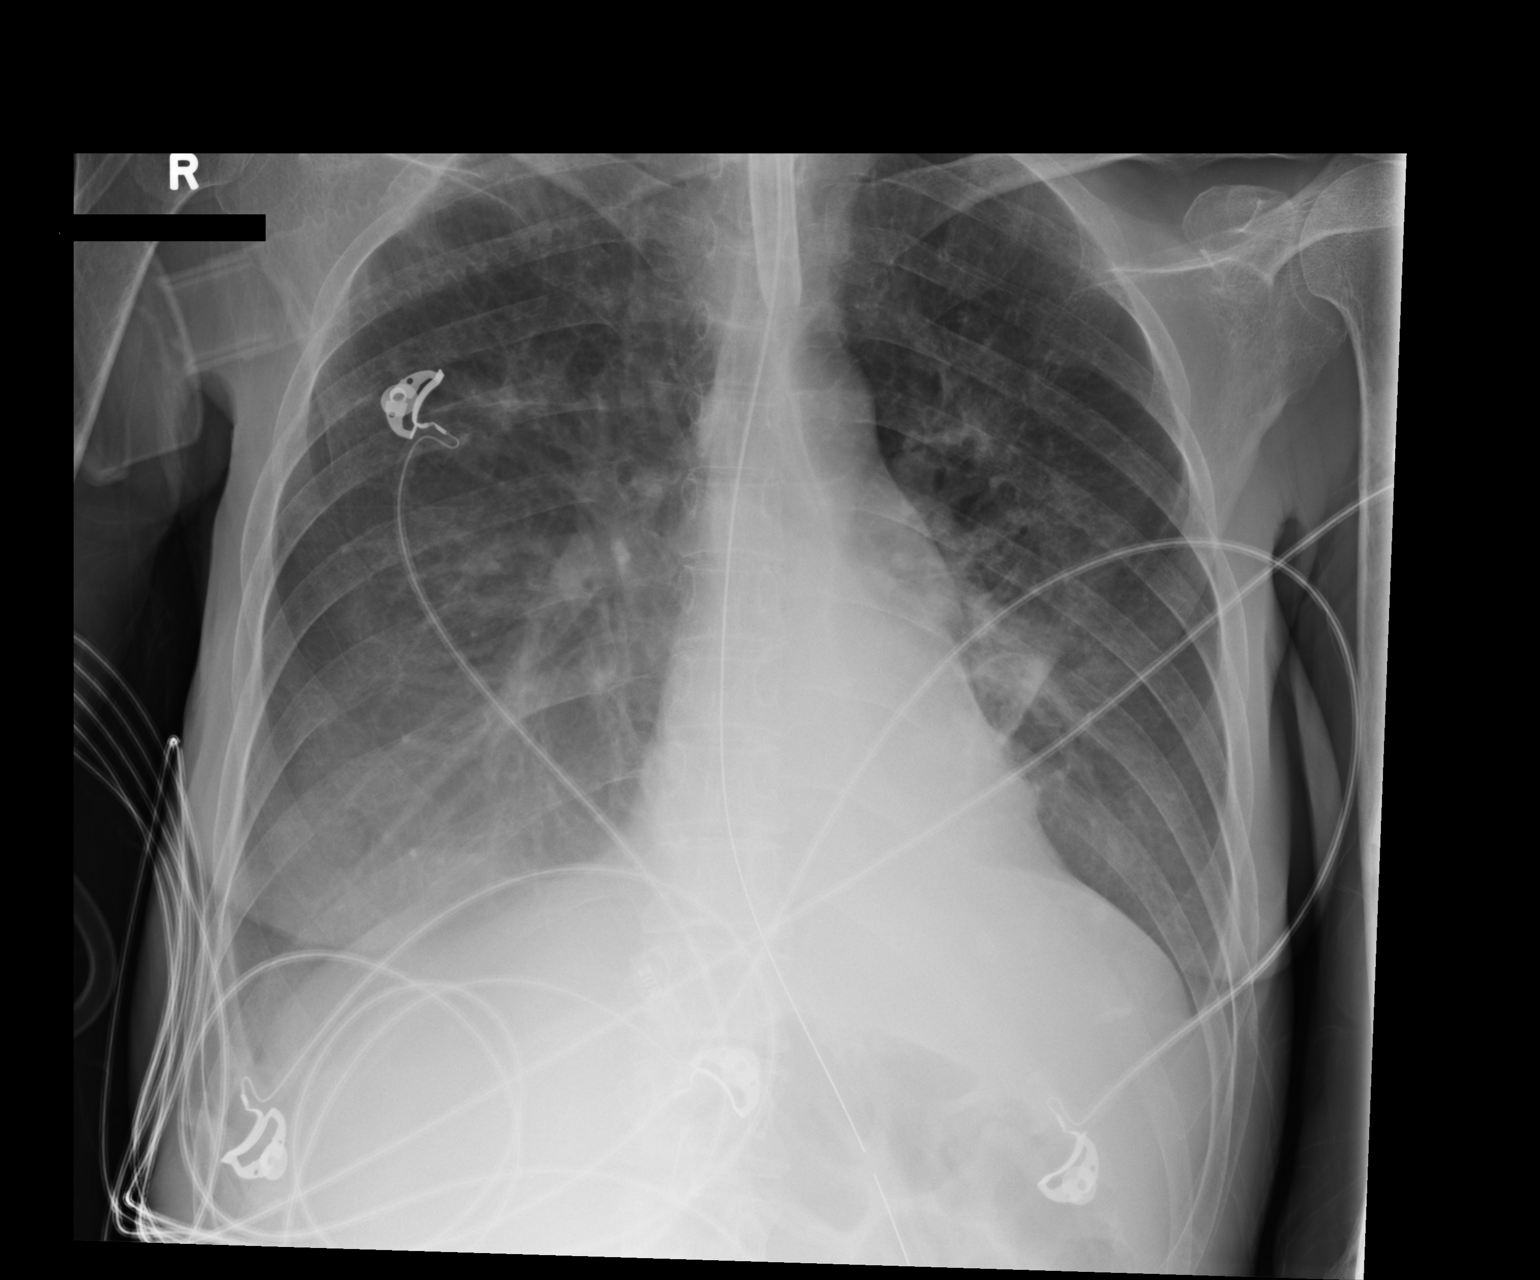

[1 of 1 positions shown; findings below may reference images not displayed]

FINDINGS: Grossly unchanged cardiac silhouette and mediastinal
contours.  Interval removal of left subclavian vein approach
central venous catheter.  Otherwise, stable positioning of
remaining support apparatus. No pneumothorax.

Grossly unchanged left basilar/retrocardiac consolidative
opacities.  Hazy opacities overlying the bilateral lower lungs are
unchanged and favored to represent overlying soft tissue.  No new
focal airspace opacities.  Unchanged bones.
IMPRESSION: 1.  Interval removal of left subclavian vein approach central
venous catheter.  Otherwise, stable positioning of remaining
support apparatus.  No pneumothorax.
2.  Grossly unchanged left basilar/retrocardiac consolidative
opacities, atelectasis versus infiltrate.

## 2013-08-27 IMAGING — CR DG ABD PORTABLE 1V
1 series · 1 of 1 positions shown · non-contrast
Comparison: 06/20/2012

CLINICAL DATA: Assess position of barium, weakness, back pain

PORTABLE ABDOMEN - 1 VIEW

[AP]
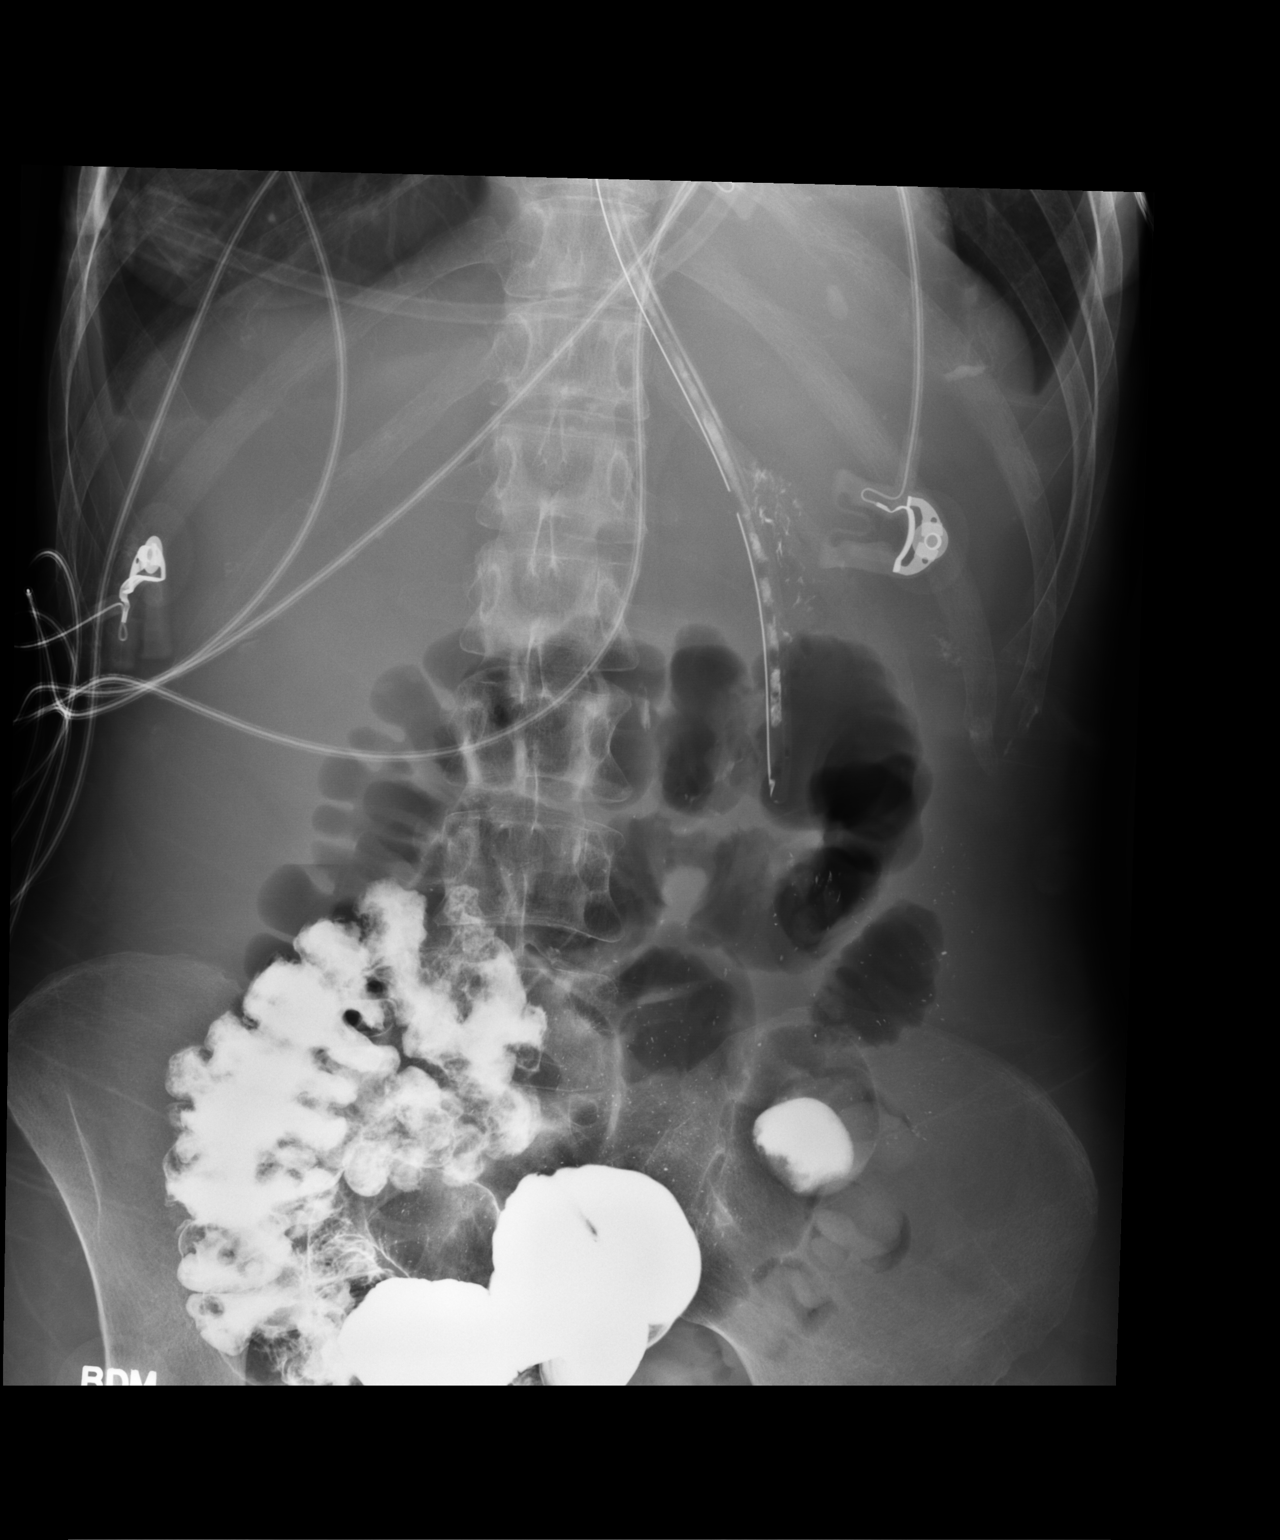

[1 of 1 positions shown; findings below may reference images not displayed]

FINDINGS: Tip of nasogastric tube in stomach.
Barium is present at the distal ileum through the proximal to
midportions of the ascending colon.
Transverse colon is not yet opacified.
Few dilated air filled loops of small bowel are present.
Bones diffusely demineralized.
Left lower lobe atelectasis versus consolidation.
Small bibasilar effusions.
IMPRESSION: Barium from the distal ileum through the ascending colon but has
not yet reached the transverse colon.
Persistent mild small bowel dilatation.

## 2013-09-01 ENCOUNTER — Telehealth: Payer: Self-pay | Admitting: Cardiology

## 2013-09-01 NOTE — Telephone Encounter (Signed)
New message    Pt sees Dr Shirlee Latch at the [facility]---she wore a monitor and have not been called with the results

## 2013-09-01 NOTE — Telephone Encounter (Addendum)
Called wanting the monitor results.  Advised the results are back but has not been reviewed by Dr. Gala Romney.  Will forward to Dr. Gala Romney and his nurse Herbert Seta.  Someone will call her next week with results. She understands and will wait for call. Results are in epic.

## 2013-09-03 IMAGING — CT CT CHEST W/O CM
2 of 3 series · 15 of 36 positions shown, 18 images · non-contrast
Comparison: 06/27/2012 and prior chest radiographs dating back to
07/27/2005.

CLINICAL DATA: 51-year-old female with respiratory failure,
hypotension and hypoglycemia.

CT CHEST WITHOUT CONTRAST
TECHNIQUE: Multidetector CT imaging of the chest was performed
following the standard protocol without IV contrast.

[Series 2: routine chest 5.0 st · axial · 0.62mm/px · z∈[-48,+206]mm · 12 of 61 slices shown, 15 images]
[im 5/61  mediastinal]
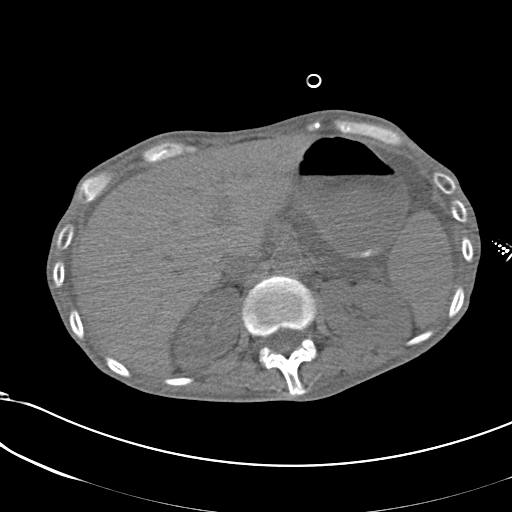
[im 5/61  lung]
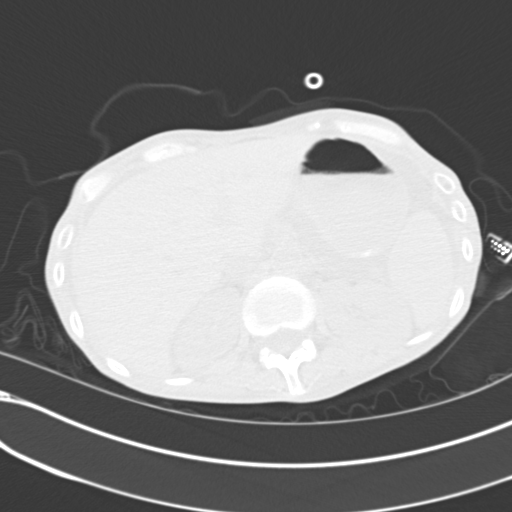
[im 9/61  lung]
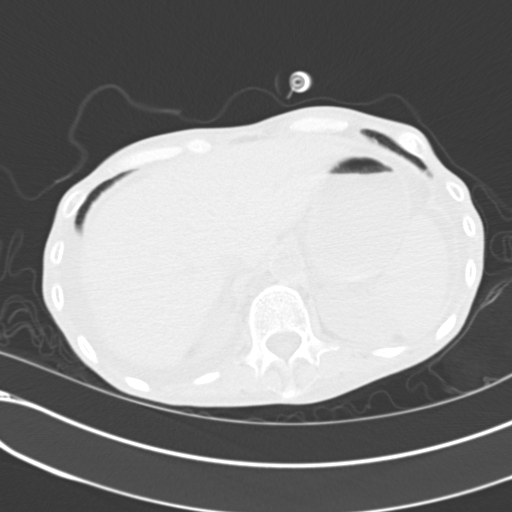
[im 14/61  lung]
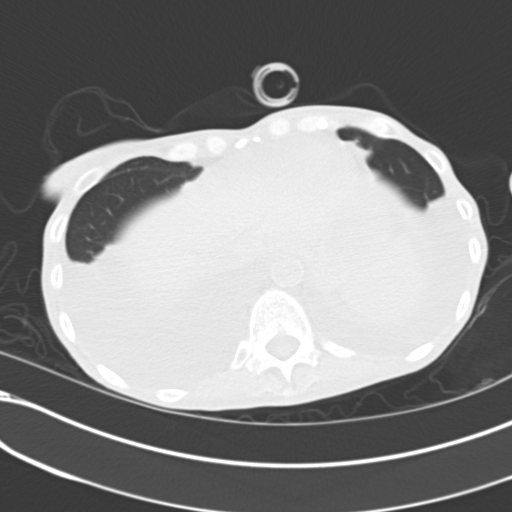
[im 18/61  lung]
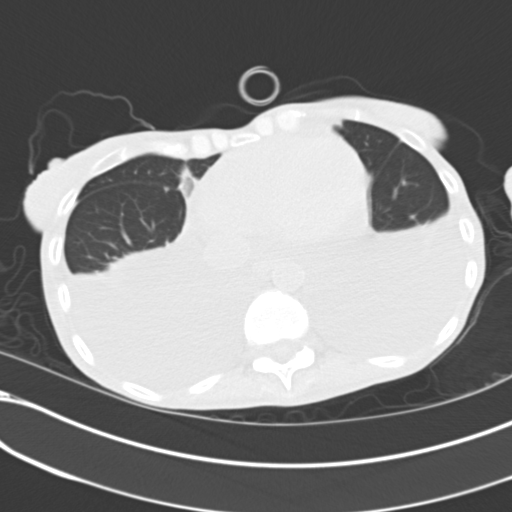
[im 23/61  mediastinal]
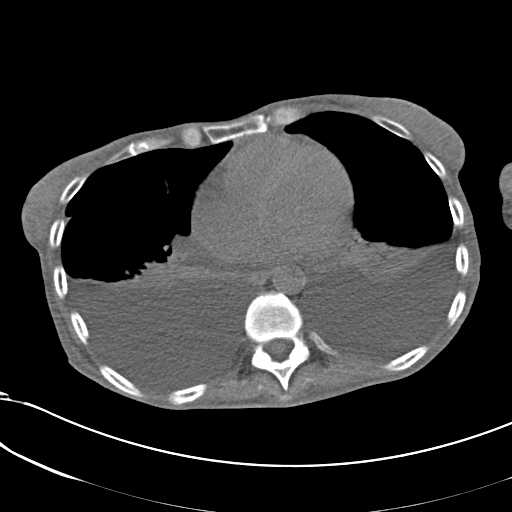
[im 23/61  lung]
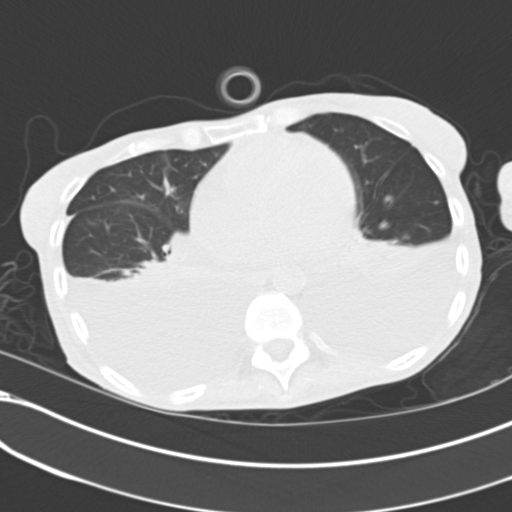
[im 27/61  lung]
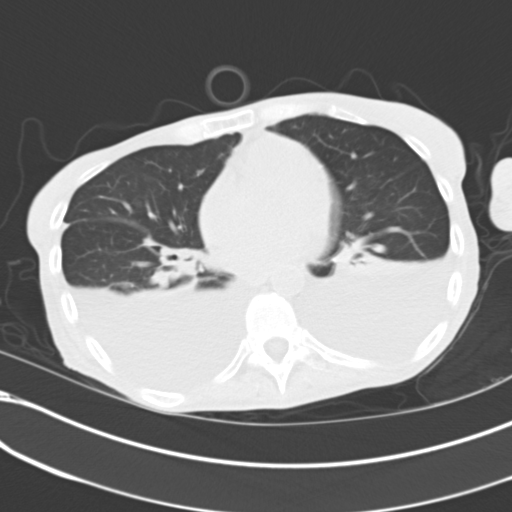
[im 34/61  lung]
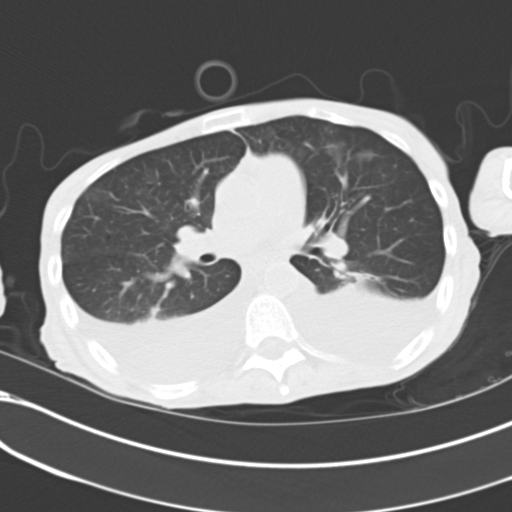
[im 38/61  lung]
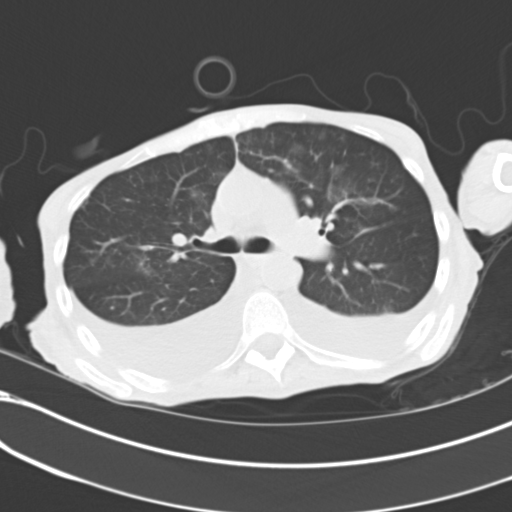
[im 43/61  mediastinal]
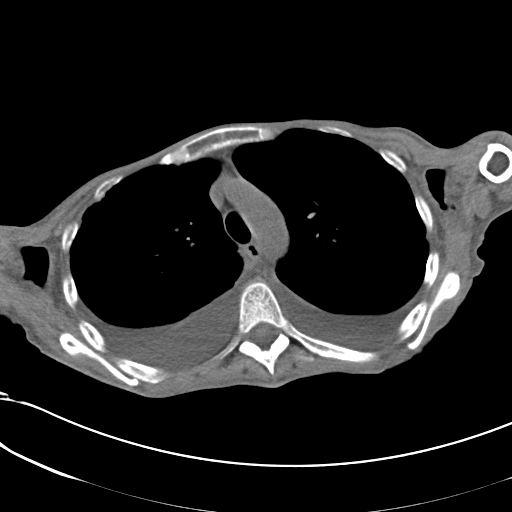
[im 43/61  lung]
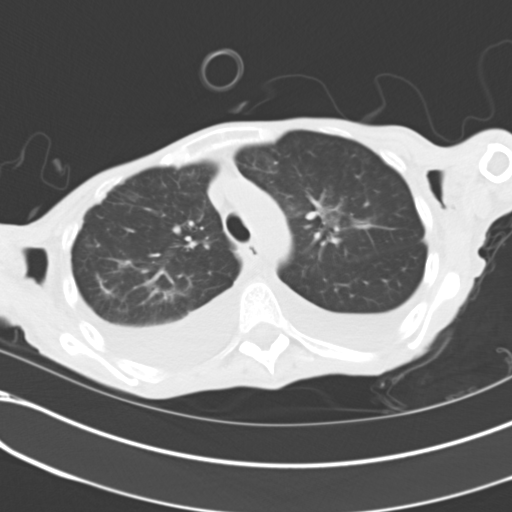
[im 47/61  lung]
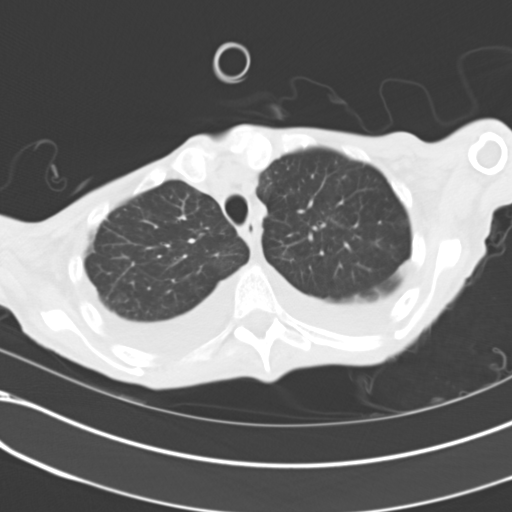
[im 52/61  lung]
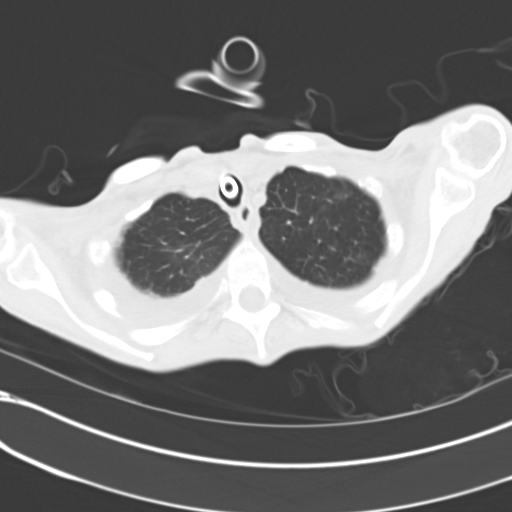
[im 56/61  lung]
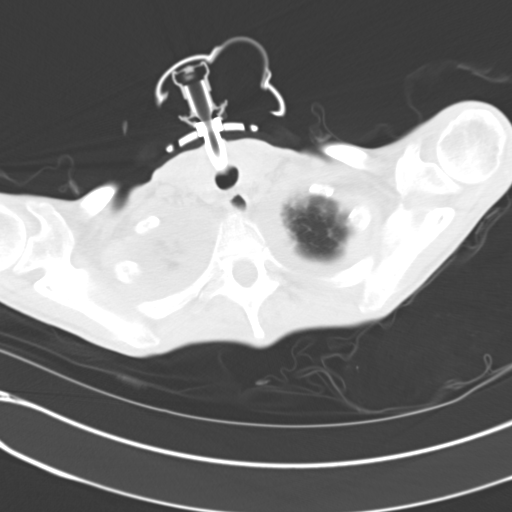

[Series 5: coronals · coronal · 0.66mm/px · 3 of 89 slices shown]
[im 18/89  lung]
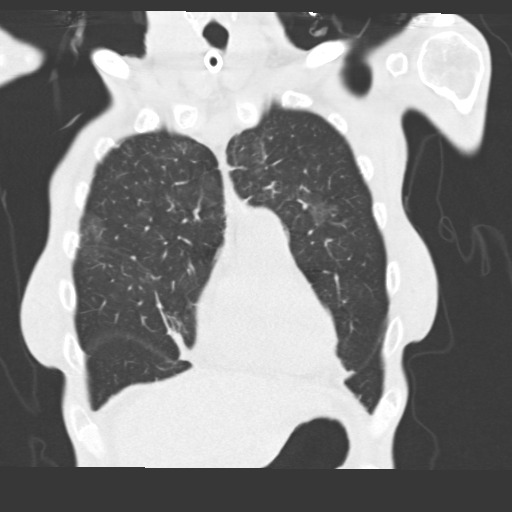
[im 36/89  lung]
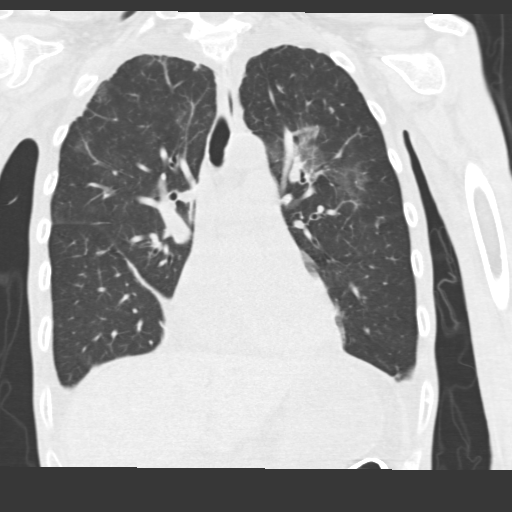
[im 53/89  lung]
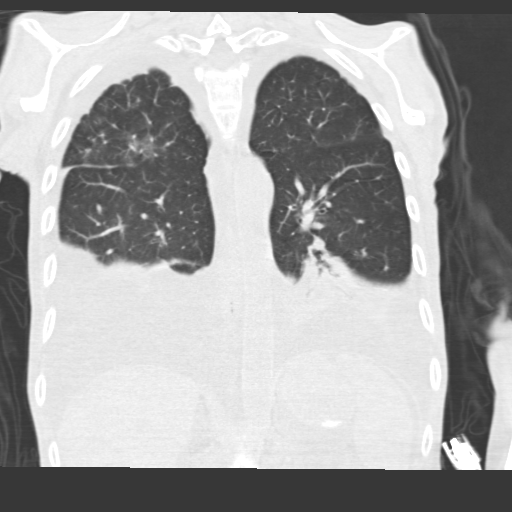

[15 of 36 positions shown; findings below may reference images not displayed]

FINDINGS: A tracheostomy tube is identified in satisfactory
position with tip approximately 4.5 cm above the carina.

The heart and great vessels are within normal limits on this
noncontrast study.
Moderate to large bilateral pleural effusions with outer bilateral
lower lung atelectasis identified.
No definite enlarged lymph nodes or pericardial effusion noted.

Minimal patchy ground-glass opacities within the mid and upper
lungs are noted and may represent mild edema or mild
infection/inflammation.

There is no evidence of suspicious nodule, mass or
endobronchial/endotracheal lesions.

A small amount of ascites within the upper abdomen is identified.
No acute or suspicious bony abnormalities are present.
IMPRESSION: Moderate to large bilateral pleural effusions with moderate
bilateral lower lung atelectasis.

Mild central ground-glass opacities which may represent mild edema
versus mild infection/inflammation.

Small amount of ascites within the upper abdomen visualized.

## 2013-09-06 NOTE — Telephone Encounter (Signed)
Pt given monitor results. Pt also requests appts to be scheduled at [facility] office rather than [facility]. I told her I would place a recall for 6 months for [facility].

## 2013-09-06 NOTE — Telephone Encounter (Signed)
New message   Patient did not want to leave a message just have the nurse to call her back when she can.

## 2013-09-14 ENCOUNTER — Ambulatory Visit: Payer: Managed Care, Other (non HMO) | Admitting: Internal Medicine

## 2013-09-15 ENCOUNTER — Ambulatory Visit (INDEPENDENT_AMBULATORY_CARE_PROVIDER_SITE_OTHER): Payer: Managed Care, Other (non HMO) | Admitting: Internal Medicine

## 2013-09-15 ENCOUNTER — Encounter: Payer: Self-pay | Admitting: Internal Medicine

## 2013-09-15 VITALS — BP 118/66 | HR 65 | Ht 65.5 in | Wt 113.4 lb

## 2013-09-15 DIAGNOSIS — I509 Heart failure, unspecified: Secondary | ICD-10-CM

## 2013-09-15 DIAGNOSIS — R0989 Other specified symptoms and signs involving the circulatory and respiratory systems: Secondary | ICD-10-CM

## 2013-09-15 DIAGNOSIS — R0600 Dyspnea, unspecified: Secondary | ICD-10-CM

## 2013-09-15 DIAGNOSIS — I5022 Chronic systolic (congestive) heart failure: Secondary | ICD-10-CM

## 2013-09-15 DIAGNOSIS — R0609 Other forms of dyspnea: Secondary | ICD-10-CM

## 2013-09-15 MED ORDER — TIOTROPIUM BROMIDE MONOHYDRATE 2.5 MCG/ACT IN AERS: INHALATION_SPRAY | RESPIRATORY_TRACT | Status: DC

## 2013-09-15 NOTE — Patient Instructions (Addendum)
spiriva 2puffs each am to see if your activity tolerance improves  Please schedule a follow up office visit in 6 weeks, call sooner if needed

## 2013-09-15 NOTE — Progress Notes (Signed)
Subjective:    Patient ID: [name], female    DOB: 22-Sep-1960   MRN: [id number]    Brief patient profile:  52 yowf quit smoking 1989 then ? MS dx'd early 90's and "it's been steadily downhill since" mostly muscle related but since 2012 also with breathing > admit:  Admit date: 06/06/2012  Discharge date: 06/20/2012  Discharge Diagnoses:  Acute respiratory failure  CAP (community acquired pneumonia) vs aspiration pneumonia  Acute encephalopathy, resolved  Neuromuscular disorder: not fully defined.  Anemia  Thrombocytopenia  Coagulopathy  Acute liver failure  Enterococcal UTI (pansensitive)  Shock circulatory, suspect primarily cardiogenic  Sacral Pressure Ulcer  Dilated cardiomyopathy  Malnutrition compromising bodily function  H/O anorexia nervosa  Dysphagia    History of Present Illness  08/17/2013 1st [facility] office visit/ [name] was able do some adl/s with walker and no 02 summer of 2014  then downhill since  And now sob at rest  X 2 weeks and able to lie down at 45 degrees and can't tol lying flat at all for sev weeks assoc with dry cough day and night and dysphagia/ choking sensation on acei.  Already on gerd rx/ inhaler no benefit.  rec Stop lisinopril Avapro 75 mg one daily  Prilosec 20 mg Take 30- 60 min before your first and last meals of the day  GERD    09/15/2013 f/u ov/Kaelynne Christley re: acei vs copd  Chief Complaint  Patient presents with  . Follow-up    Pt states that her SOB/cough has improved greatly.  Pt still c/o SOB but is much better since lv.    limited more by fatigue and weakness than sob, swallowing fine now   No obvious day to day or daytime variabilty or assoc chronic cough or cp or chest tightness, subjective wheeze overt sinus or hb symptoms. No unusual exp hx or h/o childhood pna/ asthma or knowledge of premature birth.  Sleeping ok without nocturnal  or early am exacerbation  of respiratory  c/o's or need for noct saba. Also  denies any obvious fluctuation of symptoms with weather or environmental changes or other aggravating or alleviating factors except as outlined above   Current Medications, Allergies, Complete Past Medical History, Past Surgical History, Family History, and Social History were reviewed in [employer] record.  ROS  The following are not active complaints unless bolded sore throat, dysphagia, dental problems, itching, sneezing,  nasal congestion or excess/ purulent secretions, ear ache,   fever, chills, sweats, unintended wt loss, pleuritic or exertional cp, hemoptysis,  orthopnea pnd or leg swelling, presyncope, palpitations, heartburn, abdominal pain, anorexia, nausea, vomiting, diarrhea  or change in bowel or urinary habits, change in stools or urine, dysuria,hematuria,  rash, arthralgias, visual complaints, headache, numbness weakness or ataxia or problems with walking or coordination,  change in mood/affect or memory.                   Objective:   Physical Exam  Chronically ill thin w/c bound wf nad   09/15/2013       113  Wt Readings from Last 3 Encounters:  08/17/13 113 lb (51.256 kg)  07/18/13 114 lb 6.4 oz (51.891 kg)  06/21/13 112 lb (50.803 kg)      HEENT: nl dentition, turbinates, and orophanx. Nl external ear canals without cough reflex   NECK :  without JVD/Nodes/TM/ nl carotid upstrokes bilaterally   LUNGS: no acc muscle use, clear to A and P bilaterally  without cough on insp or exp maneuvers   CV:  RRR  no s3 or murmur or increase in P2, no edema   ABD:  soft and nontender with nl excursion in the supine position. No bruits or organomegaly, bowel sounds nl  MS:  warm without deformities, calf tenderness, cyanosis or clubbing  SKIN: warm and dry without lesions    NEURO:  alert, approp, gen weakness     cxr 07/18/13    The heart size and mediastinal contours are within normal limits.  Lungs are hyperexpanded. Mild scarring is  noted in the upper lobes.  The lungs are otherwise clear. No pleural effusion or pneumothorax.  The bony thorax is intact.  IMPRESSION:  No acute cardiopulmonary disease. Lung hyperexpansion suggests COPD     Assessment & Plan:

## 2013-09-16 NOTE — Assessment & Plan Note (Addendum)
-   sp smoking cessation in 1989 - see f/v loops 08/01/13 c/w upper airway obstruction  - trial off acei 08/18/2013 > marked improvement - 09/15/2013   Walked RA x about 50 ft and   stopped due to  Fatigue s sob or desat   She probably does have some copd but does not want to repeat the pfts off acei so reasonable to try spiriva to see if improves exercise tol and if not just stop it and accept that most of her activity intolerance is attributable to MS/ deconditioning/ not lung dz  The proper method of use, as well as anticipated side effects, of a metered-dose inhaler are discussed and demonstrated to the patient. Improved effectiveness after extensive coaching during this visit to a level of approximately  75%  See instructions for specific recommendations which were reviewed directly with the patient who was given a copy with highlighter outlining the key components.

## 2013-09-16 NOTE — Assessment & Plan Note (Addendum)
Trial off acei 08/18/2013 > marked improvement in "copd" symptoms 09/15/13   Therefore rec avoid acei indefinitely - Dr Clarise Cruz was messaged re this evaluation.

## 2013-09-27 ENCOUNTER — Telehealth: Payer: Self-pay | Admitting: Internal Medicine

## 2013-09-27 NOTE — Telephone Encounter (Signed)
Ok to use proair 2 puffs every 4 hours as needed Send her rx for one and 2 refills

## 2013-09-27 NOTE — Telephone Encounter (Signed)
I called and spoke with [name]. She reports the spiriva resp has helped a lot with her breathing. Thinks this awesome and has not been able to breathe like this in years. She has noticed when she gets around perfumes/odors/cigarette smoke then she notices the SOB at that time. She is wanting to know if we can RX rescue inhaler. She has CHF and has used xopenex when had PFT. Did not affect her heart. Please advise MW thanks

## 2013-09-28 MED ORDER — LEVALBUTEROL TARTRATE 45 MCG/ACT IN AERO: 1.0000 | INHALATION_SPRAY | RESPIRATORY_TRACT | Status: DC | PRN

## 2013-09-28 NOTE — Telephone Encounter (Signed)
Called and spoke with pt. She would like xopenex inhaler called in. Nothing further needed

## 2013-09-28 NOTE — Telephone Encounter (Signed)
Called and spoke with pt. She is concerned bc she has CHF and she knows this will "jack the heart up". Please advise MW thanks

## 2013-09-28 NOTE — Telephone Encounter (Signed)
Probably ok but if concerned to want to pay the extra cost then xopenex hfa 1-2 q 4hprn might be a better choice  Make sure she has f/u ov in next 6 weeks to regroup re longterm rx

## 2013-10-03 ENCOUNTER — Telehealth: Payer: Self-pay | Admitting: Internal Medicine

## 2013-10-03 DIAGNOSIS — R0600 Dyspnea, unspecified: Secondary | ICD-10-CM

## 2013-10-03 MED ORDER — LEVALBUTEROL HCL 0.63 MG/3ML IN NEBU: 0.6300 mg | INHALATION_SOLUTION | Freq: Four times a day (QID) | RESPIRATORY_TRACT | Status: DC | PRN

## 2013-10-03 NOTE — Telephone Encounter (Signed)
Spoke with the pt  She states that xopenex HFA works well for her, but her technique is not very good due to problems with her hands She is asking if we can switch her to the nebulized form of med Please advise, thanks!

## 2013-10-03 NOTE — Telephone Encounter (Signed)
Fine with me but quite expensive and not sure insurance will pay - dose is 0.625 mg q 6 h prn

## 2013-10-03 NOTE — Telephone Encounter (Signed)
Pt aware RX has been sent to [facility].  She also needed order sent to [facility] for a neb machine I have done so. Nothing further needed

## 2013-10-03 NOTE — Telephone Encounter (Signed)
lmomtcb x1 for pt 

## 2013-10-03 NOTE — Telephone Encounter (Signed)
Pt returned call

## 2013-10-27 ENCOUNTER — Encounter: Payer: Self-pay | Admitting: Internal Medicine

## 2013-10-27 ENCOUNTER — Ambulatory Visit (INDEPENDENT_AMBULATORY_CARE_PROVIDER_SITE_OTHER): Payer: Managed Care, Other (non HMO) | Admitting: Internal Medicine

## 2013-10-27 VITALS — BP 128/86 | HR 70 | Temp 97.6°F | Ht 65.5 in | Wt 113.0 lb

## 2013-10-27 DIAGNOSIS — R0609 Other forms of dyspnea: Secondary | ICD-10-CM

## 2013-10-27 DIAGNOSIS — R0989 Other specified symptoms and signs involving the circulatory and respiratory systems: Secondary | ICD-10-CM

## 2013-10-27 DIAGNOSIS — R0600 Dyspnea, unspecified: Secondary | ICD-10-CM

## 2013-10-27 NOTE — Progress Notes (Signed)
Subjective:    Patient ID: [name], female    DOB: [birth year]   MRN: [id number]    Brief patient profile:  52 yowf quit smoking 1989 then ? MS dx'd early 90's and "it's been steadily downhill since" mostly muscle related but since 2012 also with breathing difficulty > admit:  Admit date: 06/06/2012  Discharge date: 06/20/2012  Discharge Diagnoses:  Acute respiratory failure  CAP (community acquired pneumonia) vs aspiration pneumonia  Acute encephalopathy, resolved  Neuromuscular disorder: not fully defined.  Anemia  Thrombocytopenia  Coagulopathy  Acute liver failure  Enterococcal UTI (pansensitive)  Shock circulatory, suspect primarily cardiogenic  Sacral Pressure Ulcer  Dilated cardiomyopathy  Malnutrition compromising bodily function  H/O anorexia nervosa  Dysphagia    History of Present Illness  08/17/2013 1st [facility] office visit/ [name] was able do some adl/s with walker and no 02 summer of 2014  then downhill since  And now sob at rest  X 2 weeks and able to lie down at 45 degrees and can't tol lying flat at all for sev weeks assoc with dry cough day and night and dysphagia/ choking sensation on acei.  Already on gerd rx/ inhaler no benefit.  rec Stop lisinopril Avapro 75 mg one daily  Prilosec 20 mg Take 30- 60 min before your first and last meals of the day  GERD diet     09/15/2013 f/u ov/[name] re: acei vs copd  Chief Complaint  Patient presents with  . Follow-up    Pt states that her SOB/cough has improved greatly.  Pt still c/o SOB but is much better since lv.    limited more by fatigue and weakness than sob, swallowing fine now  rec spiriva 2puffs each am to see if your activity tolerance improves   10/27/2013 f/u ov/[name] re:  Chief Complaint  Patient presents with  . Follow-up    Pt states that her breathing is doing much better. She states that she was unable to use the xopenex HFA due to her hands not working well and does not like  neb b/c its too messy.   Only needing xopenex hfa once or twice a week since started  spiriva  Swallowing better   No obvious day to day or daytime variabilty or assoc chronic cough or cp or chest tightness, subjective wheeze overt sinus or hb symptoms. No unusual exp hx or h/o childhood pna/ asthma or knowledge of premature birth.  Sleeping ok without nocturnal  or early am exacerbation  of respiratory  c/o's or need for noct saba. Also denies any obvious fluctuation of symptoms with weather or environmental changes or other aggravating or alleviating factors except as outlined above   Current Medications, Allergies, Complete Past Medical History, Past Surgical History, Family History, and Social History were reviewed in [employer] record.  ROS  The following are not active complaints unless bolded sore throat, dysphagia, dental problems, itching, sneezing,  nasal congestion or excess/ purulent secretions, ear ache,   fever, chills, sweats, unintended wt loss, pleuritic or exertional cp, hemoptysis,  orthopnea pnd or leg swelling, presyncope, palpitations, heartburn, abdominal pain, anorexia, nausea, vomiting, diarrhea  or change in bowel or urinary habits, change in stools or urine, dysuria,hematuria,  rash, arthralgias, visual complaints, headache, numbness weakness or ataxia or problems with walking or coordination,  change in mood/affect or memory.                   Objective:  Physical Exam  Chronically ill thin w/c bound wf nad   09/15/2013       113  > 10/27/2013   113  Wt Readings from Last 3 Encounters:  08/17/13 113 lb (51.256 kg)  07/18/13 114 lb 6.4 oz (51.891 kg)  06/21/13 112 lb (50.803 kg)      HEENT: nl dentition, turbinates, and orophanx. Nl external ear canals without cough reflex   NECK :  without JVD/Nodes/TM/ nl carotid upstrokes bilaterally   LUNGS: no acc muscle use, clear to A and P bilaterally without cough on insp or exp  maneuvers   CV:  RRR  no s3 or murmur or increase in P2, no edema   ABD:  soft and nontender with nl excursion in the supine position. No bruits or organomegaly, bowel sounds nl  MS:  warm without deformities, calf tenderness, cyanosis or clubbing           cxr 07/18/13    The heart size and mediastinal contours are within normal limits.  Lungs are hyperexpanded. Mild scarring is noted in the upper lobes.  The lungs are otherwise clear. No pleural effusion or pneumothorax.  The bony thorax is intact.  IMPRESSION:  No acute cardiopulmonary disease. Lung hyperexpansion suggests COPD     Assessment & Plan:

## 2013-10-27 NOTE — Patient Instructions (Signed)
Only use your levoalbuterol (xopenex)  as a rescue medication to be used if you can't catch your breath by resting or doing a relaxed purse lip breathing pattern.  - The less you use it, the better it will work when you need it. - Ok to use up to 2 puffs  every 4 hours if you must but call for immediate appointment if use goes up over your usual need - Don't leave home without it !!  (think of it like the spare tire for your car)   Please schedule a follow up visit in 6  months but call sooner if needed

## 2013-10-29 NOTE — Assessment & Plan Note (Addendum)
-   sp smoking cessation in 1989 - see f/v loops 08/01/13 c/w upper airway obstruction  - trial off acei 08/18/2013 > marked improvement - 09/15/2013   Walked RA x about 50 ft and stopped due to  Fatigue s sob or desat - Spiriva trial 09/15/2013 > improved 10/27/13 so continue  Her response to spiriva suggests she has enough copd to treat as a Group C with prn xopenex chosen to reduce cardiac side effects     Each maintenance medication was reviewed in detail including most importantly the difference between maintenance and as needed and under what circumstances the prns are to be used.  Please see instructions for details which were reviewed in writing and the patient given a copy.

## 2013-11-15 ENCOUNTER — Other Ambulatory Visit: Payer: Self-pay | Admitting: Family Medicine

## 2013-11-15 DIAGNOSIS — E079 Disorder of thyroid, unspecified: Secondary | ICD-10-CM

## 2013-11-16 ENCOUNTER — Other Ambulatory Visit: Payer: Managed Care, Other (non HMO)

## 2013-11-16 ENCOUNTER — Ambulatory Visit
Admission: RE | Admit: 2013-11-16 | Discharge: 2013-11-16 | Disposition: A | Discharge status: Home / Self Care | Payer: Managed Care, Other (non HMO) | Source: Ambulatory Visit | Attending: Family Medicine | Admitting: Family Medicine

## 2013-11-16 DIAGNOSIS — E079 Disorder of thyroid, unspecified: Secondary | ICD-10-CM

## 2013-11-20 ENCOUNTER — Other Ambulatory Visit: Payer: Self-pay | Admitting: Family Medicine

## 2013-11-20 DIAGNOSIS — R498 Other voice and resonance disorders: Secondary | ICD-10-CM

## 2013-11-24 ENCOUNTER — Ambulatory Visit
Admission: RE | Admit: 2013-11-24 | Discharge: 2013-11-24 | Disposition: A | Discharge status: Home / Self Care | Payer: Managed Care, Other (non HMO) | Source: Ambulatory Visit | Attending: Family Medicine | Admitting: Family Medicine

## 2013-11-24 DIAGNOSIS — R498 Other voice and resonance disorders: Secondary | ICD-10-CM

## 2013-11-24 MED ORDER — IOHEXOL 300 MG/ML  SOLN
75.0000 mL | Freq: Once | INTRAMUSCULAR | Status: AC | PRN
  Administered 2013-11-24: 75 mL via INTRAVENOUS

## 2014-02-09 ENCOUNTER — Encounter: Payer: Self-pay | Admitting: *Deleted

## 2014-02-23 ENCOUNTER — Telehealth: Payer: Self-pay | Admitting: *Deleted

## 2014-02-23 ENCOUNTER — Ambulatory Visit (INDEPENDENT_AMBULATORY_CARE_PROVIDER_SITE_OTHER): Payer: Managed Care, Other (non HMO) | Admitting: Cardiology

## 2014-02-23 ENCOUNTER — Encounter: Payer: Self-pay | Admitting: Cardiology

## 2014-02-23 VITALS — BP 115/80 | HR 70 | Ht 65.0 in | Wt 111.0 lb

## 2014-02-23 DIAGNOSIS — R1900 Intra-abdominal and pelvic swelling, mass and lump, unspecified site: Secondary | ICD-10-CM

## 2014-02-23 DIAGNOSIS — I509 Heart failure, unspecified: Secondary | ICD-10-CM

## 2014-02-23 DIAGNOSIS — R0989 Other specified symptoms and signs involving the circulatory and respiratory systems: Secondary | ICD-10-CM

## 2014-02-23 DIAGNOSIS — R002 Palpitations: Secondary | ICD-10-CM

## 2014-02-23 DIAGNOSIS — R0602 Shortness of breath: Secondary | ICD-10-CM

## 2014-02-23 DIAGNOSIS — G709 Myoneural disorder, unspecified: Secondary | ICD-10-CM

## 2014-02-23 DIAGNOSIS — I5022 Chronic systolic (congestive) heart failure: Secondary | ICD-10-CM

## 2014-02-23 DIAGNOSIS — R0609 Other forms of dyspnea: Secondary | ICD-10-CM

## 2014-02-23 DIAGNOSIS — R0600 Dyspnea, unspecified: Secondary | ICD-10-CM

## 2014-02-23 MED ORDER — FUROSEMIDE 20 MG PO TABS: ORAL_TABLET | ORAL | Status: DC

## 2014-02-23 MED ORDER — BISOPROLOL FUMARATE 5 MG PO TABS: 5.0000 mg | ORAL_TABLET | Freq: Every day | ORAL | Status: DC

## 2014-02-23 MED ORDER — POTASSIUM CHLORIDE ER 10 MEQ PO TBCR
20.0000 meq | EXTENDED_RELEASE_TABLET | Freq: Every day | ORAL | Status: DC
Start: 2014-02-23 — End: 2014-08-29

## 2014-02-23 NOTE — Patient Instructions (Addendum)
Stop irbesartan. Your physician recommends that you have  lab work today--BMET.  You have been referred to Dr Everlena Cooper at [facility].   Your physician wants you to follow-up in: 6 months with Dr Shirlee Latch. (February 2016). You will receive a reminder letter in the mail two months in advance. If you don't receive a letter, please call our office to schedule the follow-up appointment.

## 2014-02-23 NOTE — Telephone Encounter (Signed)
Message copied by Jacqlyn Krauss on Fri Feb 23, 2014  4:35 PM ------      Message from: Mariane Masters D      Created: Fri Feb 23, 2014 11:44 AM      Regarding: Azzie Almas       02/23/14 Thurston Hole, Neuro department will call patient with an appointment,Mrs Jares is aware. ------

## 2014-02-24 LAB — BASIC METABOLIC PANEL
BUN: 20 mg/dL (ref 6–23)
CO2: 29 mEq/L (ref 19–32)
Calcium: 9.6 mg/dL (ref 8.4–10.5)
Chloride: 98 mEq/L (ref 96–112)
Creatinine, Ser: 0.5 mg/dL (ref 0.4–1.2)
GFR: 141.52 mL/min (ref >60.00)
Glucose, Bld: 70 mg/dL (ref 70–99)
Potassium: 3.6 mEq/L (ref 3.5–5.1)
Sodium: 138 mEq/L (ref 135–145)

## 2014-02-24 NOTE — Progress Notes (Signed)
Patient ID: [name], female   DOB: [birth year], 53 y.o.   MRN: [id number] PCP: Dr. Corliss Blacker  53 yo with complicated past medical history presents for evaluation of her cardiomyopathy.  She has a long history of neuromuscular disorder.  The exact diagnosis remains undetermined.  She is confined to a wheelchair.  In 12/13, she was admitted to [facility] with hypotension, hypothermia, and altered mental status.   She was found to have profound anemia with hemoglobin down to 3.  She was transfused 4 units.  She also was found to have Enterococcal UTI and Serratia PNA.  She was intubated and eventually required tracheostomy.  During her hospitalization, she was found to have a cardiomyopathy with EF 20-25% by echo.  She was treated with milrinone.  Repeat echo in 5/14 showed EF improved to 50-55% with abnormal septal motion and last echo in 9/14 showed EF 55-60%.    Previously lasix was increased to 40 mg in am and 20 mg in pm along with 20 meq of potassium. I offered her a RHC at that time however she declined. PFTs obtained as noted below and showed very severe airway obstruction with subsequent evaluation by Dr [name].  At that time Dr [name] switched lisinopril to irbesartan. He felt like despite severity of her COPD main limitation was her neuromuscular condition.  Event monitor was worn in 1/15 due to tachypalpitations.  This showed PACs.   Since switching to irbesartan off ACEI, dyspnea has improved.  Spiriva has also helped breathing.  She has been stable.  Main issue has been generalized fatigue.  BP is low at times but she denies lightheadedness.   ECG: NSR, rSR' in V1  Labs (1/14): K 3.9, creatinine 0.21, HCT 30 Labs (4/14): K 4, creatinine 0.4, BNP 19 Labs (10/14): K 3.5, creatinine 0.5 Labs (12/14): K 3.6, creatinine 0.4 Labs (07/28/12): K 3.5 creatinine 0.5 Labs (08/01/13): K 3.5 Creatinine 0.5 Pro BNP 129  PMH: 1. Anorexia/cachexia/failure to thrive: Patient had a PEG tube.  2. Neuromuscular  disease: exact diagnosis is still undetermined despite extensive neurology evaluation.  She does not have multiple sclerosis.  3. Fibromyalgia 4. IBS 5. TMJ syndrome 6. PTSD 7. GERD 8. Sacral decubitus ulcer 9. H/o aspiration 10. 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  Echo (12/13) with EF 20-25% with diffuse hypokinesis, mild MR, moderate RV dilation and moderate RV dysfunction.  Echo (5/14) with EF 50-55%, abnormal septal motion.  Echo (9/14) with EF 55-60%.   11. Anemia 12. COPD: PFTs 08/01/13 with FEV1 0.98 liters (33%), FVC 1.87 (50%), FEV 1/FVC 52%, FEF 25-75% 0.65 liter.  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   SH: Lives with husband in [place].  Quit smoking > 20 years ago.  No ETOH.     FH: No history of cardiomyopathy.  Grandfather had MI at 62.   ROS: All systems reviewed and negative except as per HPI.    Current Outpatient Prescriptions  Medication Sig Dispense Refill  . Aluminum & Magnesium Hydroxide (MAG-AL PO) Take 250 mg by mouth.      . bisoprolol (ZEBETA) 5 MG tablet Take 1 tablet (5 mg total) by mouth daily.  30 tablet  6  . cholecalciferol (VITAMIN D) 1000 UNITS tablet Take 2,000 Units by mouth daily.       . furosemide (LASIX) 20 MG tablet Take 2 tablets in the am, take 1 tablet pm  90 tablet  6  . lactulose (CHRONULAC)  10 GM/15ML solution 30 g as directed.       . levalbuterol (XOPENEX HFA) 45 MCG/ACT inhaler Inhale 2 puffs into the lungs every 4 (four) hours as needed for wheezing.      . Multiple Vitamins-Minerals (CEROVITE ADVANCED FORMULA) LIQD Take 15 mLs by mouth daily.      Marland Kitchen omeprazole (PRILOSEC) 20 MG capsule Take 20 mg by mouth 2 (two) times daily.      . potassium chloride (KLOR-CON 10) 10 MEQ tablet Take 2 tablets (20 mEq total) by mouth daily.  60 tablet  6  . Tiotropium Bromide Monohydrate (SPIRIVA RESPIMAT) 2.5 MCG/ACT AERS 2 pffs each am  1 Inhaler  11  . Zinc 30 MG CAPS Take 1  capsule by mouth daily.        No current facility-administered medications for this visit.    BP 115/80  Pulse 70  Ht 5\' 5"  (1.651 m)  Wt 50.349 kg (111 lb)  BMI 18.47 kg/m2 General: NAD, thin.  Neck: No JVD, no thyromegaly or thyroid nodule.  Lungs: Clear to auscultation bilaterally with normal respiratory effort. Decreased BS throughout.  CV: Nondisplaced PMI.  Heart regular S1/S2, no S3/S4, no murmur.  No ankle edema.  No carotid bruit.  Normal pedal pulses.  Abdomen: Soft, nontender, no hepatosplenomegaly, mildly distended.  Neurologic: Alert and oriented x 3.  Psych: Normal affect. Extremities: No clubbing or cyanosis.   Assessment/Plan: 1. 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  Given recovery of EF and low blood pressure, I asked her to try stopping irbesartan.  She will continue bisoprolol and Lasix for now.  Check BMET/BNP today. 2. Dyspnea: Improved. Doubt cardiac in nature. Suspect combination of COPD and deconditioning due to neuromuscular disorder as well as upper airways syndrome from ACEI.  3. Neuromuscular disorder: Not fully characterized.  She is in a wheelchair most of the time but can walk with a walker. I recommended re-establishing with neurology.  I will make her a referral.  4. Palpitations: PACs on event monitor.  These are improved.    Artavia Jeanlouis,MD 02/24/2014

## 2014-03-06 ENCOUNTER — Telehealth: Payer: Self-pay | Admitting: Cardiology

## 2014-03-06 NOTE — Telephone Encounter (Signed)
LM for pt OK to continue irbesartan

## 2014-03-06 NOTE — Telephone Encounter (Signed)
She can stay on the irbesartan.

## 2014-03-06 NOTE — Telephone Encounter (Signed)
Pt called back to say her BP only went up to 88/61 after she had been off irbesartan for a week. She wanted Dr Shirlee Latch to know stopping irbesartan did not help increase her BP a lot.

## 2014-03-06 NOTE — Telephone Encounter (Signed)
New problem:    Per pt please give her a call back about her medications.

## 2014-03-06 NOTE — Telephone Encounter (Signed)
Pt states after being off irbesartan for 6 days she developed hard palpitations with swelling in her feet and face. She restarted 75mg  daily and is feeling much better. Her palpitations and swelling have resolved. Her SBP has been in the 80-83 range. She denies lightheadedness or dizziness and states she is tolerating this BP. She states she feels better when she is taking irbesartan.   I will forward to Dr Shirlee Latch for review.

## 2014-03-14 ENCOUNTER — Telehealth: Payer: Self-pay | Admitting: *Deleted

## 2014-03-14 NOTE — Telephone Encounter (Signed)
[name] - NEURO APPOINTMENT ','<More Detail >>       NEURO APPOINTMENT    [name]        Sent: Thu March 08, 2014 3:10 PM    To: Jacqlyn Krauss, RN                   Message     Pt does not want to make appt at this time. Thank you for the referral dana

## 2014-03-23 ENCOUNTER — Other Ambulatory Visit: Payer: Self-pay | Admitting: Family Medicine

## 2014-03-23 DIAGNOSIS — N6310 Unspecified lump in the right breast, unspecified quadrant: Principal | ICD-10-CM

## 2014-03-23 DIAGNOSIS — N6315 Unspecified lump in the right breast, overlapping quadrants: Secondary | ICD-10-CM

## 2014-03-29 ENCOUNTER — Ambulatory Visit
Admission: RE | Admit: 2014-03-29 | Discharge: 2014-03-29 | Disposition: A | Discharge status: Home / Self Care | Payer: Commercial Indemnity | Source: Ambulatory Visit | Attending: Family Medicine | Admitting: Family Medicine

## 2014-03-29 DIAGNOSIS — N6310 Unspecified lump in the right breast, unspecified quadrant: Principal | ICD-10-CM

## 2014-03-29 DIAGNOSIS — N6315 Unspecified lump in the right breast, overlapping quadrants: Secondary | ICD-10-CM

## 2014-04-25 ENCOUNTER — Ambulatory Visit: Payer: Commercial Indemnity | Admitting: Internal Medicine

## 2014-05-07 ENCOUNTER — Ambulatory Visit (INDEPENDENT_AMBULATORY_CARE_PROVIDER_SITE_OTHER): Payer: Commercial Indemnity | Admitting: Internal Medicine

## 2014-05-07 ENCOUNTER — Encounter: Payer: Self-pay | Admitting: Internal Medicine

## 2014-05-07 ENCOUNTER — Ambulatory Visit (INDEPENDENT_AMBULATORY_CARE_PROVIDER_SITE_OTHER)
Admission: RE | Admit: 2014-05-07 | Discharge: 2014-05-07 | Disposition: A | Discharge status: Home / Self Care | Payer: Commercial Indemnity | Source: Ambulatory Visit | Attending: Internal Medicine | Admitting: Internal Medicine

## 2014-05-07 VITALS — BP 90/60 | HR 66 | Ht 65.5 in | Wt 111.0 lb

## 2014-05-07 DIAGNOSIS — R0789 Other chest pain: Secondary | ICD-10-CM

## 2014-05-07 DIAGNOSIS — R0600 Dyspnea, unspecified: Secondary | ICD-10-CM

## 2014-05-07 MED ORDER — TIOTROPIUM BROMIDE MONOHYDRATE 2.5 MCG/ACT IN AERS: INHALATION_SPRAY | RESPIRATORY_TRACT | Status: DC

## 2014-05-07 MED ORDER — LEVALBUTEROL TARTRATE 45 MCG/ACT IN AERO: 2.0000 | INHALATION_SPRAY | Freq: Four times a day (QID) | RESPIRATORY_TRACT | Status: DC | PRN

## 2014-05-07 NOTE — Patient Instructions (Addendum)
zostrix cream(over the counter)  4 x daily x 2 weeks then taper off thereafter if effective   For sneezing if you can't avoid the source then try zyrtec 10 mg daily as needed  Please remember to go to the x-ray department downstairs for your tests - we will call you with the results when they are available.   If you are satisfied with your treatment plan,  let your doctor know and he/she can either refill your medications or you can return here when your prescription runs out.     If in any way you are not 100% satisfied,  please tell us.  If 100% better, tell your friends!  Pulmonary follow up is as needed

## 2014-05-07 NOTE — Progress Notes (Signed)
Subjective:    Patient ID: [name], female    DOB: 1961/01/13   MRN: [id number]    Brief patient profile:  53 yowf quit smoking 1989 then ? MS dx'd early 90's and "it's been steadily downhill since" mostly muscle related but since 2012 also with breathing difficulty > admit:  Admit date: 06/06/2012  Discharge date: 06/20/2012  Discharge Diagnoses:  Acute respiratory failure  CAP (community acquired pneumonia) vs aspiration pneumonia  Acute encephalopathy, resolved  Neuromuscular disorder: not fully defined.  Anemia  Thrombocytopenia  Coagulopathy  Acute liver failure  Enterococcal UTI (pansensitive)  Shock circulatory, suspect primarily cardiogenic  Sacral Pressure Ulcer  Dilated cardiomyopathy  Malnutrition compromising bodily function  H/O anorexia nervosa  Dysphagia    History of Present Illness  08/17/2013 1st [facility] office visit/ [name] was able do some adl/s with walker and no 02 summer of 2014  then downhill since  And now sob at rest  X 2 weeks and able to lie down at 45 degrees and can't tol lying flat at all for sev weeks assoc with dry cough day and night and dysphagia/ choking sensation on acei.  Already on gerd rx/ inhaler no benefit.  rec Stop lisinopril Avapro 75 mg one daily  Prilosec 20 mg Take 30- 60 min before your first and last meals of the day  GERD diet     09/15/2013 f/u ov/[name] re: acei vs copd  Chief Complaint  Patient presents with  . Follow-up    Pt states that her SOB/cough has improved greatly.  Pt still c/o SOB but is much better since lv.    limited more by fatigue and weakness than sob, swallowing fine now  rec spiriva 2puffs each am to see if your activity tolerance improves   10/27/2013 f/u ov/[name] re:  Chief Complaint  Patient presents with  . Follow-up    Pt states that her breathing is doing much better. She states that she was unable to use the xopenex HFA due to her hands not working well and does not like  neb b/c its too messy.   Only needing xopenex hfa once or twice a week since started  spiriva  Swallowing better  rec Only use your levoalbuterol (xopenex)       05/07/2014 f/u ov/[name] re: copd/ MS related muscle weakness  Chief Complaint  Patient presents with  . Follow-up    Pt states that her breathing is doing well.  No new co's today. She is using rescue inhaler approx 3 x per wk.    main time she needs saba is with perfume/ smoke exposure  Walker x across the room without stopping  Sneezing is more painful now than 6 months  Ago when first noted it   No obvious day to day or daytime variabilty or assoc chronic cough or cp or chest tightness, subjective wheeze overt sinus or hb symptoms. No unusual exp hx or h/o childhood pna/ asthma or knowledge of premature birth.  Sleeping ok without nocturnal  or early am exacerbation  of respiratory  c/o's or need for noct saba. Also denies any obvious fluctuation of symptoms with weather or environmental changes or other aggravating or alleviating factors except as outlined above   Current Medications, Allergies, Complete Past Medical History, Past Surgical History, Family History, and Social History were reviewed in [employer] record.  ROS  The following are not active complaints unless bolded sore throat, dysphagia, dental problems, itching, sneezing,  nasal congestion or excess/ purulent secretions, ear ache,   fever, chills, sweats, unintended wt loss, pleuritic or exertional cp, hemoptysis,  orthopnea pnd or leg swelling, presyncope, palpitations, heartburn, abdominal pain, anorexia, nausea, vomiting, diarrhea  or change in bowel or urinary habits, change in stools or urine, dysuria,hematuria,  rash, arthralgias, visual complaints, headache, numbness weakness or ataxia or problems with walking or coordination,  change in mood/affect or memory.                   Objective:   Physical Exam  Chronically  ill thin w/c bound wf nad   09/15/2013       113  > 10/27/2013   113 > 05/07/2014  111 Wt Readings from Last 3 Encounters:  08/17/13 113 lb (51.256 kg)  07/18/13 114 lb 6.4 oz (51.891 kg)  06/21/13 112 lb (50.803 kg)      HEENT: nl dentition, turbinates, and orophanx. Nl external ear canals without cough reflex   NECK :  without JVD/Nodes/TM/ nl carotid upstrokes bilaterally   LUNGS: no acc muscle use, clear to A and P bilaterally without cough on insp or exp maneuvers - no pain on palpation of chest wall   CV:  RRR  no s3 or murmur or increase in P2, no edema   ABD:  soft and nontender with nl excursion in the supine position. No bruits or organomegaly, bowel sounds nl  MS:  warm without deformities, calf tenderness, cyanosis or clubbing          CXR  05/07/2014 :  Hyperinflation without acute finding.     Assessment & Plan:

## 2014-05-08 NOTE — Assessment & Plan Note (Addendum)
-   sp smoking cessation in 1989 - see f/v loops 08/01/13 c/w upper airway obstruction  - trial off acei 08/18/2013 > marked improvement - 09/15/2013   Walked RA x about 50 ft and stopped due to  Fatigue s sob or desat - Spiriva trial 09/15/2013 > improved 10/27/13 so continued  Still not clear to me how much if any of her symptoms are related to copd since we don't really have a good FV loop on record but she's convinced she's so much better on it it's ok to continue esp since perceived need for saba is so minimal  If not continuing to do well next step is return with repeat pfts

## 2014-05-08 NOTE — Assessment & Plan Note (Signed)
She dates onset with thoracentesis on L so probably has a neuralgia related to injury to one of her intercostal nerves   rec trial of zostric cream/ gate theory of pain reviewed   See instructions for specific recommendations which were reviewed directly with the patient who was given a copy with highlighter outlining the key components.

## 2014-07-23 ENCOUNTER — Other Ambulatory Visit: Payer: Self-pay | Admitting: Internal Medicine

## 2014-07-23 ENCOUNTER — Other Ambulatory Visit: Payer: Self-pay | Admitting: *Deleted

## 2014-07-23 MED ORDER — IRBESARTAN 75 MG PO TABS: 75.0000 mg | ORAL_TABLET | Freq: Every day | ORAL | Status: DC

## 2014-08-29 ENCOUNTER — Encounter: Payer: Self-pay | Admitting: Cardiology

## 2014-08-29 ENCOUNTER — Ambulatory Visit (INDEPENDENT_AMBULATORY_CARE_PROVIDER_SITE_OTHER): Payer: Commercial Indemnity | Admitting: Cardiology

## 2014-08-29 VITALS — BP 110/56 | HR 64 | Ht 65.5 in | Wt 113.5 lb

## 2014-08-29 DIAGNOSIS — I5022 Chronic systolic (congestive) heart failure: Secondary | ICD-10-CM

## 2014-08-29 DIAGNOSIS — R0602 Shortness of breath: Secondary | ICD-10-CM

## 2014-08-29 DIAGNOSIS — R0600 Dyspnea, unspecified: Secondary | ICD-10-CM

## 2014-08-29 DIAGNOSIS — R1900 Intra-abdominal and pelvic swelling, mass and lump, unspecified site: Secondary | ICD-10-CM

## 2014-08-29 LAB — BASIC METABOLIC PANEL
BUN: 18 mg/dL (ref 6–23)
CO2: 30 mEq/L (ref 19–32)
Calcium: 9.7 mg/dL (ref 8.4–10.5)
Chloride: 102 mEq/L (ref 96–112)
Creatinine, Ser: 0.44 mg/dL (ref 0.40–1.20)
GFR: 159.91 mL/min (ref >60.00)
Glucose, Bld: 79 mg/dL (ref 70–99)
Potassium: 3.8 mEq/L (ref 3.5–5.1)
Sodium: 138 mEq/L (ref 135–145)

## 2014-08-29 LAB — BRAIN NATRIURETIC PEPTIDE: Pro B Natriuretic peptide (BNP): 25 pg/mL (ref 0.0–100.0)

## 2014-08-29 MED ORDER — IRBESARTAN 75 MG PO TABS: 75.0000 mg | ORAL_TABLET | Freq: Every day | ORAL | Status: DC

## 2014-08-29 MED ORDER — FUROSEMIDE 20 MG PO TABS: ORAL_TABLET | ORAL | Status: DC

## 2014-08-29 MED ORDER — BISOPROLOL FUMARATE 5 MG PO TABS: 5.0000 mg | ORAL_TABLET | Freq: Every day | ORAL | Status: DC

## 2014-08-29 MED ORDER — POTASSIUM CHLORIDE ER 10 MEQ PO TBCR: 20.0000 meq | EXTENDED_RELEASE_TABLET | Freq: Every day | ORAL | Status: DC

## 2014-08-29 NOTE — Patient Instructions (Signed)
Your physician recommends that you have lab today--BMET/BNP.  Your physician wants you to follow-up in: 6 months with Dr Shirlee Latch. (August 2016).  You will receive a reminder letter in the mail two months in advance. If you don't receive a letter, please call our office to schedule the follow-up appointment.

## 2014-08-29 NOTE — Progress Notes (Signed)
Patient ID: [name], female   DOB: [birth year], 54 y.o.   MRN: [id number] PCP: Dr. Corliss Blacker  54 y.o. with complicated past medical history presents for evaluation of her cardiomyopathy.  She has a long history of neuromuscular disorder.  The exact diagnosis remains undetermined.  She is confined to a wheelchair.  In 12/13, she was admitted to [facility] with hypotension, hypothermia, and altered mental status.   She was found to have profound anemia with hemoglobin down to 3.  She was transfused 4 units.  She also was found to have Enterococcal UTI and Serratia PNA.  She was intubated and eventually required tracheostomy.  During her hospitalization, she was found to have a cardiomyopathy with EF 20-25% by echo.  She was treated with milrinone.  Repeat echo in 5/14 showed EF improved to 50-55% with abnormal septal motion and last echo in 9/14 showed EF 55-60%.    Previously lasix was increased to 40 mg in am and 20 mg in pm along with 20 meq of potassium. I offered her a RHC at that time however she declined. PFTs obtained as noted below and showed severe airway obstruction with subsequent evaluation by Dr [name].  At that time Dr [name] switched lisinopril to irbesartan. He felt like despite severity of her COPD, main limitation was her neuromuscular condition.  Event monitor was worn in 1/15 due to tachypalpitations.  This showed PACs.   Recently, she has been doing well.  We tried to take her off irbesartan but she felt worse off this medication so restarted it.  Her breathing is stable, no significant exertional dyspnea though not very active.  No chest pain, no orthopnea or PND.    ECG: NSR, normal  Labs (1/14): K 3.9, creatinine 0.21, HCT 30 Labs (4/14): K 4, creatinine 0.4, BNP 19 Labs (10/14): K 3.5, creatinine 0.5 Labs (12/14): K 3.6, creatinine 0.4 Labs (07/28/12): K 3.5 creatinine 0.5 Labs (08/01/13): K 3.5 Creatinine 0.5 Pro BNP 129  PMH: 1. Anorexia/cachexia/failure to thrive: Patient had a  PEG tube.  2. Neuromuscular disease: exact diagnosis is still undetermined despite extensive neurology evaluation.  She does not have multiple sclerosis.  3. Fibromyalgia 4. IBS 5. TMJ syndrome 6. PTSD 7. GERD 8. Sacral decubitus ulcer 9. H/o aspiration 10. 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  Echo (12/13) with EF 20-25% with diffuse hypokinesis, mild MR, moderate RV dilation and moderate RV dysfunction.  Echo (5/14) with EF 50-55%, abnormal septal motion.  Echo (9/14) with EF 55-60%.   11. Anemia 12. COPD: PFTs 08/01/13 with FEV1 0.98 liters (33%), FVC 1.87 (50%), FEV 1/FVC 52%, FEF 25-75% 0.65 liter.  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  Quit smoking > 20 years ago.  No ETOH.     FH: No history of cardiomyopathy.  Grandfather had MI at 63.   ROS: All systems reviewed and negative except as per HPI.    Current Outpatient Prescriptions  Medication Sig Dispense Refill  . Aluminum & Magnesium Hydroxide (MAG-AL PO) Take 250 mg by mouth.    . bisoprolol (ZEBETA) 5 MG tablet Take 1 tablet (5 mg total) by mouth daily. 30 tablet 11  . cholecalciferol (VITAMIN D) 1000 UNITS tablet Take 2,000 Units by mouth daily.     . furosemide (LASIX) 20 MG tablet Take 2 tablets in the am, take 1 tablet pm 90 tablet 11  .  irbesartan (AVAPRO) 75 MG tablet Take 1 tablet (75 mg total) by mouth daily. 30 tablet 11  . Multiple Vitamins-Minerals (CEROVITE ADVANCED FORMULA) LIQD Take 15 mLs by mouth daily.    Marland Kitchen omeprazole (PRILOSEC) 20 MG capsule Take 20 mg by mouth 2 (two) times daily.    . potassium chloride (KLOR-CON 10) 10 MEQ tablet Take 2 tablets (20 mEq total) by mouth daily. 60 tablet 11  . Tiotropium Bromide Monohydrate (SPIRIVA RESPIMAT) 2.5 MCG/ACT AERS 2 pffs each am 1 Inhaler 11  . XOPENEX HFA 45 MCG/ACT inhaler USE 2 PUFFS EVERY 6 HOURS AS NEEDED FOR WHEEZING.  15 g 0  . Zinc 30 MG CAPS Take 1 capsule by mouth daily.      No current facility-administered medications for this visit.    BP 110/56 mmHg  Pulse 64  Ht 5' 5.5" (1.664 m)  Wt 113 lb 8 oz (51.483 kg)  BMI 18.59 kg/m2 General: NAD, thin.  Neck: No JVD, no thyromegaly or thyroid nodule.  Lungs: Clear to auscultation bilaterally with normal respiratory effort. Decreased BS throughout.  CV: Nondisplaced PMI.  Heart regular S1/S2, no S3/S4, no murmur.  No ankle edema.  No carotid bruit.  Normal pedal pulses.  Abdomen: Soft, nontender, no hepatosplenomegaly, mildly distended.  Neurologic: Alert and oriented x 3.  Psych: Normal affect. Extremities: No clubbing or cyanosis.   Assessment/Plan: 1. 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  She will continue bisoprolol, irbesartan, and Lasix.  She did not tolerate coming off irbesartan.  Check BMET/BNP today.  2. Dyspnea: Improved. Doubt cardiac in nature. Suspect combination of COPD and deconditioning due to neuromuscular disorder as well as upper airways syndrome from ACEI.  3. Neuromuscular disorder: Not fully characterized.  She is in a wheelchair most of the time but can walk with a walker. I offered to refer her back to neurology after last appointment but she declined.  4. Palpitations: PACs on event monitor.  These are improved.   Followup 6 months.    Maiana Hennigan Gaylyn Cheers 08/29/2014

## 2014-09-05 ENCOUNTER — Ambulatory Visit (INDEPENDENT_AMBULATORY_CARE_PROVIDER_SITE_OTHER)
Admission: RE | Admit: 2014-09-05 | Discharge: 2014-09-05 | Disposition: A | Discharge status: Home / Self Care | Payer: Commercial Indemnity | Source: Ambulatory Visit | Attending: Internal Medicine | Admitting: Internal Medicine

## 2014-09-05 ENCOUNTER — Ambulatory Visit (INDEPENDENT_AMBULATORY_CARE_PROVIDER_SITE_OTHER): Payer: Commercial Indemnity | Admitting: Internal Medicine

## 2014-09-05 ENCOUNTER — Encounter: Payer: Self-pay | Admitting: Internal Medicine

## 2014-09-05 DIAGNOSIS — R0600 Dyspnea, unspecified: Secondary | ICD-10-CM

## 2014-09-05 MED ORDER — LEVALBUTEROL TARTRATE 45 MCG/ACT IN AERO: INHALATION_SPRAY | RESPIRATORY_TRACT | Status: DC

## 2014-09-05 MED ORDER — FLUTTER DEVI: Status: DC

## 2014-09-05 MED ORDER — AMOXICILLIN-POT CLAVULANATE 875-125 MG PO TABS: 1.0000 | ORAL_TABLET | Freq: Two times a day (BID) | ORAL | Status: DC

## 2014-09-05 MED ORDER — METHYLPREDNISOLONE ACETATE 80 MG/ML IJ SUSP
120.0000 mg | Freq: Once | INTRAMUSCULAR | Status: AC
  Administered 2014-09-05: 120 mg via INTRAMUSCULAR

## 2014-09-05 NOTE — Patient Instructions (Addendum)
Change omeprazole to where you take 20mg  x 2 Take 30- 60 min before your first and last meals of the day   GERD (REFLUX)  is an extremely common cause of respiratory symptoms just like yours , many times with no obvious heartburn at all.    It can be treated with medication, but also with lifestyle changes including avoidance of late meals, excessive alcohol, smoking cessation, and avoid fatty foods, chocolate, peppermint, colas, red wine, and acidic juices such as orange juice.  NO MINT OR MENTHOL PRODUCTS SO NO COUGH DROPS  USE SUGARLESS CANDY INSTEAD (Jolley ranchers or Stover's or Life Savers) or even ice chips will also do - the key is to swallow to prevent all throat clearing. NO OIL BASED VITAMINS - use powdered substitutes.  Try flutter valve as needed for cough or congestion > ok to cough into the valve if helps  Please remember to go to the x-ray department downstairs for your tests - we will call you with the results when they are available.  Depomedrol 120 mg IM today    For nasty mucus > Augmentin 875 mg take one pill twice daily  X 10 days - take at breakfast and supper with large glass of water.  It would help reduce the usual side effects (diarrhea and yeast infections) if you ate cultured yogurt at lunch.    Only use your albuterol as a rescue medication to be used if you can't catch your breath by resting or doing a relaxed purse lip breathing pattern.  - The less you use it, the better it will work when you need it. - Ok to use up to 2 puffs  every 4 hours if you must but call for immediate appointment if use goes up over your usual need - Don't leave home without it !!  (think of it like the spare tire for your car)   Follow up is as needed

## 2014-09-05 NOTE — Progress Notes (Addendum)
Subjective:    Patient ID: TABRIA MUA, female    DOB: 1960/12/17   MRN: [id number]    Brief patient profile:  53 yowf quit smoking 1989 then ? MS dx'd early 90's and "it's been steadily downhill since" mostly muscle related but since 2012 also with breathing difficulty > admit:  Admit date: 06/06/2012  Discharge date: 06/20/2012  Discharge Diagnoses:  Acute respiratory failure  CAP (community acquired pneumonia) vs aspiration pneumonia  Acute encephalopathy, resolved  Neuromuscular disorder: not fully defined.  Anemia  Thrombocytopenia  Coagulopathy  Acute liver failure  Enterococcal UTI (pansensitive)  Shock circulatory, suspect primarily cardiogenic  Sacral Pressure Ulcer  Dilated cardiomyopathy  Malnutrition compromising bodily function  H/O anorexia nervosa  Dysphagia    History of Present Illness  08/17/2013 1st [facility] office visit/ [name] was able do some adl/s with walker and no 02 summer of 2014  then downhill since  And now sob at rest  X 2 weeks and able to lie down at 45 degrees and can't tol lying flat at all for sev weeks assoc with dry cough day and night and dysphagia/ choking sensation on acei.  Already on gerd rx/ inhaler no benefit.  rec Stop lisinopril Avapro 75 mg one daily  Prilosec 20 mg Take 30- 60 min before your first and last meals of the day  GERD diet     09/15/2013 f/u ov/Tayon Parekh re: acei vs copd  Chief Complaint  Patient presents with  . Follow-up    Pt states that her SOB/cough has improved greatly.  Pt still c/o SOB but is much better since lv.    limited more by fatigue and weakness than sob, swallowing fine now  rec spiriva 2puffs each am to see if your activity tolerance improves> improved so maint rx    10/27/2013 f/u ov/Kari Kerth re:  Chief Complaint  Patient presents with  . Follow-up    Pt states that her breathing is doing much better. She states that she was unable to use the xopenex HFA due to her hands not working  well and does not like neb b/c its too messy.   Only needing xopenex hfa once or twice a week since started  spiriva  Swallowing better  rec Only use your levoalbuterol (xopenex) prn       05/07/2014 f/u ov/Eder Macek re: copd/ MS related muscle weakness  Chief Complaint  Patient presents with  . Follow-up    Pt states that her breathing is doing well.  No new co's today. She is using rescue inhaler approx 3 x per wk.   Main time she needs saba is with perfume/ smoke exposure  Walker x across the room without stopping Sneezing is more painful now than 6 months  Ago when first noted it rec zostrix cream(over the counter)  4 x daily x 2 weeks then taper off thereafter if effective  For sneezing if you can't avoid the source then try zyrtec 10 mg daily as needed Pulmonary follow up is as needed     09/05/2014 acute ov/Dresden Lozito re: worse sob/increase need for saba Chief Complaint  Patient presents with  . Acute Visit    Pt c/o increased SOB- started mid Nov 2015, worse for the past month. She is using xopenex 2-3 x per day on average. She c/o cough for the past month- sometimes prod with clear yellow/green sputum. She has had some occ bloody nasal d/c.  Has not been able to lie down for several  years, sitting upright in hosp bed due to sob Swallowing worse "because cough is worse" despite  on prilosec 20 bid with meals  Denies choking but all foods are pureed.  No obvious day to day or daytime variabilty or assoc  cp or chest tightness, subjective wheeze overt sinus or hb symptoms. No unusual exp hx or h/o childhood pna/ asthma or knowledge of premature birth.  Sleeping ok without nocturnal  or early am exacerbation  of respiratory  c/o's or need for noct saba. Also denies any obvious fluctuation of symptoms with weather or environmental changes or other aggravating or alleviating factors except as outlined above   Current Medications, Allergies, Complete Past Medical History, Past Surgical  History, Family History, and Social History were reviewed in [employer] record.  ROS  The following are not active complaints unless bolded sore throat, dysphagia, dental problems, itching, sneezing,  nasal congestion or excess/ purulent secretions, ear ache,   fever, chills, sweats, unintended wt loss, pleuritic or exertional cp, hemoptysis,  orthopnea pnd or leg swelling, presyncope, palpitations, heartburn, abdominal pain, anorexia, nausea, vomiting, diarrhea  or change in bowel or urinary habits, change in stools or urine, dysuria,hematuria,  rash, arthralgias, visual complaints, headache, numbness weakness or ataxia or problems with walking or coordination,  change in mood/affect or memory.             Objective:   Physical Exam  Chronically ill thin w/c bound wf nad   09/15/2013       113  > 10/27/2013   113 > 05/07/2014  111>  112 09/05/2014   Wt Readings from Last 3 Encounters:  08/17/13 113 lb (51.256 kg)  07/18/13 114 lb 6.4 oz (51.891 kg)  06/21/13 112 lb (50.803 kg)      HEENT: nl dentition, turbinates, and orophanx. Nl external ear canals without cough reflex   NECK :  without JVD/Nodes/TM/ nl carotid upstrokes bilaterally   LUNGS: no acc muscle use, clear to A and P bilaterally without cough on insp or exp maneuvers - no pain on palpation of chest wall   CV:  RRR  no s3 or murmur or increase in P2, no edema   ABD:  soft and nontender with nl excursion in the supine position. No bruits or organomegaly, bowel sounds nl  MS:  warm without deformities, calf tenderness, cyanosis or clubbing      I personally reviewed images and agree with radiology impression as follows:  CXR: 09/05/14 Hyperinflation consistent with history of COPD. No acute abnormalities.     Assessment & Plan:

## 2014-09-06 ENCOUNTER — Encounter: Payer: Self-pay | Admitting: Internal Medicine

## 2014-09-06 NOTE — Progress Notes (Signed)
Quick Note:  Spoke with pt and notified of results per Dr. Wert. Pt verbalized understanding and denied any questions.  ______ 

## 2014-09-06 NOTE — Assessment & Plan Note (Signed)
-   sp smoking cessation in 1989 - see f/v loops 08/01/13 c/w upper airway obstruction  - trial off acei 08/18/2013 > marked improvement - 09/15/2013   Walked RA x about 50 ft and stopped due to  Fatigue s sob or desat - Spiriva trial 09/15/2013 > improved 10/27/13 so continue - 09/05/2014 p extensive coaching HFA effectiveness =  50%  At most  - added flutter valve 09/05/2014    I had an extended discussion with the patient reviewing all relevant studies completed to date and  lasting 15 to 20 minutes of a 25 minute visit on the following ongoing concerns:   1) MS does not usually effect the insp muscles until really endstage, which she may be approaching  2)most of the problems relate to swallowing issues, and she clearly has that   3) therefore rec max gerd rx/ diet then consider repeat st eval next step   4) LAMA not available in neb form but her ability to use hfa / respimat appears limited/ will need to consider duoneb an option as maint though then run some risk of tachyphylaxis from saba and may been pulmicort to offset that to some extent  Really have no other great options at this point   Will add augmentin as a prn for nasty mucus and flutter valve to see if that helps

## 2014-09-26 ENCOUNTER — Telehealth: Payer: Self-pay | Admitting: Internal Medicine

## 2014-09-26 MED ORDER — PREDNISONE 10 MG PO TABS: ORAL_TABLET | ORAL | Status: DC

## 2014-09-26 NOTE — Telephone Encounter (Signed)
Spoke with the pt and notified of recs per MW  She verbalized understanding  Rx was sent to pharm  

## 2014-09-26 NOTE — Telephone Encounter (Signed)
Pt cb states paramedics just left her and she needed to tell nurse what they said, I told her I would send message back for nurse to call her back to obtain information from her and she hung up.

## 2014-09-26 NOTE — Telephone Encounter (Signed)
Spoke with pt. Reports increased coughing. Has been following all of MW's instructions from her last OV. She did not take Augmentin due to the fact that her mucus never got "nasty." While on the phone she had to stop talking several times to cough.  MW - please advise. Thanks.

## 2014-09-26 NOTE — Telephone Encounter (Signed)
Patient says that the DepoMedrol shot wore off and she got worse in the pollen.  She says she waited too long to use rescue inhaler.  She said she felt like she was going to black out, she was afraid she would pass out so she called EMS.  After EMS gave her the oxygen she felt a lot better.  They kept her on the oxygen for approximately 15 minutes.  She refused to be taken to the hospital so EMS is no longer there.  She said her breathing feels better now, but she wants to know what Dr. [name] recommends for her.

## 2014-09-26 NOTE — Telephone Encounter (Signed)
Prednisone 10 mg take  4 each am x 2 days,   2 each am x 2 days,  1 each am x 2 days and stop is basically the same as the depomedrol in terms of total dose and see if this gets her back to baseline otherwise needs to be seen before weekend

## 2014-10-25 ENCOUNTER — Encounter: Payer: Self-pay | Admitting: Internal Medicine

## 2014-10-25 ENCOUNTER — Ambulatory Visit (INDEPENDENT_AMBULATORY_CARE_PROVIDER_SITE_OTHER): Payer: Commercial Indemnity | Admitting: Internal Medicine

## 2014-10-25 DIAGNOSIS — R0600 Dyspnea, unspecified: Secondary | ICD-10-CM

## 2014-10-25 MED ORDER — MOMETASONE FURO-FORMOTEROL FUM 100-5 MCG/ACT IN AERO: INHALATION_SPRAY | RESPIRATORY_TRACT | Status: DC

## 2014-10-25 NOTE — Patient Instructions (Addendum)
Please see patient coordinator before you leave today  to schedule overnight oximetry  asap and modified barium swallow  Add automatically Dulera 100 Take 2 puffs first thing in am and then another 2 puffs about 12 hours later.   Plan B = backup  Only use your levoalbuterol as a rescue medication to be used if you can't catch your breath by resting or doing a relaxed purse lip breathing pattern.  - The less you use it, the better it will work when you need it. - Ok to use up to 2 puffs  every 4 hours if you must but call for immediate appointment if use goes up over your usual need - Don't leave home without it !!  (think of it like the spare tire for your car)   Cough into the flutter valve if having trouble getting anything up   Please schedule a follow up office visit in 6 weeks, call sooner if needed with pfts on return

## 2014-10-25 NOTE — Progress Notes (Signed)
Subjective:    Patient ID: [name], female    DOB: 1960/09/13   MRN: [id number]    Brief patient profile:  53 yowf quit smoking 1989 then ? MS dx'd early 90's and "it's been steadily downhill since" mostly muscle related but since 2012 also with breathing difficulty > admit:  Admit date: 06/06/2012  Discharge date: 06/20/2012  Discharge Diagnoses:  Acute respiratory failure  CAP (community acquired pneumonia) vs aspiration pneumonia  Acute encephalopathy, resolved  Neuromuscular disorder: not fully defined.  Anemia  Thrombocytopenia  Coagulopathy  Acute liver failure  Enterococcal UTI (pansensitive)  Shock circulatory, suspect primarily cardiogenic  Sacral Pressure Ulcer  Dilated cardiomyopathy  Malnutrition compromising bodily function  H/O anorexia nervosa  Dysphagia    History of Present Illness  08/17/2013 1st [facility] office visit/ [name] was able do some adl/s with walker and no 02 summer of 2014  then downhill since  And now sob at rest  X 2 weeks and able to lie down at 45 degrees and can't tol lying flat at all for sev weeks assoc with dry cough day and night and dysphagia/ choking sensation on acei.  Already on gerd rx/ inhaler no benefit.  rec Stop lisinopril Avapro 75 mg one daily  Prilosec 20 mg Take 30- 60 min before your first and last meals of the day  GERD diet     09/15/2013 f/u ov/Melinda Pottinger re: acei vs copd  Chief Complaint  Patient presents with  . Follow-up    Pt states that her SOB/cough has improved greatly.  Pt still c/o SOB but is much better since lv.    limited more by fatigue and weakness than sob, swallowing fine now  rec spiriva 2puffs each am to see if your activity tolerance improves> improved so maint rx    10/27/2013 f/u ov/Rhyan Radler re:  Chief Complaint  Patient presents with  . Follow-up    Pt states that her breathing is doing much better. She states that she was unable to use the xopenex HFA due to her hands not working  well and does not like neb b/c its too messy.   Only needing xopenex hfa once or twice a week since started  spiriva  Swallowing better  rec Only use your levoalbuterol (xopenex) prn       05/07/2014 f/u ov/Jakaiden Fill re: copd/ MS related muscle weakness  Chief Complaint  Patient presents with  . Follow-up    Pt states that her breathing is doing well.  No new co's today. She is using rescue inhaler approx 3 x per wk.   Main time she needs saba is with perfume/ smoke exposure  Walker x across the room without stopping Sneezing is more painful now than 6 months  Ago when first noted it rec zostrix cream(over the counter)  4 x daily x 2 weeks then taper off thereafter if effective  For sneezing if you can't avoid the source then try zyrtec 10 mg daily as needed Pulmonary follow up is as needed     09/05/2014 acute ov/Tanaja Ganger re: worse sob/increase need for saba Chief Complaint  Patient presents with  . Acute Visit    Pt c/o increased SOB- started mid Nov 2015, worse for the past month. She is using xopenex 2-3 x per day on average. She c/o cough for the past month- sometimes prod with clear yellow/green sputum. She has had some occ bloody nasal d/c.  Has not been able to lie down for several  years, sitting upright in hosp bed due to sob Swallowing worse "because cough is worse" despite  on prilosec 20 bid with meals  Denies choking but all foods are pureed. rec Change omeprazole to where you take 20mg  x 2 Take 30- 60 min before your first and last meals of the day  GERD   Try flutter valve as needed for cough or congestion > ok to cough into the valve if helps   10/25/2014 f/u ov/[employer] re: mild copd/ MS with chronic dysphagia/ off pred x 21 days prior to OV   Chief Complaint  Patient presents with  . Acute Visit    Pt c/o decreased o2 sats since 09/26/14- feels fatigued and more SOB. She states that she is also aspirating more and got choked drinking water recently.   while on prednisone did  not use any xopenex hfa but prior was using it up 3 x daily  On prednisone cough also goes away  But worse in am's without it. Thinks she may be aspirating more since Nov 2015 >   Convinced her copd is progressing based on her readings Mucus is minimal, no food in it   No obvious patterns in day to day or daytime variabilty or assoc  cp or chest tightness, subjective wheeze overt sinus or hb symptoms. No unusual exp hx or h/o childhood pna/ asthma or knowledge of premature birth.  Sleeping ok without nocturnal  or early am exacerbation  of respiratory  c/o's or need for noct saba. Also denies any obvious fluctuation of symptoms with weather or environmental changes or other aggravating or alleviating factors except as outlined above   Current Medications, Allergies, Complete Past Medical History, Past Surgical History, Family History, and Social History were reviewed in [employer] record.  ROS  The following are not active complaints unless bolded sore throat, dysphagia, dental problems, itching, sneezing,  nasal congestion or excess/ purulent secretions, ear ache,   fever, chills, sweats, unintended wt loss, pleuritic or exertional cp, hemoptysis,  orthopnea pnd or leg swelling, presyncope, palpitations, heartburn, abdominal pain, anorexia, nausea, vomiting, diarrhea  or change in bowel or urinary habits, change in stools or urine, dysuria,hematuria,  rash, arthralgias, visual complaints, headache, numbness weakness or ataxia or problems with walking or coordination,  change in mood/affect or memory.             Objective:   Physical Exam  Chronically ill thin w/c bound very anxious wf nad   09/15/2013       113  > 10/27/2013   113 > 05/07/2014  111>  112 09/05/2014  > 10/25/2014  110  Wt Readings from Last 3 Encounters:  08/17/13 113 lb (51.256 kg)  07/18/13 114 lb 6.4 oz (51.891 kg)  06/21/13 112 lb (50.803 kg)      HEENT: nl dentition, turbinates, and orophanx.  Nl external ear canals without cough reflex   NECK :  without JVD/Nodes/TM/ nl carotid upstrokes bilaterally   LUNGS: no acc muscle use, clear to A and P bilaterally without cough on insp or exp maneuvers - no pain on palpation of chest wall   CV:  RRR  no s3 or murmur or increase in P2, no edema   ABD:  soft and nontender with nl excursion in the supine position. No bruits or organomegaly, bowel sounds nl  MS:  warm without deformities, calf tenderness, cyanosis or clubbing      I personally reviewed images and agree with radiology impression  as follows:  CXR: 09/05/14 Hyperinflation consistent with history of COPD. No acute abnormalities.     Assessment & Plan:

## 2014-10-26 ENCOUNTER — Encounter: Payer: Self-pay | Admitting: Internal Medicine

## 2014-10-26 ENCOUNTER — Other Ambulatory Visit (HOSPITAL_COMMUNITY): Payer: Self-pay | Admitting: Internal Medicine

## 2014-10-26 DIAGNOSIS — R1314 Dysphagia, pharyngoesophageal phase: Secondary | ICD-10-CM

## 2014-10-26 NOTE — Assessment & Plan Note (Signed)
-   sp smoking cessation in 1989 - see f/v loops 08/01/13 c/w upper airway obstruction  - trial off acei 08/18/2013 > marked improvement - 09/15/2013   Walked RA x about 50 ft and stopped due to  Fatigue s sob or desat - Spiriva trial 09/15/2013 > improved 10/27/13 so continue - added flutter valve 09/05/2014    Symptoms are markedly disproportionate to objective findings and not clear this is a lung problem but pt does appear to have difficult airway management issues. DDX of  difficult airways management all start with A and  include Adherence, Ace Inhibitors, Acid Reflux, Active Sinus Disease, Alpha 1 Antitripsin deficiency, Anxiety masquerading as Airways dz,  ABPA,  allergy(esp in young), Aspiration (esp in elderly), Adverse effects of DPI,  Active smokers, plus two Bs  = Bronchiectasis and Beta blocker use..and one C= CHF  Adherence is always the initial "prime suspect" and is a multilayered concern that requires a "trust but verify" approach in every patient - starting with knowing how to use medications, especially inhalers, correctly, keeping up with refills and understanding the fundamental difference between maintenance and prns vs those medications only taken for a very short course and then stopped and not refilled.  The proper method of use, as well as anticipated side effects, of a metered-dose inhaler are discussed and demonstrated to the patient. Improved effectiveness after extensive coaching during this visit to a level of approximately  75% with hfa > try adding dulera 100 2bid  ? Acid (or non-acid) GERD > always difficult to exclude as up to 75% of pts in some series report no assoc GI/ Heartburn symptoms> rec continue max (24h)  acid suppression and diet restrictions/ reviewed     ? Asp > barium swallow   ? Allergy/ asthma vs vcd so need to use the lowest ics possible > dulera 100 then consider step down to qvar p pfts   ? chf > excluded with bnp of only 25 on 08/29/14  Pt requesting  home 02 but does not qualify based on office sats > will do ono RA

## 2014-10-29 ENCOUNTER — Encounter: Payer: Self-pay | Admitting: Internal Medicine

## 2014-10-29 ENCOUNTER — Ambulatory Visit (HOSPITAL_COMMUNITY)
Admission: RE | Admit: 2014-10-29 | Discharge: 2014-10-29 | Disposition: A | Discharge status: Home / Self Care | Payer: Commercial Indemnity | Source: Ambulatory Visit | Attending: Internal Medicine | Admitting: Internal Medicine

## 2014-10-29 DIAGNOSIS — R1314 Dysphagia, pharyngoesophageal phase: Secondary | ICD-10-CM | POA: Diagnosis present

## 2014-10-29 DIAGNOSIS — M797 Fibromyalgia: Secondary | ICD-10-CM | POA: Insufficient documentation

## 2014-10-29 DIAGNOSIS — F4310 Post-traumatic stress disorder, unspecified: Secondary | ICD-10-CM | POA: Insufficient documentation

## 2014-10-29 DIAGNOSIS — K219 Gastro-esophageal reflux disease without esophagitis: Secondary | ICD-10-CM | POA: Diagnosis not present

## 2014-10-29 DIAGNOSIS — G35 Multiple sclerosis: Secondary | ICD-10-CM | POA: Diagnosis not present

## 2014-10-29 DIAGNOSIS — K589 Irritable bowel syndrome without diarrhea: Secondary | ICD-10-CM | POA: Insufficient documentation

## 2014-10-29 DIAGNOSIS — R0600 Dyspnea, unspecified: Secondary | ICD-10-CM | POA: Diagnosis not present

## 2014-10-29 DIAGNOSIS — R1312 Dysphagia, oropharyngeal phase: Secondary | ICD-10-CM | POA: Diagnosis not present

## 2014-10-29 NOTE — Procedures (Cosign Needed)
Objective Swallowing Evaluation: Modified Barium Swallowing Study  Patient Details  Name: [name] MRN: [id number] Date of Birth: 08/04/1960  Today's Date: 10/29/2014 Time: SLP Start Time (ACUTE ONLY): 1130-SLP Stop Time (ACUTE ONLY): 1205 SLP Time Calculation (min) (ACUTE ONLY): 35 min  Past Medical History:  Past Medical History  Diagnosis Date  . MS (multiple sclerosis)   . IBS (irritable bowel syndrome)   . PTSD (post-traumatic stress disorder)   . Fibromyalgia syndrome   . TMJ disease   . Difficult intubation     secondary to jaw muscles  . Fibromyalgia   . GERD (gastroesophageal reflux disease)   . Anorexia nervosa     h/o   . CAP (community acquired pneumonia)   . Anemia   . Shock circulatory   . Acute liver failure   . Cardiomyopathy   . Sacral decubitus ulcer    Past Surgical History:  Past Surgical History  Procedure Laterality Date  . Cesarean section    . Bunionectomy    . Dilation and curettage of uterus  2007  . Tubal ligation    . Tracheostomy  06/2012   HPI:  HPI: 54 year old female referred for OP MBS due to pt complaints of ongoing dysphagia and dyspnea.  PMH:  TMJ, depression, anorexia, IBS, Fibromayalgia, ?MS, prolonged hospitalization and stay at [facility] following sepsis, encephalopathy, prolonged intubation (worsened TMJ) , trach and PEG (all in 2014).  Pt no longer has PEG but continues to report she aspirates and is unable to chew (the later due to her TMJ) .  It was noted that the pt barely opens mouth to accept po's, (and when asked to open mouth as wide as possible) but was observed opening mouth much more when speaking.  Pt appears anxious and very concerned about her swallowing ability.  No Data Recorded  Assessment / Plan / Recommendation CHL IP CLINICAL IMPRESSIONS 10/29/2014  Dysphagia Diagnosis Moderate oral phase dysphagia;Mild pharyngeal phase dysphagia  Clinical impression Pt now exhibits a moderate oral phase with inability  to chew soft solids due to TMJ (states she can not open and close mouth for chewing).  There is decreased tongue base contraction to the pharyngeal wall, resulting in mild residue of puree and liquids in the valleculae and pyriforms, that clears with a spontaneous dry swallow.  A chin tuck decreased the residue as well.  There was no aspiration or penetration observed during this study, however, the pt reports frequent fatigue during meals, with worsening of the swallow near the end of a meal.  She also states her dsypnea causes her to choke easily when tired. Pt was encouraged to attempt some foods from Dysphagia 2 diet as able, and to take frequent rest breaks during meals.       CHL IP TREATMENT RECOMMENDATION 10/29/2014  Treatment Plan Recommendations No treatment recommended at this time     CHL IP DIET RECOMMENDATION 10/29/2014  Diet Recommendations Dysphagia 1 (Puree);Thin liquid;Dysphagia 2 (Fine chop)  Liquid Administration via Cup  Medication Administration Crushed with puree  Compensations Slow rate;Small sips/bites;Multiple dry swallows after each bite/sip  Postural Changes and/or Swallow Maneuvers Seated upright 90 degrees;Chin tuck     CHL IP OTHER RECOMMENDATIONS 10/29/2014  Recommended Consults (No Data)  Oral Care Recommendations Oral care BID  Other Recommendations Clarify dietary restrictions     CHL IP FOLLOW UP RECOMMENDATIONS 10/29/2014  Follow up Recommendations None     CHL IP FREQUENCY AND DURATION 06/20/2012  Speech Therapy Frequency (  ACUTE ONLY) min 2x/week  Treatment Duration (None)     Pertinent Vitals/Pain n/a    SLP Swallow Goals No flowsheet data found.  No flowsheet data found.    CHL IP REASON FOR REFERRAL 10/29/2014  Reason for Referral Objectively evaluate swallowing function     CHL IP ORAL PHASE 10/29/2014  Lips (None)  Tongue (None)  Mucous membranes (None)  Nutritional status (None)  Other (None)  Oxygen therapy (None)  Oral Phase Impaired   Oral - Pudding Teaspoon (None)  Oral - Pudding Cup (None)  Oral - Honey Teaspoon (None)  Oral - Honey Cup (None)  Oral - Honey Syringe (None)  Oral - Nectar Teaspoon (None)  Oral - Nectar Cup (None)  Oral - Nectar Straw (None)  Oral - Nectar Syringe (None)  Oral - Ice Chips (None)  Oral - Thin Teaspoon (None)  Oral - Thin Cup (None)  Oral - Thin Straw (None)  Oral - Thin Syringe (None)  Oral - Puree (None)  Oral - Mechanical Soft Impaired mastication  Oral - Regular (None)  Oral - Multi-consistency (None)  Oral - Pill NT  Oral Phase - Comment (None)      CHL IP PHARYNGEAL PHASE 10/29/2014  Pharyngeal Phase Impaired  Pharyngeal - Pudding Teaspoon (None)  Penetration/Aspiration details (pudding teaspoon) (None)  Pharyngeal - Pudding Cup (None)  Penetration/Aspiration details (pudding cup) (None)  Pharyngeal - Honey Teaspoon (None)  Penetration/Aspiration details (honey teaspoon) (None)  Pharyngeal - Honey Cup (None)  Penetration/Aspiration details (honey cup) (None)  Pharyngeal - Honey Syringe (None)  Penetration/Aspiration details (honey syringe) (None)  Pharyngeal - Nectar Teaspoon (None)  Penetration/Aspiration details (nectar teaspoon) (None)  Pharyngeal - Nectar Cup Reduced tongue base retraction;Pharyngeal residue - valleculae;Pharyngeal residue - pyriform sinuses  Penetration/Aspiration details (nectar cup) (None)  Pharyngeal - Nectar Straw (None)  Penetration/Aspiration details (nectar straw) (None)  Pharyngeal - Nectar Syringe (None)  Penetration/Aspiration details (nectar syringe) (None)  Pharyngeal - Ice Chips (None)  Penetration/Aspiration details (ice chips) (None)  Pharyngeal - Thin Teaspoon (None)  Penetration/Aspiration details (thin teaspoon) (None)  Pharyngeal - Thin Cup Reduced tongue base retraction;Pharyngeal residue - valleculae;Pharyngeal residue - pyriform sinuses  Penetration/Aspiration details (thin cup) (None)  Pharyngeal - Thin Straw (None)   Penetration/Aspiration details (thin straw) (None)  Pharyngeal - Thin Syringe (None)  Penetration/Aspiration details (thin syringe') (None)  Pharyngeal - Puree Reduced tongue base retraction;Pharyngeal residue - valleculae;Pharyngeal residue - pyriform sinuses  Penetration/Aspiration details (puree) (None)  Pharyngeal - Mechanical Soft (None)  Penetration/Aspiration details (mechanical soft) (None)  Pharyngeal - Regular (None)  Penetration/Aspiration details (regular) (None)  Pharyngeal - Multi-consistency (None)  Penetration/Aspiration details (multi-consistency) (None)  Pharyngeal - Pill (None)  Penetration/Aspiration details (pill) (None)  Pharyngeal Comment (None)     CHL IP CERVICAL ESOPHAGEAL PHASE 10/29/2014  Cervical Esophageal Phase WFL  Pudding Teaspoon (None)  Pudding Cup (None)  Honey Teaspoon (None)  Honey Cup (None)  Honey Syringe (None)  Nectar Teaspoon (None)  Nectar Cup (None)  Nectar Straw (None)  Nectar Syringe (None)  Thin Teaspoon (None)  Thin Cup (None)  Thin Straw (None)  Thin Syringe (None)  Cervical Esophageal Comment (None)    CHL IP GO 10/29/2014  Functional Assessment Tool Used Clinical judgement  Functional Limitations Swallowing  Swallow Current Status (B3578) CJ  Swallow Goal Status (X7847) CJ  Swallow Discharge Status (Q4128) CJ  Motor Speech Current Status (S0813) (None)  Motor Speech Goal Status (G8719) (None)  Motor Speech Goal Status (L9747) (None)  Spoken Language  Comprehension Current Status [phone]) (None)  Spoken Language Comprehension Goal Status [phone]) (None)  Spoken Language Comprehension Discharge Status [phone]) (None)  Spoken Language Expression Current Status [phone]) (None)  Spoken Language Expression Goal Status [phone] (None)  Spoken Language Expression Discharge Status [phone]) (None)  Attention Current Status (R4163) (None)  Attention Goal Status (A4536) (None)  Attention Discharge Status [phone]) (None)  Memory  Current Status (O1224) (None)  Memory Goal Status (M2500) (None)  Memory Discharge Status (B7048) (None)  Voice Current Status (G8916) (None)  Voice Goal Status (X4503) (None)  Voice Discharge Status (U8828) (None)  Other Speech-Language Pathology Functional Limitation [phone]) (None)  Other Speech-Language Pathology Functional Limitation Goal Status (Z7915) (None)  Other Speech-Language Pathology Functional Limitation Discharge Status [phone]) (None)           Maryjo Rochester T 10/29/2014, 1:40 PM

## 2014-10-30 ENCOUNTER — Telehealth: Payer: Self-pay | Admitting: Internal Medicine

## 2014-10-30 NOTE — Progress Notes (Signed)
Quick Note:  LMTCB ______ 

## 2014-10-30 NOTE — Telephone Encounter (Signed)
Notes Recorded by Nyoka Cowden, MD on 10/29/2014 at 5:39 PM Call patient : Study reviewed, let pt know I'm aware of results and we need to fax copy to her neurologist as well ---------------------------------------------------  Spoke with pt, states she does not have a neurologist. Pt also states that [name] is working well, states she feels her swallowing is improved while on it.    Nothing further needed, just fyi.

## 2014-10-31 ENCOUNTER — Telehealth: Payer: Self-pay | Admitting: Pulmonary Disease

## 2014-10-31 NOTE — Telephone Encounter (Signed)
Per MW- ONO on RA was normal  LMTCB for the pt

## 2014-10-31 NOTE — Telephone Encounter (Signed)
Spoke with pt and notified of results per Dr. Wert. Pt verbalized understanding and denied any questions. 

## 2014-12-06 ENCOUNTER — Ambulatory Visit (INDEPENDENT_AMBULATORY_CARE_PROVIDER_SITE_OTHER): Payer: Commercial Indemnity | Admitting: Internal Medicine

## 2014-12-06 ENCOUNTER — Encounter: Payer: Self-pay | Admitting: Internal Medicine

## 2014-12-06 VITALS — BP 96/78 | HR 73 | Ht 65.5 in | Wt 109.8 lb

## 2014-12-06 DIAGNOSIS — R0600 Dyspnea, unspecified: Secondary | ICD-10-CM | POA: Diagnosis not present

## 2014-12-06 DIAGNOSIS — R1314 Dysphagia, pharyngoesophageal phase: Secondary | ICD-10-CM

## 2014-12-06 NOTE — Progress Notes (Signed)
Subjective:    Patient ID: [name], female    DOB: Nov 11, 1960   MRN: [id number]    Brief patient profile:  53 yowf quit smoking 1989 then ? MS dx'd early 90's and "it's been steadily downhill since" mostly muscle related but since 2012 also with breathing difficulty > admit:  Admit date: 06/06/2012  Discharge date: 06/20/2012  Discharge Diagnoses:  Acute respiratory failure  CAP (community acquired pneumonia) vs aspiration pneumonia  Acute encephalopathy, resolved  Neuromuscular disorder: not fully defined.  Anemia  Thrombocytopenia  Coagulopathy  Acute liver failure  Enterococcal UTI (pansensitive)  Shock circulatory, suspect primarily cardiogenic  Sacral Pressure Ulcer  Dilated cardiomyopathy  Malnutrition compromising bodily function  H/O anorexia nervosa  Dysphagia    History of Present Illness  08/17/2013 1st [facility] office visit/ [name] was able do some adl/s with walker and no 02 summer of 2014  then downhill since  And now sob at rest  X 2 weeks and able to lie down at 45 degrees and can't tol lying flat at all for sev weeks assoc with dry cough day and night and dysphagia/ choking sensation on acei.  Already on gerd rx/ inhaler no benefit.  rec Stop lisinopril Avapro 75 mg one daily  Prilosec 20 mg Take 30- 60 min before your first and last meals of the day  GERD diet     09/15/2013 f/u ov/Sharlyn Odonnel re: acei vs copd  Chief Complaint  Patient presents with  . Follow-up    Pt states that her SOB/cough has improved greatly.  Pt still c/o SOB but is much better since lv.    limited more by fatigue and weakness than sob, swallowing fine now  rec spiriva 2puffs each am to see if your activity tolerance improves> improved so maint rx    09/05/2014 acute ov/Eliz Nigg re: worse sob/increase need for saba on spiriva maint rx  Chief Complaint  Patient presents with  . Acute Visit    Pt c/o increased SOB- started mid Nov 2015, worse for the past month. She is  using xopenex 2-3 x per day on average. She c/o cough for the past month- sometimes prod with clear yellow/green sputum. She has had some occ bloody nasal d/c.  Has not been able to lie down for several years, sitting upright in hosp bed due to sob Swallowing worse "because cough is worse" despite  on prilosec 20 bid with meals  Denies choking but all foods are pureed. rec Change omeprazole to where you take 20mg  x 2 Take 30- 60 min before your first and last meals of the day  GERD   Try flutter valve as needed for cough or congestion > ok to cough into the valve if helps   10/25/2014 f/u ov/Akaya Proffit re: mild copd/ MS with chronic dysphagia/ off pred x 21 days prior to OV   Chief Complaint  Patient presents with  . Acute Visit    Pt c/o decreased o2 sats since 09/26/14- feels fatigued and more SOB. She states that she is also aspirating more and got choked drinking water recently.   while on prednisone did not use any xopenex hfa but prior was using it up 3 x daily  On prednisone cough also goes away  But worse in am's without it. Thinks she may be aspirating more since Nov 2015 >   Convinced her copd is progressing based on her readings Mucus is minimal, no food in it rec Please see patient coordinator  before you leave today  to schedule overnight oximetry> neg   modified barium swallow>MBS 10/29/2014  Diet Recommendations Dysphagia 1 (Puree);Thin liquid;Dysphagia 2  (Fine chop)  Liquid Administration via Cup  Medication Administration Crushed with puree  Compensations Slow rate;Small sips/bites;Multiple dry swallows  after each bite/sip  Postural Changes and/or Swallow Maneuvers Seated upright 90  degrees;Chin tuck Add automatically Dulera 100 Take 2 puffs first thing in am and then another 2 puffs about 12 hours later.  Plan B = backup  Only use your levoalbuterol  If needed  Cough into the flutter valve if having trouble getting anything up  Please schedule a follow up office visit in  6 weeks, call sooner if needed with pfts on return    12/06/2014 f/u ov/Arkin Imran re: mild copd but refuses repeat pfts/ MS/ wc bound / pm dulera 100 2bid and spiriva respimat Chief Complaint  Patient presents with  . Follow-up    Pt states her breathing is much improved, but not back to normal baseline. She has used rescue inhaler x 5 since her last visit.   all better swallowing/ breathing with lowest sats 92% but bought her own 02 through company in [place] when did not qualify for it thru insurance   No obvious patterns in day to day or daytime variabilty or assoc  cp or chest tightness, subjective wheeze overt sinus or hb symptoms. No unusual exp hx or h/o childhood pna/ asthma or knowledge of premature birth.  Sleeping ok without nocturnal  or early am exacerbation  of respiratory  c/o's or need for noct saba. Also denies any obvious fluctuation of symptoms with weather or environmental changes or other aggravating or alleviating factors except as outlined above   Current Medications, Allergies, Complete Past Medical History, Past Surgical History, Family History, and Social History were reviewed in [employer] record.  ROS  The following are not active complaints unless bolded sore throat, dysphagia, dental problems, itching, sneezing,  nasal congestion or excess/ purulent secretions, ear ache,   fever, chills, sweats, unintended wt loss, pleuritic or exertional cp, hemoptysis,  orthopnea pnd or leg swelling, presyncope, palpitations, heartburn, abdominal pain, anorexia, nausea, vomiting, diarrhea  or change in bowel or urinary habits, change in stools or urine, dysuria,hematuria,  rash, arthralgias, visual complaints, headache, numbness weakness or ataxia or problems with walking or coordination,  change in mood/affect or memory.             Objective:   Physical Exam  Chronically ill thin w/c bound very anxious wf nad   09/15/2013       113  > 10/27/2013   113 >  05/07/2014  111>  112 09/05/2014  > 10/25/2014  110 > 12/06/2014  110 Wt Readings from Last 3 Encounters:  08/17/13 113 lb (51.256 kg)  07/18/13 114 lb 6.4 oz (51.891 kg)  06/21/13 112 lb (50.803 kg)      HEENT: nl dentition, turbinates, and orophanx. Nl external ear canals without cough reflex   NECK :  without JVD/Nodes/TM/ nl carotid upstrokes bilaterally   LUNGS: no acc muscle use, clear to A and P bilaterally without cough on insp or exp maneuvers -   CV:  RRR  no s3 or murmur or increase in P2, no edema   ABD:  soft and nontender with nl excursion in the supine position. No bruits or organomegaly, bowel sounds nl  MS:  warm without deformities, calf tenderness, cyanosis or clubbing  I personally reviewed images and agree with radiology impression as follows:  CXR: 09/05/14 Hyperinflation consistent with history of COPD. No acute abnormalities.     Assessment & Plan:

## 2014-12-06 NOTE — Patient Instructions (Addendum)
Goal is to keep the saturation over 90%   No change in medications  Please schedule a follow up visit in 3 months but call sooner if needed

## 2014-12-08 ENCOUNTER — Encounter: Payer: Self-pay | Admitting: Internal Medicine

## 2014-12-08 NOTE — Assessment & Plan Note (Addendum)
-   sp smoking cessation in 1989 - see f/v loops 08/01/13 c/w upper airway obstruction  - trial off acei 08/18/2013 > marked improvement - 09/15/2013   Walked RA x about 50 ft and stopped due to  Fatigue s sob or desat - Spiriva trial 09/15/2013 > improved 10/27/13 so continue - added flutter valve 09/05/2014  - 10/25/2014    75% > added dulera 100 2bid based on reported improvement with prednisone - ono RA 10/25/2014 > neg for desats   - MBS 10/25/2014 > see dysphagia  The proper method of use, as well as anticipated side effects, of a metered-dose inhaler are discussed and demonstrated to the patient. Improved effectiveness after extensive coaching during this visit to a level of approximately     75% and has trouble with hand strength limiting   I had an extended discussion with the patient and husband  reviewing all relevant studies completed to date and  lasting 15 to 20 minutes of a 25 minute visit on the following ongoing concerns:    She is clearly better though not clear whether that is due to better swallowing or better control of airways with dulera but probably doesn't need 02 or spiriva at this point; however, pt very reluctant to consider any changes at this point and is willing to pay out of pocket if needed to stay on present rx so no changes were rec today  Each maintenance medication was reviewed in detail including most importantly the difference between maintenance and as needed and under what circumstances the prns are to be used.  Please see instructions for details which were reviewed in writing and the patient given a copy.

## 2014-12-08 NOTE — Assessment & Plan Note (Signed)
Reviewed findngs on st eval with pt from 10/29/14 and it is critical given her MS that she follow these as much as possible and repeat the evaluation for any deterioration in the future as I am convinced this is the most important aspect of her care

## 2014-12-14 ENCOUNTER — Encounter: Payer: Self-pay | Admitting: Internal Medicine

## 2015-03-07 ENCOUNTER — Encounter: Payer: Self-pay | Admitting: Internal Medicine

## 2015-03-07 ENCOUNTER — Ambulatory Visit (INDEPENDENT_AMBULATORY_CARE_PROVIDER_SITE_OTHER): Payer: Commercial Indemnity | Admitting: Internal Medicine

## 2015-03-07 DIAGNOSIS — R0600 Dyspnea, unspecified: Secondary | ICD-10-CM

## 2015-03-07 MED ORDER — TIOTROPIUM BROMIDE MONOHYDRATE 2.5 MCG/ACT IN AERS: INHALATION_SPRAY | RESPIRATORY_TRACT | Status: DC

## 2015-03-07 MED ORDER — PREDNISONE 10 MG PO TABS: ORAL_TABLET | ORAL | Status: DC

## 2015-03-07 MED ORDER — MOMETASONE FURO-FORMOTEROL FUM 100-5 MCG/ACT IN AERO: INHALATION_SPRAY | RESPIRATORY_TRACT | Status: DC

## 2015-03-07 MED ORDER — LEVALBUTEROL TARTRATE 45 MCG/ACT IN AERO: INHALATION_SPRAY | RESPIRATORY_TRACT | Status: DC

## 2015-03-07 NOTE — Patient Instructions (Signed)
Prednisone 10 mg take  4 each am x 2 days,   2 each am x 2 days,  1 each am x 2 days and stop    Work on inhaler technique:  relax and gently blow all the way out then take a nice smooth deep breath back in, triggering the inhaler at same time you start breathing in.  Hold for up to 5 seconds if you can. Blow out thru nose. Rinse and gargle with water when done      Please schedule a follow up visit in 3 months but call sooner if needed     

## 2015-03-07 NOTE — Progress Notes (Signed)
Subjective:    Patient ID: [name], female    DOB: 10-Feb-1961   MRN: [id number]    Brief patient profile:  54 yowf quit smoking 1989 then ? MS dx'd early 90's and "it's been steadily downhill since" mostly muscle related but since 2012 also with breathing difficulty > admit:  Admit date: 06/06/2012  Discharge date: 06/20/2012  Discharge Diagnoses:  Acute respiratory failure  CAP (community acquired pneumonia) vs aspiration pneumonia  Acute encephalopathy, resolved  Neuromuscular disorder: not fully defined.  Anemia  Thrombocytopenia  Coagulopathy  Acute liver failure  Enterococcal UTI (pansensitive)  Shock circulatory, suspect primarily cardiogenic  Sacral Pressure Ulcer  Dilated cardiomyopathy  Malnutrition compromising bodily function  H/O anorexia nervosa  Dysphagia    History of Present Illness  08/17/2013 [name] visit/ [name] was able do some adl/s with walker and no 02 summer of 2014  then downhill since  And now sob at rest  X 2 weeks and able to lie down at 45 degrees and can't tol lying flat at all for sev weeks assoc with dry cough day and night and dysphagia/ choking sensation on acei.  Already on gerd rx/ inhaler no benefit.  rec Stop lisinopril Avapro 75 mg one daily  Prilosec 20 mg Take 30- 60 min before your first and last meals of the day  GERD diet     09/15/2013 f/u ov/[name] re: acei vs copd  Chief Complaint  Patient presents with  . Follow-up    Pt states that her SOB/cough has improved greatly.  Pt still c/o SOB but is much better since lv.    limited more by fatigue and weakness than sob, swallowing fine now  rec spiriva 2puffs each am to see if your activity tolerance improves> improved so maint rx    09/05/2014 acute ov/[name] re: worse sob/increase need for saba on spiriva maint rx  Chief Complaint  Patient presents with  . Acute Visit    Pt c/o increased SOB- started mid Nov 2015, worse for the past month. She is  using xopenex 2-3 x per day on average. She c/o cough for the past month- sometimes prod with clear yellow/green sputum. She has had some occ bloody nasal d/c.  Has not been able to lie down for several years, sitting upright in hosp bed due to sob Swallowing worse "because cough is worse" despite  on prilosec 20 bid with meals  Denies choking but all foods are pureed. rec Change omeprazole to where you take 20mg  x 2 Take 30- 60 min before your first and last meals of the day  GERD   Try flutter valve as needed for cough or congestion > ok to cough into the valve if helps   10/25/2014 f/u ov/[name] re: mild copd/ MS with chronic dysphagia/ off pred x 21 days prior to OV   Chief Complaint  Patient presents with  . Acute Visit    Pt c/o decreased o2 sats since 09/26/14- feels fatigued and more SOB. She states that she is also aspirating more and got choked drinking water recently.   while on prednisone did not use any xopenex hfa but prior was using it up 3 x daily  On prednisone cough also goes away  But worse in am's without it. Thinks she may be aspirating more since Nov 2015 >   Convinced her copd is progressing based on her readings Mucus is minimal, no food in it rec Please see patient coordinator  before you leave today  to schedule overnight oximetry> neg   modified barium swallow>MBS 10/29/2014  Diet Recommendations Dysphagia 1 (Puree);Thin liquid;Dysphagia 2  (Fine chop)  Liquid Administration via Cup  Medication Administration Crushed with puree  Compensations Slow rate;Small sips/bites;Multiple dry swallows  after each bite/sip  Postural Changes and/or Swallow Maneuvers Seated upright 90  degrees;Chin tuck Add automatically Dulera 100 Take 2 puffs first thing in am and then another 2 puffs about 12 hours later.  Plan B = backup  Only use your levoalbuterol  If needed  Cough into the flutter valve if having trouble getting anything up  Please schedule a follow up office visit in  6 weeks, call sooner if needed with pfts on return    12/06/2014 f/u ov/[name] re: mild copd but refuses repeat pfts/ MS/ wc bound / pm dulera 100 2bid and spiriva respimat Chief Complaint  Patient presents with  . Follow-up    Pt states her breathing is much improved, but not back to normal baseline. She has used rescue inhaler x 5 since her last visit.   all better swallowing/ breathing with lowest sats 92% but bought her own 78 through company in [place] when did not qualify for it thru insurance  rec Goal is to keep the saturation over 90%  No change in medications   03/07/2015 f/u ov/[name] re: mild copd with atypical ab features/ MS on spiriva/dulera 100 2bid  Chief Complaint  Patient presents with  . Follow-up    Pt states [name] has helped alot. She has been having increased SOB and cough for the past 5 days b/c she was exposed to people smoking around her. She is coughing up clear sputum and using xopenex daily.     Has 02 for prn but hasn't been using it at all but using much more xopenex than baseline since smoke exp more for cough than sob/subjective wheeze.  No obvious patterns in day to day or daytime variabilty or assoc  cp or chest tightness, subjective wheeze overt sinus or hb symptoms. No unusual exp hx or h/o childhood pna/ asthma or knowledge of premature birth.  Sleeping ok without nocturnal  or early am exacerbation  of respiratory  c/o's or need for noct saba. Also denies any obvious fluctuation of symptoms with weather or environmental changes or other aggravating or alleviating factors except as outlined above   Current Medications, Allergies, Complete Past Medical History, Past Surgical History, Family History, and Social History were reviewed in [employer] record.  ROS  The following are not active complaints unless bolded sore throat, dysphagia, dental problems, itching, sneezing,  nasal congestion or excess/ purulent secretions, ear ache,   fever,  chills, sweats, unintended wt loss, pleuritic or exertional cp, hemoptysis,  orthopnea pnd or leg swelling, presyncope, palpitations, heartburn, abdominal pain, anorexia, nausea, vomiting, diarrhea  or change in bowel or urinary habits, change in stools or urine, dysuria,hematuria,  rash, arthralgias, visual complaints, headache, numbness weakness or ataxia or problems with walking or coordination,  change in mood/affect or memory.             Objective:   Physical Exam  Chronically ill thin w/c bound very anxious wf nad talking a mile a minute / mostly pseudowheeze on exam   09/15/2013       113  > 10/27/2013   113 > 05/07/2014  111>  112 09/05/2014  > 10/25/2014  110 > 12/06/2014  110 > 03/07/2015  110 Wt Readings  from Last 3 Encounters:  08/17/13 113 lb (51.256 kg)  07/18/13 114 lb 6.4 oz (51.891 kg)  06/21/13 112 lb (50.803 kg)      HEENT: nl dentition, turbinates, and orophanx. Nl external ear canals without cough reflex   NECK :  without JVD/Nodes/TM/ nl carotid upstrokes bilaterally   LUNGS: no acc muscle use, clear to A and P bilaterally without cough on insp or exp maneuvers -   CV:  RRR  no s3 or murmur or increase in P2, no edema   ABD:  soft and nontender with nl excursion in the supine position. No bruits or organomegaly, bowel sounds nl  MS:  warm without deformities, calf tenderness, cyanosis or clubbing      I personally reviewed images and agree with radiology impression as follows:  CXR: 09/05/14 Hyperinflation consistent with history of COPD. No acute abnormalities.     Assessment & Plan:

## 2015-03-10 ENCOUNTER — Encounter: Payer: Self-pay | Admitting: Internal Medicine

## 2015-03-10 NOTE — Assessment & Plan Note (Signed)
-   sp smoking cessation in 1989 - see f/v loops 08/01/13 c/w upper airway obstruction > refused to repeat off acei  - trial off acei 08/18/2013 > marked improvement - 09/15/2013   Walked RA x about 50 ft and stopped due to  Fatigue s sob or desat - Spiriva trial 09/15/2013 > improved 10/27/13 so continue - added flutter valve 09/05/2014  - 10/25/2014  added dulera 100 2bid based on reported improvement with prednisone> much better  - ono RA 10/25/2014 > neg for desats   - MBS 10/25/2014 > see dysphagia - 03/07/2015  p extensive coaching HFA effectiveness =    75% and has trouble with hand strength limiting    My overall feeling is that the amount of air flow obstruction she has is minimal but has a maximal impact because of the effects of her underlying multiple sclerosis and chronic dysphagia. She is convinced that the cigarette smoke that she was exposed is  Causing an exacerbation and although most of her symptoms are upper airway nature, I did recommend a short course of prednisone.  I had an extended discussion with the patient reviewing all relevant studies completed to date and  lasting 15 to 20 minutes of a 25 minute visit    Each maintenance medication was reviewed in detail including most importantly the difference between maintenance and prns and under what circumstances the prns are to be triggered using an action plan format that is not reflected in the computer generated alphabetically organized AVS.    Please see instructions for details which were reviewed in writing and the patient given a copy highlighting the part that I personally wrote and discussed at today's ov.

## 2015-03-15 ENCOUNTER — Encounter: Payer: Self-pay | Admitting: Cardiology

## 2015-03-15 ENCOUNTER — Ambulatory Visit (INDEPENDENT_AMBULATORY_CARE_PROVIDER_SITE_OTHER): Payer: Commercial Indemnity | Admitting: Cardiology

## 2015-03-15 VITALS — BP 98/50 | HR 76 | Ht 65.5 in | Wt 110.4 lb

## 2015-03-15 DIAGNOSIS — R0600 Dyspnea, unspecified: Secondary | ICD-10-CM

## 2015-03-15 DIAGNOSIS — I5022 Chronic systolic (congestive) heart failure: Secondary | ICD-10-CM | POA: Diagnosis not present

## 2015-03-15 DIAGNOSIS — R002 Palpitations: Secondary | ICD-10-CM

## 2015-03-15 LAB — BASIC METABOLIC PANEL
BUN: 20 mg/dL (ref 6–23)
CO2: 31 mEq/L (ref 19–32)
Calcium: 9.8 mg/dL (ref 8.4–10.5)
Chloride: 99 mEq/L (ref 96–112)
Creatinine, Ser: 0.52 mg/dL (ref 0.40–1.20)
GFR: 131.59 mL/min (ref >60.00)
Glucose, Bld: 113 mg/dL — ABNORMAL HIGH (ref 70–99)
Potassium: 3.6 mEq/L (ref 3.5–5.1)
Sodium: 138 mEq/L (ref 135–145)

## 2015-03-15 LAB — BRAIN NATRIURETIC PEPTIDE: Pro B Natriuretic peptide (BNP): 14 pg/mL (ref 0.0–100.0)

## 2015-03-15 NOTE — Patient Instructions (Signed)
Medication Instructions:  No changes today   Labwork: BMET/BNP today  Testing/Procedures: None today  Follow-Up: Your physician wants you to follow-up in: 6 months with Dr McLean. (March 2017)  You will receive a reminder letter in the mail two months in advance. If you don't receive a letter, please call our office to schedule the follow-up appointment.     

## 2015-03-17 NOTE — Progress Notes (Signed)
Patient ID: [name], female   DOB: 08/22/1960, 54 y.o.   MRN: [id number] PCP: Dr. Corliss Blacker  54 yo with complicated past medical history presents for evaluation of her cardiomyopathy.  She has a long history of neuromuscular disorder.  The exact diagnosis remains undetermined.  She is confined to a wheelchair.  In 12/13, she was admitted to [facility] with hypotension, hypothermia, and altered mental status.   She was found to have profound anemia with hemoglobin down to 3.  She was transfused 4 units.  She also was found to have Enterococcal UTI and Serratia PNA.  She was intubated and eventually required tracheostomy.  During her hospitalization, she was found to have a cardiomyopathy with EF 20-25% by echo.  She was treated with milrinone.  Repeat echo in 5/14 showed EF improved to 50-55% with abnormal septal motion and last echo in 9/14 showed EF 55-60%.    Previously lasix was increased to 40 mg in am and 20 mg in pm along with 20 meq of potassium. I offered her a RHC at that time however she declined. PFTs obtained as noted below and showed severe airway obstruction with subsequent evaluation by Dr [name].  At that time Dr [name] switched lisinopril to irbesartan. He felt that despite severity of her COPD, main limitation was her neuromuscular condition.  Event monitor was worn in 1/15 due to tachypalpitations.  This showed PACs.   She has been stable recently. No chest pain.  She has been sleeping in a chair for 3 years.  No PND.  She has required courses of steroids for COPD/asthma exacerbations.  She feels like [name] is helping.  She uses oxygen prn at home. Weight is down 3 lbs.   ECG: NSR, normal  Labs (1/14): K 3.9, creatinine 0.21, HCT 30 Labs (4/14): K 4, creatinine 0.4, BNP 19 Labs (10/14): K 3.5, creatinine 0.5 Labs (12/14): K 3.6, creatinine 0.4 Labs (07/28/12): K 3.5 creatinine 0.5 Labs (08/01/13): K 3.5 Creatinine 0.5 Pro BNP 129 Labs (2/16): K 3.8, creatinine 0.44  PMH: 1.  Anorexia/cachexia/failure to thrive: Patient had a PEG tube.  2. Neuromuscular disease: exact diagnosis is still undetermined despite extensive neurology evaluation.  She does not have multiple sclerosis.  3. Fibromyalgia 4. IBS 5. TMJ syndrome 6. PTSD 7. GERD 8. Sacral decubitus ulcer 9. H/o aspiration 10. 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  Echo (12/13) with EF 20-25% with diffuse hypokinesis, mild MR, moderate RV dilation and moderate RV dysfunction.  Echo (5/14) with EF 50-55%, abnormal septal motion.  Echo (9/14) with EF 55-60%.   11. Anemia 12. COPD: PFTs 08/01/13 with FEV1 0.98 liters (33%), FVC 1.87 (50%), FEV 1/FVC 52%, FEF 25-75% 0.65 liter.  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  Quit smoking > 20 years ago.  No ETOH.     FH: No history of cardiomyopathy.  Grandfather had MI at 40.   ROS: All systems reviewed and negative except as per HPI.    Current Outpatient Prescriptions  Medication Sig Dispense Refill  . bisoprolol (ZEBETA) 5 MG tablet Take 1 tablet (5 mg total) by mouth daily. 30 tablet 11  . cholecalciferol (VITAMIN D) 1000 UNITS tablet Take 2,000 Units by mouth daily.     . furosemide (LASIX) 20 MG tablet Take 2 tablets by mouth in the am, take 1 tablet pm    . irbesartan (AVAPRO) 75  MG tablet Take 1 tablet (75 mg total) by mouth daily. 30 tablet 11  . levalbuterol (XOPENEX HFA) 45 MCG/ACT inhaler Inhale up to 2 puffs every 4 hours as needed for shortness of breath or wheezing    . MAGNESIUM PO Take 1 tablet by mouth daily.    . mometasone-formoterol (DULERA) 100-5 MCG/ACT AERO Inhale 2 puffs into the lungs first thing in am and then another 2 puffs about 12 hours later.    . Multiple Vitamins-Minerals (CEROVITE ADVANCED FORMULA) LIQD Take 15 mLs by mouth daily.    Marland Kitchen omeprazole (PRILOSEC) 40 MG capsule Take 40 mg by mouth 2  (two) times daily.    . potassium chloride (KLOR-CON 10) 10 MEQ tablet Take 2 tablets (20 mEq total) by mouth daily. 60 tablet 11  . PRESCRIPTION MEDICATION O2 2L daily as needed    . Respiratory Therapy Supplies (FLUTTER) DEVI Use as directed 1 each 0  . Tiotropium Bromide Monohydrate (SPIRIVA RESPIMAT) 2.5 MCG/ACT AERS Inhale 2 puffs into the lungs every morning.    . Zinc 30 MG CAPS Take 1 capsule by mouth daily.      No current facility-administered medications for this visit.    BP 98/50 mmHg  Pulse 76  Ht 5' 5.5" (1.664 m)  Wt 110 lb 6.4 oz (50.077 kg)  BMI 18.09 kg/m2 General: NAD, thin.  Neck: No JVD, no thyromegaly or thyroid nodule.  Lungs: Clear to auscultation bilaterally with normal respiratory effort. Decreased BS throughout.  CV: Nondisplaced PMI.  Heart regular S1/S2, no S3/S4, no murmur.  No ankle edema.  No carotid bruit.  Normal pedal pulses.  Abdomen: Soft, nontender, no hepatosplenomegaly, mildly distended.  Neurologic: Alert and oriented x 3.  Psych: Normal affect. Extremities: No clubbing or cyanosis.   Assessment/Plan: 1. 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  She will continue bisoprolol, irbesartan, and Lasix.  She did not tolerate coming off irbesartan in past.  Check BMET/BNP today.  2. Dyspnea: Improved. Doubt cardiac in nature. Suspect combination of COPD/asthma and deconditioning due to neuromuscular disorder.  She is followed closely by Dr [name].  3. Neuromuscular disorder: Not fully characterized.  She is in a wheelchair most of the time but can walk with a walker. I offered to refer her back to neurology in the past but she declined.  4. Palpitations: PACs on event monitor.  These are improved.   Followup 6 months.    [name] Consuegra,MD 03/17/2015

## 2015-05-13 ENCOUNTER — Other Ambulatory Visit: Payer: Self-pay | Admitting: Family Medicine

## 2015-05-13 DIAGNOSIS — N644 Mastodynia: Secondary | ICD-10-CM

## 2015-05-13 DIAGNOSIS — N6489 Other specified disorders of breast: Secondary | ICD-10-CM

## 2015-05-17 ENCOUNTER — Other Ambulatory Visit: Payer: Commercial Indemnity

## 2015-05-24 ENCOUNTER — Other Ambulatory Visit: Payer: Commercial Indemnity

## 2015-06-04 ENCOUNTER — Ambulatory Visit (INDEPENDENT_AMBULATORY_CARE_PROVIDER_SITE_OTHER): Payer: Commercial Indemnity | Admitting: Internal Medicine

## 2015-06-04 ENCOUNTER — Ambulatory Visit: Payer: Commercial Indemnity | Admitting: Internal Medicine

## 2015-06-04 ENCOUNTER — Ambulatory Visit (INDEPENDENT_AMBULATORY_CARE_PROVIDER_SITE_OTHER)
Admission: RE | Admit: 2015-06-04 | Discharge: 2015-06-04 | Disposition: A | Discharge status: Home / Self Care | Payer: Commercial Indemnity | Source: Ambulatory Visit | Attending: Internal Medicine | Admitting: Internal Medicine

## 2015-06-04 ENCOUNTER — Encounter: Payer: Self-pay | Admitting: Internal Medicine

## 2015-06-04 DIAGNOSIS — R0600 Dyspnea, unspecified: Secondary | ICD-10-CM

## 2015-06-04 MED ORDER — MOMETASONE FURO-FORMOTEROL FUM 200-5 MCG/ACT IN AERO: INHALATION_SPRAY | RESPIRATORY_TRACT | Status: DC

## 2015-06-04 MED ORDER — PREDNISONE 10 MG PO TABS
ORAL_TABLET | ORAL | Status: DC
Start: 2015-06-04 — End: 2015-08-19

## 2015-06-04 NOTE — Patient Instructions (Addendum)
Increase dulera to 200 Take 2 puffs first thing in am and then another 2 puffs about 12 hours later.   02 2lpm at bedtime and during the day goal is  to keep above 90% so ok to adjust up down or off to achieve that goal  Prednisone 10 mg take  4 each am x 2 days,   2 each am x 2 days,  1 each am x 2 days and stop   Please schedule a follow up visit in 3 months but call sooner if needed

## 2015-06-04 NOTE — Progress Notes (Signed)
Subjective:    Patient ID: [name], female    DOB: [birth year]   MRN: [id number]    Brief patient profile:  54 yowf quit smoking 1989 then ? MS dx'd early 46's by [facility] (though pt states flatly she doesn't have MS)  and "it's been steadily downhill since" mostly muscle related but since 2012 also with breathing difficulty > admit:  Admit date: 06/06/2012  Discharge date: 06/20/2012  Discharge Diagnoses:  Acute respiratory failure  CAP (community acquired pneumonia) vs aspiration pneumonia  Acute encephalopathy, resolved  Neuromuscular disorder: not fully defined.  Anemia  Thrombocytopenia  Coagulopathy  Acute liver failure  Enterococcal UTI (pansensitive)  Shock circulatory, suspect primarily cardiogenic  Sacral Pressure Ulcer  Dilated cardiomyopathy  Malnutrition compromising bodily function  H/O anorexia nervosa  Dysphagia    History of Present Illness  08/17/2013 1st [facility] office visit/ [name] was able do some adl/s with walker and no 02 summer of 2014  then downhill since  And now sob at rest  X 2 weeks and able to lie down at 45 degrees and can't tol lying flat at all for sev weeks assoc with dry cough day and night and dysphagia/ choking sensation on acei.  Already on gerd rx/ inhaler no benefit.  rec Stop lisinopril Avapro 75 mg one daily  Prilosec 20 mg Take 30- 60 min before your first and last meals of the day  GERD diet     09/15/2013 f/u ov/[name] re: acei vs copd  Chief Complaint  Patient presents with  . Follow-up    Pt states that her SOB/cough has improved greatly.  Pt still c/o SOB but is much better since lv.    limited more by fatigue and weakness than sob, swallowing fine now  rec spiriva 2puffs each am to see if your activity tolerance improves> improved so maint rx    09/05/2014 acute ov/[name] re: worse sob/increase need for saba on spiriva maint rx  Chief Complaint  Patient presents with  . Acute Visit    Pt c/o increased  SOB- started mid Nov 2015, worse for the past month. She is using xopenex 2-3 x per day on average. She c/o cough for the past month- sometimes prod with clear yellow/green sputum. She has had some occ bloody nasal d/c.  Has not been able to lie down for several years, sitting upright in hosp bed due to sob Swallowing worse "because cough is worse" despite  on prilosec 20 bid with meals  Denies choking but all foods are pureed. rec Change omeprazole to where you take 20mg  x 2 Take 30- 60 min before your first and last meals of the day  GERD   Try flutter valve as needed for cough or congestion > ok to cough into the valve if helps    10/25/2014 f/u ov/[name] re: mild copd/ MS with chronic dysphagia/ off pred x 21 days prior to OV   Chief Complaint  Patient presents with  . Acute Visit    Pt c/o decreased o2 sats since 09/26/14- feels fatigued and more SOB. She states that she is also aspirating more and got choked drinking water recently.   while on prednisone did not use any xopenex hfa but prior was using it up 3 x daily  On prednisone cough also goes away  But worse in am's without it. Thinks she may be aspirating more since Nov 2015 >   Convinced her copd is progressing based on her readings  Mucus is minimal, no food in it rec Please see patient coordinator before you leave today  to schedule overnight oximetry> neg   modified barium swallow>MBS 10/29/2014  Diet Recommendations Dysphagia 1 (Puree);Thin liquid;Dysphagia 2  (Fine chop)  Liquid Administration via Cup  Medication Administration Crushed with puree  Compensations Slow rate;Small sips/bites;Multiple dry swallows  after each bite/sip  Postural Changes and/or Swallow Maneuvers Seated upright 90  degrees;Chin tuck Add automatically Dulera 100 Take 2 puffs first thing in am and then another 2 puffs about 12 hours later.  Plan B = backup  Only use your levoalbuterol  If needed  Cough into the flutter valve if having trouble  getting anything up  Please schedule a follow up office visit in 6 weeks, call sooner if needed with pfts on return    12/06/2014 f/u ov/[name] re: mild copd but refuses repeat pfts/ MS/ wc bound / pm dulera 100 2bid and spiriva respimat Chief Complaint  Patient presents with  . Follow-up    Pt states her breathing is much improved, but not back to normal baseline. She has used rescue inhaler x 5 since her last visit.   all better swallowing/ breathing with lowest sats 92% but bought her own 107 through company in [place] when did not qualify for it thru insurance  rec Goal is to keep the saturation over 90%  No change in medications   03/07/2015 f/u ov/[name] re: mild copd with atypical ab features/ MS on spiriva/dulera 100 2bid  Chief Complaint  Patient presents with  . Follow-up    Pt states [name] has helped alot. She has been having increased SOB and cough for the past 5 days b/c she was exposed to people smoking around her. She is coughing up clear sputum and using xopenex daily.   Has 02 for prn but hasn't been using it at all but using much more xopenex than baseline since smoke exp more for cough than sob/subjective wheeze. rec Prednisone 10 mg take  4 each am x 2 days,   2 each am x 2 days,  1 each am x 2 days and stop     06/04/2015  f/u ov/[name] re:  Probable copd on spiriva and dulera 100 2bid Chief Complaint  Patient presents with  . Follow-up    Pt c/o increased SOB for the 3 wks. She states that she coughs more when she talks and produces clear sputum.  She has been using her o2 more, and xopenex 1-2 x per wk.   Uses 02 daytime prn  Never uses at hs  Not seeing neuro any more / "the only reason my muscles get worse is that my breathing gets worse" swallowing no change. Prednisone really helps breathing    No obvious patterns in day to day or daytime variabilty or assoc  cp or chest tightness, subjective wheeze overt sinus or hb symptoms. No unusual exp hx or h/o childhood pna/  asthma or knowledge of premature birth.  Sleeping ok at 45 degrees  without nocturnal  or early am exacerbation  of respiratory  c/o's or need for noct saba. Also denies any obvious fluctuation of symptoms with weather or environmental changes or other aggravating or alleviating factors except as outlined above   Current Medications, Allergies, Complete Past Medical History, Past Surgical History, Family History, and Social History were reviewed in [employer] record.  ROS  The following are not active complaints unless bolded sore throat, dysphagia, dental problems, itching, sneezing,  nasal congestion or excess/ purulent secretions, ear ache,   fever, chills, sweats, unintended wt loss, pleuritic or exertional cp, hemoptysis,  orthopnea pnd or leg swelling, presyncope, palpitations, heartburn, abdominal pain, anorexia, nausea, vomiting, diarrhea  or change in bowel or urinary habits, change in stools or urine, dysuria,hematuria,  rash, arthralgias, visual complaints, headache, numbness weakness or ataxia or problems with walking or coordination,  change in mood/affect or memory.             Objective:   Physical Exam  Chronically ill thin w/c bound very anxious wf  09/15/2013       113  > 10/27/2013   113 > 05/07/2014  111>  112 09/05/2014  > 10/25/2014  110 > 12/06/2014  110 > 03/07/2015  110> 06/04/2015  109     08/17/13 113 lb (51.256 kg)  07/18/13 114 lb 6.4 oz (51.891 kg)  06/21/13 112 lb (50.803 kg)      HEENT: nl dentition, turbinates, and orophanx. Nl external ear canals without cough reflex   NECK :  without JVD/Nodes/TM/ nl carotid upstrokes bilaterally   LUNGS: no acc muscle use, clear to A and P bilaterally without cough on insp or exp maneuvers -   CV:  RRR  no s3 or murmur or increase in P2, no edema   ABD:  soft and nontender with nl excursion in the supine position. No bruits or organomegaly, bowel sounds nl  MS:  warm without deformities, calf  tenderness, cyanosis or clubbing      CXR PA and Lateral:   06/04/2015 :    I personally reviewed images and agree with radiology impression as follows:   Hyperinflation suggests emphysema, given the clinical history of COPD. No acute findings.         Assessment & Plan:

## 2015-06-05 ENCOUNTER — Encounter: Payer: Self-pay | Admitting: Internal Medicine

## 2015-06-05 NOTE — Assessment & Plan Note (Addendum)
-   sp smoking cessation in 1989 - see f/v loops 08/01/13 c/w upper airway obstruction > refused to repeat off acei  - trial off acei 08/18/2013 > marked improvement - 09/15/2013   Walked RA x about 50 ft and stopped due to  Fatigue s sob or desat - Spiriva trial 09/15/2013 > improved 10/27/13 so continue - added flutter valve 09/05/2014  - 10/25/2014  added dulera 100 2bid based on reported improvement with prednisone> much better  - ono RA 10/25/2014 > neg for desats   - MBS 10/25/2014 > see dysphagia - 03/07/2015  p extensive coaching HFA effectiveness =    75% and has trouble with hand strength limiting    I had an extended discussion with the patient/husband reviewing all relevant studies completed to date and  lasting 25 minutes of a 40 minute extended  visit    1) very little evidence here of limiting airflow obst and she refuses to repeat pfts to establish severity off acei which initially likely caused lots of abnormalities in the f/v loop in the effort dep portion  2) I've never seen lung dz cause muscle weakness or wasting or difficulty swallowing until it was in the very final / terminal stages which this is not  3) her acute deterioration requiring vent/ trach developed over a year and was most notable for profound malnutrition suggesting a component of a psychological disorder in the absence of a firm neuro dx and I strongly rec she see neuro again / see PT and start working toward rehab instead of focusing on "her lungs holding her back" which is not the case at all in my opinion.   4) for now agreed to rx with pred x 6 days only and try increase dulera to 200 2bid  5) continue dietary restrictions as aspiration is still a major and ongoing concern.     Each maintenance medication was reviewed in detail including most importantly the difference between maintenance and prns and under what circumstances the prns are to be triggered using an action plan format that is not reflected in the  computer generated alphabetically organized AVS.    Please see instructions for details which were reviewed in writing and the patient given a copy highlighting the part that I personally wrote and discussed at today's ov.

## 2015-06-05 NOTE — Progress Notes (Signed)
Quick Note:  Contacted pt with results per MW Pt expressed understanding, nothing further needed ______ 

## 2015-07-12 ENCOUNTER — Telehealth: Payer: Self-pay | Admitting: Internal Medicine

## 2015-07-12 MED ORDER — PREDNISONE 10 MG PO TABS: ORAL_TABLET | ORAL | Status: DC

## 2015-07-12 NOTE — Telephone Encounter (Signed)
Spoke with pt. She is requesting a prednisone taper be sent in. Reports that over the holiday her granddaughter wore perfume around her and she has developed a cough. Cough is producing clear mucus. Wheezing is also present. Denies chest tightness, SOB or fever.  MW - please advise. Thanks.

## 2015-07-12 NOTE — Telephone Encounter (Signed)
Prednisone 10 mg take  4 each am x 2 days,   2 each am x 2 days,  1 each am x 2 days and stop  

## 2015-07-12 NOTE — Telephone Encounter (Signed)
Patient notified of Dr. Wert's recommendations. Rx sent to pharmacy. Nothing further needed.  

## 2015-08-19 ENCOUNTER — Ambulatory Visit (INDEPENDENT_AMBULATORY_CARE_PROVIDER_SITE_OTHER): Payer: Managed Care, Other (non HMO) | Admitting: Internal Medicine

## 2015-08-19 ENCOUNTER — Encounter: Payer: Self-pay | Admitting: Internal Medicine

## 2015-08-19 VITALS — BP 92/60 | HR 87 | Temp 98.0°F | Ht 65.5 in | Wt 105.0 lb

## 2015-08-19 DIAGNOSIS — R05 Cough: Secondary | ICD-10-CM | POA: Diagnosis not present

## 2015-08-19 DIAGNOSIS — R0600 Dyspnea, unspecified: Secondary | ICD-10-CM | POA: Diagnosis not present

## 2015-08-19 DIAGNOSIS — R058 Other specified cough: Secondary | ICD-10-CM | POA: Insufficient documentation

## 2015-08-19 MED ORDER — METHYLPREDNISOLONE ACETATE 80 MG/ML IJ SUSP
120.0000 mg | Freq: Once | INTRAMUSCULAR | Status: AC
  Administered 2015-08-19: 120 mg via INTRAMUSCULAR

## 2015-08-19 MED ORDER — PREDNISONE 10 MG PO TABS: ORAL_TABLET | ORAL | Status: DC

## 2015-08-19 NOTE — Patient Instructions (Addendum)
For flares of cough wheeze/short of breath :  First try xopenex hfa up to 2 puffs every 4 hours as needed and if this doesn't correct the problem ok to use prednisone x 6 days   Change appointment to 3 months - call sooner if needed

## 2015-08-19 NOTE — Progress Notes (Signed)
Subjective:    Patient ID: [name], female    DOB: [birth year]   MRN: [id number]    Brief patient profile:  54 yowf quit smoking 1989 then ? MS dx'd early 48's by [facility] (though pt states flatly she doesn't have MS)  and "it's been steadily downhill since" mostly muscle related but since 2012 also with breathing difficulty > admit:  Admit date: 06/06/2012  Discharge date: 06/20/2012  Discharge Diagnoses:  Acute respiratory failure  CAP (community acquired pneumonia) vs aspiration pneumonia  Acute encephalopathy, resolved  Neuromuscular disorder: not fully defined.  Anemia  Thrombocytopenia  Coagulopathy  Acute liver failure  Enterococcal UTI (pansensitive)  Shock circulatory, suspect primarily cardiogenic  Sacral Pressure Ulcer  Dilated cardiomyopathy  Malnutrition compromising bodily function  H/O anorexia nervosa  Dysphagia    History of Present Illness  08/17/2013 1st [facility] office visit/ [name] was able do some adl/s with walker and no 02 summer of 2014  then downhill since  And now sob at rest  X 2 weeks and able to lie down at 45 degrees and can't tol lying flat at all for sev weeks assoc with dry cough day and night and dysphagia/ choking sensation on acei.  Already on gerd rx/ inhaler no benefit.  rec Stop lisinopril Avapro 75 mg one daily  Prilosec 20 mg Take 30- 60 min before your first and last meals of the day  GERD diet     09/15/2013 f/u ov/[name] re: acei vs copd  Chief Complaint  Patient presents with  . Follow-up    Pt states that her SOB/cough has improved greatly.  Pt still c/o SOB but is much better since lv.    limited more by fatigue and weakness than sob, swallowing fine now  rec spiriva 2puffs each am to see if your activity tolerance improves> improved so maint rx    09/05/2014 acute ov/[name] re: worse sob/increase need for saba on spiriva maint rx  Chief Complaint  Patient presents with  . Acute Visit    Pt c/o increased  SOB- started mid Nov 2015, worse for the past month. She is using xopenex 2-3 x per day on average. She c/o cough for the past month- sometimes prod with clear yellow/green sputum. She has had some occ bloody nasal d/c.  Has not been able to lie down for several years, sitting upright in hosp bed due to sob Swallowing worse "because cough is worse" despite  on prilosec 20 bid with meals  Denies choking but all foods are pureed. rec Change omeprazole to where you take 20mg  x 2 Take 30- 60 min before your first and last meals of the day  GERD   Try flutter valve as needed for cough or congestion > ok to cough into the valve if helps    10/25/2014 f/u ov/[name] re: mild copd/ MS with chronic dysphagia/ off pred x 21 days prior to OV   Chief Complaint  Patient presents with  . Acute Visit    Pt c/o decreased o2 sats since 09/26/14- feels fatigued and more SOB. She states that she is also aspirating more and got choked drinking water recently.   while on prednisone did not use any xopenex hfa but prior was using it up 3 x daily  On prednisone cough also goes away  But worse in am's without it. Thinks she may be aspirating more since Nov 2015 >   Convinced her copd is progressing based on her readings  Mucus is minimal, no food in it rec Please see patient coordinator before you leave today  to schedule overnight oximetry> neg   modified barium swallow>MBS 10/29/2014  Diet Recommendations Dysphagia 1 (Puree);Thin liquid;Dysphagia 2  (Fine chop)  Liquid Administration via Cup  Medication Administration Crushed with puree  Compensations Slow rate;Small sips/bites;Multiple dry swallows  after each bite/sip  Postural Changes and/or Swallow Maneuvers Seated upright 90  degrees;Chin tuck Add automatically Dulera 100 Take 2 puffs first thing in am and then another 2 puffs about 12 hours later.  Plan B = backup  Only use your levoalbuterol  If needed  Cough into the flutter valve if having trouble  getting anything up  Please schedule a follow up office visit in 6 weeks, call sooner if needed with pfts on return    12/06/2014 f/u ov/[name] re: mild copd but refuses repeat pfts/ MS/ wc bound / pm dulera 100 2bid and spiriva respimat Chief Complaint  Patient presents with  . Follow-up    Pt states her breathing is much improved, but not back to normal baseline. She has used rescue inhaler x 5 since her last visit.   all better swallowing/ breathing with lowest sats 92% but bought her own 35 through company in [place] when did not qualify for it thru insurance  rec Goal is to keep the saturation over 90%  No change in medications   03/07/2015 f/u ov/[name] re: mild copd with atypical ab features/ MS on spiriva/dulera 100 2bid  Chief Complaint  Patient presents with  . Follow-up    Pt states [name] has helped alot. She has been having increased SOB and cough for the past 5 days b/c she was exposed to people smoking around her. She is coughing up clear sputum and using xopenex daily.   Has 02 for prn but hasn't been using it at all but using much more xopenex than baseline since smoke exp more for cough than sob/subjective wheeze. rec Prednisone 10 mg take  4 each am x 2 days,   2 each am x 2 days,  1 each am x 2 days and stop     06/04/2015  f/u ov/[name] re:  Probable copd on spiriva and dulera 100 2bid Chief Complaint  Patient presents with  . Follow-up    Pt c/o increased SOB for the 3 wks. She states that she coughs more when she talks and produces clear sputum.  She has been using her o2 more, and xopenex 1-2 x per wk.   Uses 02 daytime prn  Never uses at hs  Not seeing neuro any more / "the only reason my muscles get worse is that my breathing gets worse" swallowing no change. Prednisone really helps breathing  rec Increase dulera to 200 Take 2 puffs first thing in am and then another 2 puffs about 12 hours later.  02 2lpm at bedtime and during the day goal is  to keep above 90% so ok  to adjust up down or off to achieve that goal Prednisone 10 mg take  4 each am x 2 days,   2 each am x 2 days,  1 each am x 2 days and stop   07/12/15 requested pred rx p perfume exp    08/19/2015 acute extended ov/[name] re: cough / main rx dulera 200/ spiriva / no xop Chief Complaint  Patient presents with  . Acute Visit    Pt c/o increased SOB, cough and "lung pain" for the past 3  days. She states "I got into some dust or smells or something".  Her cough is prod with clear sputum.   did fine p last pred rx then acutely worse p ? Dust exp x 3 days    No obvious patterns in day to day or daytime variabilty or assoc  cp or chest tightness, subjective wheeze overt sinus or hb symptoms. No unusual exp hx or h/o childhood pna/ asthma or knowledge of premature birth.  Sleeping ok at 45 degrees  without nocturnal  or early am exacerbation  of respiratory  c/o's or need for noct saba. Also denies any obvious fluctuation of symptoms with weather or environmental changes or other aggravating or alleviating factors except as outlined above   Current Medications, Allergies, Complete Past Medical History, Past Surgical History, Family History, and Social History were reviewed in [employer] record.  ROS  The following are not active complaints unless bolded sore throat, dysphagia, dental problems, itching, sneezing,  nasal congestion or excess/ purulent secretions, ear ache,   fever, chills, sweats, unintended wt loss, pleuritic or exertional cp, hemoptysis,  orthopnea pnd or leg swelling, presyncope, palpitations, heartburn, abdominal pain, anorexia, nausea, vomiting, diarrhea  or change in bowel or urinary habits, change in stools or urine, dysuria,hematuria,  rash, arthralgias, visual complaints, headache, numbness weakness or ataxia or problems with walking or coordination,  change in mood/affect or memory.             Objective:   Physical Exam  Chronically ill thin w/c  bound very anxious wf  09/15/2013       113  > 10/27/2013   113 > 05/07/2014  111>  112 09/05/2014  > 10/25/2014  110 > 12/06/2014  110 > 03/07/2015  110> 06/04/2015  109 > 08/19/2015  105       08/17/13 113 lb (51.256 kg)  07/18/13 114 lb 6.4 oz (51.891 kg)  06/21/13 112 lb (50.803 kg)      HEENT: nl dentition, turbinates, and orophanx. Nl external ear canals without cough reflex   NECK :  without JVD/Nodes/TM/ nl carotid upstrokes bilaterally   LUNGS: no acc muscle use, classic psuedowheeze/ insp and exp    CV:  RRR  no s3 or murmur or increase in P2, no edema   ABD:  soft and nontender with nl excursion in the supine position. No bruits or organomegaly, bowel sounds nl  MS:  warm without deformities, calf tenderness, cyanosis or clubbing      CXR PA and Lateral:   06/04/2015 :    I personally reviewed images and agree with radiology impression as follows:   Hyperinflation suggests emphysema, given the clinical history of COPD. No acute findings.         Assessment & Plan:

## 2015-08-22 ENCOUNTER — Encounter: Payer: Self-pay | Admitting: Internal Medicine

## 2015-08-22 NOTE — Assessment & Plan Note (Addendum)
-   sp smoking cessation in 1989 - see f/v loops 08/01/13 c/w upper airway obstruction > refused to repeat off acei  - trial off acei 08/18/2013 > marked improvement - 09/15/2013   Walked RA x about 50 ft and stopped due to  Fatigue s sob or desat - Spiriva trial 09/15/2013 > improved 10/27/13 so continue - added flutter valve 09/05/2014  - 10/25/2014  added dulera 100 2bid based on reported improvement with prednisone> much better  - ono RA 10/25/2014 > neg for desats   - MBS 10/25/2014 > see dysphagia - 08/19/2015  p extensive coaching HFA effectiveness =    75% and has trouble with hand strength limiting   Continuing dulera 200/ spiriva empirically at this point though not clear at all they are helping - see uacs (see separate a/p)   Again explained why 02 not approved in her setting but if she wants to purchase a POC on her own that's fine with me

## 2015-08-22 NOTE — Assessment & Plan Note (Addendum)
ACEi d/c 08/18/13 >> marked improvement  MBS 10/29/2014  >> Diet Recommendations Dysphagia 1 (Puree);Thin liquid;Dysphagia 2  - Flare with perfume exp 07/2015 - Flare with dust exp - see ov 08/19/2015   Exam is much more c/w VCD exac than aecopd or AB with anxiety contributing significantly though not clear why prednisone helps and refusing to do MCT so sort out so we're stuck with emprical rx at this point   I had an extended discussion with the patient reviewing all relevant studies completed to date and  lasting 25 minutes of a40 minute actue extended office  visit    Each maintenance medication was reviewed in detail including most importantly the difference between maintenance and prns and under what circumstances the prns are to be triggered using an action plan format that is not reflected in the computer generated alphabetically organized AVS.    Please see instructions for details which were reviewed in writing and the patient given a copy highlighting the part that I personally wrote and discussed at today's ov.

## 2015-08-29 ENCOUNTER — Other Ambulatory Visit: Payer: Self-pay | Admitting: *Deleted

## 2015-08-29 DIAGNOSIS — I5022 Chronic systolic (congestive) heart failure: Secondary | ICD-10-CM

## 2015-08-29 DIAGNOSIS — R1900 Intra-abdominal and pelvic swelling, mass and lump, unspecified site: Secondary | ICD-10-CM

## 2015-08-29 DIAGNOSIS — R0602 Shortness of breath: Secondary | ICD-10-CM

## 2015-08-29 MED ORDER — FUROSEMIDE 20 MG PO TABS: 20.0000 mg | ORAL_TABLET | Freq: Every day | ORAL | Status: DC

## 2015-08-29 MED ORDER — IRBESARTAN 75 MG PO TABS: 75.0000 mg | ORAL_TABLET | Freq: Every day | ORAL | Status: DC

## 2015-08-29 MED ORDER — POTASSIUM CHLORIDE ER 10 MEQ PO TBCR: 20.0000 meq | EXTENDED_RELEASE_TABLET | Freq: Every day | ORAL | Status: DC

## 2015-08-29 MED ORDER — BISOPROLOL FUMARATE 5 MG PO TABS: 5.0000 mg | ORAL_TABLET | Freq: Every day | ORAL | Status: DC

## 2015-09-03 ENCOUNTER — Ambulatory Visit: Payer: Commercial Indemnity | Admitting: Internal Medicine

## 2015-11-11 ENCOUNTER — Encounter: Payer: Self-pay | Admitting: Cardiology

## 2015-11-11 ENCOUNTER — Encounter: Payer: Self-pay | Admitting: *Deleted

## 2015-11-11 ENCOUNTER — Ambulatory Visit (INDEPENDENT_AMBULATORY_CARE_PROVIDER_SITE_OTHER): Payer: Managed Care, Other (non HMO) | Admitting: Cardiology

## 2015-11-11 VITALS — BP 88/54 | HR 75 | Ht 65.5 in | Wt 101.0 lb

## 2015-11-11 DIAGNOSIS — G709 Myoneural disorder, unspecified: Secondary | ICD-10-CM | POA: Diagnosis not present

## 2015-11-11 DIAGNOSIS — R0602 Shortness of breath: Secondary | ICD-10-CM

## 2015-11-11 DIAGNOSIS — I5022 Chronic systolic (congestive) heart failure: Secondary | ICD-10-CM | POA: Diagnosis not present

## 2015-11-11 NOTE — Progress Notes (Signed)
Patient ID: [name], female   DOB: [birth year], 55 y.o.   MRN: [id number] PCP: Dr. Corliss Blacker  55 yo with complicated past medical history presents for evaluation of her cardiomyopathy.  She has a long history of neuromuscular disorder.  The exact diagnosis remains undetermined.  She is confined to a wheelchair.  In 12/13, she was admitted to [facility] with hypotension, hypothermia, and altered mental status.   She was found to have profound anemia with hemoglobin down to 3.  She was transfused 4 units.  She also was found to have Enterococcal UTI and Serratia PNA.  She was intubated and eventually required tracheostomy.  During her hospitalization, she was found to have a cardiomyopathy with EF 20-25% by echo.  She was treated with milrinone.  Repeat echo in 5/14 showed EF improved to 50-55% with abnormal septal motion and last echo in 9/14 showed EF 55-60%.    Previously lasix was increased to 40 mg in am and 20 mg in pm along with 20 meq of potassium. I offered her a RHC however she declined. PFTs obtained as noted below and showed severe airway obstruction with subsequent evaluation by Dr [name].  At that time Dr [name] switched lisinopril to irbesartan. He felt that despite severity of her COPD, main limitation was her neuromuscular condition. Event monitor was worn in 1/15 due to tachypalpitations.  This showed PACs.   No chest pain.  She has been sleeping in a chair for 3 years.  No PND.  She has required courses of steroids for COPD/asthma exacerbations.  She uses oxygen at night primarily.  She is trying to do more around the house but notes dyspnea with laundary and dish-washing.  She walks with a walker in her house.  She says that her oxygen saturation drops to the 80s at times during exertion (she has a pulse ox at home).  Weight is down 9 lbs.   ECG: NSR, iRBBB  Labs (1/14): K 3.9, creatinine 0.21, HCT 30 Labs (4/14): K 4, creatinine 0.4, BNP 19 Labs (10/14): K 3.5, creatinine 0.5 Labs  (12/14): K 3.6, creatinine 0.4 Labs (07/28/12): K 3.5 creatinine 0.5 Labs (08/01/13): K 3.5 Creatinine 0.5 Pro BNP 129 Labs (2/16): K 3.8, creatinine 0.44 Labs (9/16): K 3.6, creatinine 0.52, BNP 14  PMH: 1. Anorexia/cachexia/failure to thrive: Patient had a PEG tube.  2. Neuromuscular disease: exact diagnosis is still undetermined despite extensive neurology evaluation.  She does not have multiple sclerosis.  3. Fibromyalgia 4. IBS 5. TMJ syndrome 6. PTSD 7. GERD 8. Sacral decubitus ulcer 9. H/o aspiration 10. 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  Echo (12/13) with EF 20-25% with diffuse hypokinesis, mild MR, moderate RV dilation and moderate RV dysfunction.  Echo (5/14) with EF 50-55%, abnormal septal motion.  Echo (9/14) with EF 55-60%.   11. Anemia 12. COPD: PFTs 08/01/13 with FEV1 0.98 liters (33%), FVC 1.87 (50%), FEV 1/FVC 52%, FEF 25-75% 0.65 liter.  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  Quit smoking > 20 years ago.  No ETOH.     FH: No history of cardiomyopathy.  Grandfather had MI at 81.   ROS: All systems reviewed and negative except as per HPI.    Current Outpatient Prescriptions  Medication Sig Dispense Refill  . bisoprolol (ZEBETA) 5 MG tablet Take 1 tablet (5 mg total) by mouth daily. 30 tablet 11  .  cholecalciferol (VITAMIN D) 1000 UNITS tablet Take 2,000 Units by mouth daily.     . furosemide (LASIX) 20 MG tablet Take 1 tablet (20 mg total) by mouth daily. Take 2 tablets by mouth in the am, take 1 tablet pm 90 tablet 11  . irbesartan (AVAPRO) 75 MG tablet Take 1 tablet (75 mg total) by mouth daily. 30 tablet 11  . levalbuterol (XOPENEX HFA) 45 MCG/ACT inhaler Inhale up to 2 puffs every 4 hours as needed for shortness of breath or wheezing    . MAGNESIUM PO Take 1 tablet by mouth daily.    . mometasone-formoterol (DULERA) 200-5  MCG/ACT AERO Take 2 puffs first thing in am and then another 2 puffs about 12 hours later. 1 Inhaler 11  . omeprazole (PRILOSEC) 40 MG capsule Take 40 mg by mouth 2 (two) times daily.    . OXYGEN 2lpm with sleep and prn daytime    . potassium chloride (KLOR-CON 10) 10 MEQ tablet Take 2 tablets (20 mEq total) by mouth daily. 60 tablet 11  . predniSONE (DELTASONE) 10 MG tablet Take  4 each am x 2 days,   2 each am x 2 days,  1 each am x 2 days and stop 14 tablet 11  . Respiratory Therapy Supplies (FLUTTER) DEVI Use as directed 1 each 0  . Tiotropium Bromide Monohydrate (SPIRIVA RESPIMAT) 2.5 MCG/ACT AERS Inhale 2 puffs into the lungs every morning.    Marland Kitchen UNABLE TO FIND Med Name: Centrum Multivitamin Liquid 15 ml daily    . Zinc 30 MG CAPS Take 1 capsule by mouth daily.      No current facility-administered medications for this visit.    BP 88/54 mmHg  Pulse 75  Ht 5' 5.5" (1.664 m)  Wt 101 lb (45.813 kg)  BMI 16.55 kg/m2 General: NAD, thin.  Neck: No JVD, no thyromegaly or thyroid nodule.  Lungs: Clear to auscultation bilaterally with normal respiratory effort. Decreased BS throughout.  CV: Nondisplaced PMI.  Heart regular S1/S2, no S3/S4, no murmur.  No ankle edema.  No carotid bruit.  Normal pedal pulses.  Abdomen: Soft, nontender, no hepatosplenomegaly, nondistended.  Neurologic: Alert and oriented x 3.  Psych: Normal affect. Extremities: No clubbing or cyanosis.   Assessment/Plan: 1. Cardiomyopathy: She is not volume overloaded on exam, do not think that dyspnea is due to CHF.  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  She will continue bisoprolol, irbesartan, and Lasix.  She did not tolerate coming off irbesartan in past.  Check BMET/BNP today.  I am going to arrange for repeat echo to make sure that EF remains in normal range.  2. Dyspnea: NYHA class III symptoms. Suspect combination of COPD/asthma and  deconditioning due to neuromuscular disorder.  As above, doubt CHF.  She is followed closely by Dr [name].  She is on oxygen at night and sounds like she needs oxygen with exertion as well.  3. Neuromuscular disorder: Not fully characterized.  She is in a wheelchair most of the time but can walk with a walker. I offered to refer her back to neurology in the past but she declined.  4. Palpitations: PACs on event monitor.  These are improved.   Followup 6 months.    Olamae Ferrara,MD 11/11/2015

## 2015-11-11 NOTE — Patient Instructions (Signed)
Medication Instructions:  Your physician recommends that you continue on your current medications as directed. Please refer to the Current Medication list given to you today.   Labwork: BMET/BNP today  Testing/Procedures: Your physician has requested that you have an echocardiogram. Echocardiography is a painless test that uses sound waves to create images of your heart. It provides your doctor with information about the size and shape of your heart and how well your heart's chambers and valves are working. This procedure takes approximately one hour. There are no restrictions for this procedure.    Follow-Up: Your physician wants you to follow-up in: 6 months with Dr Shirlee Latch. (November 2017). You will receive a reminder letter in the mail two months in advance. If you don't receive a letter, please call our office to schedule the follow-up appointment.   Any Other Special Instructions Will Be Listed Below (If Applicable).  Use your oxygen when you exert yourself during the day.      If you need a refill on your cardiac medications before your next appointment, please call your pharmacy.

## 2015-11-12 LAB — BASIC METABOLIC PANEL
BUN: 20 mg/dL (ref 7–25)
CO2: 25 mmol/L (ref 20–31)
Calcium: 9.5 mg/dL (ref 8.6–10.4)
Chloride: 103 mmol/L (ref 98–110)
Creat: 0.48 mg/dL — ABNORMAL LOW (ref 0.50–1.05)
Glucose, Bld: 109 mg/dL — ABNORMAL HIGH (ref 65–99)
Potassium: 3.8 mmol/L (ref 3.5–5.3)
Sodium: 140 mmol/L (ref 135–146)

## 2015-11-12 LAB — BRAIN NATRIURETIC PEPTIDE: Brain Natriuretic Peptide: 10.5 pg/mL (ref <100)

## 2015-11-18 ENCOUNTER — Ambulatory Visit (INDEPENDENT_AMBULATORY_CARE_PROVIDER_SITE_OTHER): Payer: Managed Care, Other (non HMO) | Admitting: Internal Medicine

## 2015-11-18 ENCOUNTER — Encounter: Payer: Self-pay | Admitting: Internal Medicine

## 2015-11-18 VITALS — BP 92/60 | HR 75 | Ht 65.5 in | Wt 102.0 lb

## 2015-11-18 DIAGNOSIS — R0600 Dyspnea, unspecified: Secondary | ICD-10-CM | POA: Diagnosis not present

## 2015-11-18 MED ORDER — LEVALBUTEROL TARTRATE 45 MCG/ACT IN AERO: INHALATION_SPRAY | RESPIRATORY_TRACT | Status: DC

## 2015-11-18 MED ORDER — TIOTROPIUM BROMIDE MONOHYDRATE 2.5 MCG/ACT IN AERS: 2.0000 | INHALATION_SPRAY | RESPIRATORY_TRACT | Status: DC

## 2015-11-18 MED ORDER — MOMETASONE FURO-FORMOTEROL FUM 200-5 MCG/ACT IN AERO: INHALATION_SPRAY | RESPIRATORY_TRACT | Status: DC

## 2015-11-18 NOTE — Patient Instructions (Signed)
No change in medications or contingency plans - you are good for refills x 6 months minimum   Please schedule a follow up visit in  6  months but call sooner if needed

## 2015-11-18 NOTE — Progress Notes (Signed)
Subjective:    Patient ID: [name], female    DOB: [birth year]   MRN: [id number]    Brief patient profile:  54 yowf quit smoking 1989 then ? MS dx'd early 46's by Dohmeir (though pt states flatly she doesn't have MS)  and "it's been steadily downhill since" mostly muscle related but since 2012 also with breathing difficulty > admit:  Admit date: 06/06/2012  Discharge date: 06/20/2012  Discharge Diagnoses:  Acute respiratory failure  CAP (community acquired pneumonia) vs aspiration pneumonia  Acute encephalopathy, resolved  Neuromuscular disorder: not fully defined.  Anemia  Thrombocytopenia  Coagulopathy  Acute liver failure  Enterococcal UTI (pansensitive)  Shock circulatory, suspect primarily cardiogenic  Sacral Pressure Ulcer  Dilated cardiomyopathy  Malnutrition compromising bodily function  H/O anorexia nervosa  Dysphagia    History of Present Illness  08/17/2013 1st [facility] office visit/ [name] was able do some adl/s with walker and no 02 summer of 2014  then downhill since  And now sob at rest  X 2 weeks and able to lie down at 45 degrees and can't tol lying flat at all for sev weeks assoc with dry cough day and night and dysphagia/ choking sensation on acei.  Already on gerd rx/ inhaler no benefit.  rec Stop lisinopril Avapro 75 mg one daily  Prilosec 20 mg Take 30- 60 min before your first and last meals of the day  GERD diet     09/15/2013 f/u ov/[name] re: acei vs copd  Chief Complaint  Patient presents with  . Follow-up    Pt states that her SOB/cough has improved greatly.  Pt still c/o SOB but is much better since lv.    limited more by fatigue and weakness than sob, swallowing fine now  rec spiriva 2puffs each am to see if your activity tolerance improves> improved so maint rx    09/05/2014 acute ov/[name] re: worse sob/increase need for saba on spiriva maint rx  Chief Complaint  Patient presents with  . Acute Visit    Pt c/o increased  SOB- started mid Nov 2015, worse for the past month. She is using xopenex 2-3 x per day on average. She c/o cough for the past month- sometimes prod with clear yellow/green sputum. She has had some occ bloody nasal d/c.  Has not been able to lie down for several years, sitting upright in hosp bed due to sob Swallowing worse "because cough is worse" despite  on prilosec 20 bid with meals  Denies choking but all foods are pureed. rec Change omeprazole to where you take 20mg  x 2 Take 30- 60 min before your first and last meals of the day  GERD   Try flutter valve as needed for cough or congestion > ok to cough into the valve if helps    10/25/2014 f/u ov/[name] re: mild copd/ MS with chronic dysphagia/ off pred x 21 days prior to OV   Chief Complaint  Patient presents with  . Acute Visit    Pt c/o decreased o2 sats since 09/26/14- feels fatigued and more SOB. She states that she is also aspirating more and got choked drinking water recently.   while on prednisone did not use any xopenex hfa but prior was using it up 3 x daily  On prednisone cough also goes away  But worse in am's without it. Thinks she may be aspirating more since Nov 2015 >   Convinced her copd is progressing based on her readings  Mucus is minimal, no food in it rec Please see patient coordinator before you leave today  to schedule overnight oximetry> neg   modified barium swallow>MBS 10/29/2014  Diet Recommendations Dysphagia 1 (Puree);Thin liquid;Dysphagia 2  (Fine chop)  Liquid Administration via Cup  Medication Administration Crushed with puree  Compensations Slow rate;Small sips/bites;Multiple dry swallows  after each bite/sip  Postural Changes and/or Swallow Maneuvers Seated upright 90  degrees;Chin tuck Add automatically Dulera 100 Take 2 puffs first thing in am and then another 2 puffs about 12 hours later.  Plan B = backup  Only use your levoalbuterol  If needed  Cough into the flutter valve if having trouble  getting anything up  Please schedule a follow up office visit in 6 weeks, call sooner if needed with pfts on return    12/06/2014 f/u ov/[name] re: mild copd but refuses repeat pfts/ MS/ wc bound / pm dulera 100 2bid and spiriva respimat Chief Complaint  Patient presents with  . Follow-up    Pt states her breathing is much improved, but not back to normal baseline. She has used rescue inhaler x 5 since her last visit.   all better swallowing/ breathing with lowest sats 92% but bought her own 35 through company in [place] when did not qualify for it thru insurance  rec Goal is to keep the saturation over 90%  No change in medications   03/07/2015 f/u ov/[name] re: mild copd with atypical ab features/ MS on spiriva/dulera 100 2bid  Chief Complaint  Patient presents with  . Follow-up    Pt states [name] has helped alot. She has been having increased SOB and cough for the past 5 days b/c she was exposed to people smoking around her. She is coughing up clear sputum and using xopenex daily.   Has 02 for prn but hasn't been using it at all but using much more xopenex than baseline since smoke exp more for cough than sob/subjective wheeze. rec Prednisone 10 mg take  4 each am x 2 days,   2 each am x 2 days,  1 each am x 2 days and stop     06/04/2015  f/u ov/[name] re:  Probable copd on spiriva and dulera 100 2bid Chief Complaint  Patient presents with  . Follow-up    Pt c/o increased SOB for the 3 wks. She states that she coughs more when she talks and produces clear sputum.  She has been using her o2 more, and xopenex 1-2 x per wk.   Uses 02 daytime prn  Never uses at hs  Not seeing neuro any more / "the only reason my muscles get worse is that my breathing gets worse" swallowing no change. Prednisone really helps breathing  rec Increase dulera to 200 Take 2 puffs first thing in am and then another 2 puffs about 12 hours later.  02 2lpm at bedtime and during the day goal is  to keep above 90% so ok  to adjust up down or off to achieve that goal Prednisone 10 mg take  4 each am x 2 days,   2 each am x 2 days,  1 each am x 2 days and stop   07/12/15 requested pred rx p perfume exp    08/19/2015 acute extended ov/[name] re: cough / main rx dulera 200/ spiriva / no xop Chief Complaint  Patient presents with  . Acute Visit    Pt c/o increased SOB, cough and "lung pain" for the past 3  days. She states "I got into some dust or smells or something".  Her cough is prod with clear sputum.   did fine p last pred rx then acutely worse p ? Dust exp x 3 days  rec For flares of cough wheeze/short of breath : First try xopenex hfa up to 2 puffs every 4 hours as needed and if this doesn't correct the problem ok to use prednisone x 6 days    11/18/2015  f/u ov/[name] re:  ? Copd/ rx dulera 200 /spiriva  Chief Complaint  Patient presents with  . Follow-up    Breathing has not improved since her last visit.   last pred March 2017  For cough > sob > resolved  Becoming more active at home = walking with walker on 02  02 2lpm at sleeping/ 2lpm talking and walking (purchasing on her own as did not qualify)  Gets choked once ever week or so on D II / thin liquid but overall improved on this diet    No obvious patterns in day to day or daytime variabilty or assoc  cp or chest tightness, subjective wheeze overt sinus or hb symptoms. No unusual exp hx or h/o childhood pna/ asthma or knowledge of premature birth.  Sleeping ok at 45 degrees  without nocturnal  or early am exacerbation  of respiratory  c/o's or need for noct saba. Also denies any obvious fluctuation of symptoms with weather or environmental changes or other aggravating or alleviating factors except as outlined above   Current Medications, Allergies, Complete Past Medical History, Past Surgical History, Family History, and Social History were reviewed in [employer] record.  ROS  The following are not active complaints unless  bolded sore throat, dysphagia, dental problems, itching, sneezing,  nasal congestion or excess/ purulent secretions, ear ache,   fever, chills, sweats, unintended wt loss, pleuritic or exertional cp, hemoptysis,  orthopnea pnd or leg swelling, presyncope, palpitations, heartburn, abdominal pain, anorexia, nausea, vomiting, diarrhea  or change in bowel or urinary habits, change in stools or urine, dysuria,hematuria,  rash, arthralgias, visual complaints, headache, numbness weakness or ataxia or problems with walking or coordination,  change in mood/affect or memory.             Objective:   Physical Exam  Chronically ill thin w/c bound   anxious wf nad  09/15/2013       113  > 10/27/2013   113 > 05/07/2014  111>  112 09/05/2014  > 10/25/2014  110 > 12/06/2014  110 > 03/07/2015  110> 06/04/2015  109 > 08/19/2015  105   > 11/18/2015    102     08/17/13 113 lb (51.256 kg)  07/18/13 114 lb 6.4 oz (51.891 kg)  06/21/13 112 lb (50.803 kg)      HEENT: nl dentition, turbinates, and orophanx. Nl external ear canals without cough reflex   NECK :  without JVD/Nodes/TM/ nl carotid upstrokes bilaterally   LUNGS: no acc muscle use, clear to A and P    CV:  RRR  no s3 or murmur or increase in P2, no edema   ABD:  soft and nontender with nl excursion in the supine position. No bruits or organomegaly, bowel sounds nl  MS:  warm without deformities, calf tenderness, cyanosis or clubbing      CXR PA and Lateral:   06/04/2015 :    I personally reviewed images and agree with radiology impression as follows:   Hyperinflation suggests emphysema,  given the clinical history of COPD. No acute findings.         Assessment & Plan:

## 2015-11-19 NOTE — Assessment & Plan Note (Signed)
-   sp smoking cessation in 1989 - see f/v loops 08/01/13 c/w upper airway obstruction > refused to repeat off acei  - trial off acei 08/18/2013 > marked improvement - 09/15/2013   Walked RA x about 50 ft and stopped due to  Fatigue s sob or desat - Spiriva trial 09/15/2013 > improved 10/27/13 so continue - added flutter valve 09/05/2014  - 10/25/2014  added dulera 100 2bid based on reported improvement with prednisone> much better  - ono RA 10/25/2014 > neg for desats   - MBS 10/25/2014 > see dysphagia - 08/19/2015  p extensive coaching HFA effectiveness =    75% and has trouble with hand strength limiting  - reported improved on 02 11/18/2015 (purchased on her own)  We have not been able to document desat with activity or nocturnally nor has she agreed to repeat pfts so rx is entirely empiric at this point and no need to change  = treating her as an N of 1  I had an extended discussion with the patient and husband reviewing all relevant studies completed to date and  lasting 15 to 20 minutes of a 25 minute visit    Each maintenance medication was reviewed in detail including most importantly the difference between maintenance and prns and under what circumstances the prns are to be triggered using an action plan format that is not reflected in the computer generated alphabetically organized AVS.    Please see instructions for details which were reviewed in writing and the patient given a copy highlighting the part that I personally wrote and discussed at today's ov.

## 2015-11-26 ENCOUNTER — Ambulatory Visit (HOSPITAL_COMMUNITY): Payer: Managed Care, Other (non HMO) | Attending: Cardiology

## 2015-11-26 ENCOUNTER — Other Ambulatory Visit: Payer: Self-pay

## 2015-11-26 DIAGNOSIS — I5022 Chronic systolic (congestive) heart failure: Secondary | ICD-10-CM | POA: Insufficient documentation

## 2015-11-26 DIAGNOSIS — R0602 Shortness of breath: Secondary | ICD-10-CM | POA: Diagnosis present

## 2016-01-09 ENCOUNTER — Telehealth: Payer: Self-pay | Admitting: Cardiology

## 2016-01-09 DIAGNOSIS — I5022 Chronic systolic (congestive) heart failure: Secondary | ICD-10-CM

## 2016-01-09 DIAGNOSIS — R600 Localized edema: Secondary | ICD-10-CM

## 2016-01-09 NOTE — Telephone Encounter (Signed)
New Message  Pt call requesting to speak with RN about Fluid retention in both legs. Please call back to discuss

## 2016-01-09 NOTE — Telephone Encounter (Signed)
Pt states her breathing is better and she has been able to be more active and is up on her feet more than in the past.  Pt states she has gradually had an increased in bilateral LE edema/ tightness from top of calf  to ankle/ and one pouch of swelling on her left inner thigh.  Pt states she has been taking lasix 20mg  (2) in the AM and 20mg  (1) in the PM. Pt is requesting to increase lasix to 20mg  (2) bid. Pt advised to avoid sodium/ keep feet and legs elevated when sitting/ use compression stockings. Pt states she is physically unable use compression stockings, too hard for her to get on.  Pt advised I will forward to Dr Shirlee Latch for review.

## 2016-01-09 NOTE — Telephone Encounter (Signed)
She can increase the Lasix as desired but needs BMET and BNP in 1 week.

## 2016-01-14 MED ORDER — FUROSEMIDE 20 MG PO TABS: ORAL_TABLET | ORAL | Status: DC

## 2016-01-14 NOTE — Telephone Encounter (Signed)
Pt advised, verbalized understanding, pt will increase lasix to 20mg  (2) bid,  BMET/BNP scheduled for 01/21/16

## 2016-01-21 ENCOUNTER — Other Ambulatory Visit (INDEPENDENT_AMBULATORY_CARE_PROVIDER_SITE_OTHER): Payer: Managed Care, Other (non HMO)

## 2016-01-21 DIAGNOSIS — R600 Localized edema: Secondary | ICD-10-CM

## 2016-01-21 DIAGNOSIS — I5022 Chronic systolic (congestive) heart failure: Secondary | ICD-10-CM

## 2016-01-21 LAB — BASIC METABOLIC PANEL
BUN: 20 mg/dL (ref 7–25)
CO2: 30 mmol/L (ref 20–31)
Calcium: 9.3 mg/dL (ref 8.6–10.4)
Chloride: 101 mmol/L (ref 98–110)
Creat: 0.66 mg/dL (ref 0.50–1.05)
Glucose, Bld: 87 mg/dL (ref 65–99)
Potassium: 3.7 mmol/L (ref 3.5–5.3)
Sodium: 141 mmol/L (ref 135–146)

## 2016-01-21 LAB — BRAIN NATRIURETIC PEPTIDE: Brain Natriuretic Peptide: 21.3 pg/mL (ref <100)

## 2016-03-10 ENCOUNTER — Other Ambulatory Visit: Payer: Self-pay | Admitting: *Deleted

## 2016-03-10 DIAGNOSIS — I5022 Chronic systolic (congestive) heart failure: Secondary | ICD-10-CM

## 2016-03-10 DIAGNOSIS — R600 Localized edema: Secondary | ICD-10-CM

## 2016-03-10 MED ORDER — FUROSEMIDE 20 MG PO TABS: ORAL_TABLET | ORAL | 8 refills | Status: DC

## 2016-05-20 ENCOUNTER — Ambulatory Visit: Payer: Managed Care, Other (non HMO) | Admitting: Internal Medicine

## 2016-05-26 ENCOUNTER — Encounter: Payer: Self-pay | Admitting: Internal Medicine

## 2016-05-26 ENCOUNTER — Ambulatory Visit (INDEPENDENT_AMBULATORY_CARE_PROVIDER_SITE_OTHER): Payer: Managed Care, Other (non HMO) | Admitting: Internal Medicine

## 2016-05-26 VITALS — BP 110/64 | HR 84 | Ht 65.5 in | Wt 96.0 lb

## 2016-05-26 DIAGNOSIS — R0609 Other forms of dyspnea: Secondary | ICD-10-CM

## 2016-05-26 DIAGNOSIS — R0600 Dyspnea, unspecified: Secondary | ICD-10-CM

## 2016-05-26 DIAGNOSIS — J449 Chronic obstructive pulmonary disease, unspecified: Secondary | ICD-10-CM

## 2016-05-26 NOTE — Progress Notes (Signed)
Subjective:    Patient ID: [name], female    DOB: July 15, 1960   MRN: [id number]    Brief patient profile:  55 yowf quit smoking 1989 then ? MS dx'd early 49's by Dohmeir (though pt states flatly she doesn't have MS)  and "it's been steadily downhill since" mostly muscle related but since 2012 also with breathing difficulty > admit:  Admit date: 06/06/2012  Discharge date: 06/20/2012  Discharge Diagnoses:  Acute respiratory failure  CAP (community acquired pneumonia) vs aspiration pneumonia  Acute encephalopathy, resolved  Neuromuscular disorder: not fully defined.  Anemia  Thrombocytopenia  Coagulopathy  Acute liver failure  Enterococcal UTI (pansensitive)  Shock circulatory, suspect primarily cardiogenic  Sacral Pressure Ulcer  Dilated cardiomyopathy  Malnutrition compromising bodily function  H/O anorexia nervosa  Dysphagia    History of Present Illness  08/17/2013 1st [facility] office visit/ [name] was able do some adl/s with walker and no 02 summer of 2014  then downhill since  And now sob at rest  X 2 weeks and able to lie down at 45 degrees and can't tol lying flat at all for sev weeks assoc with dry cough day and night and dysphagia/ choking sensation on acei.  Already on gerd rx/ inhaler no benefit.  rec Stop lisinopril Avapro 75 mg one daily  Prilosec 20 mg Take 30- 60 min before your first and last meals of the day  GERD diet     09/15/2013 f/u ov/Sarpy Jon re: acei vs copd  Chief Complaint  Patient presents with  . Follow-up    Pt states that her SOB/cough has improved greatly.  Pt still c/o SOB but is much better since lv.    limited more by fatigue and weakness than sob, swallowing fine now  rec spiriva 2puffs each am to see if your activity tolerance improves> improved so maint rx    09/05/2014 acute ov/Yogi Arther re: worse sob/increase need for saba on spiriva maint rx  Chief Complaint  Patient presents with  . Acute Visit    Pt c/o increased  SOB- started mid Nov 2015, worse for the past month. She is using xopenex 2-3 x per day on average. She c/o cough for the past month- sometimes prod with clear yellow/green sputum. She has had some occ bloody nasal d/c.  Has not been able to lie down for several years, sitting upright in hosp bed due to sob Swallowing worse "because cough is worse" despite  on prilosec 20 bid with meals  Denies choking but all foods are pureed. rec Change omeprazole to where you take 20mg  x 2 Take 30- 60 min before your first and last meals of the day  GERD   Try flutter valve as needed for cough or congestion > ok to cough into the valve if helps    10/25/2014 f/u ov/Hansini Clodfelter re: mild copd/ MS with chronic dysphagia/ off pred x 21 days prior to OV   Chief Complaint  Patient presents with  . Acute Visit    Pt c/o decreased o2 sats since 09/26/14- feels fatigued and more SOB. She states that she is also aspirating more and got choked drinking water recently.   while on prednisone did not use any xopenex hfa but prior was using it up 3 x daily  On prednisone cough also goes away  But worse in am's without it. Thinks she may be aspirating more since Nov 2015 >   Convinced her copd is progressing based on her readings  Mucus is minimal, no food in it rec Please see patient coordinator before you leave today  to schedule overnight oximetry> neg   modified barium swallow>MBS 10/29/2014  Diet Recommendations Dysphagia 1 (Puree);Thin liquid;Dysphagia 2  (Fine chop)  Liquid Administration via Cup  Medication Administration Crushed with puree  Compensations Slow rate;Small sips/bites;Multiple dry swallows  after each bite/sip  Postural Changes and/or Swallow Maneuvers Seated upright 90  degrees;Chin tuck Add automatically Dulera 100 Take 2 puffs first thing in am and then another 2 puffs about 12 hours later.  Plan B = backup  Only use your levoalbuterol  If needed  Cough into the flutter valve if having trouble  getting anything up       06/04/2015  f/u ov/Arlene Genova re:  Probable copd on spiriva and dulera 100 2bid Chief Complaint  Patient presents with  . Follow-up    Pt c/o increased SOB for the 3 wks. She states that she coughs more when she talks and produces clear sputum.  She has been using her o2 more, and xopenex 1-2 x per wk.   Uses 02 daytime prn  Never uses at hs  Not seeing neuro any more / "the only reason my muscles get worse is that my breathing gets worse" swallowing no change. Prednisone really helps breathing  rec Increase dulera to 200 Take 2 puffs first thing in am and then another 2 puffs about 12 hours later.  02 2lpm at bedtime and during the day goal is  to keep above 90% so ok to adjust up down or off to achieve that goal Prednisone 10 mg take  4 each am x 2 days,   2 each am x 2 days,  1 each am x 2 days and stop   07/12/15 requested pred rx p perfume exp     11/18/2015  f/u ov/Fares Ramthun re:  ? Copd/ rx dulera 200 /spiriva  Chief Complaint  Patient presents with  . Follow-up    Breathing has not improved since her last visit.   last pred March 2017  For cough > sob > resolved  Becoming more active at home = walking with walker on 02  02 2lpm at sleeping/ 2lpm talking and walking (purchasing on her own as did not qualify)  Gets choked once ever week or so on D II / thin liquid but overall improved on this diet  rec No change in medications or contingency plans - you are good for refills x 6 months minimum  Please schedule a follow up visit in  6  months but call sooner if needed     05/26/2016  f/u ov/Joie Hipps re: ? Copd / on dulera 200 / spiriva   [facility]/ refusing further studies  Chief Complaint  Patient presents with  . Follow-up    Pt doing well - Using O2 2 liters 24/7. Using meds as directed - Spiriva, Dulera and Xop with Prednisone PRN. Pt states that her quality of life is much greater with this new treatment plan.  using 02 at hs and with walking 2lpm /  walks around house with walker  -  02 seems to really help though we have not documented ex desats here     No obvious patterns in day to day or daytime variabilty or assoc  cp or chest tightness, subjective wheeze overt sinus or hb symptoms. No unusual exp hx or h/o childhood pna/ asthma or knowledge of premature birth.  Sleeping ok at 45 degrees  without  nocturnal  or early am exacerbation  of respiratory  c/o's or need for noct saba. Also denies any obvious fluctuation of symptoms with weather or environmental changes or other aggravating or alleviating factors except as outlined above   Current Medications, Allergies, Complete Past Medical History, Past Surgical History, Family History, and Social History were reviewed in [facility] electronic medical record.  ROS  The following are not active complaints unless bolded sore throat, dysphagia, dental problems, itching, sneezing,  nasal congestion or excess/ purulent secretions, ear ache,   fever, chills, sweats, unintended wt loss, pleuritic or exertional cp, hemoptysis,  orthopnea pnd or leg swelling, presyncope, palpitations, heartburn, abdominal pain, anorexia, nausea, vomiting, diarrhea  or change in bowel or urinary habits, change in stools or urine, dysuria,hematuria,  rash, arthralgias, visual complaints, headache, numbness weakness or ataxia or problems with walking or coordination,  change in mood/affect or memory.             Objective:   Physical Exam  Chronically ill thin w/c bound   anxious wf nad   Vital signs reviewed - Note on arrival 02 sats  98% on 2lpm      09/15/2013       113  > 10/27/2013   113 > 05/07/2014  111>  112 09/05/2014  > 10/25/2014  110 > 12/06/2014  110 > 03/07/2015  110> 06/04/2015  109 > 08/19/2015  105   > 11/18/2015    102 > 05/26/2016   96     08/17/13 113 lb (51.256 kg)  07/18/13 114 lb 6.4 oz (51.891 kg)  06/21/13 112 lb (50.803 kg)      HEENT: nl dentition, turbinates, and orophanx. Nl external ear  canals without cough reflex   NECK :  without JVD/Nodes/TM/ nl carotid upstrokes bilaterally   LUNGS: no acc muscle use, clear to A and P    CV:  RRR  no s3 or murmur or increase in P2, no edema   ABD:  soft and nontender with nl excursion in the supine position. No bruits or organomegaly, bowel sounds nl  MS:  warm without deformities, calf tenderness, cyanosis or clubbing      CXR PA and Lateral:   06/04/2015 :    I personally reviewed images and agree with radiology impression as follows:   Hyperinflation suggests emphysema, given the clinical history of COPD. Marland Kitchen.         Assessment & Plan:

## 2016-05-26 NOTE — Patient Instructions (Signed)
Please schedule a follow up visit in 6 months but call sooner if needed  

## 2016-05-27 ENCOUNTER — Telehealth: Payer: Self-pay | Admitting: Internal Medicine

## 2016-05-27 ENCOUNTER — Ambulatory Visit (INDEPENDENT_AMBULATORY_CARE_PROVIDER_SITE_OTHER): Payer: Managed Care, Other (non HMO) | Admitting: Cardiology

## 2016-05-27 VITALS — BP 104/64 | HR 76 | Ht 65.5 in | Wt 96.0 lb

## 2016-05-27 DIAGNOSIS — R600 Localized edema: Secondary | ICD-10-CM

## 2016-05-27 DIAGNOSIS — R002 Palpitations: Secondary | ICD-10-CM

## 2016-05-27 DIAGNOSIS — I5022 Chronic systolic (congestive) heart failure: Secondary | ICD-10-CM | POA: Diagnosis not present

## 2016-05-27 DIAGNOSIS — R1900 Intra-abdominal and pelvic swelling, mass and lump, unspecified site: Secondary | ICD-10-CM | POA: Diagnosis not present

## 2016-05-27 DIAGNOSIS — R0602 Shortness of breath: Secondary | ICD-10-CM

## 2016-05-27 MED ORDER — MOMETASONE FURO-FORMOTEROL FUM 200-5 MCG/ACT IN AERO: INHALATION_SPRAY | RESPIRATORY_TRACT | 6 refills | Status: DC

## 2016-05-27 MED ORDER — PREDNISONE 10 MG PO TABS: ORAL_TABLET | ORAL | 6 refills | Status: DC

## 2016-05-27 MED ORDER — BISOPROLOL FUMARATE 5 MG PO TABS: 5.0000 mg | ORAL_TABLET | Freq: Every day | ORAL | 11 refills | Status: DC

## 2016-05-27 MED ORDER — LEVALBUTEROL TARTRATE 45 MCG/ACT IN AERO: INHALATION_SPRAY | RESPIRATORY_TRACT | 5 refills | Status: DC

## 2016-05-27 MED ORDER — POTASSIUM CHLORIDE ER 10 MEQ PO TBCR: 20.0000 meq | EXTENDED_RELEASE_TABLET | Freq: Every day | ORAL | 11 refills | Status: DC

## 2016-05-27 MED ORDER — FUROSEMIDE 20 MG PO TABS: ORAL_TABLET | ORAL | 11 refills | Status: DC

## 2016-05-27 MED ORDER — IRBESARTAN 75 MG PO TABS: 75.0000 mg | ORAL_TABLET | Freq: Every day | ORAL | 11 refills | Status: DC

## 2016-05-27 MED ORDER — TIOTROPIUM BROMIDE MONOHYDRATE 2.5 MCG/ACT IN AERS: 2.0000 | INHALATION_SPRAY | RESPIRATORY_TRACT | 6 refills | Status: DC

## 2016-05-27 NOTE — Telephone Encounter (Signed)
MW  Please advise  You saw this pt. Yesterday and she stated she was suppose to get refills on her Gaye Alken, Xopenex, and Prednisone.   Pt. instructions were:  Please schedule a follow up visit in 6 months but call sooner if needed

## 2016-05-27 NOTE — Telephone Encounter (Signed)
ok 

## 2016-05-27 NOTE — Telephone Encounter (Signed)
rxs refilled and pt aware Nothing further needed

## 2016-05-27 NOTE — Patient Instructions (Signed)
Medication Instructions:  Your physician recommends that you continue on your current medications as directed. Please refer to the Current Medication list given to you today.   Labwork: BMET/BNP today  Testing/Procedures: Your physician has recommended that you wear an event monitor. Event monitors are medical devices that record the heart's electrical activity. Doctors most often [place] these monitors to diagnose arrhythmias. Arrhythmias are problems with the speed or rhythm of the heartbeat. The monitor is a small, portable device. You can wear one while you do your normal daily activities. This is usually used to diagnose what is causing palpitations/syncope (passing out).  2 week monitor  Follow-Up: Your physician wants you to follow-up in: 6 months with Dr Shirlee Latch in the [facility]. (May 2018), You will receive a reminder letter in the mail two months in advance. If you don't receive a letter, please call our office to schedule the follow-up appointment.        If you need a refill on your cardiac medications before your next appointment, please call your pharmacy.

## 2016-05-28 ENCOUNTER — Encounter: Payer: Self-pay | Admitting: Internal Medicine

## 2016-05-28 DIAGNOSIS — J449 Chronic obstructive pulmonary disease, unspecified: Secondary | ICD-10-CM | POA: Insufficient documentation

## 2016-05-28 LAB — BASIC METABOLIC PANEL
BUN: 21 mg/dL (ref 7–25)
CO2: 24 mmol/L (ref 20–31)
Calcium: 9.6 mg/dL (ref 8.6–10.4)
Chloride: 97 mmol/L — ABNORMAL LOW (ref 98–110)
Creat: 0.53 mg/dL (ref 0.50–1.05)
Glucose, Bld: 105 mg/dL — ABNORMAL HIGH (ref 65–99)
Potassium: 4.6 mmol/L (ref 3.5–5.3)
Sodium: 133 mmol/L — ABNORMAL LOW (ref 135–146)

## 2016-05-28 LAB — BRAIN NATRIURETIC PEPTIDE: Brain Natriuretic Peptide: 16.1 pg/mL (ref <100)

## 2016-05-28 NOTE — Assessment & Plan Note (Signed)
-   sp smoking cessation in 1989 - see f/v loops 08/01/13 c/w upper airway obstruction > refused to repeat off acei  - trial off acei 08/18/2013 > marked improvement - 09/15/2013   Walked RA x about 50 ft and stopped due to  Fatigue s sob or desat - Spiriva trial 09/15/2013 > improved 10/27/13 so continue - added flutter valve 09/05/2014  - 10/25/2014  added dulera 100 2bid based on reported improvement with prednisone> much better  - ono RA 10/25/2014 > neg for desats   - MBS 10/25/2014 > see dysphagia - 08/19/2015  p extensive coaching HFA effectiveness =    75% and has trouble with hand strength limiting  - reported improved on 02 11/18/2015 (purchased on her own)  Symptoms have stabilized on rx for copd and resp failure (see separate a/p)

## 2016-05-28 NOTE — Assessment & Plan Note (Signed)
-   sp smoking cessation in 1989 - see f/v loops 08/01/13 c/w upper airway obstruction > refused to repeat off acei  - trial off acei 08/18/2013 > marked improvement - 09/15/2013   Walked RA x about 50 ft and stopped due to  Fatigue s sob or desat - Spiriva trial 09/15/2013 > improved 10/27/13 so continue - added flutter valve 09/05/2014  - 10/25/2014  added dulera 100 2bid based on reported improvement with prednisone> much better   Not clear how much copd is actually present but doing reasonably well on present rx   I had an extended discussion with the patient /husband reviewing all relevant studies completed to date and  lasting 15 to 20 minutes of a 25 minute visit    Each maintenance medication was reviewed in detail including most importantly the difference between maintenance and prns and under what circumstances the prns are to be triggered using an action plan format that is not reflected in the computer generated alphabetically organized AVS.    Please see instructions for details which were reviewed in writing and the patient given a copy highlighting the part that I personally wrote and discussed at today's ov.

## 2016-05-29 ENCOUNTER — Encounter: Payer: Self-pay | Admitting: Cardiology

## 2016-05-29 NOTE — Progress Notes (Signed)
Patient ID: [name], female   DOB: 1960-09-16, 55 y.o.   MRN: [id number] PCP: Dr. Corliss BlackerMcNeill  55 yo with complicated past medical history presents for evaluation of her cardiomyopathy.  She has a long history of neuromuscular disorder.  The exact diagnosis remains undetermined.  She is confined to a wheelchair.  In 12/13, she was [facility] to [facility] with hypotension, hypothermia, and altered mental status.   She was found to have profound anemia with hemoglobin down to 3.  She was transfused 4 units.  She also was found to have Enterococcal UTI and Serratia PNA.  She was intubated and eventually required tracheostomy.  During her hospitalization, she was found to have a cardiomyopathy with EF 20-25% by echo.  She was treated with milrinone.  Repeat echo in 5/14 showed EF improved to 50-55% with abnormal septal motion and last echo in 9/14 showed EF 55-60%.    Previously lasix was increased to 40 mg in am and 20 mg in pm along with 20 meq of potassium. I offered her a RHC however she declined. PFTs obtained as noted below and showed severe airway obstruction with subsequent evaluation by Dr Sherene SiresWert.  At that time Dr Sherene SiresWert switched lisinopril to irbesartan. He felt that despite severity of her COPD, main limitation was her neuromuscular condition. Event monitor was worn in 1/15 due to tachypalpitations.  This showed PACs.   No chest pain.  She has been sleeping in a chair for 3 years.  No PND.  She has required courses of steroids for COPD/asthma exacerbations.  She is wearing oxygen at all times now, feels like this helps.  Feels like her energy level is better, trying to do more.  She walks with a walker in her house.  Occasional palpitations.    ECG: NSR, iRBBB  Labs (1/14): K 3.9, creatinine 0.21, HCT 30 Labs (4/14): K 4, creatinine 0.4, BNP 19 Labs (10/14): K 3.5, creatinine 0.5 Labs (12/14): K 3.6, creatinine 0.4 Labs (07/28/12): K 3.5 creatinine 0.5 Labs (08/01/13): K 3.5 Creatinine 0.5 Pro BNP  129 Labs (2/16): K 3.8, creatinine 0.44 Labs (9/16): K 3.6, creatinine 0.52, BNP 14  PMH: 1. Anorexia/cachexia/failure to thrive: Patient had a PEG tube.  2. Neuromuscular disease: exact diagnosis is still undetermined despite extensive neurology evaluation.  She does not have multiple sclerosis.  3. Fibromyalgia 4. IBS 5. TMJ syndrome 6. PTSD 7. GERD 8. Sacral decubitus ulcer 9. H/o aspiration 10. 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  Echo (12/13) with EF 20-25% with diffuse hypokinesis, mild MR, moderate RV dilation and moderate RV dysfunction.  Echo (5/14) with EF 50-55%, abnormal septal motion.  Echo (9/14) with EF 55-60%.   - Echo (5/17): EF 60-65%.  11. Anemia 12. COPD: PFTs 08/01/13 with FEV1 0.98 liters (33%), FVC 1.87 (50%), FEV 1/FVC 52%, FEF 25-75% 0.65 liter.  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  Quit smoking > 20 years ago.  No ETOH.     FH: No history of cardiomyopathy.  Grandfather had MI at 3876.   ROS: All systems reviewed and negative except as per HPI.    Current Outpatient Prescriptions  Medication Sig Dispense Refill  . bisoprolol (ZEBETA) 5 MG tablet Take 1 tablet (5 mg total) by mouth daily. 30 tablet 11  . cholecalciferol (VITAMIN D) 1000 UNITS tablet Take 2,000 Units by mouth daily.     .Marland Kitchen  furosemide (LASIX) 20 MG tablet 2 tablets (40mg ) by mouth two times a day 120 tablet 11  . irbesartan (AVAPRO) 75 MG tablet Take 1 tablet (75 mg total) by mouth daily. 30 tablet 11  . levalbuterol (XOPENEX HFA) 45 MCG/ACT inhaler Inhale up to 2 puffs every 4 hours as needed for shortness of breath or wheezing 1 Inhaler 5  . MAGNESIUM PO Take 1 tablet by mouth daily.    . mometasone-formoterol (DULERA) 200-5 MCG/ACT AERO Take 2 puffs first thing in am and then another 2 puffs about 12 hours later. 1 Inhaler 6  . omeprazole (PRILOSEC) 40  MG capsule Take 40 mg by mouth 2 (two) times daily.    . OXYGEN 2 L continuous.     . potassium chloride (KLOR-CON 10) 10 MEQ tablet Take 2 tablets (20 mEq total) by mouth daily. 60 tablet 11  . predniSONE (DELTASONE) 10 MG tablet Take  4 each am x 2 days,   2 each am x 2 days,  1 each am x 2 days and stop 14 tablet 6  . Tiotropium Bromide Monohydrate (SPIRIVA RESPIMAT) 2.5 MCG/ACT AERS Inhale 2 puffs into the lungs every morning. 1 Inhaler 6  . Zinc 30 MG CAPS Take 1 capsule by mouth daily.      No current facility-administered medications for this visit.     BP 104/64   Pulse 76   Ht 5' 5.5" (1.664 m)   Wt 96 lb (43.5 kg)   SpO2 98%   BMI 15.73 kg/m  General: NAD, thin.  Neck: No JVD, no thyromegaly or thyroid nodule.  Lungs: Clear to auscultation bilaterally with normal respiratory effort. Decreased BS throughout.  CV: Nondisplaced PMI.  Heart regular S1/S2, no S3/S4, no murmur.  No ankle edema.  No carotid bruit.  Normal pedal pulses.  Abdomen: Soft, nontender, no hepatosplenomegaly, nondistended.  Neurologic: Alert and oriented x 3.  Psych: Normal affect. Extremities: No clubbing or cyanosis.   Assessment/Plan: 1. Cardiomyopathy: She is not volume overloaded on exam, do not think that dyspnea is due to CHF.  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  She will continue bisoprolol, irbesartan, and Lasix.  She did not tolerate coming off irbesartan in past.  Check BMET/BNP today.   2. Dyspnea: NYHA class III symptoms. Suspect combination of COPD/asthma and deconditioning due to neuromuscular disorder.  As above, doubt CHF.  She is followed closely by Dr [name]. She is wearing oxygen now at all times.   3. Neuromuscular disorder: Not fully characterized.  She is in a wheelchair most of the time but can walk with a walker. I offered to refer her back to neurology in the past but she declined.  4. Palpitations:  PACs on event monitor.     Followup 6 months in CHF clinic.    Ginnifer Creelman,MD 05/29/2016

## 2016-06-08 ENCOUNTER — Ambulatory Visit (INDEPENDENT_AMBULATORY_CARE_PROVIDER_SITE_OTHER): Payer: Managed Care, Other (non HMO)

## 2016-06-08 DIAGNOSIS — R002 Palpitations: Secondary | ICD-10-CM | POA: Diagnosis not present

## 2016-06-17 ENCOUNTER — Telehealth: Payer: Self-pay | Admitting: Cardiology

## 2016-06-17 NOTE — Telephone Encounter (Signed)
FYI: Pt had to stop 2 week heart monitor 6 days early due to large wound created by the electrode and there was physically no where to put it back on--

## 2016-06-24 ENCOUNTER — Telehealth: Payer: Self-pay | Admitting: Cardiology

## 2016-06-24 NOTE — Telephone Encounter (Signed)
New message     FYI Calling to let Dr Shirlee Latch know that the diagnosis code R002 billed with the bnp lab test on 05-27-16 is not a payable diagnosis.

## 2016-06-24 NOTE — Telephone Encounter (Signed)
I attempted to contact French Ana at [place], can use diagnosis code R06.02, shortness of breath. I will forward this message to [facility] billing for follow up

## 2016-09-17 ENCOUNTER — Ambulatory Visit: Payer: Managed Care, Other (non HMO) | Admitting: Internal Medicine

## 2016-11-24 ENCOUNTER — Other Ambulatory Visit: Payer: Self-pay | Admitting: Internal Medicine

## 2016-11-24 ENCOUNTER — Encounter: Payer: Self-pay | Admitting: Internal Medicine

## 2016-11-24 ENCOUNTER — Ambulatory Visit (INDEPENDENT_AMBULATORY_CARE_PROVIDER_SITE_OTHER): Payer: 59 | Admitting: Internal Medicine

## 2016-11-24 VITALS — BP 94/62 | HR 89 | Ht 65.5 in | Wt 98.0 lb

## 2016-11-24 DIAGNOSIS — R0600 Dyspnea, unspecified: Secondary | ICD-10-CM

## 2016-11-24 DIAGNOSIS — J449 Chronic obstructive pulmonary disease, unspecified: Secondary | ICD-10-CM

## 2016-11-24 MED ORDER — LEVALBUTEROL TARTRATE 45 MCG/ACT IN AERO: INHALATION_SPRAY | RESPIRATORY_TRACT | 5 refills | Status: DC

## 2016-11-24 MED ORDER — TIOTROPIUM BROMIDE MONOHYDRATE 2.5 MCG/ACT IN AERS: 2.0000 | INHALATION_SPRAY | RESPIRATORY_TRACT | 6 refills | Status: DC

## 2016-11-24 MED ORDER — MOMETASONE FURO-FORMOTEROL FUM 200-5 MCG/ACT IN AERO: INHALATION_SPRAY | RESPIRATORY_TRACT | 6 refills | Status: DC

## 2016-11-24 NOTE — Progress Notes (Signed)
Subjective:    Patient ID: [name], female    DOB: July 15, 1960   MRN: [id number]    Brief patient profile:  55 yowf quit smoking 1989 then ? MS dx'd early 49's by [facility] (though pt states flatly she doesn't have MS)  and "it's been steadily downhill since" mostly muscle related but since 2012 also with breathing difficulty > admit:  Admit date: 06/06/2012  Discharge date: 06/20/2012  Discharge Diagnoses:  Acute respiratory failure  CAP (community acquired pneumonia) vs aspiration pneumonia  Acute encephalopathy, resolved  Neuromuscular disorder: not fully defined.  Anemia  Thrombocytopenia  Coagulopathy  Acute liver failure  Enterococcal UTI (pansensitive)  Shock circulatory, suspect primarily cardiogenic  Sacral Pressure Ulcer  Dilated cardiomyopathy  Malnutrition compromising bodily function  H/O anorexia nervosa  Dysphagia    History of Present Illness  08/17/2013 1st [facility] office visit/ [name] was able do some adl/s with walker and no 02 summer of 2014  then downhill since  And now sob at rest  X 2 weeks and able to lie down at 45 degrees and can't tol lying flat at all for sev weeks assoc with dry cough day and night and dysphagia/ choking sensation on acei.  Already on gerd rx/ inhaler no benefit.  rec Stop lisinopril Avapro 75 mg one daily  Prilosec 20 mg Take 30- 60 min before your first and last meals of the day  GERD diet     09/15/2013 f/u ov/[name] re: acei vs copd  Chief Complaint  Patient presents with  . Follow-up    Pt states that her SOB/cough has improved greatly.  Pt still c/o SOB but is much better since lv.    limited more by fatigue and weakness than sob, swallowing fine now  rec spiriva 2puffs each am to see if your activity tolerance improves> improved so maint rx    09/05/2014 acute ov/[name] re: worse sob/increase need for saba on spiriva maint rx  Chief Complaint  Patient presents with  . Acute Visit    Pt c/o increased  SOB- started mid Nov 2015, worse for the past month. She is using xopenex 2-3 x per day on average. She c/o cough for the past month- sometimes prod with clear yellow/green sputum. She has had some occ bloody nasal d/c.  Has not been able to lie down for several years, sitting upright in hosp bed due to sob Swallowing worse "because cough is worse" despite  on prilosec 20 bid with meals  Denies choking but all foods are pureed. rec Change omeprazole to where you take 20mg  x 2 Take 30- 60 min before your first and last meals of the day  GERD   Try flutter valve as needed for cough or congestion > ok to cough into the valve if helps    10/25/2014 f/u ov/[name] re: mild copd/ MS with chronic dysphagia/ off pred x 21 days prior to OV   Chief Complaint  Patient presents with  . Acute Visit    Pt c/o decreased o2 sats since 09/26/14- feels fatigued and more SOB. She states that she is also aspirating more and got choked drinking water recently.   while on prednisone did not use any xopenex hfa but prior was using it up 3 x daily  On prednisone cough also goes away  But worse in am's without it. Thinks she may be aspirating more since Nov 2015 >   Convinced her copd is progressing based on her readings  Mucus is minimal, no food in it rec Please see patient coordinator before you leave today  to schedule overnight oximetry> neg   modified barium swallow>MBS 10/29/2014  Diet Recommendations Dysphagia 1 (Puree);Thin liquid;Dysphagia 2  (Fine chop)  Liquid Administration via Cup  Medication Administration Crushed with puree  Compensations Slow rate;Small sips/bites;Multiple dry swallows  after each bite/sip  Postural Changes and/or Swallow Maneuvers Seated upright 90  degrees;Chin tuck Add automatically Dulera 100 Take 2 puffs first thing in am and then another 2 puffs about 12 hours later.  Plan B = backup  Only use your levoalbuterol  If needed  Cough into the flutter valve if having trouble  getting anything up       06/04/2015  f/u ov/[name] re:  Probable copd on spiriva and dulera 100 2bid Chief Complaint  Patient presents with  . Follow-up    Pt c/o increased SOB for the 3 wks. She states that she coughs more when she talks and produces clear sputum.  She has been using her o2 more, and xopenex 1-2 x per wk.   Uses 02 daytime prn  Never uses at hs  Not seeing neuro any more / "the only reason my muscles get worse is that my breathing gets worse" swallowing no change. Prednisone really helps breathing  rec Increase dulera to 200 Take 2 puffs first thing in am and then another 2 puffs about 12 hours later.  02 2lpm at bedtime and during the day goal is  to keep above 90% so ok to adjust up down or off to achieve that goal Prednisone 10 mg take  4 each am x 2 days,   2 each am x 2 days,  1 each am x 2 days and stop   07/12/15 requested pred rx p perfume exp     11/18/2015  f/u ov/[name] re:  ? Copd/ rx dulera 200 /spiriva  Chief Complaint  Patient presents with  . Follow-up    Breathing has not improved since her last visit.   last pred March 2017  For cough > sob > resolved  Becoming more active at home = walking with walker on 02  02 2lpm at sleeping/ 2lpm talking and walking (purchasing on her own as did not qualify)  Gets choked once ever week or so on D II / thin liquid but overall improved on this diet  rec No change in medications or contingency plans - you are good for refills x 6 months minimum  Please schedule a follow up visit in  6  months but call sooner if needed     05/26/2016  f/u ov/[name] re: ? Copd / on dulera 200 / spiriva   [facility]/ refusing further studies  Chief Complaint  Patient presents with  . Follow-up    Pt doing well - Using O2 2 liters 24/7. Using meds as directed - Spiriva, Dulera and Xop with Prednisone PRN. Pt states that her quality of life is much greater with this new treatment plan.  using 02 at hs and with walking 2lpm /  walks around house with walker  -  02 seems to really help though we have not documented ex desats here rec No change rx   11/24/2016  f/u ov/[name] re:  ? Copd/ ab on dulera 200 2bid / spiriva  And prn pred cycles since 05/2015  Chief Complaint  Patient presents with  . Follow-up    Pt states her breathing is doing well.  She  is using xopenex HFA 2 x per wk on average.    avg use of pred cycles maybe once a month chest tight/ cough and last took it 3 days ago  Sleeps close to 90 degrees hosp bed on 2.5 lpm  Adjusts 02 2lpm during tanks only says POC doesn't provide enough but doesn't check sats to verify  No obvious day to day or daytime variability or assoc excess/ purulent sputum or mucus plugs or hemoptysis or cp or chest tightness, subjective wheeze or overt sinus or hb symptoms. No unusual exp hx or h/o childhood pna/ asthma or knowledge of premature birth.  Sleeping ok without nocturnal  or early am exacerbation  of respiratory  c/o's or need for noct saba. Also denies any obvious fluctuation of symptoms with weather or environmental changes or other aggravating or alleviating factors except as outlined above   Current Medications, Allergies, Complete Past Medical History, Past Surgical History, Family History, and Social History were reviewed in [employer] record.  ROS  The following are not active complaints unless bolded sore throat, dysphagia, dental problems, itching, sneezing,  nasal congestion or excess/ purulent secretions, ear ache,   fever, chills, sweats, unintended wt loss, classically pleuritic or exertional cp,  orthopnea pnd or leg swelling, presyncope, palpitations, abdominal pain, anorexia, nausea, vomiting, diarrhea  or change in bowel or bladder habits, change in stools or urine, dysuria,hematuria,  rash, arthralgias, visual complaints, headache, numbness, weakness or ataxia or problems with walking or coordination,  change in mood/affect or  memory.                      Objective:   Physical Exam  Chronically ill thin w/c bound  anxious wf nad   Vital signs reviewed -  - Note on arrival 02 sats  98% on  2lpm        09/15/2013       113  > 10/27/2013   113 > 05/07/2014  111>  112 09/05/2014  > 10/25/2014  110 > 12/06/2014  110 > 03/07/2015  110> 06/04/2015  109 > 08/19/2015  105   > 11/18/2015    102 > 05/26/2016   96     08/17/13 113 lb (51.256 kg)  07/18/13 114 lb 6.4 oz (51.891 kg)  06/21/13 112 lb (50.803 kg)      HEENT: nl dentition, turbinates, and oropharynx. Nl external ear canals without cough reflex   NECK :  without JVD/Nodes/TM/ nl carotid upstrokes bilaterally   LUNGS: no acc muscle use, clear to A and P    CV:  RRR  no s3 or murmur or increase in P2, no edema   ABD:  soft and nontender with nl excursion in the supine position. No bruits or organomegaly, bowel sounds nl  MS:  warm without deformities, calf tenderness, cyanosis or clubbing      CXR PA and Lateral:   06/04/2015 :    I personally reviewed images and agree with radiology impression as follows:   Hyperinflation suggests emphysema, given the clinical history of COPD. Marland Kitchen         Assessment & Plan:

## 2016-11-24 NOTE — Patient Instructions (Addendum)
No change in therapy   Please schedule a follow up visit in 6 months but call sooner if needed  with all medications /inhalers/ solutions in hand so we can verify exactly what you are taking. This includes all medications from all doctors and over the counters

## 2016-11-25 ENCOUNTER — Telehealth: Payer: Self-pay | Admitting: Internal Medicine

## 2016-11-25 MED ORDER — PREDNISONE 10 MG PO TABS: ORAL_TABLET | ORAL | 11 refills | Status: DC

## 2016-11-25 NOTE — Telephone Encounter (Signed)
Pt is calling for Prednisone which was not sent to pharmacy at OV 11/24/16 Please advise Dr [name] on instructions of use. Thanks.

## 2016-11-25 NOTE — Telephone Encounter (Signed)
Patient returned phone call, pt states the medication was to be called in with the medications from yesterday OV, only 3 medications were available for pick up...ert

## 2016-11-25 NOTE — Telephone Encounter (Signed)
MW please advise if ok to refill the prednisone.  thanks

## 2016-11-25 NOTE — Assessment & Plan Note (Signed)
-   sp smoking cessation in 1989 - see f/v loops 08/01/13 c/w upper airway obstruction > refused to repeat off acei  - trial off acei 08/18/2013 > marked improvement - 09/15/2013   Walked RA x about 50 ft and stopped due to  Fatigue s sob or desat - Spiriva trial 09/15/2013 > improved 10/27/13 so continue - added flutter valve 09/05/2014  - 10/25/2014  added dulera 100 2bid based on reported improvement with prednisone> much better  - ono RA 10/25/2014 > neg for desats   - MBS 10/25/2014 > see dysphagia - 05/2015  Started 6 day cycles of empirical  prednisone  For flares  - 08/19/2015  p extensive coaching HFA effectiveness =    75% and has trouble with hand strength limiting  - reported improved on 02 11/18/2015 (purchased on her own)  No change rx feasible at this point

## 2016-11-25 NOTE — Telephone Encounter (Signed)
She can have 12 refills per year on Prednisone 10 mg take  4 each am x 2 days,   2 each am x 2 days,  1 each am x 2 days and stop   = 14 each

## 2016-11-25 NOTE — Telephone Encounter (Signed)
Pt aware of refills.  rx sent to pharmacy with appropriate refills.  Nothing further needed.

## 2016-11-25 NOTE — Assessment & Plan Note (Signed)
-   sp smoking cessation in 1989 - see f/v loops 08/01/13 c/w upper airway obstruction > refused to repeat off acei  - trial off acei 08/18/2013 > marked improvement - 09/15/2013   Walked RA x about 50 ft and stopped due to  Fatigue s sob or desat - Spiriva trial 09/15/2013 > improved 10/27/13 so continue - added flutter valve 09/05/2014  - 10/25/2014  added dulera 100 2bid based on reported improvement with prednisone> much better - requiring prn pred since 05/2015 s documentation it helps (refuses pfts)    She says she's doing fine but note using more and more meds s object support that she needs them  I made her aware again of the risks of prednisone- Discussed in detail all the  indications, usual  risks and alternatives  relative to the benefits with patient who again declined further testing in favor of empirical rx   I had an extended discussion with the patient/husband  reviewing all relevant studies completed to date and  lasting 15 to 20 minutes of a 25 minute visit    Each maintenance medication was reviewed in detail including most importantly the difference between maintenance and prns and under what circumstances the prns are to be triggered using an action plan format that is not reflected in the computer generated alphabetically organized AVS.    Please see AVS for specific instructions unique to this visit that I personally wrote and verbalized to the the pt in detail and then reviewed with pt  by my nurse highlighting any  changes in therapy recommended at today's visit to their plan of care.

## 2016-11-25 NOTE — Telephone Encounter (Signed)
LM x 1 

## 2016-11-25 NOTE — Telephone Encounter (Signed)
Patient is calling back stating that prednisone is ready at the pharmacy for pick up but there are no refills.  She states Dr. [name] always writes with 6 refills or 12 refills in the past and states that they discussed her continuing on the prednisone yesterday.  Pharmacy is [facility], patient's CB is [phone]. Please call patient and advise if refills are ok and when this has been sent over.

## 2016-11-25 NOTE — Telephone Encounter (Signed)
MW please also advise on refills- patient is requesting multiple refills on prednisone.  Thank you.   11/24/16 AVS:  Instructions  No change in therapy    Please schedule a follow up visit in 6 months but call sooner if needed  with all medications /inhalers/ solutions in hand so we can verify exactly what you are taking. This includes all medications from all doctors and over the counters

## 2016-12-01 ENCOUNTER — Ambulatory Visit (HOSPITAL_COMMUNITY)
Admission: RE | Admit: 2016-12-01 | Discharge: 2016-12-01 | Disposition: A | Discharge status: Home / Self Care | Payer: 59 | Source: Ambulatory Visit | Attending: Cardiology | Admitting: Cardiology

## 2016-12-01 ENCOUNTER — Encounter (HOSPITAL_COMMUNITY): Payer: Self-pay

## 2016-12-01 VITALS — BP 92/50 | HR 81 | Wt 96.0 lb

## 2016-12-01 DIAGNOSIS — K589 Irritable bowel syndrome without diarrhea: Secondary | ICD-10-CM | POA: Insufficient documentation

## 2016-12-01 DIAGNOSIS — F4310 Post-traumatic stress disorder, unspecified: Secondary | ICD-10-CM | POA: Insufficient documentation

## 2016-12-01 DIAGNOSIS — Z87891 Personal history of nicotine dependence: Secondary | ICD-10-CM | POA: Insufficient documentation

## 2016-12-01 DIAGNOSIS — R002 Palpitations: Secondary | ICD-10-CM | POA: Insufficient documentation

## 2016-12-01 DIAGNOSIS — R627 Adult failure to thrive: Secondary | ICD-10-CM | POA: Insufficient documentation

## 2016-12-01 DIAGNOSIS — R630 Anorexia: Secondary | ICD-10-CM | POA: Insufficient documentation

## 2016-12-01 DIAGNOSIS — M792 Neuralgia and neuritis, unspecified: Secondary | ICD-10-CM | POA: Insufficient documentation

## 2016-12-01 DIAGNOSIS — K219 Gastro-esophageal reflux disease without esophagitis: Secondary | ICD-10-CM | POA: Insufficient documentation

## 2016-12-01 DIAGNOSIS — I5022 Chronic systolic (congestive) heart failure: Secondary | ICD-10-CM

## 2016-12-01 DIAGNOSIS — J449 Chronic obstructive pulmonary disease, unspecified: Secondary | ICD-10-CM | POA: Diagnosis not present

## 2016-12-01 DIAGNOSIS — I429 Cardiomyopathy, unspecified: Secondary | ICD-10-CM | POA: Insufficient documentation

## 2016-12-01 DIAGNOSIS — R0602 Shortness of breath: Secondary | ICD-10-CM

## 2016-12-01 DIAGNOSIS — M797 Fibromyalgia: Secondary | ICD-10-CM | POA: Diagnosis not present

## 2016-12-01 DIAGNOSIS — G709 Myoneural disorder, unspecified: Secondary | ICD-10-CM | POA: Diagnosis not present

## 2016-12-01 DIAGNOSIS — I471 Supraventricular tachycardia: Secondary | ICD-10-CM | POA: Diagnosis not present

## 2016-12-01 DIAGNOSIS — L89159 Pressure ulcer of sacral region, unspecified stage: Secondary | ICD-10-CM | POA: Diagnosis not present

## 2016-12-01 LAB — BASIC METABOLIC PANEL
Anion gap: 7 (ref 5–15)
BUN: 16 mg/dL (ref 6–20)
CO2: 27 mmol/L (ref 22–32)
Calcium: 9.5 mg/dL (ref 8.9–10.3)
Chloride: 103 mmol/L (ref 101–111)
Creatinine, Ser: 0.53 mg/dL (ref 0.44–1.00)
GFR calc Af Amer: >60 mL/min (ref >60)
GFR calc non Af Amer: >60 mL/min (ref >60)
Glucose, Bld: 106 mg/dL — ABNORMAL HIGH (ref 65–99)
Potassium: 3.7 mmol/L (ref 3.5–5.1)
Sodium: 137 mmol/L (ref 135–145)

## 2016-12-01 NOTE — Progress Notes (Signed)
Patient ID: [name], female   DOB: Mar 23, 1961, 56 y.o.   MRN: [id number] PCP: Dr. Corliss Blacker Cardiology: Dr. Shirlee Latch  56 yo with complicated past medical history presents for evaluation of her cardiomyopathy.  She has a long history of neuromuscular disorder.  The exact diagnosis remains undetermined.  She is confined to a wheelchair.  In 12/13, she was admitted to [facility] with hypotension, hypothermia, and altered mental status.   She was found to have profound anemia with hemoglobin down to 3.  She was transfused 4 units.  She also was found to have Enterococcal UTI and Serratia PNA.  She was intubated and eventually required tracheostomy.  During her hospitalization, she was found to have a cardiomyopathy with EF 20-25% by echo.  She was treated with milrinone.  Repeat echo in 5/14 showed EF improved to 50-55% with abnormal septal motion and last echo in 9/14 showed EF 55-60%.    Previously lasix was increased to 40 mg in am and 20 mg in pm along with 20 meq of potassium. I offered her a RHC however she declined. PFTs obtained as noted below and showed severe airway obstruction with subsequent evaluation by Dr [name].  At that time Dr [name] switched lisinopril to irbesartan. He felt that despite severity of her COPD, main limitation was her neuromuscular condition. Event monitor was worn in 1/15 due to tachypalpitations.  This showed PACs.  Event monitor was worn again in 12/17, this again showed PACs with short runs of atrial tachycardia.   No chest pain.  She has been sleeping in a chair for years now.  No PND.  She has required courses of steroids for COPD/asthma exacerbations, she feels like her COPD is worsening.  She is wearing oxygen at all times now, feels like this helps.  She walks with a walker in her house with mild dyspnea, uses wheelchair outside her house.  More fatigue overall.  She feels palpitations only rarely now.  Weight is stable.    ECG (personally reviewed): NSR, normal.  Labs  (1/14): K 3.9, creatinine 0.21, HCT 30 Labs (4/14): K 4, creatinine 0.4, BNP 19 Labs (10/14): K 3.5, creatinine 0.5 Labs (12/14): K 3.6, creatinine 0.4 Labs (07/28/12): K 3.5 creatinine 0.5 Labs (08/01/13): K 3.5 Creatinine 0.5 Pro BNP 129 Labs (2/16): K 3.8, creatinine 0.44 Labs (9/16): K 3.6, creatinine 0.52, BNP 14 Labs (11/17): K 4.6, creatinine 0.53, BNP 16  PMH: 1. Anorexia/cachexia/failure to thrive: Patient had a PEG tube.  2. Neuromuscular disease: exact diagnosis is still undetermined despite extensive neurology evaluation.  She does not have multiple sclerosis.  3. Fibromyalgia 4. IBS 5. TMJ syndrome 6. PTSD 7. GERD 8. Sacral decubitus ulcer 9. H/o aspiration 10. 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  Echo (12/13) with EF 20-25% with diffuse hypokinesis, mild MR, moderate RV dilation and moderate RV dysfunction.  Echo (5/14) with EF 50-55%, abnormal septal motion.  Echo (9/14) with EF 55-60%.   - Echo (5/17): EF 60-65%.  11. Anemia 12. COPD: PFTs 08/01/13 with FEV1 0.98 liters (33%), FVC 1.87 (50%), FEV 1/FVC 52%, FEF 25-75% 0.65 liter.  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 Palpitations: Event monitor in 12/17 showed PACs and short runs of atrial tachycardia.   SH: Lives with husband in [place].  Quit smoking > 20 years ago.  No ETOH.     FH: No history of cardiomyopathy.  Grandfather had MI at 54.  ROS: All systems reviewed and negative except as per HPI.    Current Outpatient Prescriptions  Medication Sig Dispense Refill  . bisoprolol (ZEBETA) 5 MG tablet Take 1 tablet (5 mg total) by mouth daily. 30 tablet 11  . cholecalciferol (VITAMIN D) 1000 UNITS tablet Take 2,000 Units by mouth daily.     . furosemide (LASIX) 20 MG tablet 2 tablets (40mg ) by mouth two times a day 120 tablet 11  . irbesartan (AVAPRO) 75 MG tablet Take 1 tablet (75 mg total) by mouth daily.  30 tablet 11  . levalbuterol (XOPENEX HFA) 45 MCG/ACT inhaler Inhale up to 2 puffs every 4 hours as needed for shortness of breath or wheezing 1 Inhaler 5  . MAGNESIUM PO Take 1 tablet by mouth daily.    . mometasone-formoterol (DULERA) 200-5 MCG/ACT AERO Take 2 puffs first thing in am and then another 2 puffs about 12 hours later. 1 Inhaler 6  . omeprazole (PRILOSEC) 40 MG capsule Take 40 mg by mouth 2 (two) times daily.    . OXYGEN 2 L continuous.     . potassium chloride (KLOR-CON 10) 10 MEQ tablet Take 2 tablets (20 mEq total) by mouth daily. 60 tablet 11  . predniSONE (DELTASONE) 10 MG tablet 40mg X2 days, 20mg  X2 days, 10mg X2 days, then stop. 14 tablet 11  . Tiotropium Bromide Monohydrate (SPIRIVA RESPIMAT) 2.5 MCG/ACT AERS Inhale 2 puffs into the lungs every morning. 1 Inhaler 6  . Zinc 30 MG CAPS Take 1 capsule by mouth daily.      No current facility-administered medications for this encounter.     BP (!) 92/50   Pulse 81   Wt 96 lb (43.5 kg)   SpO2 100% Comment: on 2L of O2  BMI 15.73 kg/m  General: NAD, thin.  Neck: JVP 7 cm, no thyromegaly or thyroid nodule.  Lungs: Mildly decreased breath sounds bilaterally.   CV: Nondisplaced PMI.  Heart regular S1/S2, no S3/S4, no murmur.  No ankle edema.  No carotid bruit.  Normal pedal pulses.  Abdomen: Soft, nontender, no hepatosplenomegaly, nondistended.  Neurologic: Alert and oriented x 3.  Psych: Normal affect. Extremities: No clubbing or cyanosis.   Assessment/Plan: 1. Cardiomyopathy: She is not volume overloaded on exam, do not think that dyspnea is due to CHF.  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 - She will continue bisoprolol, irbesartan, and Lasix.  She did not tolerate coming off irbesartan in past.   - Given increased fatigue, I will have her get a repeat echo.  2. Dyspnea: NYHA class III symptoms. Suspect combination of COPD/asthma and  deconditioning due to neuromuscular disorder.  As above, doubt CHF.  She is followed closely by Dr [name]. She is wearing oxygen now at all times.   3. Neuromuscular disorder: Not fully characterized.  She is in a wheelchair most of the time but can walk with a walker. I have offered to refer her back to neurology in the past but she declined.  4. Palpitations: PACs + short runs atrial tachycardia on event monitor in 12/17.  Minimal symptoms now.  Continue bisoprolol.     Followup 6 months.    Lounell Schumacher,MD 12/01/2016

## 2016-12-01 NOTE — Patient Instructions (Signed)
Lab today  Your physician has requested that you have an echocardiogram. Echocardiography is a painless test that uses sound waves to create images of your heart. It provides your doctor with information about the size and shape of your heart and how well your heart's chambers and valves are working. This procedure takes approximately one hour. There are no restrictions for this procedure.  We will contact you in 6 months to schedule your next appointment.  

## 2017-01-01 ENCOUNTER — Other Ambulatory Visit (HOSPITAL_COMMUNITY): Payer: 59

## 2017-01-19 ENCOUNTER — Ambulatory Visit (HOSPITAL_COMMUNITY): Payer: 59 | Attending: Cardiology

## 2017-01-19 ENCOUNTER — Other Ambulatory Visit: Payer: Self-pay

## 2017-01-19 DIAGNOSIS — I5022 Chronic systolic (congestive) heart failure: Secondary | ICD-10-CM | POA: Diagnosis not present

## 2017-01-20 ENCOUNTER — Telehealth (HOSPITAL_COMMUNITY): Payer: Self-pay | Admitting: *Deleted

## 2017-01-20 NOTE — Telephone Encounter (Signed)
ECHOCARDIOGRAM COMPLETE  Order: [id number]  Status:  Final result Visible to patient:  Yes (MyChart) Dx:  Chronic systolic CHF (congestive hear...  Notes recorded by Georgina Peer, RN on 01/20/2017 at 9:34 AM EDT Called and spoke with patient, she is aware and no further questions. ------  Notes recorded by Laurey Morale, MD on 01/19/2017 at 9:59 PM EDT Study appears normal

## 2017-02-28 ENCOUNTER — Emergency Department (HOSPITAL_COMMUNITY): Payer: 59

## 2017-02-28 ENCOUNTER — Emergency Department (HOSPITAL_COMMUNITY)
Admission: EM | Admit: 2017-02-28 | Discharge: 2017-03-01 | Disposition: A | Discharge status: Home / Self Care | Payer: 59 | Attending: Emergency Medicine | Admitting: Emergency Medicine

## 2017-02-28 ENCOUNTER — Encounter (HOSPITAL_COMMUNITY): Payer: Self-pay | Admitting: *Deleted

## 2017-02-28 DIAGNOSIS — Z79899 Other long term (current) drug therapy: Secondary | ICD-10-CM | POA: Insufficient documentation

## 2017-02-28 DIAGNOSIS — Z87891 Personal history of nicotine dependence: Secondary | ICD-10-CM | POA: Insufficient documentation

## 2017-02-28 DIAGNOSIS — Y929 Unspecified place or not applicable: Secondary | ICD-10-CM | POA: Diagnosis not present

## 2017-02-28 DIAGNOSIS — Y999 Unspecified external cause status: Secondary | ICD-10-CM | POA: Diagnosis not present

## 2017-02-28 DIAGNOSIS — M79605 Pain in left leg: Secondary | ICD-10-CM

## 2017-02-28 DIAGNOSIS — J449 Chronic obstructive pulmonary disease, unspecified: Secondary | ICD-10-CM | POA: Diagnosis not present

## 2017-02-28 DIAGNOSIS — I5022 Chronic systolic (congestive) heart failure: Secondary | ICD-10-CM | POA: Insufficient documentation

## 2017-02-28 DIAGNOSIS — X509XXA Other and unspecified overexertion or strenuous movements or postures, initial encounter: Secondary | ICD-10-CM | POA: Diagnosis not present

## 2017-02-28 DIAGNOSIS — Y9389 Activity, other specified: Secondary | ICD-10-CM | POA: Insufficient documentation

## 2017-02-28 NOTE — ED Notes (Signed)
Patient transported to X-ray 

## 2017-02-28 NOTE — ED Triage Notes (Signed)
The pt is c/o rt lower leg pain after a fall one week ago she is hurting mid tib-fib and she is also c/o pain in her rt knee and her rt hip  She is on home 02 continuously  Numerus medical problems  She walks with a walker  She is 75% bed ridden

## 2017-03-01 MED ORDER — DICLOFENAC SODIUM 1 % TD GEL
2.0000 g | Freq: Four times a day (QID) | TRANSDERMAL | Status: DC
  Administered 2017-03-01: 01:00:00 2 g via TOPICAL
  Filled 2017-03-01: qty 100

## 2017-03-01 NOTE — ED Provider Notes (Signed)
[facility]-EMERGENCY DEPT Provider Note   CSN: [id number] Arrival date & time: 02/28/17  2056     History   Chief Complaint Chief Complaint  Patient presents with  . Leg Pain    HPI [name] is a 56 y.o. female presenting with right leg pain.  Patient states that a week ago, she twisted to close the door and at that time felt sharp pain of her right shin. Due to this pain, she then fell and landed on her left side. She denies injury of the left side. She denies hitting her head. She reports that initially the pain of her right leg was improved, however she was not walking very much at this time. Over the past week, she's been walking more, and her pain has been getting worse with walking. She has less pain while at rest. Pain is in her right hip, lateral right thigh, lateral right knee, and lateral upper calf. She has no pain in her foot or ankle. He denies numbness or tingling. She is ambulatory with pain, using as walker at baseline. She is not on blood thinners. She states she is taking Tylenol daily, but this is not helping. 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 She cannot take anti-inflammatories, as it causes GI bleeds. Patient with a history of osteoporosis. She is concerned that she broke a bone.  HPI  Past Medical History:  Diagnosis Date  . Acute liver failure   . Anemia   . Anorexia nervosa    h/o   . CAP (community acquired pneumonia)   . Cardiomyopathy (HCC)   . Difficult intubation    secondary to jaw muscles  . Fibromyalgia   . Fibromyalgia syndrome   . GERD (gastroesophageal reflux disease)   . IBS (irritable bowel syndrome)   . MS (multiple sclerosis) (HCC)   . PTSD (post-traumatic stress disorder)   . Sacral decubitus ulcer   . Shock circulatory (HCC)   . TMJ disease     Patient Active Problem List   Diagnosis Date Noted  . COPD GOLD 0 and refuses further testing 05/28/2016  . Upper airway  cough syndrome 08/19/2015  . Dysphagia, pharyngoesophageal phase 10/29/2014  . Left-sided chest wall pain 05/07/2014  . Dyspnea 08/18/2013  . Palpitations 07/18/2013  . Chronic systolic CHF (congestive heart failure) (HCC) 08/30/2012  . Tracheostomy status (HCC) 07/07/2012  . Physical deconditioning 07/07/2012  . Dilated cardiomyopathy (HCC) 06/09/2012  . Malnutrition compromising bodily function (HCC) 06/09/2012  . H/O anorexia nervosa 06/09/2012  . Enterococcal UTI (pansensitive) 06/08/2012  . Acute encephalopathy, resolved 06/07/2012  . Anemia 06/07/2012  . Thrombocytopenia (HCC) 06/07/2012  . Coagulopathy (HCC) 06/07/2012  . Acute liver failure 06/07/2012  . Neuromuscular disorder: not fully defined.  06/07/2012    Past Surgical History:  Procedure Laterality Date  . BUNIONECTOMY    . CESAREAN SECTION    . DILATION AND CURETTAGE OF UTERUS  2007  . TRACHEOSTOMY  06/2012  . TUBAL LIGATION      OB History    No data available       Home Medications    Prior to Admission medications   Medication Sig Start Date End Date Taking? Authorizing Provider  bisoprolol (ZEBETA) 5 MG tablet Take 1 tablet (5 mg total) by mouth daily. 05/27/16   Laurey Morale, MD  cholecalciferol (VITAMIN D) 1000 UNITS tablet Take 2,000 Units by mouth daily.  06/20/12  Jeanella Craze, NP  furosemide (LASIX) 20 MG tablet 2 tablets (40mg ) by mouth two times a day 05/27/16   Laurey Morale, MD  irbesartan (AVAPRO) 75 MG tablet Take 1 tablet (75 mg total) by mouth daily. 05/27/16   Laurey Morale, MD  levalbuterol [facility] HFA) 45 MCG/ACT inhaler Inhale up to 2 puffs every 4 hours as needed for shortness of breath or wheezing 11/24/16   Nyoka Cowden, MD  MAGNESIUM PO Take 1 tablet by mouth daily.    [provider]  mometasone-formoterol (DULERA) 200-5 MCG/ACT AERO Take 2 puffs first thing in am and then another 2 puffs about 12 hours later. 11/24/16   Nyoka Cowden, MD  omeprazole  (PRILOSEC) 40 MG capsule Take 40 mg by mouth 2 (two) times daily.    [provider]  OXYGEN 2 L continuous.     [provider]  potassium chloride (KLOR-CON 10) 10 MEQ tablet Take 2 tablets (20 mEq total) by mouth daily. 05/27/16   Laurey Morale, MD  predniSONE (DELTASONE) 10 MG tablet 40mg X2 days, 20mg  X2 days, 10mg X2 days, then stop. 11/25/16   Nyoka Cowden, MD  Tiotropium Bromide Monohydrate (SPIRIVA RESPIMAT) 2.5 MCG/ACT AERS Inhale 2 puffs into the lungs every morning. 11/24/16   Nyoka Cowden, MD  Zinc 30 MG CAPS Take 1 capsule by mouth daily.     [provider]    Family History Family History  Problem Relation Age of Onset  . Asthma Mother   . Uterine cancer Mother   . Skin cancer Mother   . Asthma Father   . Cancer Father        ? type  . Asthma Brother   . Asthma Sister   . Heart disease Maternal Uncle   . Heart disease Maternal Grandfather   . Thyroid cancer Brother   . Multiple sclerosis Brother   . Multiple sclerosis Unknown     Social History Social History  Substance Use Topics  . Smoking status: Former Smoker    Packs/day: 1.00    Years: 11.00    Types: Cigarettes    Quit date: 07/07/1987  . Smokeless tobacco: Never Used  . Alcohol use No     Allergies   Scopolamine; Ivp dye [iodinated diagnostic agents]; Lisinopril; and Carvedilol   Review of Systems Review of Systems  Musculoskeletal: Positive for arthralgias and myalgias. Negative for back pain and neck pain.  Skin: Negative for wound.  Neurological: Negative for dizziness, light-headedness, numbness and headaches.  Hematological: Does not bruise/bleed easily.  Psychiatric/Behavioral: Negative for confusion.     Physical Exam Updated Vital Signs BP (!) 95/50 (BP Location: Right Arm)   Pulse 78   Temp 98.9 F (37.2 C) (Oral)   Resp 14   Ht 5\' 3"  (1.6 m)   Wt 44.5 kg (98 lb)   SpO2 99%   BMI 17.36 kg/m   Physical Exam  Constitutional: She is oriented  to person, place, and time. She appears well-developed and well-nourished. No distress.  HENT:  Head: Normocephalic and atraumatic.  Eyes: EOM are normal.  Neck: Normal range of motion.  Pulmonary/Chest: Effort normal.  Abdominal: She exhibits no distension.  Musculoskeletal: Normal range of motion.       Left hip: Normal.       Left knee: Normal.       Left ankle: Normal.       Cervical back: Normal.       Thoracic  back: Normal.       Lumbar back: Normal.  Patient with tenderness to palpation of posterior right hip. No obvious bruising, laceration, or injury. Additional tenderness palpation of lateral thigh and knee. No obvious swelling or injuries. No tenderness to palpation of knee joint line, posterior knee, and medial ligaments. Tenderness to palpation of lateral upper calf. Note tenderness of posterior calf. No tenderness palpation of ankle or foot. Pedal pulses intact bilaterally. Color and warmth equal bilaterally. Strength equal bilaterally. Sensation intact bilaterally. Compartments soft. Pt ambulatory with walker (baseline).   Neurological: She is alert and oriented to person, place, and time. No sensory deficit.  Skin: Skin is warm. No rash noted.  Psychiatric: She has a normal mood and affect.  Nursing note and vitals reviewed.    ED Treatments / Results  Labs (all labs ordered are listed, but only abnormal results are displayed) Labs Reviewed - No data to display  EKG  EKG Interpretation None       Radiology Dg Tibia/fibula Right  Result Date: 03/01/2017 CLINICAL DATA:  Fall with pain and bruising to the tibia EXAM: RIGHT TIBIA AND FIBULA - 2 VIEW COMPARISON:  None. FINDINGS: Osteopenia. No fracture or malalignment. Soft tissues are unremarkable IMPRESSION: No acute osseous abnormality Electronically Signed   By: Jasmine Pang M.D.   On: 03/01/2017 00:19   Dg Knee Complete 4 Views Right  Result Date: 03/01/2017 CLINICAL DATA:  Fall with pain to the right knee  EXAM: RIGHT KNEE - COMPLETE 4+ VIEW COMPARISON:  None. FINDINGS: No fracture or malalignment. No large joint effusion. Mild patellofemoral degenerative changes. Soft tissue swelling medially. IMPRESSION: Mild degenerative changes.  No acute osseous abnormality. Electronically Signed   By: Jasmine Pang M.D.   On: 03/01/2017 00:18   Dg Hip Unilat W Or Wo Pelvis 2-3 Views Right  Result Date: 03/01/2017 CLINICAL DATA:  Larey Seat at home 1 week ago pain to the posterior sacral region, history of right sacral decubitus ulcer EXAM: DG HIP (WITH OR WITHOUT PELVIS) 2-3V RIGHT COMPARISON:  None. FINDINGS: SI joints are symmetric. Calcified pelvic phleboliths. Pubic symphysis is intact. No fracture or dislocation. Bony remodeling of the right ischial bone with sclerosis and irregularity IMPRESSION: 1. No acute fracture or dislocation 2. Bony remodeling of the right ischial bone with sclerosis and cortex irregularity, could be related to history of sacral decubitus ulcer or sequela of prior infection. Electronically Signed   By: Jasmine Pang M.D.   On: 03/01/2017 00:17    Procedures Procedures (including critical care time)  Medications Ordered in ED Medications - No data to display   Initial Impression / Assessment and Plan / ED Course  I have reviewed the triage vital signs and the nursing notes.  Pertinent labs & imaging results that were available during my care of the patient were reviewed by me and considered in my medical decision making (see chart for details).     Pt presenting with lateral left leg pain x1 wk. Pain began before pt fell, with a twisting motion. No injury form the fall. No specific sites of tenderness, but as pt is worried about fx, will obtain xray of hip, knee, and tib/fib. Physical exam reassuring, as pt neurovascularly intact with soft compartments. Bending knee easily to point out areas of pain. No posterior calf swelling, doubt DVT.  Xrays negative for fx or dislocation.  Discussed findings with pt. Discussed option of using topical voltaren gel for pain control. Pt states she would like  to try this. Likely muscular/ligament/tendon pain. Pt to f/u with PCP if pain is not improving. Return precautions given. Pt states she understands and agrees to plan.   Final Clinical Impressions(s) / ED Diagnoses   Final diagnoses:  Left leg pain    New Prescriptions Discharge Medication List as of 03/01/2017 12:40 AM       Deklyn Trachtenberg, PA-C 03/01/17 1416    Rolan Bucco, MD 03/01/17 1600

## 2017-03-01 NOTE — Discharge Instructions (Signed)
You may use the diclofenac gel for further pain control. Continue to take daily Tylenol. You have no restrictions in terms of movement, but you might continue to have some soreness when you walk.  Follow-up with your primary care doctor in 1 week if your pain is not improving. Return to the emergency room if you develop numbness, tingling, change in color of her foot, or any new or worsening symptoms.

## 2017-06-01 ENCOUNTER — Encounter: Payer: Self-pay | Admitting: Internal Medicine

## 2017-06-01 ENCOUNTER — Ambulatory Visit (INDEPENDENT_AMBULATORY_CARE_PROVIDER_SITE_OTHER): Payer: 59 | Admitting: Internal Medicine

## 2017-06-01 ENCOUNTER — Other Ambulatory Visit (HOSPITAL_COMMUNITY): Payer: Self-pay | Admitting: Cardiology

## 2017-06-01 VITALS — BP 94/60 | HR 70 | Ht 63.0 in | Wt 100.0 lb

## 2017-06-01 DIAGNOSIS — I5022 Chronic systolic (congestive) heart failure: Secondary | ICD-10-CM

## 2017-06-01 DIAGNOSIS — R0602 Shortness of breath: Secondary | ICD-10-CM

## 2017-06-01 DIAGNOSIS — R05 Cough: Secondary | ICD-10-CM | POA: Diagnosis not present

## 2017-06-01 DIAGNOSIS — R058 Other specified cough: Secondary | ICD-10-CM

## 2017-06-01 DIAGNOSIS — R600 Localized edema: Secondary | ICD-10-CM

## 2017-06-01 DIAGNOSIS — J449 Chronic obstructive pulmonary disease, unspecified: Secondary | ICD-10-CM | POA: Diagnosis not present

## 2017-06-01 DIAGNOSIS — R1900 Intra-abdominal and pelvic swelling, mass and lump, unspecified site: Secondary | ICD-10-CM

## 2017-06-01 MED ORDER — PREDNISONE 10 MG PO TABS: ORAL_TABLET | ORAL | 11 refills | Status: DC

## 2017-06-01 MED ORDER — LEVALBUTEROL TARTRATE 45 MCG/ACT IN AERO: INHALATION_SPRAY | RESPIRATORY_TRACT | 5 refills | Status: DC

## 2017-06-01 NOTE — Progress Notes (Signed)
Subjective:    Patient ID: [name], female    DOB: December 18, 1960   MRN: [id number]    Brief patient profile:  56 yowf quit smoking 1989 then ? MS dx'd early 80's by [facility] (though pt states flatly she doesn't have MS)  and "it's been steadily downhill since" mostly muscle related but since 2012 also with breathing difficulty > admit:   Admit date: 06/06/2012  Discharge date: 06/20/2012  Discharge Diagnoses:  Acute respiratory failure  CAP (community acquired pneumonia) vs aspiration pneumonia  Acute encephalopathy, resolved  Neuromuscular disorder: not fully defined.  Anemia  Thrombocytopenia  Coagulopathy  Acute liver failure  Enterococcal UTI (pansensitive)  Shock circulatory, suspect primarily cardiogenic  Sacral Pressure Ulcer  Dilated cardiomyopathy  Malnutrition compromising bodily function  H/O anorexia nervosa  Dysphagia    History of Present Illness  08/17/2013 1st [facility] office visit/ [name] was able do some adl/s with walker and no 02 summer of 2014  then downhill since  And now sob at rest  X 2 weeks and able to lie down at 45 degrees and can't tol lying flat at all for sev weeks assoc with dry cough day and night and dysphagia/ choking sensation on acei.  Already on gerd rx/ inhaler no benefit.  rec Stop lisinopril Avapro 75 mg one daily  Prilosec 20 mg Take 30- 60 min before your first and last meals of the day  GERD diet     09/15/2013 f/u ov/Wert re: acei vs copd  Chief Complaint  Patient presents with  . Follow-up    Pt states that her SOB/cough has improved greatly.  Pt still c/o SOB but is much better since lv.    limited more by fatigue and weakness than sob, swallowing fine now  rec spiriva 2puffs each am to see if your activity tolerance improves> improved so maint rx    09/05/2014 acute ov/Wert re: worse sob/increase need for saba on spiriva maint rx  Chief Complaint  Patient presents with  . Acute Visit    Pt c/o increased  SOB- started mid Nov 2015, worse for the past month. She is using xopenex 2-3 x per day on average. She c/o cough for the past month- sometimes prod with clear yellow/green sputum. She has had some occ bloody nasal d/c.  Has not been able to lie down for several years, sitting upright in hosp bed due to sob Swallowing worse "because cough is worse" despite  on prilosec 20 bid with meals  Denies choking but all foods are pureed. rec Change omeprazole to where you take 20mg  x 2 Take 30- 60 min before your first and last meals of the day  GERD   Try flutter valve as needed for cough or congestion > ok to cough into the valve if helps    10/25/2014 f/u ov/Wert re: mild copd/ MS with chronic dysphagia/ off pred x 21 days prior to OV   Chief Complaint  Patient presents with  . Acute Visit    Pt c/o decreased o2 sats since 09/26/14- feels fatigued and more SOB. She states that she is also aspirating more and got choked drinking water recently.   while on prednisone did not use any xopenex hfa but prior was using it up 3 x daily  On prednisone cough also goes away  But worse in am's without it. Thinks she may be aspirating more since Nov 2015 >   Convinced her copd is progressing based on her  readings Mucus is minimal, no food in it rec Please see patient coordinator before you leave today  to schedule overnight oximetry> neg   modified barium swallow>MBS 10/29/2014  Diet Recommendations Dysphagia 1 (Puree);Thin liquid;Dysphagia 2  (Fine chop)  Liquid Administration via Cup  Medication Administration Crushed with puree  Compensations Slow rate;Small sips/bites;Multiple dry swallows  after each bite/sip  Postural Changes and/or Swallow Maneuvers Seated upright 90  degrees;Chin tuck Add automatically Dulera 100 Take 2 puffs first thing in am and then another 2 puffs about 12 hours later.  Plan B = backup  Only use your levoalbuterol  If needed  Cough into the flutter valve if having trouble  getting anything up       06/04/2015  f/u ov/Wert re:  Probable copd on spiriva and dulera 100 2bid Chief Complaint  Patient presents with  . Follow-up    Pt c/o increased SOB for the 3 wks. She states that she coughs more when she talks and produces clear sputum.  She has been using her o2 more, and xopenex 1-2 x per wk.   Uses 02 daytime prn  Never uses at hs  Not seeing neuro any more / "the only reason my muscles get worse is that my breathing gets worse" swallowing no change. Prednisone really helps breathing  rec Increase dulera to 200 Take 2 puffs first thing in am and then another 2 puffs about 12 hours later.  02 2lpm at bedtime and during the day goal is  to keep above 90% so ok to adjust up down or off to achieve that goal Prednisone 10 mg take  4 each am x 2 days,   2 each am x 2 days,  1 each am x 2 days and stop   07/12/15 requested pred rx p perfume exp     06/01/2017  f/u ov/Wert re:  Copd/ ab  Chief Complaint  Patient presents with  . Follow-up    Pt states her breathing is okay today. She states that she had trouble back in the summer. She feels like her baseline has changed. She increased her o2 to 2.5 lpm in Aug 2018 and this helped. She is using her xopenex inhaler 2 x per wk on average.   sleeping fine on 2.5 lpm s never noct xopenex but sleeping 90% hob elevation x years o/w immediate sense of smothering  Variably sob responds to saba. Needs pred cycles every 2.5 weeks x 6 days much better Continues to titrate 02 for sob, not sats   No obvious patters in terms of day to day or daytime variability or assoc excess/ purulent sputum or mucus plugs or hemoptysis or cp or chest tightness, subjective wheeze or overt sinus or hb symptoms. No unusual exposure hx or h/o childhood pna/ asthma or knowledge of premature birth.  Sleeping ok at 90 degrees  without nocturnal  or early am exacerbation  of respiratory  c/o's or need for noct saba. Also denies any obvious  fluctuation of symptoms with weather or environmental changes or other aggravating or alleviating factors except as outlined above   Current Allergies, Complete Past Medical History, Past Surgical History, Family History, and Social History were reviewed in [facility] electronic medical record.  ROS  The following are not active complaints unless bolded Hoarseness, sore throat, dysphagia, dental problems, itching, sneezing,  nasal congestion or discharge of excess mucus or purulent secretions, ear ache,   fever, chills, sweats, unintended wt loss or wt gain, classically  pleuritic or exertional cp,  orthopnea pnd or leg swelling, presyncope, palpitations, abdominal pain, anorexia, nausea, vomiting, diarrhea  or change in bowel habits or change in bladder habits, change in stools or change in urine, dysuria, hematuria,  rash, arthralgias, visual complaints, headache, numbness, weakness or ataxia or problems with walking or coordination,  change in mood/affect or memory.        Current Meds  Medication Sig  . bisoprolol (ZEBETA) 5 MG tablet Take 1 tablet (5 mg total) by mouth daily.  . cholecalciferol (VITAMIN D) 1000 UNITS tablet Take 2,000 Units by mouth daily.   . furosemide (LASIX) 20 MG tablet 2 tablets (40mg ) by mouth two times a day  . irbesartan (AVAPRO) 75 MG tablet Take 1 tablet (75 mg total) by mouth daily.  Marland Kitchen levalbuterol (XOPENEX HFA) 45 MCG/ACT inhaler Inhale up to 2 puffs every 4 hours as needed for shortness of breath or wheezing  . MAGNESIUM PO Take 1 tablet by mouth daily.  . mometasone-formoterol (DULERA) 200-5 MCG/ACT AERO Take 2 puffs first thing in am and then another 2 puffs about 12 hours later.  Marland Kitchen omeprazole (PRILOSEC) 40 MG capsule Take 40 mg by mouth 2 (two) times daily.  . OXYGEN 2.5 L continuous.   . potassium chloride (KLOR-CON 10) 10 MEQ tablet Take 2 tablets (20 mEq total) by mouth daily.  . Tiotropium Bromide Monohydrate (SPIRIVA RESPIMAT) 2.5 MCG/ACT AERS  Inhale 2 puffs into the lungs every morning.  . Zinc 30 MG CAPS Take 1 capsule by mouth daily.                                   Objective:   Physical Exam  Chronically ill thin w/c bound  anxious wf nad   Vital signs reviewed -  - Note on arrival 02 sats  98% on  2lpm        09/15/2013       113  > 10/27/2013   113 > 05/07/2014  111>  112 09/05/2014  > 10/25/2014  110 > 12/06/2014  110 > 03/07/2015  110> 06/04/2015  109 > 08/19/2015  105   > 11/18/2015    102 > 05/26/2016   96 > 06/01/2017    Est 100 by pt     08/17/13 113 lb (51.256 kg)  07/18/13 114 lb 6.4 oz (51.891 kg)  06/21/13 112 lb (50.803 kg)   LUNGS: no acc muscle use, clear to A and P    HEENT: nl dentition, turbinates bilaterally, and oropharynx. Nl external ear canals without cough reflex   NECK :  without JVD/Nodes/TM/ nl carotid upstrokes bilaterally   LUNGS: no acc muscle use,  Nl contour chest which is clear to A and P bilaterally without cough on insp or exp maneuvers   CV:  RRR  no s3 or murmur or increase in P2, and no edema   ABD:  soft and nontender with nl inspiratory excursion in the supine position. No bruits or organomegaly appreciated, bowel sounds nl  MS:   ext warm without deformities, calf tenderness, cyanosis or clubbing No obvious joint restrictions / diffuse muscle atrophy  SKIN: warm and dry without lesions    NEURO:  alert, approp, nl sensorium w/c bound                         Assessment & Plan:

## 2017-06-01 NOTE — Patient Instructions (Signed)
No change in medications    Please schedule a follow up visit in 6  months but call sooner if needed  

## 2017-06-02 ENCOUNTER — Encounter: Payer: Self-pay | Admitting: Internal Medicine

## 2017-06-02 NOTE — Assessment & Plan Note (Signed)
ACEi d/c 08/18/13 >> marked improvement  MBS 10/29/2014  >> Diet Recommendations Dysphagia 1 (Puree);Thin liquid;Dysphagia 2  - Flare with perfume exp 07/2015 - Flare with dust exp - see ov 08/19/2015   Not clear whether it's this vs copd/ab she's treating with prednisone but Adequate control on present rx, reviewed in detail with pt > no change in rx needed

## 2017-06-02 NOTE — Assessment & Plan Note (Addendum)
-   sp smoking cessation in 1989 - see f/v loops 08/01/13 c/w upper airway obstruction > refused to repeat off acei  - trial off acei 08/18/2013 > marked improvement - 09/15/2013   Walked RA x about 50 ft and stopped due to  Fatigue s sob or desat - Spiriva trial 09/15/2013 > improved 10/27/13 so continue - added flutter valve 09/05/2014  - 10/25/2014  added dulera 100 2bid based on reported improvement with prednisone> much better - requiring prn pred since 05/2015 s documentation it helps (refuses pfts)   06/01/2017  After extensive coaching HFA effectiveness =    75% (short Ti)   Despite lack of documentation she needs any  Inhalers or pred or even 02 she's doing well on her present regimen and refuses further w/u so nothing else to add at this point  Each maintenance medication was reviewed in detail including most importantly the difference between maintenance and as needed and under what circumstances the prns are to be used.  Please see AVS for specific  Instructions which are unique to this visit and I personally typed out  which were reviewed in detail in writing with the patient and a copy provided.

## 2017-06-14 ENCOUNTER — Encounter (HOSPITAL_COMMUNITY): Payer: 59 | Admitting: Cardiology

## 2017-08-03 ENCOUNTER — Ambulatory Visit (HOSPITAL_COMMUNITY)
Admission: RE | Admit: 2017-08-03 | Discharge: 2017-08-03 | Disposition: A | Discharge status: Home / Self Care | Payer: 59 | Source: Ambulatory Visit | Attending: Cardiology | Admitting: Cardiology

## 2017-08-03 ENCOUNTER — Encounter (HOSPITAL_COMMUNITY): Payer: Self-pay | Admitting: Cardiology

## 2017-08-03 ENCOUNTER — Other Ambulatory Visit: Payer: Self-pay

## 2017-08-03 VITALS — BP 92/52 | HR 82 | Wt 103.4 lb

## 2017-08-03 DIAGNOSIS — G709 Myoneural disorder, unspecified: Secondary | ICD-10-CM | POA: Diagnosis not present

## 2017-08-03 DIAGNOSIS — R0600 Dyspnea, unspecified: Secondary | ICD-10-CM | POA: Insufficient documentation

## 2017-08-03 DIAGNOSIS — I429 Cardiomyopathy, unspecified: Secondary | ICD-10-CM | POA: Insufficient documentation

## 2017-08-03 DIAGNOSIS — Z79899 Other long term (current) drug therapy: Secondary | ICD-10-CM | POA: Insufficient documentation

## 2017-08-03 DIAGNOSIS — I480 Paroxysmal atrial fibrillation: Secondary | ICD-10-CM

## 2017-08-03 DIAGNOSIS — R002 Palpitations: Secondary | ICD-10-CM

## 2017-08-03 DIAGNOSIS — I5022 Chronic systolic (congestive) heart failure: Secondary | ICD-10-CM

## 2017-08-03 DIAGNOSIS — I509 Heart failure, unspecified: Secondary | ICD-10-CM | POA: Insufficient documentation

## 2017-08-03 DIAGNOSIS — J449 Chronic obstructive pulmonary disease, unspecified: Secondary | ICD-10-CM | POA: Insufficient documentation

## 2017-08-03 DIAGNOSIS — K219 Gastro-esophageal reflux disease without esophagitis: Secondary | ICD-10-CM | POA: Insufficient documentation

## 2017-08-03 LAB — BASIC METABOLIC PANEL
Anion gap: 13 (ref 5–15)
BUN: 19 mg/dL (ref 6–20)
CO2: 24 mmol/L (ref 22–32)
Calcium: 9.3 mg/dL (ref 8.9–10.3)
Chloride: 100 mmol/L — ABNORMAL LOW (ref 101–111)
Creatinine, Ser: 0.56 mg/dL (ref 0.44–1.00)
GFR calc Af Amer: >60 mL/min (ref >60)
GFR calc non Af Amer: >60 mL/min (ref >60)
Glucose, Bld: 131 mg/dL — ABNORMAL HIGH (ref 65–99)
Potassium: 3.7 mmol/L (ref 3.5–5.1)
Sodium: 137 mmol/L (ref 135–145)

## 2017-08-03 NOTE — Progress Notes (Signed)
Patient ID: [name], female   DOB: 03/22/1961, 57 y.o.   MRN: [id number] PCP: Dr. Corliss Blacker Cardiology: Dr. Shirlee Latch  56 y.o. with complicated past medical history presents for evaluation of her cardiomyopathy.  She has a long history of neuromuscular disorder.  The exact diagnosis remains undetermined.  She is confined to a wheelchair.  In 12/13, she was admitted to [facility] with hypotension, hypothermia, and altered mental status.   She was found to have profound anemia with hemoglobin down to 3.  She was transfused 4 units.  She also was found to have Enterococcal UTI and Serratia PNA.  She was intubated and eventually required tracheostomy.  During her hospitalization, she was found to have a cardiomyopathy with EF 20-25% by echo.  She was treated with milrinone.  Repeat echo in 5/14 showed EF improved to 50-55% with abnormal septal motion and last echo in 9/14 showed EF 55-60%.    Previously lasix was increased to 40 mg in am and 20 mg in pm along with 20 meq of potassium. I offered her a RHC however she declined. PFTs obtained as noted below and showed severe airway obstruction with subsequent evaluation by Dr [name].  At that time Dr [name] switched lisinopril to irbesartan. He felt that despite severity of her COPD, main limitation was her neuromuscular condition. Event monitor was worn in 1/15 due to tachypalpitations.  This showed PACs.  Event monitor was worn again in 12/17, this again showed PACs with short runs of atrial tachycardia.   She returns for followup of CHF and palpitations.  She continues to have periodic exacerbations of COPD/asthma for which she is given steroids.  She wears home oxygen.  Last echo in 7/18 was normal.  She is currently taking Lasix 40 mg bid (but does not always take the evening dose).  Still poor strength in her legs, uses wheelchair for long distances, walks with walker in house.  Dyspnea comes and goes, seems to be associated with wheezing/asthma flaring.  She has  had a couple of recent long episodes of tachypalpitations in the setting of an asthma exacerbation and Xopenex use.  No lightheadedness or syncope.   Labs (1/14): K 3.9, creatinine 0.21, HCT 30 Labs (4/14): K 4, creatinine 0.4, BNP 19 Labs (10/14): K 3.5, creatinine 0.5 Labs (12/14): K 3.6, creatinine 0.4 Labs (07/28/12): K 3.5 creatinine 0.5 Labs (08/01/13): K 3.5 Creatinine 0.5 Pro BNP 129 Labs (2/16): K 3.8, creatinine 0.44 Labs (9/16): K 3.6, creatinine 0.52, BNP 14 Labs (11/17): K 4.6, creatinine 0.53, BNP 16 Labs (5/18): K 3.7, creatinine 0.53  PMH: 1. Anorexia/cachexia/failure to thrive: Patient had a PEG tube.  2. Neuromuscular disease: exact diagnosis is still undetermined despite extensive neurology evaluation.  She does not have multiple sclerosis.  3. Fibromyalgia 4. IBS 5. TMJ syndrome 6. PTSD 7. GERD 8. Sacral decubitus ulcer 9. H/o aspiration 10. 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  Echo (12/13) with EF 20-25% with diffuse hypokinesis, mild MR, moderate RV dilation and moderate RV dysfunction.  Echo (5/14) with EF 50-55%, abnormal septal motion.  Echo (9/14) with EF 55-60%.   - Echo (5/17): EF 60-65%.  - Echo (7/18): EF 60-65%, normal RV.  11. Anemia 12. COPD/asthma: PFTs 08/01/13 with FEV1 0.98 liters (33%), FVC 1.87 (50%), FEV 1/FVC 52%, FEF 25-75% 0.65 liter.  - She is on home oxygen.  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 Palpitations: Event monitor in  12/17 showed PACs and short runs of atrial tachycardia.   SH: Lives with husband in [place].  Quit smoking > 20 years ago.  No ETOH.     FH: No history of cardiomyopathy.  Grandfather had MI at 15.   ROS: All systems reviewed and negative except as per HPI.    Current Outpatient Medications  Medication Sig Dispense Refill  . bisoprolol (ZEBETA) 5 MG tablet TAKE 1 TABLET ONCE DAILY. 30 tablet 3  .  cholecalciferol (VITAMIN D) 1000 UNITS tablet Take 2,000 Units by mouth daily.     . furosemide (LASIX) 20 MG tablet TAKE (2) TABLETS TWICE DAILY. 120 tablet 3  . irbesartan (AVAPRO) 75 MG tablet TAKE 1 TABLET ONCE DAILY. 30 tablet 3  . levalbuterol (XOPENEX HFA) 45 MCG/ACT inhaler Inhale up to 2 puffs every 4 hours as needed for shortness of breath or wheezing 1 Inhaler 5  . MAGNESIUM PO Take 1 tablet by mouth daily.    . mometasone-formoterol (DULERA) 200-5 MCG/ACT AERO Take 2 puffs first thing in am and then another 2 puffs about 12 hours later. 1 Inhaler 6  . omeprazole (PRILOSEC) 40 MG capsule Take 40 mg by mouth 2 (two) times daily.    . OXYGEN 2.5 L continuous.     . potassium chloride (KLOR-CON 10) 10 MEQ tablet Take 2 tablets (20 mEq total) by mouth daily. 60 tablet 11  . predniSONE (DELTASONE) 10 MG tablet 40mg X2 days, 20mg  X2 days, 10mg X2 days, then stop. 14 tablet 11  . Tiotropium Bromide Monohydrate (SPIRIVA RESPIMAT) 2.5 MCG/ACT AERS Inhale 2 puffs into the lungs every morning. 1 Inhaler 6  . Zinc 30 MG CAPS Take 1 capsule by mouth daily.      No current facility-administered medications for this encounter.     BP (!) 92/52   Pulse 82   Wt 103 lb 6 oz (46.9 kg)   SpO2 99% Comment: on 2.5L of O2  BMI 18.31 kg/m  General: NAD, thin Neck: No JVD, no thyromegaly or thyroid nodule.  Lungs: Clear to auscultation bilaterally with normal respiratory effort. CV: Nondisplaced PMI.  Heart regular S1/S2, no S3/S4, no murmur.  No peripheral edema.  No carotid bruit.  Normal pedal pulses.  Abdomen: Soft, nontender, no hepatosplenomegaly, no distention.  Skin: Intact without lesions or rashes.  Neurologic: Alert and oriented x 3.  Psych: Normal affect. Extremities: No clubbing or cyanosis.  HEENT: Normal.   Assessment/Plan: 1. Cardiomyopathy: Last echo in 7/18 was normal.  I suspect that her prior low EF was due to a stress (Takotsubo-type) cardiomyopathy.  She is not volume  overloaded on exam, do not think that dyspnea is due to CHF.   - She will continue bisoprolol, irbesartan, and Lasix. I don't think she needs to take Lasix more than once a day.  She did not tolerate coming off irbesartan in past.   - BMET today.  2. Dyspnea: NYHA class III symptoms, chronic. Suspect combination of COPD/asthma and deconditioning due to neuromuscular disorder.  As above, doubt CHF.  She is followed closely by Dr [name]. She is wearing oxygen now at all times.  She has periodic flares where she will take prednisone.  3. Neuromuscular disorder: Not fully characterized.  She is in a wheelchair for longer distances but can walk shorter distances with a walker. I have offered to refer her back to neurology in the past but she declined.  4. Palpitations: PACs + short runs atrial tachycardia on event monitor  in 12/17.  She had a couple of long runs of palpitations recently in the setting of an asthma flare.   - 30 days event monitor: assess for atrial fibrillation as a cause of palpitations.   Followup 6 months.    Marqual Mi,MD 08/03/2017

## 2017-08-03 NOTE — Patient Instructions (Signed)
Labs drawn today (if we do not call you, then your lab work was stable)   Your physician has recommended that you wear an event monitor. Event monitors are medical devices that record the heart's electrical activity. Doctors most often [place] these monitors to diagnose arrhythmias. Arrhythmias are problems with the speed or rhythm of the heartbeat. The monitor is a small, portable device. You can wear one while you do your normal daily activities. This is usually used to diagnose what is causing palpitations/syncope (passing out).  Your physician recommends that you schedule a follow-up appointment in: 6 months with Dr. Shirlee Latch  (we will call you)

## 2017-08-04 ENCOUNTER — Other Ambulatory Visit (HOSPITAL_COMMUNITY): Payer: Self-pay | Admitting: *Deleted

## 2017-08-04 DIAGNOSIS — R600 Localized edema: Secondary | ICD-10-CM

## 2017-08-04 DIAGNOSIS — R0602 Shortness of breath: Secondary | ICD-10-CM

## 2017-08-04 DIAGNOSIS — R1900 Intra-abdominal and pelvic swelling, mass and lump, unspecified site: Secondary | ICD-10-CM

## 2017-08-04 DIAGNOSIS — I5022 Chronic systolic (congestive) heart failure: Secondary | ICD-10-CM

## 2017-08-04 MED ORDER — FUROSEMIDE 20 MG PO TABS: ORAL_TABLET | ORAL | 6 refills | Status: DC

## 2017-08-04 MED ORDER — BISOPROLOL FUMARATE 5 MG PO TABS: 5.0000 mg | ORAL_TABLET | Freq: Every day | ORAL | 6 refills | Status: DC

## 2017-08-04 MED ORDER — IRBESARTAN 75 MG PO TABS: 75.0000 mg | ORAL_TABLET | Freq: Every day | ORAL | 6 refills | Status: DC

## 2017-08-04 MED ORDER — POTASSIUM CHLORIDE ER 10 MEQ PO TBCR: 20.0000 meq | EXTENDED_RELEASE_TABLET | Freq: Every day | ORAL | 6 refills | Status: DC

## 2017-08-13 ENCOUNTER — Ambulatory Visit (INDEPENDENT_AMBULATORY_CARE_PROVIDER_SITE_OTHER): Payer: 59

## 2017-08-13 DIAGNOSIS — I480 Paroxysmal atrial fibrillation: Secondary | ICD-10-CM

## 2017-11-11 ENCOUNTER — Other Ambulatory Visit: Payer: Self-pay | Admitting: Internal Medicine

## 2017-11-30 ENCOUNTER — Encounter: Payer: Self-pay | Admitting: Internal Medicine

## 2017-11-30 ENCOUNTER — Ambulatory Visit (INDEPENDENT_AMBULATORY_CARE_PROVIDER_SITE_OTHER): Payer: 59 | Admitting: Internal Medicine

## 2017-11-30 VITALS — BP 112/62 | HR 104 | Ht 63.0 in | Wt 104.0 lb

## 2017-11-30 DIAGNOSIS — J449 Chronic obstructive pulmonary disease, unspecified: Secondary | ICD-10-CM

## 2017-11-30 MED ORDER — MOMETASONE FURO-FORMOTEROL FUM 200-5 MCG/ACT IN AERO: INHALATION_SPRAY | RESPIRATORY_TRACT | 11 refills | Status: DC

## 2017-11-30 MED ORDER — LEVALBUTEROL TARTRATE 45 MCG/ACT IN AERO
INHALATION_SPRAY | RESPIRATORY_TRACT | 5 refills | Status: DC
Start: 2017-11-30 — End: 2019-05-12

## 2017-11-30 MED ORDER — PREDNISONE 10 MG PO TABS: ORAL_TABLET | ORAL | 11 refills | Status: DC

## 2017-11-30 MED ORDER — TIOTROPIUM BROMIDE MONOHYDRATE 2.5 MCG/ACT IN AERS
INHALATION_SPRAY | RESPIRATORY_TRACT | 11 refills | Status: DC
Start: 2017-11-30 — End: 2018-09-07

## 2017-11-30 MED ORDER — OMEPRAZOLE 40 MG PO CPDR: 40.0000 mg | DELAYED_RELEASE_CAPSULE | Freq: Two times a day (BID) | ORAL | 11 refills | Status: DC

## 2017-11-30 NOTE — Progress Notes (Signed)
Subjective:    Patient ID: [name], female    DOB: 1961/06/10   MRN: [id number]    Brief patient profile:  56 yowf quit smoking 1989 then ? MS dx'd early 13's by D[facility]ir (though pt states flatly she doesn't have MS)  and "it's been steadily downhill since" mostly muscle related but since 2012 also with breathing difficulty > admit:   Admit date: 06/06/2012  Discharge date: 06/20/2012  Discharge Diagnoses:  Acute respiratory failure  CAP (community acquired pneumonia) vs aspiration pneumonia  Acute encephalopathy, resolved  Neuromuscular disorder: not fully defined.  Anemia  Thrombocytopenia  Coagulopathy  Acute liver failure  Enterococcal UTI (pansensitive)  Shock circulatory, suspect primarily cardiogenic  Sacral Pressure Ulcer  Dilated cardiomyopathy  Malnutrition compromising bodily function  H/O anorexia nervosa  Dysphagia    History of Present Illness  08/17/2013 1st  Pulmonary office visit/ [name] was able do some adl/s with walker and no 02 summer of 2014  then downhill since  And now sob at rest  X 2 weeks and able to lie down at 45 degrees and can't tol lying flat at all for sev weeks assoc with dry cough day and night and dysphagia/ choking sensation on acei.  Already on gerd rx/ inhaler no benefit.  rec Stop lisinopril Avapro 75 mg one daily  Prilosec 20 mg Take 30- 60 min before your first and last meals of the day  GERD diet     09/15/2013 f/u ov/[name] re: acei vs copd  Chief Complaint  Patient presents with  . Follow-up    Pt states that her SOB/cough has improved greatly.  Pt still c/o SOB but is much better since lv.    limited more by fatigue and weakness than sob, swallowing fine now  rec spiriva 2puffs each am to see if your activity tolerance improves> improved so maint rx    09/05/2014 acute ov/[name] re: worse sob/increase need for saba on spiriva maint rx  Chief Complaint  Patient presents with  . Acute Visit    Pt c/o increased  SOB- started mid Nov 2015, worse for the past month. She is using xopenex 2-3 x per day on average. She c/o cough for the past month- sometimes prod with clear yellow/green sputum. She has had some occ bloody nasal d/c.  Has not been able to lie down for several years, sitting upright in hosp bed due to sob Swallowing worse "because cough is worse" despite  on prilosec 20 bid with meals  Denies choking but all foods are pureed. rec Change omeprazole to where you take 20mg  x 2 Take 30- 60 min before your first and last meals of the day  GERD   Try flutter valve as needed for cough or congestion > ok to cough into the valve if helps    10/25/2014 f/u ov/[name] re: mild copd/ MS with chronic dysphagia/ off pred x 21 days prior to OV   Chief Complaint  Patient presents with  . Acute Visit    Pt c/o decreased o2 sats since 09/26/14- feels fatigued and more SOB. She states that she is also aspirating more and got choked drinking water recently.   while on prednisone did not use any xopenex hfa but prior was using it up 3 x daily  On prednisone cough also goes away  But worse in am's without it. Thinks she may be aspirating more since Nov 2015 >   Convinced her copd is progressing based on her  readings Mucus is minimal, no food in it rec Please see patient coordinator before you leave today  to schedule overnight oximetry> neg   modified barium swallow>MBS 10/29/2014  Diet Recommendations Dysphagia 1 (Puree);Thin liquid;Dysphagia 2  (Fine chop)  Liquid Administration via Cup  Medication Administration Crushed with puree  Compensations Slow rate;Small sips/bites;Multiple dry swallows  after each bite/sip  Postural Changes and/or Swallow Maneuvers Seated upright 90  degrees;Chin tuck Add automatically Dulera 100 Take 2 puffs first thing in am and then another 2 puffs about 12 hours later.  Plan B = backup  Only use your levoalbuterol  If needed  Cough into the flutter valve if having trouble  getting anything up       06/04/2015  f/u ov/[name] re:  Probable copd on spiriva and dulera 100 2bid Chief Complaint  Patient presents with  . Follow-up    Pt c/o increased SOB for the 3 wks. She states that she coughs more when she talks and produces clear sputum.  She has been using her o2 more, and xopenex 1-2 x per wk.   Uses 02 daytime prn  Never uses at hs  Not seeing neuro any more / "the only reason my muscles get worse is that my breathing gets worse" swallowing no change. Prednisone really helps breathing  rec Increase dulera to 200 Take 2 puffs first thing in am and then another 2 puffs about 12 hours later.  02 2lpm at bedtime and during the day goal is  to keep above 90% so ok to adjust up down or off to achieve that goal Prednisone 10 mg take  4 each am x 2 days,   2 each am x 2 days,  1 each am x 2 days and stop   07/12/15 requested pred rx p perfume exp     06/01/2017  f/u ov/[name] re:  Copd/ ab  Chief Complaint  Patient presents with  . Follow-up    Pt states her breathing is okay today. She states that she had trouble back in the summer. She feels like her baseline has changed. She increased her o2 to 2.5 lpm in Aug 2018 and this helped. She is using her xopenex inhaler 2 x per wk on average.   sleeping fine on 2.5 lpm s never noct xopenex but sleeping 90% hob elevation x years o/w immediate sense of smothering  Variably sob responds to saba. Needs pred cycles every 2.5 weeks x 6 days much better Continues to titrate 02 for sob, not sats  rec No change rx   11/30/2017  f/u ov/[name] re: copd/ab  Chief Complaint  Patient presents with  . Follow-up    Breathing has been worse recently- relates to humid weather. She is coughing up yellow to green sputum. She is using her xopenex inhaler 2 x per wk on average.   Dyspnea:  2.5  lpm walking 50 ft which is improved over baseline  Cough: variably dark  Sleep:  90 degrees hosp bed otherwise can't breathe comfortably even  on pred  SABA use: as above    Last pred was finished one week prior to OV     No obvious day to day or daytime variability or assoc excess/ purulent sputum or mucus plugs or hemoptysis or cp or chest tightness, subjective wheeze or overt sinus or hb symptoms. No unusual exposure hx or h/o childhood pna/ asthma or knowledge of premature birth.  Sleeping  As above   without nocturnal  or  early am exacerbation  of respiratory  c/o's or need for noct saba. Also denies any obvious fluctuation of symptoms with weather or environmental changes or other aggravating or alleviating factors except as outlined above   Current Allergies, Complete Past Medical History, Past Surgical History, Family History, and Social History were reviewed in [employer] record.  ROS  The following are not active complaints unless bolded Hoarseness, sore throat, dysphagia no change from previous x years or prior ST eval  , dental problems, itching, sneezing,  nasal congestion or discharge of excess mucus or purulent secretions, ear ache,   fever, chills, sweats, unintended wt loss or wt gain, classically pleuritic or exertional cp,  orthopnea pnd or arm/hand swelling  or leg swelling, presyncope, palpitations, abdominal pain, anorexia, nausea, vomiting, diarrhea  or change in bowel habits or change in bladder habits, change in stools or change in urine, dysuria, hematuria,  rash, arthralgias, visual complaints, headache, numbness, weakness or ataxia or problems with walking or coordination,  change in mood or  memory.        Current Meds  Medication Sig  . bisoprolol (ZEBETA) 5 MG tablet Take 1 tablet (5 mg total) by mouth daily.  . cholecalciferol (VITAMIN D) 1000 UNITS tablet Take 2,000 Units by mouth daily.   . furosemide (LASIX) 20 MG tablet TAKE (2) TABLETS TWICE DAILY.  Marland Kitchen irbesartan (AVAPRO) 75 MG tablet Take 1 tablet (75 mg total) by mouth daily.  Marland Kitchen levalbuterol (XOPENEX HFA) 45 MCG/ACT inhaler  Inhale up to 2 puffs every 4 hours as needed for shortness of breath or wheezing  . MAGNESIUM PO Take 1 tablet by mouth daily.  . mometasone-formoterol (DULERA) 200-5 MCG/ACT AERO Take 2 puffs first thing in am and then another 2 puffs about 12 hours later.  Marland Kitchen omeprazole (PRILOSEC) 40 MG capsule Take 1 capsule (40 mg total) by mouth 2 (two) times daily.  . OXYGEN 2.5 L continuous.   . potassium chloride (KLOR-CON 10) 10 MEQ tablet Take 2 tablets (20 mEq total) by mouth daily.  . Tiotropium Bromide Monohydrate (SPIRIVA RESPIMAT) 2.5 MCG/ACT AERS USE 2 PUFFS EVERY MORNING.  Marland Kitchen Zinc 30 MG CAPS Take 1 capsule by mouth daily.   .     .    .    .                          Objective:   Physical Exam   amb wf nad       Vital signs reviewed - Note on arrival 02 sats  99% on 2.5lpm      09/15/2013       113  > 10/27/2013   113 > 05/07/2014  111>  112 09/05/2014  > 10/25/2014  110 > 12/06/2014  110 > 03/07/2015  110> 06/04/2015  109 > 08/19/2015  105   > 11/18/2015    102 > 05/26/2016   96 >  11/30/2017  104     08/17/13 113 lb (51.256 kg)  07/18/13 114 lb 6.4 oz (51.891 kg)  06/21/13 112 lb (50.803 kg)      No jvd Oropharynx clear as can tell but can only open mouth slightly Neck supple Lungs clear bilaterally to A and P  RRR no s3 or or sign murmur Abd soft/ non tender/ ok excursion on isp Ext wam with no edema or clubbing noted/ muscle wasting noted  Neuro  Alert/ No motor deficits  Apparent  other than inability to open mouth widely                      Assessment & Plan:

## 2017-11-30 NOTE — Patient Instructions (Addendum)
Omeprazole 40 Take 30- 60 min before your first and last meals of the day to take instead of the over the counter version  No change in respiratory medications   Please schedule a follow up visit in 6 months but call sooner if needed

## 2017-12-01 ENCOUNTER — Encounter: Payer: Self-pay | Admitting: Internal Medicine

## 2017-12-01 NOTE — Assessment & Plan Note (Signed)
-   sp smoking cessation in 1989 - see f/v loops 08/01/13 c/w upper airway obstruction > refused to repeat off acei  - trial off acei 08/18/2013 > marked improvement - 09/15/2013   Walked RA x about 50 ft and stopped due to  Fatigue s sob or desat - Spiriva trial 09/15/2013 > improved 10/27/13 so continue - added flutter valve 09/05/2014  - 10/25/2014  added dulera 100 2bid based on reported improvement with prednisone> much better - requiring prn pred since 05/2015 s documentation it helps (refuses pfts)    Discussed in detail all the  indications, usual  risks and alternatives  relative to the benefits with patient who agrees to proceed with conservative f/u as in past - not convinced there's much copd or 02 or steroid or saba need here but declines further testing and is actually making slow progress over baseline so no changes rec    I had an extended discussion with the patient reviewing all relevant studies completed to date and  lasting 15 to 20 minutes of a 25 minute visit      device teaching   extended face to face time for this visit.  Each maintenance medication was reviewed in detail including emphasizing most importantly the difference between maintenance and prns and under what circumstances the prns are to be triggered using an action plan format that is not reflected in the computer generated alphabetically organized AVS which I have not found useful in most complex patients, especially with respiratory illnesses  Please see AVS for specific instructions unique to this visit that I personally wrote and verbalized to the the pt in detail and then reviewed with pt  by my nurse highlighting any  changes in therapy recommended at today's visit to their plan of care.      F/u q 6 m

## 2017-12-14 ENCOUNTER — Telehealth: Payer: Self-pay | Admitting: Internal Medicine

## 2017-12-14 NOTE — Telephone Encounter (Signed)
Called patient, unable to reach left message to give us a call back. 

## 2017-12-15 NOTE — Telephone Encounter (Signed)
Called and spoke with pt to let her know there is a savings card that we could give her for the spiriva 2.5  Pt stated to me she had found the same thing but it was only to cover $100 of the $500 of the med.  Pt stated [employer] would cover paying for the med every year if a phone call was made by Dr. [name] stating that pt needed to continue on the current med due to it helping with her breathing.  Pt states insurance will not cut off covering the Spriva 2.5 until January 03, 2018 so the phone call to [employer] needs to be done before then.  The phone number is [phone] for [employer] PA phone number.  Pt states she has not taken any other meds other than the Spiriva Respimat but due to the Spiriva Respimat helping her and with her being so sensitive to other meds, she wants to stay on this med if she is able to.  Pt has been made aware that MW is out of the office until next week and she stated she is fine with that and for [place] to get back to her with a response as soon as we can.  Dr. [name], please advise on this.  If we do a PA, I am not sure the outcome due to pt not being on any other inhalers than the Spiriva 2.5  Pt did say the inhalers that will be covered by pt's insurance after 01/03/18 are Incruse and [place].

## 2017-12-16 NOTE — Telephone Encounter (Signed)
Attempted to contact pt's insurance company at [phone]. I received a message that the customer service was attending a meeting and to call back on the next business day.

## 2017-12-16 NOTE — Telephone Encounter (Signed)
Go ahead and start the PA approval process and call to verify what she said is correct because I strongly doubt they said they told her they would pay for it with a call from me but if you can verify that at the number she gave then I can call them today and we can always provide a sample to buy her some time.  Also could see if stiolto is convered and if it is offer the combo of stiolto (which is spiriva plus laba) and asmanex which is the dulera minus the laba) and get the same exact same meds.

## 2017-12-22 NOTE — Telephone Encounter (Signed)
Attempted to initiate PA via [email or web address], per [email or web address]: -------------------  Information regarding your request  Drug is covered by current benefit plan. No further PA activity needed. -------------------- We cannot preemptively do a PA when she currently has coverage on the medication.  Will need to hold until 01/03/18 when coverage is no longer available to initiate PA.  lmtcb for pt to make aware of this.

## 2017-12-23 NOTE — Telephone Encounter (Signed)
Pt is aware of below message and voiced her understanding. Pt stated she will call back after 01/03/18 to start PA, as spiriva will not be covered after 01/03/18. Nothing further is needed.

## 2018-01-03 ENCOUNTER — Telehealth: Payer: Self-pay | Admitting: Internal Medicine

## 2018-01-03 NOTE — Telephone Encounter (Signed)
Spoke with pt. She is aware that this has been approved. Pt has already contacted [facility]. Nothing further was needed.

## 2018-01-03 NOTE — Telephone Encounter (Signed)
Patient returned call, CB is [phone]

## 2018-01-03 NOTE — Telephone Encounter (Signed)
I have completed PA on [employer]. PA has been approved.  Attempted to call pt. I did not receive an answer. I have left a message for pt to return our call.

## 2018-02-04 ENCOUNTER — Ambulatory Visit (HOSPITAL_COMMUNITY)
Admission: RE | Admit: 2018-02-04 | Discharge: 2018-02-04 | Disposition: A | Discharge status: Home / Self Care | Payer: 59 | Source: Ambulatory Visit | Attending: Cardiology | Admitting: Cardiology

## 2018-02-04 VITALS — BP 102/48 | HR 85 | Wt 110.0 lb

## 2018-02-04 DIAGNOSIS — R600 Localized edema: Secondary | ICD-10-CM

## 2018-02-04 DIAGNOSIS — I509 Heart failure, unspecified: Secondary | ICD-10-CM | POA: Insufficient documentation

## 2018-02-04 DIAGNOSIS — Z7952 Long term (current) use of systemic steroids: Secondary | ICD-10-CM | POA: Insufficient documentation

## 2018-02-04 DIAGNOSIS — J449 Chronic obstructive pulmonary disease, unspecified: Secondary | ICD-10-CM | POA: Diagnosis not present

## 2018-02-04 DIAGNOSIS — R002 Palpitations: Secondary | ICD-10-CM | POA: Insufficient documentation

## 2018-02-04 DIAGNOSIS — K219 Gastro-esophageal reflux disease without esophagitis: Secondary | ICD-10-CM | POA: Diagnosis not present

## 2018-02-04 DIAGNOSIS — I471 Supraventricular tachycardia: Secondary | ICD-10-CM | POA: Diagnosis not present

## 2018-02-04 DIAGNOSIS — I429 Cardiomyopathy, unspecified: Secondary | ICD-10-CM | POA: Diagnosis not present

## 2018-02-04 DIAGNOSIS — R1900 Intra-abdominal and pelvic swelling, mass and lump, unspecified site: Secondary | ICD-10-CM

## 2018-02-04 DIAGNOSIS — I5022 Chronic systolic (congestive) heart failure: Secondary | ICD-10-CM

## 2018-02-04 DIAGNOSIS — R0600 Dyspnea, unspecified: Secondary | ICD-10-CM | POA: Insufficient documentation

## 2018-02-04 DIAGNOSIS — Z79899 Other long term (current) drug therapy: Secondary | ICD-10-CM | POA: Insufficient documentation

## 2018-02-04 DIAGNOSIS — M797 Fibromyalgia: Secondary | ICD-10-CM | POA: Diagnosis not present

## 2018-02-04 DIAGNOSIS — Z9981 Dependence on supplemental oxygen: Secondary | ICD-10-CM | POA: Diagnosis not present

## 2018-02-04 DIAGNOSIS — G709 Myoneural disorder, unspecified: Secondary | ICD-10-CM | POA: Diagnosis not present

## 2018-02-04 DIAGNOSIS — Z09 Encounter for follow-up examination after completed treatment for conditions other than malignant neoplasm: Secondary | ICD-10-CM | POA: Diagnosis not present

## 2018-02-04 DIAGNOSIS — Z7951 Long term (current) use of inhaled steroids: Secondary | ICD-10-CM | POA: Diagnosis not present

## 2018-02-04 DIAGNOSIS — R0602 Shortness of breath: Secondary | ICD-10-CM

## 2018-02-04 LAB — BASIC METABOLIC PANEL
Anion gap: 9 (ref 5–15)
BUN: 18 mg/dL (ref 6–20)
CO2: 26 mmol/L (ref 22–32)
Calcium: 9.2 mg/dL (ref 8.9–10.3)
Chloride: 105 mmol/L (ref 98–111)
Creatinine, Ser: 0.51 mg/dL (ref 0.44–1.00)
GFR calc Af Amer: >60 mL/min (ref >60)
GFR calc non Af Amer: >60 mL/min (ref >60)
Glucose, Bld: 124 mg/dL — ABNORMAL HIGH (ref 70–99)
Potassium: 3.7 mmol/L (ref 3.5–5.1)
Sodium: 140 mmol/L (ref 135–145)

## 2018-02-04 LAB — MAGNESIUM: Magnesium: 2.2 mg/dL (ref 1.7–2.4)

## 2018-02-04 MED ORDER — FUROSEMIDE 20 MG PO TABS: ORAL_TABLET | ORAL | 11 refills | Status: DC

## 2018-02-04 MED ORDER — BISOPROLOL FUMARATE 5 MG PO TABS: 5.0000 mg | ORAL_TABLET | Freq: Every day | ORAL | 11 refills | Status: DC

## 2018-02-04 MED ORDER — IRBESARTAN 75 MG PO TABS: 75.0000 mg | ORAL_TABLET | Freq: Every day | ORAL | 11 refills | Status: DC

## 2018-02-04 MED ORDER — POTASSIUM CHLORIDE ER 10 MEQ PO TBCR: 20.0000 meq | EXTENDED_RELEASE_TABLET | Freq: Every day | ORAL | 11 refills | Status: DC

## 2018-02-04 NOTE — Patient Instructions (Signed)
Routine lab work today. Will notify you of abnormal results, otherwise no news is good news!  No changes to medication at this time.  Follow up with Dr. Shirlee Latch in 6 months. We will call you closer to this time, or you may call our office to schedule 1 month before you are due to be seen. Take all medication as prescribed the day of your appointment. Bring all medications with you to your appointment.  Do the following things EVERYDAY: 1) Weigh yourself in the morning before breakfast. Write it down and keep it in a log. 2) Take your medicines as prescribed 3) Eat low salt foods-Limit salt (sodium) to 2000 mg per day.  4) Stay as active as you can everyday 5) Limit all fluids for the day to less than 2 liters

## 2018-02-06 NOTE — Progress Notes (Signed)
Patient ID: [name], female   DOB: 06-19-1961, 57 y.o.   MRN: [id number] PCP: Dr. Corliss Blacker Cardiology: Dr. Shirlee Latch  56 y.o. with complicated past medical history presents for evaluation of her cardiomyopathy.  She has a long history of neuromuscular disorder.  The exact diagnosis remains undetermined.  She is confined to a wheelchair.  In 12/13, she was admitted to [facility] with hypotension, hypothermia, and altered mental status.   She was found to have profound anemia with hemoglobin down to 3.  She was transfused 4 units.  She also was found to have Enterococcal UTI and Serratia PNA.  She was intubated and eventually required tracheostomy.  During her hospitalization, she was found to have a cardiomyopathy with EF 20-25% by echo.  She was treated with milrinone.  Repeat echo in 5/14 showed EF improved to 50-55% with abnormal septal motion and last echo in 9/14 showed EF 55-60%.    Previously lasix was increased to 40 mg in am and 20 mg in pm along with 20 meq of potassium. I offered her a RHC however she declined. PFTs obtained as noted below and showed severe airway obstruction with subsequent evaluation by Dr [name].  At that time Dr [name] switched lisinopril to irbesartan. He felt that despite severity of her COPD, main limitation was her neuromuscular condition. Event monitor was worn in 1/15 due to tachypalpitations.  This showed PACs.  Event monitor was worn again in 12/17, this again showed PACs with short runs of atrial tachycardia.   She returns for followup of CHF and palpitations.  She continues to have periodic exacerbations of COPD/asthma for which she is given steroids.  She wears home oxygen.  Last echo in 7/18 was normal.  She wore an event monitor in 2/19 because of palpitations and had to take it off because of allergic reaction, no significant arrhythmias in the short period that she wore the monitor.  She says that she is walking more with her walker.  She occasionally has leg  swelling, comes and goes.  Breathing is "up and down."  She takes Lasix 40 mg daily, occasionally takes 40 mg in the afternoon.  Still poor strength in her legs, uses wheelchair for long distances.  No lightheadedness or syncope.   Labs (1/14): K 3.9, creatinine 0.21, HCT 30 Labs (4/14): K 4, creatinine 0.4, BNP 19 Labs (10/14): K 3.5, creatinine 0.5 Labs (12/14): K 3.6, creatinine 0.4 Labs (07/28/12): K 3.5 creatinine 0.5 Labs (08/01/13): K 3.5 Creatinine 0.5 Pro BNP 129 Labs (2/16): K 3.8, creatinine 0.44 Labs (9/16): K 3.6, creatinine 0.52, BNP 14 Labs (11/17): K 4.6, creatinine 0.53, BNP 16 Labs (5/18): K 3.7, creatinine 0.53 Labs (1/19): K 3.7, creatinine 0.56  ECG (personally reviewed): NSR, normal  PMH: 1. Anorexia/cachexia/failure to thrive: Patient had a PEG tube.  2. Neuromuscular disease: exact diagnosis is still undetermined despite extensive neurology evaluation.  She does not have multiple sclerosis.  3. Fibromyalgia 4. IBS 5. TMJ syndrome 6. PTSD 7. GERD 8. Sacral decubitus ulcer 9. H/o aspiration 10. 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  Echo (12/13) with EF 20-25% with diffuse hypokinesis, mild MR, moderate RV dilation and moderate RV dysfunction.  Echo (5/14) with EF 50-55%, abnormal septal motion.  Echo (9/14) with EF 55-60%.   - Echo (5/17): EF 60-65%.  - Echo (7/18): EF 60-65%, normal RV.  11. Anemia 12. COPD/asthma: PFTs 08/01/13 with FEV1 0.98 liters (33%), FVC 1.87 (50%), FEV 1/FVC 52%,  FEF 25-75% 0.65 liter.  - She is on home oxygen.  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 Palpitations: Event monitor in 12/17 showed PACs and short runs of atrial tachycardia.  - 2/19 event monitor: She only wore it for a few days.  No significant arrhythmias.   SH: Lives with husband in [place].  Quit smoking > 20 years ago.  No ETOH.     FH: No history of  cardiomyopathy.  Grandfather had MI at 69.   ROS: All systems reviewed and negative except as per HPI.    Current Outpatient Medications  Medication Sig Dispense Refill  . bisoprolol (ZEBETA) 5 MG tablet Take 1 tablet (5 mg total) by mouth daily. 30 tablet 11  . cholecalciferol (VITAMIN D) 1000 UNITS tablet Take 2,000 Units by mouth daily.     . furosemide (LASIX) 20 MG tablet TAKE (2) TABLETS TWICE DAILY. 120 tablet 11  . irbesartan (AVAPRO) 75 MG tablet Take 1 tablet (75 mg total) by mouth daily. 30 tablet 11  . levalbuterol (XOPENEX HFA) 45 MCG/ACT inhaler Inhale up to 2 puffs every 4 hours as needed for shortness of breath or wheezing 1 Inhaler 5  . MAGNESIUM PO Take 1 tablet by mouth daily.    . mometasone-formoterol (DULERA) 200-5 MCG/ACT AERO Take 2 puffs first thing in am and then another 2 puffs about 12 hours later. 1 Inhaler 11  . omeprazole (PRILOSEC) 40 MG capsule Take 1 capsule (40 mg total) by mouth 2 (two) times daily. 60 capsule 11  . OXYGEN 2.5 L continuous.     . potassium chloride (KLOR-CON 10) 10 MEQ tablet Take 2 tablets (20 mEq total) by mouth daily. 60 tablet 11  . predniSONE (DELTASONE) 10 MG tablet 40mg X2 days, 20mg  X2 days, 10mg X2 days, then stop. 14 tablet 11  . Tiotropium Bromide Monohydrate (SPIRIVA RESPIMAT) 2.5 MCG/ACT AERS USE 2 PUFFS EVERY MORNING. 4 g 11  . Zinc 30 MG CAPS Take 1 capsule by mouth daily.      No current facility-administered medications for this encounter.     BP (!) 102/48   Pulse 85   Wt 110 lb (49.9 kg)   SpO2 100%   BMI 19.49 kg/m  General: NAD Neck: No JVD, no thyromegaly or thyroid nodule.  Lungs: Clear to auscultation bilaterally with normal respiratory effort. CV: Nondisplaced PMI.  Heart regular S1/S2, no S3/S4, no murmur.  No peripheral edema.  No carotid bruit.  Normal pedal pulses.  Abdomen: Soft, nontender, no hepatosplenomegaly, no distention.  Skin: Intact without lesions or rashes.  Neurologic: Alert and oriented x  3.  Psych: Normal affect. Extremities: No clubbing or cyanosis.  HEENT: Normal.   Assessment/Plan: 1. Cardiomyopathy: Last echo in 7/18 was normal.  I suspect that her prior low EF was due to a stress (Takotsubo-type) cardiomyopathy.  She is not volume overloaded on exam, do not think that periodic dyspnea is due to CHF.   - She will continue bisoprolol, irbesartan, and Lasix. I don't think she needs to take Lasix more than once a day.  She did not tolerate coming off irbesartan in past.   - BMET/Mg today.  2. Dyspnea: NYHA class III symptoms, chronic. Suspect combination of COPD/asthma and deconditioning due to neuromuscular disorder.  As above, doubt active CHF.  She is followed closely by Dr [name]. She is wearing oxygen now at all times.  She has periodic flares where she will take prednisone.  3. Neuromuscular disorder: Not fully characterized.  She is in a wheelchair for longer distances but can walk shorter distances with a walker. I have offered to refer her back to neurology in the past but she declined.  4. Palpitations: PACs + short runs atrial tachycardia on event monitor in 12/17.  2/19 event monitor showed no significant arrhythmias but she only wore it for a few days.   Followup 6 months.    Rawson Minix,MD 02/06/2018

## 2018-03-30 ENCOUNTER — Ambulatory Visit (INDEPENDENT_AMBULATORY_CARE_PROVIDER_SITE_OTHER)
Admission: RE | Admit: 2018-03-30 | Discharge: 2018-03-30 | Disposition: A | Discharge status: Home / Self Care | Payer: 59 | Source: Ambulatory Visit | Attending: Nurse Practitioner | Admitting: Nurse Practitioner

## 2018-03-30 ENCOUNTER — Encounter: Payer: Self-pay | Admitting: Nurse Practitioner

## 2018-03-30 ENCOUNTER — Other Ambulatory Visit (INDEPENDENT_AMBULATORY_CARE_PROVIDER_SITE_OTHER): Payer: 59

## 2018-03-30 ENCOUNTER — Ambulatory Visit (INDEPENDENT_AMBULATORY_CARE_PROVIDER_SITE_OTHER): Payer: 59 | Admitting: Nurse Practitioner

## 2018-03-30 ENCOUNTER — Telehealth: Payer: Self-pay | Admitting: Nurse Practitioner

## 2018-03-30 VITALS — BP 118/60 | HR 85 | Ht 63.0 in | Wt 110.0 lb

## 2018-03-30 DIAGNOSIS — R0602 Shortness of breath: Secondary | ICD-10-CM | POA: Diagnosis not present

## 2018-03-30 DIAGNOSIS — R058 Other specified cough: Secondary | ICD-10-CM

## 2018-03-30 DIAGNOSIS — R05 Cough: Secondary | ICD-10-CM

## 2018-03-30 DIAGNOSIS — R059 Cough, unspecified: Secondary | ICD-10-CM

## 2018-03-30 LAB — COMPREHENSIVE METABOLIC PANEL
ALT: 20 U/L (ref 0–35)
AST: 15 U/L (ref 0–37)
Albumin: 4.4 g/dL (ref 3.5–5.2)
Alkaline Phosphatase: 66 U/L (ref 39–117)
BUN: 20 mg/dL (ref 6–23)
CO2: 25 mEq/L (ref 19–32)
Calcium: 10 mg/dL (ref 8.4–10.5)
Chloride: 104 mEq/L (ref 96–112)
Creatinine, Ser: 0.54 mg/dL (ref 0.40–1.20)
GFR: 124.54 mL/min (ref >60.00)
Glucose, Bld: 133 mg/dL — ABNORMAL HIGH (ref 70–99)
Potassium: 3.8 mEq/L (ref 3.5–5.1)
Sodium: 140 mEq/L (ref 135–145)
Total Bilirubin: 0.3 mg/dL (ref 0.2–1.2)
Total Protein: 7.2 g/dL (ref 6.0–8.3)

## 2018-03-30 LAB — CBC
HCT: 35.6 % — ABNORMAL LOW (ref 36.0–46.0)
Hemoglobin: 12.1 g/dL (ref 12.0–15.0)
MCHC: 33.9 g/dL (ref 30.0–36.0)
MCV: 86.5 fl (ref 78.0–100.0)
Platelets: 273 10*3/uL (ref 150.0–400.0)
RBC: 4.12 Mil/uL (ref 3.87–5.11)
RDW: 13.9 % (ref 11.5–15.5)
WBC: 7.2 10*3/uL (ref 4.0–10.5)

## 2018-03-30 MED ORDER — AZITHROMYCIN 250 MG PO TABS: ORAL_TABLET | ORAL | 0 refills | Status: DC

## 2018-03-30 NOTE — Telephone Encounter (Signed)
Tracy from [facility]  CXR report   IMPRESSION: 1. COPD.  LEFT lung base scarring without focal consolidation. 2. RIGHT mid lung zone nodule. Recommend NONEMERGENT contrast enhanced CT chest. 3. These results will be called to the ordering clinician or representative by the Radiologist Assistant, and communication documented in the PACS or zVision Dashboard.  Routing to [place] NP

## 2018-03-30 NOTE — Progress Notes (Signed)
@Patient  ID: [name], female    DOB: Sep 12, 1960, 57 y.o.   MRN: [id number]  Chief Complaint  Patient presents with  . Cough    Referring provider: Gweneth Dimitri, MD   HPI  57 year old female former smoker with upper airway cough syndrome who is followed by Dr. [name].   Tests: ACEi d/c 08/18/13 >> marked improvement  MBS 10/29/2014  >> Diet Recommendations Dysphagia 1 (Puree);Thin liquid;Dysphagia 2  - Flare with perfume exp 07/2015 - Flare with dust exp - see ov 08/19/2015    OV 03/31/18 - acute - cough Patient presents with cough. States that her cough started 2 weeks ago. She coughed up blood once on 03/28/18. She is on O2 continuously at 2 L [place]. She started on prednisone taper that she had at home on 03/29/18. She states that her symptoms have improved slightly, but she still has the ongoing cough. She states that her chest feels very congested and is concerned that she has pneumonia. She has been compliant with xopenex, dulera, and spiriva. She denies any fever, shortness of breath, or chest pain.   Allergies  Allergen Reactions  . Scopolamine Anaphylaxis and Shortness Of Breath    Respiratory distress  . Ivp Dye [Iodinated Diagnostic Agents] Other (See Comments)    unknown  . Lisinopril     SOB, cough  . Carvedilol Rash    There is no immunization history for the selected administration types on file for this patient.  Past Medical History:  Diagnosis Date  . Acute liver failure   . Anemia   . Anorexia nervosa    h/o   . CAP (community acquired pneumonia)   . Cardiomyopathy (HCC)   . Difficult intubation    secondary to jaw muscles  . Fibromyalgia   . Fibromyalgia syndrome   . GERD (gastroesophageal reflux disease)   . IBS (irritable bowel syndrome)   . MS (multiple sclerosis) (HCC)   . PTSD (post-traumatic stress disorder)   . Sacral decubitus ulcer   . Shock circulatory (HCC)   . TMJ disease     Tobacco History: Social History   Tobacco Use    Smoking Status Former Smoker  . Packs/day: 1.00  . Years: 11.00  . Pack years: 11.00  . Types: Cigarettes  . Last attempt to quit: 07/07/1987  . Years since quitting: 30.7  Smokeless Tobacco Never Used   Counseling given: Yes   Outpatient Encounter Medications as of 03/30/2018  Medication Sig  . bisoprolol (ZEBETA) 5 MG tablet Take 1 tablet (5 mg total) by mouth daily.  . cholecalciferol (VITAMIN D) 1000 UNITS tablet Take 2,000 Units by mouth daily.   . furosemide (LASIX) 20 MG tablet TAKE (2) TABLETS TWICE DAILY.  Marland Kitchen irbesartan (AVAPRO) 75 MG tablet Take 1 tablet (75 mg total) by mouth daily.  Marland Kitchen levalbuterol (XOPENEX HFA) 45 MCG/ACT inhaler Inhale up to 2 puffs every 4 hours as needed for shortness of breath or wheezing  . MAGNESIUM PO Take 1 tablet by mouth daily.  . mometasone-formoterol (DULERA) 200-5 MCG/ACT AERO Take 2 puffs first thing in am and then another 2 puffs about 12 hours later.  . OXYGEN 2.5 L continuous.   . potassium chloride (KLOR-CON 10) 10 MEQ tablet Take 2 tablets (20 mEq total) by mouth daily.  . predniSONE (DELTASONE) 10 MG tablet 40mg X2 days, 20mg  X2 days, 10mg X2 days, then stop.  . raNITIdine HCl (ACID REDUCER PO) Take 40 mg by mouth 2 (two) times  daily. Uses Equate version of Prilosec. Patient unable to tolerate brand medication, it upset patient's stomach.  . Tiotropium Bromide Monohydrate (SPIRIVA RESPIMAT) 2.5 MCG/ACT AERS USE 2 PUFFS EVERY MORNING.  Marland Kitchen Zinc 30 MG CAPS Take 1 capsule by mouth daily.   . [DISCONTINUED] omeprazole (PRILOSEC) 40 MG capsule Take 1 capsule (40 mg total) by mouth 2 (two) times daily. (Patient not taking: Reported on 03/30/2018)   No facility-administered encounter medications on file as of 03/30/2018.      Review of Systems  Review of Systems  Constitutional: Negative.  Negative for chills and fever.  HENT: Negative.  Negative for congestion.   Respiratory: Positive for cough. Negative for shortness of breath and wheezing.    Cardiovascular: Negative.  Negative for chest pain, palpitations and leg swelling.  Gastrointestinal: Negative.   Allergic/Immunologic: Negative.   Neurological: Negative.   Psychiatric/Behavioral: Negative.        Physical Exam  BP 118/60 (BP Location: Left Arm, Patient Position: Sitting, Cuff Size: Normal)   Pulse 85   Ht 5\' 3"  (1.6 m)   Wt 110 lb (49.9 kg)   SpO2 98%   BMI 19.49 kg/m   Wt Readings from Last 5 Encounters:  03/30/18 110 lb (49.9 kg)  02/04/18 110 lb (49.9 kg)  11/30/17 104 lb (47.2 kg)  08/03/17 103 lb 6 oz (46.9 kg)  06/01/17 100 lb (45.4 kg)     Physical Exam  Constitutional: She is oriented to person, place, and time. She appears well-developed and well-nourished. No distress.  Cardiovascular: Normal rate and regular rhythm.  Pulmonary/Chest: Effort normal and breath sounds normal.  Neurological: She is alert and oriented to person, place, and time.  Psychiatric: She has a normal mood and affect.  Nursing note and vitals reviewed.    Imaging: Dg Chest 2 View  Result Date: 03/30/2018 CLINICAL DATA:  Cough, congestion and shortness of breath for 2 weeks. Hemoptysis. History of pneumonia. EXAM: CHEST - 2 VIEW COMPARISON:  Chest radiograph June 04, 2015 FINDINGS: Cardiomediastinal silhouette is normal. No pleural effusions or focal consolidations. Hyperinflation with LEFT lung base scarring. Mild chronic interstitial changes increased from prior examination. 8 mm nodule RIGHT mid lung zone. Biapical pleuroparenchymal scarring. Trachea projects midline and there is no pneumothorax. Soft tissue planes and included osseous structures are non-suspicious. IMPRESSION: 1. COPD.  LEFT lung base scarring without focal consolidation. 2. RIGHT mid lung zone nodule. Recommend NONEMERGENT contrast enhanced CT chest. 3. These results will be called to the ordering clinician or representative by the Radiologist Assistant, and communication documented in the PACS or  zVision Dashboard. Electronically Signed   By: Awilda Metro M.D.   On: 03/30/2018 16:14     Assessment & Plan:   Upper airway cough syndrome Patient Instructions  Continue prednisone taper Continue xopenex, dulera, and spiriva Will check labs and call with results Will check chest x ray and call with results Keep scheduled appointment with Dr. [name] or return sooner if needed       Ivonne Andrew, NP 03/31/2018

## 2018-03-30 NOTE — Patient Instructions (Addendum)
Continue prednisone taper Continue xopenex, dulera, and spiriva Will check labs and call with results Will check chest x ray and call with results Keep scheduled appointment with Dr. [name] or return sooner if needed

## 2018-03-31 ENCOUNTER — Encounter: Payer: Self-pay | Admitting: Nurse Practitioner

## 2018-03-31 NOTE — Assessment & Plan Note (Signed)
Patient Instructions  Continue prednisone taper Continue xopenex, dulera, and spiriva Will check labs and call with results Will check chest x ray and call with results Keep scheduled appointment with Dr. [name] or return sooner if needed

## 2018-03-31 NOTE — Progress Notes (Signed)
Chart and office note reviewed in detail along with available xrays/ labs > agree with a/p as outlined   She has an extremely vague density only seen in mid chest and would be a very poor candidate for any form of intervention even if proved to be a neoplasm so will f/u conservatively with cxr on return

## 2018-04-12 ENCOUNTER — Ambulatory Visit (INDEPENDENT_AMBULATORY_CARE_PROVIDER_SITE_OTHER)
Admission: RE | Admit: 2018-04-12 | Discharge: 2018-04-12 | Disposition: A | Discharge status: Home / Self Care | Payer: 59 | Source: Ambulatory Visit | Attending: Internal Medicine | Admitting: Internal Medicine

## 2018-04-12 ENCOUNTER — Telehealth: Payer: Self-pay | Admitting: Internal Medicine

## 2018-04-12 ENCOUNTER — Encounter: Payer: Self-pay | Admitting: Internal Medicine

## 2018-04-12 ENCOUNTER — Other Ambulatory Visit: Payer: Self-pay

## 2018-04-12 ENCOUNTER — Ambulatory Visit (INDEPENDENT_AMBULATORY_CARE_PROVIDER_SITE_OTHER): Payer: 59 | Admitting: Internal Medicine

## 2018-04-12 VITALS — BP 94/60 | HR 88 | Ht 63.0 in | Wt 109.0 lb

## 2018-04-12 DIAGNOSIS — J449 Chronic obstructive pulmonary disease, unspecified: Secondary | ICD-10-CM | POA: Diagnosis not present

## 2018-04-12 DIAGNOSIS — R05 Cough: Secondary | ICD-10-CM

## 2018-04-12 DIAGNOSIS — R058 Other specified cough: Secondary | ICD-10-CM

## 2018-04-12 DIAGNOSIS — R0600 Dyspnea, unspecified: Secondary | ICD-10-CM

## 2018-04-12 DIAGNOSIS — R0609 Other forms of dyspnea: Secondary | ICD-10-CM

## 2018-04-12 DIAGNOSIS — R0602 Shortness of breath: Secondary | ICD-10-CM | POA: Diagnosis not present

## 2018-04-12 MED ORDER — MEPERIDINE HCL 50 MG/5ML PO SOLN: 50.0000 mg | ORAL | 0 refills | Status: DC | PRN

## 2018-04-12 MED ORDER — MEPERIDINE HCL 50 MG PO TABS: 50.0000 mg | ORAL_TABLET | ORAL | 0 refills | Status: DC | PRN

## 2018-04-12 NOTE — Telephone Encounter (Signed)
Rx printed and signed by Dr. [name] and is being placed up front for pt.  Pt will be by tomorrow, 04/13/18 to pick Rx up. Pt states her spouse might be the one to come pick up Rx.  Nothing further needed.

## 2018-04-12 NOTE — Telephone Encounter (Signed)
Demerol 50 mg / 76ml  So give 32ml (one tsp) q4 h prn to equal demerol 50 mg tablets and then 150 ml = 30 doses

## 2018-04-12 NOTE — Progress Notes (Signed)
Subjective:    Patient ID: [name], female    DOB: 10/02/1960   MRN: [id number]    Brief patient profile:  57 yowf quit smoking 1989 then ? MS dx'd early 45's by [facility] (though pt states flatly she doesn't have MS)  and "it's been steadily downhill since" mostly muscle related but since 2012 also with breathing difficulty > admit:   Admit date: 06/06/2012  Discharge date: 06/20/2012  Discharge Diagnoses:  Acute respiratory failure  CAP (community acquired pneumonia) vs aspiration pneumonia  Acute encephalopathy, resolved  Neuromuscular disorder: not fully defined.  Anemia  Thrombocytopenia  Coagulopathy  Acute liver failure  Enterococcal UTI (pansensitive)  Shock circulatory, suspect primarily cardiogenic  Sacral Pressure Ulcer  Dilated cardiomyopathy  Malnutrition compromising bodily function  H/O anorexia nervosa  Dysphagia    History of Present Illness  08/17/2013 1st [facility] office visit/ [name] was able do some adl/s with walker and no 02 summer of 2014  then downhill since  And now sob at rest  X 2 weeks and able to lie down at 45 degrees and can't tol lying flat at all for sev weeks assoc with dry cough day and night and dysphagia/ choking sensation on acei.  Already on gerd rx/ inhaler no benefit.  rec Stop lisinopril Avapro 75 mg one daily  Prilosec 20 mg Take 30- 60 min before your first and last meals of the day  GERD diet     09/15/2013 f/u ov/Emaad Nanna re: acei vs copd  Chief Complaint  Patient presents with  . Follow-up    Pt states that her SOB/cough has improved greatly.  Pt still c/o SOB but is much better since lv.    limited more by fatigue and weakness than sob, swallowing fine now  rec spiriva 2puffs each am to see if your activity tolerance improves> improved so maint rx    09/05/2014 acute ov/Deltha Bernales re: worse sob/increase need for saba on spiriva maint rx  Chief Complaint  Patient presents with  . Acute Visit    Pt c/o increased  SOB- started mid Nov 2015, worse for the past month. She is using xopenex 2-3 x per day on average. She c/o cough for the past month- sometimes prod with clear yellow/green sputum. She has had some occ bloody nasal d/c.  Has not been able to lie down for several years, sitting upright in hosp bed due to sob Swallowing worse "because cough is worse" despite  on prilosec 20 bid with meals  Denies choking but all foods are pureed. rec Change omeprazole to where you take 20mg  x 2 Take 30- 60 min before your first and last meals of the day  GERD   Try flutter valve as needed for cough or congestion > ok to cough into the valve if helps    10/25/2014 f/u ov/Tristram Milian re: mild copd/ MS with chronic dysphagia/ off pred x 21 days prior to OV   Chief Complaint  Patient presents with  . Acute Visit    Pt c/o decreased o2 sats since 09/26/14- feels fatigued and more SOB. She states that she is also aspirating more and got choked drinking water recently.   while on prednisone did not use any xopenex hfa but prior was using it up 3 x daily  On prednisone cough also goes away  But worse in am's without it. Thinks she may be aspirating more since Nov 2015 >   Convinced her copd is progressing based on her  readings Mucus is minimal, no food in it rec Please see patient coordinator before you leave today  to schedule overnight oximetry> neg   modified barium swallow>MBS 10/29/2014  Diet Recommendations Dysphagia 1 (Puree);Thin liquid;Dysphagia 2  (Fine chop)  Liquid Administration via Cup  Medication Administration Crushed with puree  Compensations Slow rate;Small sips/bites;Multiple dry swallows  after each bite/sip  Postural Changes and/or Swallow Maneuvers Seated upright 90  degrees;Chin tuck Add automatically Dulera 100 Take 2 puffs first thing in am and then another 2 puffs about 12 hours later.  Plan B = backup  Only use your levoalbuterol  If needed  Cough into the flutter valve if having trouble  getting anything up       06/04/2015  f/u ov/Chane Cowden re:  Probable copd on spiriva and dulera 100 2bid Chief Complaint  Patient presents with  . Follow-up    Pt c/o increased SOB for the 3 wks. She states that she coughs more when she talks and produces clear sputum.  She has been using her o2 more, and xopenex 1-2 x per wk.   Uses 02 daytime prn  Never uses at hs  Not seeing neuro any more / "the only reason my muscles get worse is that my breathing gets worse" swallowing no change. Prednisone really helps breathing  rec Increase dulera to 200 Take 2 puffs first thing in am and then another 2 puffs about 12 hours later.  02 2lpm at bedtime and during the day goal is  to keep above 90% so ok to adjust up down or off to achieve that goal Prednisone 10 mg take  4 each am x 2 days,   2 each am x 2 days,  1 each am x 2 days and stop   07/12/15 requested pred rx p perfume exp     06/01/2017  f/u ov/Airyanna Dipalma re:  Copd/ ab  Chief Complaint  Patient presents with  . Follow-up    Pt states her breathing is okay today. She states that she had trouble back in the summer. She feels like her baseline has changed. She increased her o2 to 2.5 lpm in Aug 2018 and this helped. She is using her xopenex inhaler 2 x per wk on average.   sleeping fine on 2.5 lpm s never noct xopenex but sleeping 90% hob elevation x years o/w immediate sense of smothering  Variably sob responds to saba. Needs pred cycles every 2.5 weeks x 6 days much better Continues to titrate 02 for sob, not sats  rec No change rx   11/30/2017  f/u ov/Wyndell Cardiff re: copd/ab  Chief Complaint  Patient presents with  . Follow-up    Breathing has been worse recently- relates to humid weather. She is coughing up yellow to green sputum. She is using her xopenex inhaler 2 x per wk on average.   Dyspnea:  2.5  lpm walking 50 ft which is improved over baseline  Cough: variably dark  Sleep:  90 degrees hosp bed otherwise can't breathe comfortably even  on pred  SABA use: as above   Last pred was finished one week prior to OV   rec Omeprazole 40 Take 30- 60 min before your first and last meals of the day to take instead of the over the counter version No change in respiratory medications         04/12/2018 acute extended ov/Amaris Garrette ZO:XWRUE sob  since late August 2019  Chief Complaint  Patient presents with  . Acute  Visit    She states her breathing is not improving since the last visit. She is wheezing some. She states "there is a lack of expansion in my lungs". She is using her xopenex inhaler every morning to help with wheezing.   usual pattern  cough/ wheeze tried prednisone late Aug 2019 this time no better, some better at least for four hours p xopenex hfa never more than a few times a week but did not respond to prednisone and in fact worse then hemoptysis/cp w/in a few days of ov with NP 03/30/18 who rec Continue prednisone taper Continue xopenex, dulera, and spiriva Zpak and increase 02 if needed to 3lpm > smidge better  starting 10 d prior to OV using xopenex with am dulera / spiriva and increased 02 to 3lpm and restarted prednisone 3 d prior to OV    Cp varies posteriorly = on both sides not really pleuritic but p coughing fits feels sore   No obvious day to day or daytime variability or assoc excess/ purulent sputum or mucus plugs or hemoptysis  or chest tightness,   or overt sinus or hb symptoms.   Sleeping in upright position   without nocturnal    exacerbation  of respiratory  c/o's or need for noct saba. Also denies any obvious fluctuation of symptoms with weather or environmental changes or other aggravating or alleviating factors except as outlined above   No unusual exposure hx or h/o childhood pna/ asthma or knowledge of premature birth.  Current Allergies, Complete Past Medical History, Past Surgical History, Family History, and Social History were reviewed in [employer] record.  ROS  The  following are not active complaints unless bolded Hoarseness, sore throat, dysphagia, dental problems, itching, sneezing,  nasal congestion or discharge of excess mucus or purulent secretions, ear ache,   fever, chills, sweats, unintended wt loss or wt gain, classically pleuritic or exertional cp,  orthopnea pnd or arm/hand swelling  or leg swelling, presyncope, palpitations, abdominal pain, anorexia, nausea, vomiting, diarrhea  or change in bowel habits or change in bladder habits, change in stools or change in urine, dysuria, hematuria,  rash, arthralgias, visual complaints, headache p severe cough  numbness, weakness or ataxia or problems with walking or coordination,  change in mood or  memory.        Current Meds  - NOTE:   Unable to verify as accurately reflecting what pt takes     Medication Sig  . bisoprolol (ZEBETA) 5 MG tablet Take 1 tablet (5 mg total) by mouth daily.  . cholecalciferol (VITAMIN D) 1000 UNITS tablet Take 2,000 Units by mouth daily.   . furosemide (LASIX) 20 MG tablet TAKE (2) TABLETS TWICE DAILY.  Marland Kitchen irbesartan (AVAPRO) 75 MG tablet Take 1 tablet (75 mg total) by mouth daily.  Marland Kitchen levalbuterol (XOPENEX HFA) 45 MCG/ACT inhaler Inhale up to 2 puffs every 4 hours as needed for shortness of breath or wheezing  . MAGNESIUM PO Take 1 tablet by mouth daily.  . mometasone-formoterol (DULERA) 200-5 MCG/ACT AERO Take 2 puffs first thing in am and then another 2 puffs about 12 hours later.  . OXYGEN 3 L continuous.   . potassium chloride (KLOR-CON 10) 10 MEQ tablet Take 2 tablets (20 mEq total) by mouth daily.  . predniSONE (DELTASONE) 10 MG tablet 40mg X2 days, 20mg  X2 days, 10mg X2 days, then stop.  . raNITIdine HCl (ACID REDUCER PO) Take 40 mg by mouth 2 (two) times daily. Uses Equate version  of Prilosec. Patient unable to tolerate brand medication, it upset patient's stomach.  . Tiotropium Bromide Monohydrate (SPIRIVA RESPIMAT) 2.5 MCG/ACT AERS USE 2 PUFFS EVERY MORNING.  Marland Kitchen Zinc 30  MG CAPS Take 1 capsule by mouth daily.                                 Objective:   Physical Exam   W/c bound wf nad        Vital signs reviewed - Note on arrival 02 sats  98% on    3lpm cont    09/15/2013       113  > 10/27/2013   113 > 05/07/2014  111>  112 09/05/2014  > 10/25/2014  110 > 12/06/2014  110 > 03/07/2015  110> 06/04/2015  109 > 08/19/2015  105   > 11/18/2015    102 > 05/26/2016   96 >  11/30/2017  104 > 04/12/2018  109     08/17/13 113 lb (51.256 kg)  07/18/13 114 lb 6.4 oz (51.891 kg)  06/21/13 112 lb (50.803 kg)     HEENT: nl dentition, turbinates bilaterally, and oropharynx but limited exam as can't open mouth well . Nl external ear canals without cough reflex   NECK :  without JVD/Nodes/TM/ nl carotid upstrokes bilaterally   LUNGS: no acc muscle use,  Nl contour chest which is completely clear to A and P bilaterally without cough on insp or exp maneuvers   CV:  RRR  no s3 or murmur or increase in P2, and no edema   ABD:  soft and nontender with nl inspiratory excursion in the supine position. No bruits or organomegaly appreciated, bowel sounds nl  MS:  Nl gait/ ext warm without deformities, calf tenderness, cyanosis or clubbing No obvious joint restrictions   SKIN: warm and dry without lesions    NEURO:  alert, anxious/ w/c bound  With nl sensorium         CXR PA and Lateral:   04/12/2018 :    I personally reviewed images and agree with radiology impression as follows:     COPD/chronic changes.  No acute cardiopulmonary disease.   Labs  Reviewed 04/12/2018      Chemistry      Component Value Date/Time   NA 140 03/30/2018 1440   K 3.8 03/30/2018 1440   CL 104 03/30/2018 1440   CO2 25 03/30/2018 1440   BUN 20 03/30/2018 1440   CREATININE 0.54 03/30/2018 1440   CREATININE 0.53 05/27/2016 1623      Component Value Date/Time   CALCIUM 10.0 03/30/2018 1440   ALKPHOS 66 03/30/2018 1440   AST 15 03/30/2018 1440   ALT 20 03/30/2018 1440    BILITOT 0.3 03/30/2018 1440        Lab Results  Component Value Date   WBC 7.2 03/30/2018   HGB 12.1 03/30/2018   HCT 35.6 (L) 03/30/2018   MCV 86.5 03/30/2018   PLT 273.0 03/30/2018                       Assessment & Plan:

## 2018-04-12 NOTE — Telephone Encounter (Signed)
Ok to change to equivalent in liquid form

## 2018-04-12 NOTE — Patient Instructions (Addendum)
Only use your Levoalbuterol as a rescue medication to be used if you can't catch your breath by resting or doing a relaxed purse lip breathing pattern.  - The less you use it, the better it will work when you need it. - Ok to use up to 2 puffs  every 4 hours if you must but call for immediate appointment if use goes up over your usual need - Don't leave home without it !!  (think of it like the spare tire for your car)   Work on inhaler technique:  relax and gently blow all the way out then take a nice smooth deep breath back in, triggering the inhaler at same time you start breathing in.  Hold for up to 5 seconds if you can. Blow dulera out thru nose. Rinse and gargle with water when done  Please see patient coordinator before you leave today  to schedule CTa of chest > changed to v/q due to contrast allergy    Take delsym two tsp every 12 hours and supplement if needed with  Demerol 50 mg up to 1  every 4 hours to suppress the urge to cough. Swallowing water and/or using ice chips/non mint and menthol containing candies (such as lifesavers or sugarless jolly ranchers) are also effective.  You should rest your voice and avoid activities that you know make you cough.  Once you have eliminated the cough for 3 straight days try reducing the demerol  first,  then the delsym as tolerated.  Keep your regular appt unless you don't feel better on above recs after a week or so > to ER if worse in meantime

## 2018-04-12 NOTE — Telephone Encounter (Signed)
Called and spoke with pt who stated the demerol is no longer being made in tablet form that it is just in liquid form.  Stated to pt I would send this to Dr. [name] for clarification on instructions for the Rx for pt.  Dr. [name], please advise.  Plan: Take delsym two tsp every 12 hours and supplement if needed with  Demerol 50 mg tablet up to 1  every 4 hours to suppress the urge to cough.

## 2018-04-12 NOTE — Telephone Encounter (Signed)
Called and spoke with [name] from the pharmacy. She stated that they cannot change it since it will be going from tablet to liquid they need another prescription for the liquid form  MW please advise on liquid form, quantity, and refill amount. Thanks.

## 2018-04-13 ENCOUNTER — Encounter: Payer: Self-pay | Admitting: Internal Medicine

## 2018-04-13 NOTE — Assessment & Plan Note (Signed)
-   sp smoking cessation in 1989 - see f/v loops 08/01/13 c/w upper airway obstruction > refused to repeat off acei  - trial off acei 08/18/2013 > marked improvement - 09/15/2013   Walked RA x about 50 ft and stopped due to  Fatigue s sob or desat - Spiriva trial 09/15/2013 > improved 10/27/13 so continue - added flutter valve 09/05/2014  - 10/25/2014  added dulera 100 2bid based on reported improvement with prednisone> much better  - ono RA 10/25/2014 > neg for desats   - MBS 10/25/2014 > see dysphagia - 05/2015  Started 6 day cycles of empirical  prednisone  For flares  - 08/19/2015  p extensive coaching HFA effectiveness =    75% and has trouble with hand strength limiting  - reported improved on 02 11/18/2015 (purchased on her own)  Continues to self titrate 02 though no evidence she needs it

## 2018-04-13 NOTE — Assessment & Plan Note (Addendum)
-   sp smoking cessation in 1989 - see f/v loops 08/01/13 c/w upper airway obstruction > refused to repeat off acei  - trial off acei 08/18/2013 > marked improvement - 09/15/2013   Walked RA x about 50 ft and stopped due to  Fatigue s sob or desat - Spiriva trial 09/15/2013 > improved 10/27/13 so continue - added flutter valve 09/05/2014  - 10/25/2014  added dulera 100 2bid based on reported improvement with prednisone> much better - requiring prn pred since 05/2015 s documentation it helps (refuses pfts)       - The proper method of use, as well as anticipated side effects, of a metered-dose inhaler are discussed and demonstrated to the patient. Improved effectiveness after extensive coaching during this visit to a level of approximately 75 % from a baseline of 50 %  > continue hfa/ smi    DDX of  difficult airways management almost all start with A and  include Adherence, Ace Inhibitors, Acid Reflux, Active Sinus Disease, Alpha 1 Antitripsin deficiency, Anxiety masquerading as Airways dz,  ABPA,  Allergy(esp in young), Aspiration (esp in elderly), Adverse effects of meds,  Active smokers, A bunch of PE's (a small clot burden can't cause this syndrome unless there is already severe underlying pulm or vascular dz with poor reserve) plus two Bs  = Bronchiectasis and Beta blocker use..and one C= CHF   Adherence is always the initial "prime suspect" and is a multilayered concern that requires a "trust but verify" approach in every patient - starting with knowing how to use medications, especially inhalers, correctly, keeping up with refills and understanding the fundamental difference between maintenance and prns vs those medications only taken for a very short course and then stopped and not refilled.  - see hfa teaching   ? Acid (or non-acid) GERD > always difficult to exclude as up to 75% of pts in some series report no assoc GI/ Heartburn symptoms> rec max (24h)  acid suppression and diet restrictions/  reviewed and instructions given in writing.   ? Anxiety > usually at the bottom of this list of usual suspects but should be much higher on this pt's based on H and P and   may interfere with adherence and also interpretation of response or lack thereof to symptom management which can be quite subjective.   ? A bunch of PE's > v/q ordered   ? BB effects > very unlikely on Zebeta   ? 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

## 2018-04-13 NOTE — Assessment & Plan Note (Addendum)
ACEi d/c 08/18/13 >> marked improvement  MBS 10/29/2014  >> Diet Recommendations Dysphagia 1 (Puree);Thin liquid;Dysphagia 2  - Flare with perfume exp 07/2015 - Flare with dust exp - see ov 08/19/2015    Upper airway cough syndrome (previously labeled PNDS),  is so named because it's frequently impossible to sort out how much is  CR/sinusitis with freq throat clearing (which can be related to primary GERD)   vs  causing  secondary (" extra esophageal")  GERD from wide swings in gastric pressure that occur with throat clearing, often  promoting self use of mint and menthol lozenges that reduce the lower esophageal sphincter tone and exacerbate the problem further in a cyclical fashion.   These are the same pts (now being labeled as having "irritable larynx syndrome" by some cough centers) who not infrequently have a history of having failed to tolerate ace inhibitors,  dry powder inhalers or biphosphonates or report having atypical/extraesophageal reflux symptoms that don't respond to standard doses of PPI  and are easily confused as having aecopd or asthma flares by even experienced allergists/ pulmonologists (myself included).   Of the three most common causes of  Sub-acute / recurrent or chronic cough, only one (GERD)  can actually contribute to/ trigger  the other two (asthma and post nasal drip syndrome)  and perpetuate the cylce of cough.  While not intuitively obvious, many patients with chronic low grade reflux do not cough until there is a primary insult that disturbs the protective epithelial barrier and exposes sensitive nerve endings.   This is typically viral but can due to PNDS and  either may apply here.      The point is that once this occurs, it is difficult to eliminate the cycle  using anything but a maximally effective acid suppression regimen at least in the short run, accompanied by an appropriate diet to address non acid GERD and control / eliminate the cough itself for at least 3  days with demerol if needed    I had an extended discussion with the patient reviewing all relevant studies completed to date and  lasting 25 minutes of a 40  minute acute office visit to address  severe non-specific but potentially very serious refractory respiratory symptoms of uncertain and potentially multiple  Etiologies.  See device teaching which extended face to face time for this visit    Each maintenance medication was reviewed in detail including most importantly the difference between maintenance and prns and under what circumstances the prns are to be triggered using an action plan format that is not reflected in the computer generated alphabetically organized AVS.    Please see AVS for specific instructions unique to this office visit that I personally wrote and verbalized to the the pt in detail and then reviewed with pt  by my nurse highlighting any changes in therapy/plan of care  recommended at today's visit.

## 2018-04-18 ENCOUNTER — Ambulatory Visit (HOSPITAL_COMMUNITY)
Admission: RE | Admit: 2018-04-18 | Discharge: 2018-04-18 | Disposition: A | Discharge status: Home / Self Care | Payer: 59 | Source: Ambulatory Visit | Attending: Internal Medicine | Admitting: Internal Medicine

## 2018-04-18 ENCOUNTER — Telehealth: Payer: Self-pay | Admitting: Internal Medicine

## 2018-04-18 ENCOUNTER — Encounter (HOSPITAL_COMMUNITY)
Admission: RE | Admit: 2018-04-18 | Discharge: 2018-04-18 | Disposition: A | Discharge status: Home / Self Care | Payer: 59 | Source: Ambulatory Visit | Attending: Internal Medicine | Admitting: Internal Medicine

## 2018-04-18 DIAGNOSIS — R0602 Shortness of breath: Secondary | ICD-10-CM | POA: Diagnosis present

## 2018-04-18 DIAGNOSIS — R0600 Dyspnea, unspecified: Secondary | ICD-10-CM

## 2018-04-18 DIAGNOSIS — R0609 Other forms of dyspnea: Principal | ICD-10-CM

## 2018-04-18 DIAGNOSIS — R918 Other nonspecific abnormal finding of lung field: Secondary | ICD-10-CM | POA: Insufficient documentation

## 2018-04-18 MED ORDER — TECHNETIUM TC 99M DIETHYLENETRIAME-PENTAACETIC ACID
32.8000 | Freq: Once | INTRAVENOUS | Status: AC | PRN
  Administered 2018-04-18: 32.8 via INTRAVENOUS

## 2018-04-18 MED ORDER — TECHNETIUM TO 99M ALBUMIN AGGREGATED
4.4000 | Freq: Once | INTRAVENOUS | Status: AC | PRN
  Administered 2018-04-18: 4.4 via INTRAVENOUS

## 2018-04-18 NOTE — Progress Notes (Signed)
Spoke with pt and notified of results per Dr. Wert. Pt verbalized understanding and denied any questions. 

## 2018-04-18 NOTE — Telephone Encounter (Signed)
Received a call from [place] with [facility] in regards to results of pt's cxr.  The results are shown below: IMPRESSION: 1. Similar findings of lung hyperexpansion, chronic bronchitic change and left basilar atelectasis/scar without superimposed acute cardiopulmonary disease. 2. Asymmetric left apical pleuroparenchymal thickening, grossly unchanged compared to the 03/2018 examination though appears new compared to the 05/2015 exam. Further evaluation with chest CT could be performed as clinically indicated  Pt is scheduled to have PET performed today, 10/14 at 11am.  Routing to MW as an Financial planner

## 2018-05-12 NOTE — Telephone Encounter (Signed)
See phone note

## 2018-06-07 ENCOUNTER — Ambulatory Visit (INDEPENDENT_AMBULATORY_CARE_PROVIDER_SITE_OTHER): Payer: 59 | Admitting: Internal Medicine

## 2018-06-07 ENCOUNTER — Encounter: Payer: Self-pay | Admitting: Internal Medicine

## 2018-06-07 VITALS — BP 118/66 | HR 85 | Ht 63.0 in | Wt 110.0 lb

## 2018-06-07 DIAGNOSIS — R058 Other specified cough: Secondary | ICD-10-CM

## 2018-06-07 DIAGNOSIS — J449 Chronic obstructive pulmonary disease, unspecified: Secondary | ICD-10-CM

## 2018-06-07 DIAGNOSIS — R05 Cough: Secondary | ICD-10-CM

## 2018-06-07 MED ORDER — AZITHROMYCIN 250 MG PO TABS: ORAL_TABLET | ORAL | 11 refills | Status: DC

## 2018-06-07 MED ORDER — PREDNISONE 10 MG PO TABS: ORAL_TABLET | ORAL | 0 refills | Status: DC

## 2018-06-07 NOTE — Progress Notes (Signed)
Subjective:   Patient ID: [name], female    DOB: 29-Sep-1960   MRN: [id number]    Brief patient profile:  57 yowf quit smoking 1989 then ? MS dx'd early 40's by [facility] (though pt states flatly she doesn't have MS)  and "it's been steadily downhill since" mostly muscle related but since 2012 also with breathing difficulty > admit:   Admit date: 06/06/2012  Discharge date: 06/20/2012  Discharge Diagnoses:  Acute respiratory failure  CAP (community acquired pneumonia) vs aspiration pneumonia  Acute encephalopathy, resolved  Neuromuscular disorder: not fully defined.  Anemia  Thrombocytopenia  Coagulopathy  Acute liver failure  Enterococcal UTI (pansensitive)  Shock circulatory, suspect primarily cardiogenic  Sacral Pressure Ulcer  Dilated cardiomyopathy  Malnutrition compromising bodily function  H/O anorexia nervosa  Dysphagia    History of Present Illness  08/17/2013 1st [facility] office visit/ [name] was able do some adl/s with walker and no 02 summer of 2014  then downhill since  And now sob at rest  X 2 weeks and able to lie down at 45 degrees and can't tol lying flat at all for sev weeks assoc with dry cough day and night and dysphagia/ choking sensation on acei.  Already on gerd rx/ inhaler no benefit.  rec Stop lisinopril Avapro 75 mg one daily  Prilosec 20 mg Take 30- 60 min before your first and last meals of the day  GERD diet     09/15/2013 f/u ov/Hugo Lybrand re: acei vs copd  Chief Complaint  Patient presents with  . Follow-up    Pt states that her SOB/cough has improved greatly.  Pt still c/o SOB but is much better since lv.    limited more by fatigue and weakness than sob, swallowing fine now  rec spiriva 2puffs each am to see if your activity tolerance improves> improved so maint rx    09/05/2014 acute ov/Tiernan Suto re: worse sob/increase need for saba on spiriva maint rx  Chief Complaint  Patient presents with  . Acute Visit    Pt c/o increased  SOB- started mid Nov 2015, worse for the past month. She is using xopenex 2-3 x per day on average. She c/o cough for the past month- sometimes prod with clear yellow/green sputum. She has had some occ bloody nasal d/c.  Has not been able to lie down for several years, sitting upright in hosp bed due to sob Swallowing worse "because cough is worse" despite  on prilosec 20 bid with meals  Denies choking but all foods are pureed. rec Change omeprazole to where you take 20mg  x 2 Take 30- 60 min before your first and last meals of the day  GERD   Try flutter valve as needed for cough or congestion > ok to cough into the valve if helps    10/25/2014 f/u ov/Emme Rosenau re: mild copd/ MS with chronic dysphagia/ off pred x 21 days prior to OV   Chief Complaint  Patient presents with  . Acute Visit    Pt c/o decreased o2 sats since 09/26/14- feels fatigued and more SOB. She states that she is also aspirating more and got choked drinking water recently.   while on prednisone did not use any xopenex hfa but prior was using it up 3 x daily  On prednisone cough also goes away  But worse in am's without it. Thinks she may be aspirating more since Nov 2015 >   Convinced her copd is progressing based on her readings  Mucus is minimal, no food in it rec Please see patient coordinator before you leave today  to schedule overnight oximetry> neg   modified barium swallow>MBS 10/29/2014  Diet Recommendations Dysphagia 1 (Puree);Thin liquid;Dysphagia 2  (Fine chop)  Liquid Administration via Cup  Medication Administration Crushed with puree  Compensations Slow rate;Small sips/bites;Multiple dry swallows  after each bite/sip  Postural Changes and/or Swallow Maneuvers Seated upright 90  degrees;Chin tuck Add automatically Dulera 100 Take 2 puffs first thing in am and then another 2 puffs about 12 hours later.  Plan B = backup  Only use your levoalbuterol  If needed  Cough into the flutter valve if having trouble  getting anything up       06/04/2015  f/u ov/Dashiel Bergquist re:  Probable copd on spiriva and dulera 100 2bid Chief Complaint  Patient presents with  . Follow-up    Pt c/o increased SOB for the 3 wks. She states that she coughs more when she talks and produces clear sputum.  She has been using her o2 more, and xopenex 1-2 x per wk.   Uses 02 daytime prn  Never uses at hs  Not seeing neuro any more / "the only reason my muscles get worse is that my breathing gets worse" swallowing no change. Prednisone really helps breathing  rec Increase dulera to 200 Take 2 puffs first thing in am and then another 2 puffs about 12 hours later.  02 2lpm at bedtime and during the day goal is  to keep above 90% so ok to adjust up down or off to achieve that goal Prednisone 10 mg take  4 each am x 2 days,   2 each am x 2 days,  1 each am x 2 days and stop   07/12/15 requested pred rx p perfume exp     04/12/2018 acute extended ov/Zakaiya Lares QQ:I[facility]NL sob  since late August 2019  Chief Complaint  Patient presents with  . Acute Visit    She states her breathing is not improving since the last visit. She is wheezing some. She states "there is a lack of expansion in my lungs". She is using her xopenex inhaler every morning to help with wheezing.   usual pattern  cough/ wheeze tried prednisone late Aug 2019 this time no better, some better at least for four hours p xopenex hfa never more than a few times a week but did not respond to prednisone and in fact worse then hemoptysis/cp w/in a few days of ov with NP 03/30/18 who rec Continue prednisone taper Continue xopenex, dulera, and spiriva Zpak and increase 02 if needed to 3lpm > smidge better  starting 10 d prior to OV using xopenex with am dulera / spiriva and increased 02 to 3lpm and restarted prednisone 3 d prior to OV   Cp varies posteriorly = on both sides not really pleuritic but p coughing fits feels sore rec Only use your Levoalbuterol as a rescue medication  Work on  inhaler technique:    Please see patient coordinator before you leave today  to schedule CTa of chest > changed to v/q due to contrast allergy  Take delsym two tsp every 12 hours and supplement if needed with  Demerol 50 mg up to 1  every 4 hours to suppress the urge to cough.  Once you have eliminated the cough for 3 straight days try reducing the demerol  first,  then the delsym as tolerated.      06/07/2018  f/u  ov/Delane Wessinger re: copd  GOLD 0 / uacs  Chief Complaint  Patient presents with  . Follow-up    Breathing has improved some. She states still does not have much stamina. She has not needed her xopenex inhaler.   Dyspnea:   15 ft and stops/ 02 helps despite absence of desats documented  Cough: better, does seem to respond to prednisone - restarts every 2-3 weeks  X 6 days desptite dulera 200 /spiriva  Sleeping: 90 degrees hosp bed  SABA use: none  02: 3lpm 24/7   Nasal discharge better p zpak  No more cp or hemoptysis    No obvious day to day or daytime variability or assoc excess/ purulent sputum or mucus plugs or hemoptysis or cp or chest tightness, subjective wheeze or overt sinus or hb symptoms.     Also denies any obvious fluctuation of symptoms with weather or environmental changes or other aggravating or alleviating factors except as outlined above   No unusual exposure hx or h/o childhood pna/ asthma or knowledge of premature birth.  Current Allergies, Complete Past Medical History, Past Surgical History, Family History, and Social History were reviewed in [employer] record.  ROS  The following are not active complaints unless bolded Hoarseness, sore throat, dysphagia, dental problems, itching, sneezing,  nasal congestion or discharge of excess mucus or purulent secretions, ear ache,   fever, chills, sweats, unintended wt loss or wt gain, classically pleuritic or exertional cp,  orthopnea pnd or arm/hand swelling  or leg swelling, presyncope,  palpitations, abdominal pain, anorexia, nausea, vomiting, diarrhea  or change in bowel habits or change in bladder habits, change in stools or change in urine, dysuria, hematuria,  rash, arthralgias, visual complaints, headache, numbness, weakness or ataxia or problems with walking or coordination,  change in mood or  memory.        Current Meds  Medication Sig  . bisoprolol (ZEBETA) 5 MG tablet Take 1 tablet (5 mg total) by mouth daily.  . cholecalciferol (VITAMIN D) 1000 UNITS tablet Take 2,000 Units by mouth daily.   . furosemide (LASIX) 20 MG tablet TAKE (2) TABLETS TWICE DAILY.  Marland Kitchen irbesartan (AVAPRO) 75 MG tablet Take 1 tablet (75 mg total) by mouth daily.  Marland Kitchen levalbuterol (XOPENEX HFA) 45 MCG/ACT inhaler Inhale up to 2 puffs every 4 hours as needed for shortness of breath or wheezing  . MAGNESIUM PO Take 1 tablet by mouth daily.  . meperidine (DEMEROL) 50 MG/5ML solution Take 5 mLs (50 mg total) by mouth every 4 (four) hours as needed.  . mometasone-formoterol (DULERA) 200-5 MCG/ACT AERO Take 2 puffs first thing in am and then another 2 puffs about 12 hours later.  . OXYGEN 3 L continuous.   . potassium chloride (KLOR-CON 10) 10 MEQ tablet Take 2 tablets (20 mEq total) by mouth daily.  . raNITIdine HCl (ACID REDUCER PO) Take 40 mg by mouth 2 (two) times daily. Uses Equate version of Prilosec. Patient unable to tolerate brand medication, it upset patient's stomach.  . Tiotropium Bromide Monohydrate (SPIRIVA RESPIMAT) 2.5 MCG/ACT AERS USE 2 PUFFS EVERY MORNING.  Marland Kitchen Zinc 30 MG CAPS Take 1 capsule by mouth daily.                 Objective:   Physical Exam   amb with 2 wheeled walker     Vital signs reviewed - Note on arrival 02 sats  100% on 3lpm continous      09/15/2013  113  > 10/27/2013   113 > 05/07/2014  111>  112 09/05/2014  > 10/25/2014  110 > 12/06/2014  110 > 03/07/2015  110> 06/04/2015  109 > 08/19/2015  105   > 11/18/2015    102 > 05/26/2016   96 >  11/30/2017  104 > 04/12/2018   109 > 06/07/2018  110     08/17/13 113 lb (51.256 kg)  07/18/13 114 lb 6.4 oz (51.891 kg)  06/21/13 112 lb (50.803 kg)   HEENT: nl dentition, turbinates bilaterally.. Nl external ear canals without cough reflex Cannot open mouth wide enough to examine anything but her tongue which appears nl    NECK :  without JVD/Nodes/TM/ nl carotid upstrokes bilaterally   LUNGS: no acc muscle use,  Nl contour chest which is clear to A and P bilaterally without cough on insp or exp maneuvers   CV:  RRR  no s3 or murmur or increase in P2, and no edema   ABD:  soft and nontender with nl inspiratory excursion in the supine position. No bruits or organomegaly appreciated, bowel sounds nl  MS:  Very slow gait with rolling walker/ ext warm with gen muscle wasting , calf tenderness, cyanosis or clubbing No obvious joint restrictions   SKIN: warm and dry without lesions    NEURO:  alert, approp, nl sensorium with  no motor or cerebellar deficits apparent.       I personally reviewed images and agree with radiology impression as follows:  04/18/18      Review of ventilatory images demonstrates relative homogeneous distribution of inhaled radiotracer with minimal, grossly symmetric, sparing of the bilateral lung apices. Ingested radiotracer is seen within the esophagus and stomach.  Perfusion: Perfusion images demonstrate homogeneous distribution of injected radiotracer without discrete segmental or subsegmental mismatched filling defect to suggest pulmonary embolism.  IMPRESSION: Pulmonary embolism absent (very low probability of pulmonary embolism  My impression:  Not c/w significant copd either        Assessment & Plan:

## 2018-06-07 NOTE — Patient Instructions (Addendum)
Work on maintaining perfect  inhaler technique:  relax and gently blow all the way out then take a nice smooth deep breath back in, triggering the inhaler at same time you start breathing in.  Hold for up to 5 seconds if you can. Blow dulera out thru nose. Rinse and gargle with water when done   For flare of breathing problems that don't respond to xopenex or persistent cough > Prednisone 10 mg take  4 each am x 2 days,   2 each am x 2 days,  1 each am x 2 days and stop   For nasty mucus > zpak   Please schedule a follow up visit in 3 months but call sooner if needed

## 2018-06-08 ENCOUNTER — Encounter: Payer: Self-pay | Admitting: Internal Medicine

## 2018-06-08 NOTE — Assessment & Plan Note (Signed)
ACEi d/c 08/18/13 >> marked improvement  MBS 10/29/2014  >> Diet Recommendations Dysphagia 1 (Puree);Thin liquid;Dysphagia 2  - Flare with perfume exp 07/2015 - Flare with dust exp - see ov 08/19/2015   For what ever reason her cough appears to be steroid responsive despite documented good technique on Dulera 200 2 puffs twice daily which should provide her the same amount of control as prednisone  if she uses it consistently.  This suggests to me that she has an upper airway component rx diet/ gerd rx  Reviewed.    I had an extended discussion with the patient reviewing all relevant studies completed to date and  lasting 15 to 20 minutes of a 25 minute visit    See device teaching which extended face to face time for this visit.  Each maintenance medication was reviewed in detail including emphasizing most importantly the difference between maintenance and prns and under what circumstances the prns are to be triggered using an action plan format that is not reflected in the computer generated alphabetically organized AVS which I have not found useful in most complex patients, especially with respiratory illnesses  Please see AVS for specific instructions unique to this visit that I personally wrote and verbalized to the the pt in detail and then reviewed with pt  by my nurse highlighting any  changes in therapy recommended at today's visit to their plan of care.

## 2018-06-08 NOTE — Assessment & Plan Note (Signed)
-   sp smoking cessation in 1989 - see f/v loops 08/01/13 c/w upper airway obstruction > refused to repeat off acei  - trial off acei 08/18/2013 > marked improvement - 09/15/2013   Walked RA x about 50 ft and stopped due to  Fatigue s sob or desat - Spiriva trial 09/15/2013 > improved 10/27/13 so continue - added flutter valve 09/05/2014  - 10/25/2014  added dulera 100 2bid based on reported improvement with prednisone> much better - requiring prn pred since 05/2015 s documentation it helps (refuses pfts)  - V/q 04/18/2018   wnl ( not typical of copd and neg for PE)   - 06/07/2018  After extensive coaching inhaler device,  effectiveness =    90%    She is back to baseline after an exacerbation related to severe coughing fits which I do not believe was an exacerbation of COPD at all but rather upper airway cough syndrome (see separate a/p)   I remain skeptical that she actually has much COPD at all but she is convinced that the inhalers are helping her and that she needs prednisone to get through her flares.  Since she is not willing to undergo any further studies I do not believe we have any choice but to continue the medications as they are.

## 2018-07-02 ENCOUNTER — Encounter: Payer: Self-pay | Admitting: Emergency Medicine

## 2018-07-02 ENCOUNTER — Other Ambulatory Visit: Payer: Self-pay

## 2018-07-02 ENCOUNTER — Emergency Department (INDEPENDENT_AMBULATORY_CARE_PROVIDER_SITE_OTHER)
Admission: EM | Admit: 2018-07-02 | Discharge: 2018-07-02 | Disposition: A | Discharge status: Home / Self Care | Payer: 59 | Source: Home / Self Care

## 2018-07-02 DIAGNOSIS — R140 Abdominal distension (gaseous): Secondary | ICD-10-CM | POA: Diagnosis not present

## 2018-07-02 DIAGNOSIS — R300 Dysuria: Secondary | ICD-10-CM | POA: Diagnosis not present

## 2018-07-02 DIAGNOSIS — K5900 Constipation, unspecified: Secondary | ICD-10-CM

## 2018-07-02 DIAGNOSIS — R531 Weakness: Secondary | ICD-10-CM

## 2018-07-02 DIAGNOSIS — J441 Chronic obstructive pulmonary disease with (acute) exacerbation: Secondary | ICD-10-CM | POA: Diagnosis not present

## 2018-07-02 LAB — POCT URINALYSIS DIP (MANUAL ENTRY)
Bilirubin, UA: NEGATIVE
Glucose, UA: NEGATIVE mg/dL
Ketones, POC UA: NEGATIVE mg/dL
Nitrite, UA: NEGATIVE
Protein Ur, POC: NEGATIVE mg/dL
Spec Grav, UA: 1.01 (ref 1.010–1.025)
Urobilinogen, UA: 0.2 E.U./dL
pH, UA: 6.5 (ref 5.0–8.0)

## 2018-07-02 LAB — BASIC METABOLIC PANEL
BUN: 23 mg/dL (ref 7–25)
CO2: 29 mmol/L (ref 20–32)
Calcium: 9.6 mg/dL (ref 8.6–10.4)
Chloride: 103 mmol/L (ref 98–110)
Creat: 0.57 mg/dL (ref 0.50–1.05)
Glucose, Bld: 96 mg/dL (ref 65–99)
Potassium: 3.9 mmol/L (ref 3.5–5.3)
Sodium: 142 mmol/L (ref 135–146)

## 2018-07-02 MED ORDER — SULFAMETHOXAZOLE-TRIMETHOPRIM 800-160 MG PO TABS
1.0000 | ORAL_TABLET | Freq: Two times a day (BID) | ORAL | 0 refills | Status: DC
Start: 2018-07-02 — End: 2018-09-07

## 2018-07-02 NOTE — Discharge Instructions (Addendum)
Cautiously try and increase your fluid intake.  Take the Bactrim 1 twice daily for possible UTI.  Culture is pending which will confirm whether or not there is an infection there.  Advise using a mild laxative such as MiraLAX or milk of magnesia or Dulcolax to move your bowels a little better.  Removing the pressure from the abdomen might help your urinary symptoms also.  If continuing to feel bad get rechecked whenever necessary.  CBC and urine culture are pending.  Once again I encourage you to get your flu and pneumonia vaccinations.  You can discuss with your pulmonologist if you desire, or just go somewhere and get I them.

## 2018-07-02 NOTE — ED Triage Notes (Signed)
Here with possible UTI . x3 dys ago; worse last night. Freq/urge/ dysuria and strong odor.

## 2018-07-02 NOTE — ED Provider Notes (Signed)
[facility]    CSN: [id number] Arrival date & time: 07/02/18  2703     History   Chief Complaint Chief Complaint  Patient presents with  . Urinary Tract Infection    HPI [name] is a 57 y.o. female.   HPI 76-year-old lady with COPD who is treated by Dr. [name].  She has to take prednisone every few weeks a taper course for her lungs.  That raises her sugars.  A week ago she started having difficulty urinating for a day or 2 with some dysuria, then is just persisted with dysuria and able to urinate but has a tough time.  She has been constipated.  Her lungs are always bad, uses home O2.  She has been disabled since her 4s.  She no longer smokes.  She did not get a flu shot, says that she just does not get them, she is scared of getting the flu from them.  Attempted to explain that briefly to her.  Not having fever or chills.  Has chronic left flank tenderness and pain from where she had a thoracentesis.  Says her sugars go up a little bit from the steroids, was 100 today. Past Medical History:  Diagnosis Date  . Acute liver failure   . Anemia   . Anorexia nervosa    h/o   . CAP (community acquired pneumonia)   . Cardiomyopathy (HCC)   . Difficult intubation    secondary to jaw muscles  . Fibromyalgia   . Fibromyalgia syndrome   . GERD (gastroesophageal reflux disease)   . IBS (irritable bowel syndrome)   . MS (multiple sclerosis) (HCC)   . PTSD (post-traumatic stress disorder)   . Sacral decubitus ulcer   . Shock circulatory (HCC)   . TMJ disease     Patient Active Problem List   Diagnosis Date Noted  . COPD GOLD 0 and refuses further testing 05/28/2016  . Upper airway cough syndrome 08/19/2015  . Dysphagia, pharyngoesophageal phase 10/29/2014  . Left-sided chest wall pain 05/07/2014  . DOE (dyspnea on exertion) 08/18/2013  . Palpitations 07/18/2013  . Chronic systolic CHF (congestive heart failure) (HCC) 08/30/2012  . Tracheostomy status (HCC)  07/07/2012  . Physical deconditioning 07/07/2012  . Dilated cardiomyopathy (HCC) 06/09/2012  . Malnutrition compromising bodily function (HCC) 06/09/2012  . H/O anorexia nervosa 06/09/2012  . Enterococcal UTI (pansensitive) 06/08/2012  . Acute encephalopathy, resolved 06/07/2012  . Anemia 06/07/2012  . Thrombocytopenia (HCC) 06/07/2012  . Coagulopathy (HCC) 06/07/2012  . Acute liver failure 06/07/2012  . Neuromuscular disorder: not fully defined.  06/07/2012    Past Surgical History:  Procedure Laterality Date  . BUNIONECTOMY    . CESAREAN SECTION    . DILATION AND CURETTAGE OF UTERUS  2007  . TRACHEOSTOMY  06/2012  . TUBAL LIGATION      OB History   No obstetric history on file.      Home Medications    Prior to Admission medications   Medication Sig Start Date End Date Taking? Authorizing Provider  azithromycin (ZITHROMAX) 250 MG tablet Take 2 on day one then 1 daily x 4 days 06/07/18   Nyoka Cowden, MD  bisoprolol (ZEBETA) 5 MG tablet Take 1 tablet (5 mg total) by mouth daily. 02/04/18   Laurey Morale, MD  cholecalciferol (VITAMIN D) 1000 UNITS tablet Take 2,000 Units by mouth daily.  06/20/12   Jeanella Craze, NP  furosemide (LASIX) 20 MG tablet TAKE (2) TABLETS TWICE  DAILY. 02/04/18   Laurey MoraleMcLean, Dalton S, MD  irbesartan (AVAPRO) 75 MG tablet Take 1 tablet (75 mg total) by mouth daily. 02/04/18   Laurey MoraleMcLean, Dalton S, MD  levalbuterol [facility](XOPENEX HFA) 45 MCG/ACT inhaler Inhale up to 2 puffs every 4 hours as needed for shortness of breath or wheezing 11/30/17   Nyoka CowdenWert, Michael B, MD  MAGNESIUM PO Take 1 tablet by mouth daily.    [provider]  meperidine (DEMEROL) 50 MG/5ML solution Take 5 mLs (50 mg total) by mouth every 4 (four) hours as needed. 04/12/18   Nyoka CowdenWert, Michael B, MD  mometasone-formoterol (DULERA) 200-5 MCG/ACT AERO Take 2 puffs first thing in am and then another 2 puffs about 12 hours later. 11/30/17   Nyoka CowdenWert, Michael B, MD  OXYGEN 3 L continuous.     [provider]  potassium chloride (KLOR-CON 10) 10 MEQ tablet Take 2 tablets (20 mEq total) by mouth daily. 02/04/18   Laurey MoraleMcLean, Dalton S, MD  predniSONE (DELTASONE) 10 MG tablet Take as directed 06/07/18   Nyoka CowdenWert, Michael B, MD  raNITIdine HCl (ACID REDUCER PO) Take 40 mg by mouth 2 (two) times daily. Uses Equate version of Prilosec. Patient unable to tolerate brand medication, it upset patient's stomach.    [provider]  sulfamethoxazole-trimethoprim (BACTRIM DS,SEPTRA DS) 800-160 MG tablet Take 1 tablet by mouth 2 (two) times daily. 07/02/18   Peyton NajjarHopper, Sie Formisano H, MD  Tiotropium Bromide Monohydrate (SPIRIVA RESPIMAT) 2.5 MCG/ACT AERS USE 2 PUFFS EVERY MORNING. 11/30/17   Nyoka CowdenWert, Michael B, MD  Zinc 30 MG CAPS Take 1 capsule by mouth daily.     [provider]    Family History Family History  Problem Relation Age of Onset  . Asthma Mother   . Uterine cancer Mother   . Skin cancer Mother   . Asthma Father   . Cancer Father        ? type  . Asthma Brother   . Asthma Sister   . Heart disease Maternal Uncle   . Heart disease Maternal Grandfather   . Thyroid cancer Brother   . Multiple sclerosis Brother   . Multiple sclerosis Other     Social History Social History   Tobacco Use  . Smoking status: Former Smoker    Packs/day: 1.00    Years: 11.00    Pack years: 11.00    Types: Cigarettes    Last attempt to quit: 07/07/1987    Years since quitting: 31.0  . Smokeless tobacco: Never Used  Substance Use Topics  . Alcohol use: No  . Drug use: No     Allergies   Scopolamine; Ivp dye [iodinated diagnostic agents]; Lisinopril; and Carvedilol   Review of Systems Review of Systems Constitutional: Feels weak and ill but always does to some degree. HEENT: Unremarkable Cardiovascular: History of CHF, limits her fluids, but doing satisfactory right now. Respiratory: Chronic shortness of breath Gastrointestinal: Feels bloated.  Constipated Genitourinary as above.  Does not  drink a lot of fluids because of her CHF. Dermatologic: Has some sores on her buttocks apparently but that is not which she is here for   Physical Exam Triage Vital Signs ED Triage Vitals  Enc Vitals Group     BP      Pulse      Resp      Temp      Temp src      SpO2      Weight      Height  Head Circumference      Peak Flow      Pain Score      Pain Loc      Pain Edu?      Excl. in GC?    No data found.  Updated Vital Signs BP 108/71 (BP Location: Right Arm)   Pulse 72   Temp 98.4 F (36.9 C) (Oral)   Ht 5\' 3"  (1.6 m)   Wt 49.4 kg   SpO2 100%   BMI 19.31 kg/m   Visual Acuity Right Eye Distance:   Left Eye Distance:   Bilateral Distance:    Right Eye Near:   Left Eye Near:    Bilateral Near:     Physical Exam No major acute distress.  HEENT: Hair is all shaved off.  TMs normal.  Throat a little dry.  No erythema.  Cannot open her mouth very wide so hard to get a very good look.  Neck supple without significant nodes.  Chest is clear to auscultation, poor air exchange.  Heart regular without murmur.  Abdomen she says feels distended.  She has a couple surgical scars from her PEG tube and C-sections.  Appears normal.  Soft without organomegaly or masses.  Minimal bowel sounds.  Tender on her left CVA area where she apparently had the thoracentesis.  Right CVA is nontender.  She has difficulty rolling to sit up because she does not want to put pressure on buttocks which have had some sores.  UC Treatments / Results  Labs (all labs ordered are listed, but only abnormal results are displayed) Labs Reviewed  POCT URINALYSIS DIP (MANUAL ENTRY) - Abnormal; Notable for the following components:      Result Value   Blood, UA moderate (*)    Leukocytes, UA Small (1+) (*)    All other components within normal limits  URINE CULTURE  BASIC METABOLIC PANEL  POCT CBC W AUTO DIFF ([facility])   Results for orders placed or performed during the hospital  encounter of 07/02/18  POCT urinalysis dipstick  Result Value Ref Range   Color, UA yellow yellow   Clarity, UA clear clear   Glucose, UA negative negative mg/dL   Bilirubin, UA negative negative   Ketones, POC UA negative negative mg/dL   Spec Grav, UA 1.610 9.604 - 1.025   Blood, UA moderate (A) negative   pH, UA 6.5 5.0 - 8.0   Protein Ur, POC negative negative mg/dL   Urobilinogen, UA 0.2 0.2 or 1.0 E.U./dL   Nitrite, UA Negative Negative   Leukocytes, UA Small (1+) (A) Negative     EKG None  Radiology No results found.  Procedures Procedures (including critical care time) None Medications Ordered in UC Medications - No data to display  Initial Impression / Assessment and Plan / UC Course  I have reviewed the triage vital signs and the nursing notes.  Pertinent labs & imaging results that were available during my care of the patient were reviewed by me and considered in my medical decision making (see chart for details).     Dysuria.  Urinalysis only has mild leukocytes.  However I will go ahead and give her a few days of antibiotics pending the culture.  Check a CBC and BMP which I can follow-up on later.  Nothing else definitively found. Final Clinical Impressions(s) / UC Diagnoses   Final diagnoses:  Dysuria  Weakness  COPD exacerbation (HCC)  Abdominal bloating  Constipation, unspecified constipation type  Discharge Instructions     Cautiously try and increase your fluid intake.  Take the Bactrim 1 twice daily for possible UTI.  Culture is pending which will confirm whether or not there is an infection there.  Advise using a mild laxative such as MiraLAX or milk of magnesia or Dulcolax to move your bowels a little better.  Removing the pressure from the abdomen might help your urinary symptoms also.  If continuing to feel bad get rechecked whenever necessary.  CBC and urine culture are pending.  Once again I encourage you to get your flu and  pneumonia vaccinations.  You can discuss with your pulmonologist if you desire, or just go somewhere and get I them.    ED Prescriptions    Medication Sig Dispense Auth. Provider   sulfamethoxazole-trimethoprim (BACTRIM DS,SEPTRA DS) 800-160 MG tablet Take 1 tablet by mouth 2 (two) times daily. 10 tablet Peyton Najjar, MD     Controlled Substance Prescriptions [place] Controlled Substance Registry consulted? No   Peyton Najjar, MD 07/02/18 1037

## 2018-07-03 ENCOUNTER — Telehealth: Payer: Self-pay | Admitting: Emergency Medicine

## 2018-07-03 LAB — POCT CBC W AUTO DIFF (K'VILLE URGENT CARE)

## 2018-07-03 NOTE — Telephone Encounter (Signed)
Advised patient bloodwork normal; urine cx still pending. We will notify when results return.

## 2018-07-05 ENCOUNTER — Telehealth: Payer: Self-pay | Admitting: Emergency Medicine

## 2018-07-05 LAB — URINE CULTURE
MICRO NUMBER:: 91549075
SPECIMEN QUALITY:: ADEQUATE

## 2018-07-05 NOTE — Telephone Encounter (Signed)
She is getting better, culture pos for bacteria.

## 2018-09-06 ENCOUNTER — Ambulatory Visit: Payer: 59 | Admitting: Internal Medicine

## 2018-09-07 ENCOUNTER — Ambulatory Visit (INDEPENDENT_AMBULATORY_CARE_PROVIDER_SITE_OTHER): Payer: 59 | Admitting: Internal Medicine

## 2018-09-07 ENCOUNTER — Encounter: Payer: Self-pay | Admitting: Internal Medicine

## 2018-09-07 VITALS — BP 122/76 | HR 71 | Ht 63.0 in | Wt 111.0 lb

## 2018-09-07 DIAGNOSIS — J449 Chronic obstructive pulmonary disease, unspecified: Secondary | ICD-10-CM

## 2018-09-07 DIAGNOSIS — R05 Cough: Secondary | ICD-10-CM | POA: Diagnosis not present

## 2018-09-07 DIAGNOSIS — R058 Other specified cough: Secondary | ICD-10-CM

## 2018-09-07 MED ORDER — MOMETASONE FURO-FORMOTEROL FUM 200-5 MCG/ACT IN AERO: INHALATION_SPRAY | RESPIRATORY_TRACT | 11 refills | Status: DC

## 2018-09-07 MED ORDER — TIOTROPIUM BROMIDE MONOHYDRATE 2.5 MCG/ACT IN AERS: INHALATION_SPRAY | RESPIRATORY_TRACT | 11 refills | Status: DC

## 2018-09-07 NOTE — Patient Instructions (Signed)
No change in medications   Please schedule a follow up visit in 4 months but call sooner if needed  

## 2018-09-07 NOTE — Progress Notes (Signed)
Subjective:   Patient ID: [name], female    DOB: 29-Sep-1960   MRN: [id number]    Brief patient profile:  57 yowf quit smoking 1989 then ? MS dx'd early 40's by [facility] (though pt states flatly she doesn't have MS)  and "it's been steadily downhill since" mostly muscle related but since 2012 also with breathing difficulty > admit:   Admit date: 06/06/2012  Discharge date: 06/20/2012  Discharge Diagnoses:  Acute respiratory failure  CAP (community acquired pneumonia) vs aspiration pneumonia  Acute encephalopathy, resolved  Neuromuscular disorder: not fully defined.  Anemia  Thrombocytopenia  Coagulopathy  Acute liver failure  Enterococcal UTI (pansensitive)  Shock circulatory, suspect primarily cardiogenic  Sacral Pressure Ulcer  Dilated cardiomyopathy  Malnutrition compromising bodily function  H/O anorexia nervosa  Dysphagia    History of Present Illness  08/17/2013 1st [facility] office visit/ [name] was able do some adl/s with walker and no 02 summer of 2014  then downhill since  And now sob at rest  X 2 weeks and able to lie down at 45 degrees and can't tol lying flat at all for sev weeks assoc with dry cough day and night and dysphagia/ choking sensation on acei.  Already on gerd rx/ inhaler no benefit.  rec Stop lisinopril Avapro 75 mg one daily  Prilosec 20 mg Take 30- 60 min before your first and last meals of the day  GERD diet     09/15/2013 f/u ov/Cherylann Hobday re: acei vs copd  Chief Complaint  Patient presents with  . Follow-up    Pt states that her SOB/cough has improved greatly.  Pt still c/o SOB but is much better since lv.    limited more by fatigue and weakness than sob, swallowing fine now  rec spiriva 2puffs each am to see if your activity tolerance improves> improved so maint rx    09/05/2014 acute ov/Unique Searfoss re: worse sob/increase need for saba on spiriva maint rx  Chief Complaint  Patient presents with  . Acute Visit    Pt c/o increased  SOB- started mid Nov 2015, worse for the past month. She is using xopenex 2-3 x per day on average. She c/o cough for the past month- sometimes prod with clear yellow/green sputum. She has had some occ bloody nasal d/c.  Has not been able to lie down for several years, sitting upright in hosp bed due to sob Swallowing worse "because cough is worse" despite  on prilosec 20 bid with meals  Denies choking but all foods are pureed. rec Change omeprazole to where you take 20mg  x 2 Take 30- 60 min before your first and last meals of the day  GERD   Try flutter valve as needed for cough or congestion > ok to cough into the valve if helps    10/25/2014 f/u ov/Lynell Greenhouse re: mild copd/ MS with chronic dysphagia/ off pred x 21 days prior to OV   Chief Complaint  Patient presents with  . Acute Visit    Pt c/o decreased o2 sats since 09/26/14- feels fatigued and more SOB. She states that she is also aspirating more and got choked drinking water recently.   while on prednisone did not use any xopenex hfa but prior was using it up 3 x daily  On prednisone cough also goes away  But worse in am's without it. Thinks she may be aspirating more since Nov 2015 >   Convinced her copd is progressing based on her readings  Mucus is minimal, no food in it rec Please see patient coordinator before you leave today  to schedule overnight oximetry> neg   modified barium swallow>MBS 10/29/2014  Diet Recommendations Dysphagia 1 (Puree);Thin liquid;Dysphagia 2  (Fine chop)  Liquid Administration via Cup  Medication Administration Crushed with puree  Compensations Slow rate;Small sips/bites;Multiple dry swallows  after each bite/sip  Postural Changes and/or Swallow Maneuvers Seated upright 90  degrees;Chin tuck Add automatically Dulera 100 Take 2 puffs first thing in am and then another 2 puffs about 12 hours later.  Plan B = backup  Only use your levoalbuterol  If needed  Cough into the flutter valve if having trouble  getting anything up       06/04/2015  f/u ov/Ameli Sangiovanni re:  Probable copd on spiriva and dulera 100 2bid Chief Complaint  Patient presents with  . Follow-up    Pt c/o increased SOB for the 3 wks. She states that she coughs more when she talks and produces clear sputum.  She has been using her o2 more, and xopenex 1-2 x per wk.   Uses 02 daytime prn  Never uses at hs  Not seeing neuro any more / "the only reason my muscles get worse is that my breathing gets worse" swallowing no change. Prednisone really helps breathing  rec Increase dulera to 200 Take 2 puffs first thing in am and then another 2 puffs about 12 hours later.  02 2lpm at bedtime and during the day goal is  to keep above 90% so ok to adjust up down or off to achieve that goal Prednisone 10 mg take  4 each am x 2 days,   2 each am x 2 days,  1 each am x 2 days and stop   07/12/15 requested pred rx p perfume exp     04/12/2018 acute extended ov/Jalien Weakland QQ:IWLNL sob  since late August 2019  Chief Complaint  Patient presents with  . Acute Visit    She states her breathing is not improving since the last visit. She is wheezing some. She states "there is a lack of expansion in my lungs". She is using her xopenex inhaler every morning to help with wheezing.   usual pattern  cough/ wheeze tried prednisone late Aug 2019 this time no better, some better at least for four hours p xopenex hfa never more than a few times a week but did not respond to prednisone and in fact worse then hemoptysis/cp w/in a few days of ov with NP 03/30/18 who rec Continue prednisone taper Continue xopenex, dulera, and spiriva Zpak and increase 02 if needed to 3lpm > smidge better  starting 10 d prior to OV using xopenex with am dulera / spiriva and increased 02 to 3lpm and restarted prednisone 3 d prior to OV   Cp varies posteriorly = on both sides not really pleuritic but p coughing fits feels sore rec Only use your Levoalbuterol as a rescue medication  Work on  inhaler technique:    Please see patient coordinator before you leave today  to schedule CTa of chest > changed to v/q due to contrast allergy  Take delsym two tsp every 12 hours and supplement if needed with  Demerol 50 mg up to 1  every 4 hours to suppress the urge to cough.  Once you have eliminated the cough for 3 straight days try reducing the demerol  first,  then the delsym as tolerated.      06/07/2018  f/u  ov/Antanisha Mohs re: copd  GOLD 0 / uacs  Chief Complaint  Patient presents with  . Follow-up    Breathing has improved some. She states still does not have much stamina. She has not needed her xopenex inhaler.   Dyspnea:   15 ft and stops/ 02 helps despite absence of desats documented  Cough: better, does seem to respond to prednisone - restarts every 2-3 weeks  X 6 days desptite dulera 200 /spiriva  Sleeping: 90 degrees hosp bed  SABA use: none  02: 3lpm 24/7   Nasal discharge better p zpak  No more cp or hemoptysis  rec Work on maintaining perfect  inhaler technique:  relax and gently blow all the way out then take a nice smooth deep breath back in, triggering the inhaler at same time you start breathing in.  Hold for up to 5 seconds if you can. Blow dulera out thru nose. Rinse and gargle with water when done  For flare of breathing problems that don't respond to xopenex or persistent cough > Prednisone 10 mg take  4 each am x 2 days,   2 each am x 2 days,  1 each am x 2 days and stop  For nasty mucus > zpak  Please schedule a follow up visit in 3 months but call sooner if needed     09/07/2018  f/u ov/Lyrika Souders re: GOLD 0 copd/ uacs  maint on spiriva/dulera 200  Chief Complaint  Patient presents with  . Follow-up    Pt c/o chest tightness since just this afternoon. She has not had to use her xopenex since the last visit.   Dyspnea:  20 ft even with 02 with sats never < 90% on RA ever documented (bought her own 02) Cough: no/ some discolored nasal drainage  Sleeping: 90 degrees  SABA  use: none 02: 3 lpm 24/7  One episode of chest tightness when outside on day of ov > xopenex hfa resolved    No obvious day to day or daytime variability or assoc excess/ purulent sputum or mucus plugs or hemoptysis or cp  , subjective wheeze or overt sinus or hb symptoms.    . Also denies any obvious fluctuation of symptoms with weather or environmental changes or other aggravating or alleviating factors except as outlined above   No unusual exposure hx or h/o childhood pna/ asthma or knowledge of premature birth.  Current Allergies, Complete Past Medical History, Past Surgical History, Family History, and Social History were reviewed in [employer] record.  ROS  The following are not active complaints unless bolded Hoarseness, sore throat, dysphagia, dental problems, itching, sneezing,  nasal congestion or discharge of excess mucus or purulent secretions, ear ache,   fever, chills, sweats, unintended wt loss or wt gain, classically pleuritic or exertional cp,  orthopnea pnd or arm/hand swelling  or leg swelling, presyncope, palpitations, abdominal pain, anorexia, nausea, vomiting, diarrhea  or change in bowel habits or change in bladder habits, change in stools or change in urine, dysuria, hematuria,  rash, arthralgias, visual complaints, headache, numbness, weakness or ataxia or problems with walking or coordination,  change in mood or  memory.        Current Meds  Medication Sig  . bisoprolol (ZEBETA) 5 MG tablet Take 1 tablet (5 mg total) by mouth daily.  . cholecalciferol (VITAMIN D) 1000 UNITS tablet Take 2,000 Units by mouth daily.   . furosemide (LASIX) 20 MG tablet TAKE (2) TABLETS TWICE DAILY.  Marland Kitchen  irbesartan (AVAPRO) 75 MG tablet Take 1 tablet (75 mg total) by mouth daily.  Marland Kitchen levalbuterol (XOPENEX HFA) 45 MCG/ACT inhaler Inhale up to 2 puffs every 4 hours as needed for shortness of breath or wheezing  . MAGNESIUM PO Take 1 tablet by mouth daily.  . meperidine  (DEMEROL) 50 MG/5ML solution Take 5 mLs (50 mg total) by mouth every 4 (four) hours as needed.  . mometasone-formoterol (DULERA) 200-5 MCG/ACT AERO Take 2 puffs first thing in am and then another 2 puffs about 12 hours later.  . OXYGEN 3 L continuous.   . potassium chloride (KLOR-CON 10) 10 MEQ tablet Take 2 tablets (20 mEq total) by mouth daily.  . raNITIdine HCl (ACID REDUCER PO) Take 40 mg by mouth 2 (two) times daily. Uses Equate version of Prilosec. Patient unable to tolerate brand medication, it upset patient's stomach.  . Tiotropium Bromide Monohydrate (SPIRIVA RESPIMAT) 2.5 MCG/ACT AERS USE 2 PUFFS EVERY MORNING.  Marland Kitchen Zinc 30 MG CAPS Take 1 capsule by mouth daily.                  Objective:   Physical Exam   chronically ill appearing middle aged wf arrived using rollator   Vital signs reviewed - Note on arrival 02 sats  98% on 3lpm      09/15/2013    113  > 10/27/2013   113 > 05/07/2014  111>  112 09/05/2014  > 10/25/2014  110 > 12/06/2014  110 > 03/07/2015  110> 06/04/2015  109 > 08/19/2015  105   > 11/18/2015    102 > 05/26/2016   96 >  11/30/2017  104 > 04/12/2018  109 > 06/07/2018  110 > 09/07/2018    111     08/17/13 113 lb (51.256 kg)  07/18/13 114 lb 6.4 oz (51.891 kg)  06/21/13 112 lb (50.803 kg)      HEENT: nl turbinates bilaterally . Nl external ear canals without cough reflex/ cannot open mouth wide enough to examine anything but her tongue which appears nl    NECK :  without JVD/Nodes/TM/ nl carotid upstrokes bilaterally   LUNGS: no acc muscle use,  Nl contour chest which is clear to A and P bilaterally without cough on insp or exp maneuvers   CV:  RRR  no s3 or murmur or increase in P2, and no edema   ABD:  soft and nontender with nl inspiratory excursion in the supine position. No bruits or organomegaly appreciated, bowel sounds nl  MS:  Very slow gait with rollator,  ext warm with diffuse muscle atrophy,  no  calf tenderness, cyanosis or clubbing No obvious joint  restrictions   SKIN: warm and dry without lesions    NEURO:  alert, approp, nl sensorium with  no obvious motor or cerebellar deficits apparent.            Assessment & Plan:

## 2018-09-08 ENCOUNTER — Encounter: Payer: Self-pay | Admitting: Internal Medicine

## 2018-09-08 NOTE — Assessment & Plan Note (Signed)
Quit smoking 1989 - see f/v loops 08/01/13 c/w upper airway obstruction > refused to repeat off acei  - trial off acei 08/18/2013 > marked improvement - 09/15/2013   Walked RA x about 50 ft and stopped due to  Fatigue s sob or desat - Spiriva trial 09/15/2013 > improved 10/27/13 so continue - added flutter valve 09/05/2014  - 10/25/2014  added dulera 100 2bid based on reported improvement with prednisone> much better - requiring prn pred since 05/2015 s documentation it helps (refuses pfts)  - V/q 04/18/2018   wnl ( not typical of copd and neg for PE)   . 09/07/2018  After extensive coaching inhaler device,  effectiveness =    90%     Adequate control on present rx, reviewed in detail with pt > no change in rx needed - unwilling to repeat pfts to sort out why sob x 20 ft but suspect this is all deconditioning at this point.

## 2018-09-09 ENCOUNTER — Telehealth: Payer: Self-pay | Admitting: Internal Medicine

## 2018-09-09 NOTE — Telephone Encounter (Signed)
Spoke with the pt  She is asking for a mask from our office  I advised she needs to try and find one from a retail store  We are not allowed to supply pt's with masks unless here for an ov and they are having symptoms  I reminded her that the masks are used to help prevent the spread of infection, and will most likely not protect her from illness  She verbalized understanding  Nothing further needed

## 2018-10-03 ENCOUNTER — Encounter (HOSPITAL_COMMUNITY): Payer: 59 | Admitting: Cardiology

## 2018-12-08 ENCOUNTER — Telehealth (HOSPITAL_COMMUNITY): Payer: Self-pay | Admitting: Cardiology

## 2018-12-08 NOTE — Telephone Encounter (Signed)
Patient returned my call, aware appt cancelled and is agreeable to be added to waitlist

## 2018-12-08 NOTE — Telephone Encounter (Signed)
Called and left message for patient advising d/t Covid restrictions we are cancelling the appt for 12/13/2018 with Dr. Shirlee Latch and adding her to the waitlist.  Advised pt to call the Clinic if she has any problems and choose the option 2 to leave a message for the Nurse.

## 2018-12-13 ENCOUNTER — Encounter (HOSPITAL_COMMUNITY): Payer: 59 | Admitting: Cardiology

## 2019-01-09 ENCOUNTER — Encounter: Payer: Self-pay | Admitting: Internal Medicine

## 2019-01-09 ENCOUNTER — Ambulatory Visit (INDEPENDENT_AMBULATORY_CARE_PROVIDER_SITE_OTHER): Payer: 59 | Admitting: Internal Medicine

## 2019-01-09 ENCOUNTER — Other Ambulatory Visit: Payer: Self-pay

## 2019-01-09 DIAGNOSIS — J449 Chronic obstructive pulmonary disease, unspecified: Secondary | ICD-10-CM

## 2019-01-09 NOTE — Patient Instructions (Signed)
For discolored / bloody or nasty mucus > zpak  For wheezing / short of breath, increased need for xopenex > Prednisone 10 mg take  4 each am x 2 days,   2 each am x 2 days,  1 each am x 2 days and stop    Please schedule a follow up visit in 3 months but call sooner if needed

## 2019-01-09 NOTE — Progress Notes (Signed)
Subjective:   Patient ID: [name], female    DOB: 29-Sep-1960   MRN: [id number]    Brief patient profile:  57 yowf quit smoking 1989 then ? MS dx'd early 40's by D[facility] (though pt states flatly she doesn't have MS)  and "it's been steadily downhill since" mostly muscle related but since 2012 also with breathing difficulty > admit:   Admit date: 06/06/2012  Discharge date: 06/20/2012  Discharge Diagnoses:  Acute respiratory failure  CAP (community acquired pneumonia) vs aspiration pneumonia  Acute encephalopathy, resolved  Neuromuscular disorder: not fully defined.  Anemia  Thrombocytopenia  Coagulopathy  Acute liver failure  Enterococcal UTI (pansensitive)  Shock circulatory, suspect primarily cardiogenic  Sacral Pressure Ulcer  Dilated cardiomyopathy  Malnutrition compromising bodily function  H/O anorexia nervosa  Dysphagia    History of Present Illness  08/17/2013 1st [facility] office visit/ [name] was able do some adl/s with walker and no 02 summer of 2014  then downhill since  And now sob at rest  X 2 weeks and able to lie down at 45 degrees and can't tol lying flat at all for sev weeks assoc with dry cough day and night and dysphagia/ choking sensation on acei.  Already on gerd rx/ inhaler no benefit.  rec Stop lisinopril Avapro 75 mg one daily  Prilosec 20 mg Take 30- 60 min before your first and last meals of the day  GERD diet     09/15/2013 f/u ov/Nikcole Eischeid re: acei vs copd  Chief Complaint  Patient presents with  . Follow-up    Pt states that her SOB/cough has improved greatly.  Pt still c/o SOB but is much better since lv.    limited more by fatigue and weakness than sob, swallowing fine now  rec spiriva 2puffs each am to see if your activity tolerance improves> improved so maint rx    09/05/2014 acute ov/Vershawn Westrup re: worse sob/increase need for saba on spiriva maint rx  Chief Complaint  Patient presents with  . Acute Visit    Pt c/o increased  SOB- started mid Nov 2015, worse for the past month. She is using xopenex 2-3 x per day on average. She c/o cough for the past month- sometimes prod with clear yellow/green sputum. She has had some occ bloody nasal d/c.  Has not been able to lie down for several years, sitting upright in hosp bed due to sob Swallowing worse "because cough is worse" despite  on prilosec 20 bid with meals  Denies choking but all foods are pureed. rec Change omeprazole to where you take 20mg  x 2 Take 30- 60 min before your first and last meals of the day  GERD   Try flutter valve as needed for cough or congestion > ok to cough into the valve if helps    10/25/2014 f/u ov/Aemilia Dedrick re: mild copd/ MS with chronic dysphagia/ off pred x 21 days prior to OV   Chief Complaint  Patient presents with  . Acute Visit    Pt c/o decreased o2 sats since 09/26/14- feels fatigued and more SOB. She states that she is also aspirating more and got choked drinking water recently.   while on prednisone did not use any xopenex hfa but prior was using it up 3 x daily  On prednisone cough also goes away  But worse in am's without it. Thinks she may be aspirating more since Nov 2015 >   Convinced her copd is progressing based on her readings  Mucus is minimal, no food in it rec Please see patient coordinator before you leave today  to schedule overnight oximetry> neg   modified barium swallow>MBS 10/29/2014  Diet Recommendations Dysphagia 1 (Puree);Thin liquid;Dysphagia 2  (Fine chop)  Liquid Administration via Cup  Medication Administration Crushed with puree  Compensations Slow rate;Small sips/bites;Multiple dry swallows  after each bite/sip  Postural Changes and/or Swallow Maneuvers Seated upright 90  degrees;Chin tuck Add automatically Dulera 100 Take 2 puffs first thing in am and then another 2 puffs about 12 hours later.  Plan B = backup  Only use your levoalbuterol  If needed  Cough into the flutter valve if having trouble  getting anything up       06/04/2015  f/u ov/Lucas Exline re:  Probable copd on spiriva and dulera 100 2bid Chief Complaint  Patient presents with  . Follow-up    Pt c/o increased SOB for the 3 wks. She states that she coughs more when she talks and produces clear sputum.  She has been using her o2 more, and xopenex 1-2 x per wk.   Uses 02 daytime prn  Never uses at hs  Not seeing neuro any more / "the only reason my muscles get worse is that my breathing gets worse" swallowing no change. Prednisone really helps breathing  rec Increase dulera to 200 Take 2 puffs first thing in am and then another 2 puffs about 12 hours later.  02 2lpm at bedtime and during the day goal is  to keep above 90% so ok to adjust up down or off to achieve that goal Prednisone 10 mg take  4 each am x 2 days,   2 each am x 2 days,  1 each am x 2 days and stop   07/12/15 requested pred rx p perfume exp     04/12/2018 acute extended ov/Isabella Roemmich QQ:I[facility]NL sob  since late August 2019  Chief Complaint  Patient presents with  . Acute Visit    She states her breathing is not improving since the last visit. She is wheezing some. She states "there is a lack of expansion in my lungs". She is using her xopenex inhaler every morning to help with wheezing.   usual pattern  cough/ wheeze tried prednisone late Aug 2019 this time no better, some better at least for four hours p xopenex hfa never more than a few times a week but did not respond to prednisone and in fact worse then hemoptysis/cp w/in a few days of ov with NP 03/30/18 who rec Continue prednisone taper Continue xopenex, dulera, and spiriva Zpak and increase 02 if needed to 3lpm > smidge better  starting 10 d prior to OV using xopenex with am dulera / spiriva and increased 02 to 3lpm and restarted prednisone 3 d prior to OV   Cp varies posteriorly = on both sides not really pleuritic but p coughing fits feels sore rec Only use your Levoalbuterol as a rescue medication  Work on  inhaler technique:    Please see patient coordinator before you leave today  to schedule CTa of chest > changed to v/q due to contrast allergy  Take delsym two tsp every 12 hours and supplement if needed with  Demerol 50 mg up to 1  every 4 hours to suppress the urge to cough.  Once you have eliminated the cough for 3 straight days try reducing the demerol  first,  then the delsym as tolerated.      06/07/2018  f/u  ov/Bobbi Yount re: copd  GOLD 0 / uacs  Chief Complaint  Patient presents with  . Follow-up    Breathing has improved some. She states still does not have much stamina. She has not needed her xopenex inhaler.   Dyspnea:   15 ft and stops/ 02 helps despite absence of desats documented  Cough: better, does seem to respond to prednisone - restarts every 2-3 weeks  X 6 days desptite dulera 200 /spiriva  Sleeping: 90 degrees hosp bed  SABA use: none  02: 3lpm 24/7   Nasal discharge better p zpak  No more cp or hemoptysis  rec Work on maintaining perfect  inhaler technique:  relax and gently blow all the way out then take a nice smooth deep breath back in, triggering the inhaler at same time you start breathing in.  Hold for up to 5 seconds if you can. Blow dulera out thru nose. Rinse and gargle with water when done  For flare of breathing problems that don't respond to xopenex or persistent cough > Prednisone 10 mg take  4 each am x 2 days,   2 each am x 2 days,  1 each am x 2 days and stop  For nasty mucus > zpak  Please schedule a follow up visit in 3 months but call sooner if needed     01/09/2019  f/u ov/Daxson Reffett re: GOLD 0/ copd maint empirical = duler 200/ spiriva Chief Complaint  Patient presents with  . Follow-up    Prod cough and nasal d/c- green in color for the past month. She is using her xopenex inhaler once per wk on average.   Dyspnea:  No change doe = MMRC3 = can't walk 100 yards even at a slow pace at a flat grade s stopping due to sob  Even on 3lpm  Cough: first thing in  am or if talks alot Sleeping: "90 degrees"  = baseline, says can't lie back due to sob  SABA use: as above 02: 3lpm 24/7    No obvious day to day or daytime variability or assoc   mucus plugs or hemoptysis or cp or chest tightness, subjective wheeze or overt sinus or hb symptoms.     Also denies any obvious fluctuation of symptoms with weather or environmental changes or other aggravating or alleviating factors except as outlined above   No unusual exposure hx or h/o childhood pna/ asthma or knowledge of premature birth.  Current Allergies, Complete Past Medical History, Past Surgical History, Family History, and Social History were reviewed in [employer] record.  ROS  The following are not active complaints unless bolded Hoarseness, sore throat, dysphagia, dental problems, itching, sneezing,  nasal congestion or discharge of excess mucus or purulent secretions, ear ache,   fever, chills, sweats, unintended wt loss or wt gain, classically pleuritic or exertional cp,  orthopnea pnd or arm/hand swelling  or leg swelling, presyncope, palpitations, abdominal pain, anorexia, nausea, vomiting, diarrhea  or change in bowel habits or change in bladder habits, change in stools or change in urine, dysuria, hematuria,  rash, arthralgias, visual complaints, headache, numbness, weakness or ataxia or problems with walking or coordination,  change in mood or  memory.        Current Meds  Medication Sig  . bisoprolol (ZEBETA) 5 MG tablet Take 1 tablet (5 mg total) by mouth daily.  . cholecalciferol (VITAMIN D) 1000 UNITS tablet Take 2,000 Units by mouth daily.   . furosemide (LASIX) 20 MG  tablet TAKE (2) TABLETS TWICE DAILY.  Marland Kitchen irbesartan (AVAPRO) 75 MG tablet Take 1 tablet (75 mg total) by mouth daily.  Marland Kitchen levalbuterol (XOPENEX HFA) 45 MCG/ACT inhaler Inhale up to 2 puffs every 4 hours as needed for shortness of breath or wheezing  . MAGNESIUM PO Take 1 tablet by mouth daily.  .  meperidine (DEMEROL) 50 MG/5ML solution Take 5 mLs (50 mg total) by mouth every 4 (four) hours as needed.  . mometasone-formoterol (DULERA) 200-5 MCG/ACT AERO Take 2 puffs first thing in am and then another 2 puffs about 12 hours later.  . OXYGEN 3 L continuous.   . potassium chloride (KLOR-CON 10) 10 MEQ tablet Take 2 tablets (20 mEq total) by mouth daily.  . raNITIdine HCl (ACID REDUCER PO) Take 40 mg by mouth 2 (two) times daily. Uses Equate version of Prilosec. Patient unable to tolerate brand medication, it upset patient's stomach.  . Tiotropium Bromide Monohydrate (SPIRIVA RESPIMAT) 2.5 MCG/ACT AERS USE 2 PUFFS EVERY MORNING.  Marland Kitchen Zinc 30 MG CAPS Take 1 capsule by mouth daily.                   Objective:   Physical Exam  Chronically ill wf > stated age/  using rolling walker and 02 cart pushes with both hands   Vital signs reviewed - Note on arrival 02 sats  99% on 3lpm  Continuous       09/15/2013    113  > 10/27/2013   113 > 05/07/2014  111>  112 09/05/2014  > 10/25/2014  110 > 12/06/2014  110 > 03/07/2015  110> 06/04/2015  109 > 08/19/2015  105   > 11/18/2015    102 > 05/26/2016   96 >  11/30/2017  104 > 04/12/2018  109 > 06/07/2018  110 > 09/07/2018    111 > 01/09/2019   112     08/17/13 113 lb (51.256 kg)  07/18/13 114 lb 6.4 oz (51.891 kg)  06/21/13 112 lb (50.803 kg)     . HEENT: can't open mouth for exam.  Nl external ear canals without cough reflex -  Mild bilateral non-specific turbinate edema     NECK :  without JVD/Nodes/TM/ nl carotid upstrokes bilaterally   LUNGS: no acc muscle use,  Min barrel  contour chest wall with bilateral  slightly decreased bs s audible wheeze and  without cough on insp or exp maneuver and min  Hyperresonant  to  percussion bilaterally     CV:  RRR  no s3 or murmur or increase in P2, and no edema   ABD:  soft and nontender   No bruits or organomegaly appreciated, bowel sounds nl  MS:     ext warm without deformities, calf tenderness, cyanosis or  clubbing No obvious joint restrictions   SKIN: warm and dry without lesions    NEURO:  alert, approp, nl sensorium with  no motor or cerebellar deficits apparent.              Assessment & Plan:

## 2019-01-10 ENCOUNTER — Encounter: Payer: Self-pay | Admitting: Internal Medicine

## 2019-01-10 NOTE — Assessment & Plan Note (Signed)
Quit smoking 1989 - see f/v loops 08/01/13 c/w upper airway obstruction > refused to repeat off acei  - trial off acei 08/18/2013 > marked improvement - 09/15/2013   Walked RA x about 50 ft and stopped due to  Fatigue s sob or desat - Spiriva trial 09/15/2013 > improved 10/27/13 so continue - added flutter valve 09/05/2014  - 10/25/2014  added dulera 100 2bid based on reported improvement with prednisone> much better - requiring prn pred since 05/2015 s documentation it helps (refuses pfts)  - V/q 04/18/2018   wnl ( not typical of copd and neg for PE)  . 09/07/2018  After extensive coaching inhaler device,  effectiveness =    90%    Mild flare in setting of rhinitis with purulent sputum now - reminded the zpak is prn purulent sputum and the pred for increased sob/ neeed for saba.  Each maintenance medication was reviewed in detail including most importantly the difference between maintenance and as needed and under what circumstances the prns are to be used.  Please see AVS for specific  Instructions which are unique to this visit and I personally typed out  which were reviewed in detail in writing with the patient and a copy provided.

## 2019-01-18 ENCOUNTER — Other Ambulatory Visit (HOSPITAL_COMMUNITY): Payer: Self-pay | Admitting: Cardiology

## 2019-01-18 DIAGNOSIS — R600 Localized edema: Secondary | ICD-10-CM

## 2019-01-18 DIAGNOSIS — I5022 Chronic systolic (congestive) heart failure: Secondary | ICD-10-CM

## 2019-01-18 DIAGNOSIS — R0602 Shortness of breath: Secondary | ICD-10-CM

## 2019-01-18 DIAGNOSIS — R1900 Intra-abdominal and pelvic swelling, mass and lump, unspecified site: Secondary | ICD-10-CM

## 2019-01-18 MED ORDER — POTASSIUM CHLORIDE ER 10 MEQ PO TBCR: 20.0000 meq | EXTENDED_RELEASE_TABLET | Freq: Every day | ORAL | 11 refills | Status: DC

## 2019-01-18 MED ORDER — BISOPROLOL FUMARATE 5 MG PO TABS: 5.0000 mg | ORAL_TABLET | Freq: Every day | ORAL | 11 refills | Status: DC

## 2019-01-18 MED ORDER — FUROSEMIDE 20 MG PO TABS: ORAL_TABLET | ORAL | 11 refills | Status: DC

## 2019-01-18 MED ORDER — IRBESARTAN 75 MG PO TABS: 75.0000 mg | ORAL_TABLET | Freq: Every day | ORAL | 11 refills | Status: DC

## 2019-02-03 ENCOUNTER — Telehealth: Payer: Self-pay | Admitting: Internal Medicine

## 2019-02-03 NOTE — Telephone Encounter (Signed)
[phone].  Pt returned call.

## 2019-02-03 NOTE — Telephone Encounter (Signed)
Spoke with pt, she states  The 2 tabs in the morning and 2 tabs in the evening of omeprazole because she is having breakthrough episodes. She is unable to tolerate the prescription omeprazole and uses the over the counter medication. She wanted to know if she should do 3 in the am and then 3 in the afternoon to help with the breakthrough episode. She states MW usually tells her what to do because the 2/2 is not working. MW please advise.

## 2019-02-03 NOTE — Telephone Encounter (Signed)
ATC, NA and no option to leave msg 

## 2019-02-03 NOTE — Telephone Encounter (Signed)
Spoke with the pt and notified of recs per MW  She verbalized understanding  Nothing further needed 

## 2019-02-03 NOTE — Telephone Encounter (Signed)
Should not exceed ppi 40 mg po bid ac plus bepcid 20 mg qs plus the diet / bed blocks 6-8 inches under head of bed   The best supplement I know of for this she can try is gaviscon liquid 1-2 tbsp prn or see a GI doctor or defer to PCP

## 2019-02-15 ENCOUNTER — Other Ambulatory Visit: Payer: Self-pay | Admitting: Family Medicine

## 2019-02-15 DIAGNOSIS — M810 Age-related osteoporosis without current pathological fracture: Secondary | ICD-10-CM

## 2019-02-15 DIAGNOSIS — Z1231 Encounter for screening mammogram for malignant neoplasm of breast: Secondary | ICD-10-CM

## 2019-03-22 ENCOUNTER — Other Ambulatory Visit: Payer: Self-pay

## 2019-03-22 ENCOUNTER — Ambulatory Visit (HOSPITAL_COMMUNITY)
Admission: RE | Admit: 2019-03-22 | Discharge: 2019-03-22 | Disposition: A | Discharge status: Home / Self Care | Payer: 59 | Source: Ambulatory Visit | Attending: Cardiology | Admitting: Cardiology

## 2019-03-22 VITALS — BP 122/76 | HR 77 | Wt 112.0 lb

## 2019-03-22 DIAGNOSIS — M797 Fibromyalgia: Secondary | ICD-10-CM | POA: Insufficient documentation

## 2019-03-22 DIAGNOSIS — I5042 Chronic combined systolic (congestive) and diastolic (congestive) heart failure: Secondary | ICD-10-CM | POA: Diagnosis not present

## 2019-03-22 DIAGNOSIS — F4310 Post-traumatic stress disorder, unspecified: Secondary | ICD-10-CM | POA: Insufficient documentation

## 2019-03-22 DIAGNOSIS — R0600 Dyspnea, unspecified: Secondary | ICD-10-CM | POA: Insufficient documentation

## 2019-03-22 DIAGNOSIS — D649 Anemia, unspecified: Secondary | ICD-10-CM | POA: Insufficient documentation

## 2019-03-22 DIAGNOSIS — J449 Chronic obstructive pulmonary disease, unspecified: Secondary | ICD-10-CM | POA: Diagnosis not present

## 2019-03-22 DIAGNOSIS — I429 Cardiomyopathy, unspecified: Secondary | ICD-10-CM | POA: Insufficient documentation

## 2019-03-22 DIAGNOSIS — I5043 Acute on chronic combined systolic (congestive) and diastolic (congestive) heart failure: Secondary | ICD-10-CM

## 2019-03-22 DIAGNOSIS — Z87891 Personal history of nicotine dependence: Secondary | ICD-10-CM | POA: Diagnosis not present

## 2019-03-22 DIAGNOSIS — I5022 Chronic systolic (congestive) heart failure: Secondary | ICD-10-CM | POA: Diagnosis not present

## 2019-03-22 DIAGNOSIS — Z79899 Other long term (current) drug therapy: Secondary | ICD-10-CM | POA: Insufficient documentation

## 2019-03-22 DIAGNOSIS — R002 Palpitations: Secondary | ICD-10-CM | POA: Diagnosis not present

## 2019-03-22 LAB — BASIC METABOLIC PANEL
Anion gap: 10 (ref 5–15)
BUN: 21 mg/dL — ABNORMAL HIGH (ref 6–20)
CO2: 25 mmol/L (ref 22–32)
Calcium: 9.4 mg/dL (ref 8.9–10.3)
Chloride: 103 mmol/L (ref 98–111)
Creatinine, Ser: 0.48 mg/dL (ref 0.44–1.00)
GFR calc Af Amer: >60 mL/min (ref >60)
GFR calc non Af Amer: >60 mL/min (ref >60)
Glucose, Bld: 95 mg/dL (ref 70–99)
Potassium: 3.7 mmol/L (ref 3.5–5.1)
Sodium: 138 mmol/L (ref 135–145)

## 2019-03-22 LAB — BRAIN NATRIURETIC PEPTIDE: B Natriuretic Peptide: 21.2 pg/mL (ref 0.0–100.0)

## 2019-03-22 NOTE — Progress Notes (Signed)
Patient ID: [name], female   DOB: 1960/07/31, 58 y.o.   MRN: [id number] PCP: Dr. Corliss Blacker Cardiology: Dr. Shirlee Latch  58 y.o. with complicated past medical history presents for evaluation of her cardiomyopathy.  She has a long history of neuromuscular disorder.  The exact diagnosis remains undetermined.  She has been confined to a wheelchair or a walker.  In 12/13, she was admitted to [facility] with hypotension, hypothermia, and altered mental status.   She was found to have profound anemia with hemoglobin down to 3.  She was transfused 4 units.  She also was found to have Enterococcal UTI and Serratia PNA.  She was intubated and eventually required tracheostomy.  During her hospitalization, she was found to have a cardiomyopathy with EF 20-25% by echo.  She was treated with milrinone.  Repeat echo in 5/14 showed EF improved to 50-55% with abnormal septal motion and last echo in 9/14 showed EF 55-60%.    Previously lasix was increased to 40 mg in am and 20 mg in pm along with 20 meq of potassium. I offered her a RHC however she declined. PFTs obtained as noted below and showed severe airway obstruction with subsequent evaluation by Dr [name].  At that time Dr [name] switched lisinopril to irbesartan. He felt that despite severity of her COPD, main limitation was her neuromuscular condition. Event monitor was worn in 1/15 due to tachypalpitations.  This showed PACs.  Event monitor was worn again in 12/17, this again showed PACs with short runs of atrial tachycardia.   She returns for followup of CHF and palpitations.  She continues to have periodic exacerbations of COPD/asthma for which she is given steroids.  She wears home oxygen.  Last echo in 7/18 was normal.  She wore an event monitor in 2/19 because of palpitations and had to take it off because of allergic reaction, no significant arrhythmias in the short period that she wore the monitor.  She seems more or less stable symptomatically compared to the past.   She has her baseline shortness of breath if she walks more than about 50-100 feet.  She is using her walker more than the wheelchair now.  No chest pain.  Rare lightheadedness with standing. A couple of month ago, she had 2 episodes where she felt like her feet were swelling and she got more short of breath.  She took extra Lasix (40 mg bid) for several days each time with resolution of episodes.  Weight is stable compared to last appt.   ECG (personally reviewed): NSR, iRBBB   Labs (1/14): K 3.9, creatinine 0.21, HCT 30 Labs (4/14): K 4, creatinine 0.4, BNP 19 Labs (10/14): K 3.5, creatinine 0.5 Labs (12/14): K 3.6, creatinine 0.4 Labs (07/28/12): K 3.5 creatinine 0.5 Labs (08/01/13): K 3.5 Creatinine 0.5 Pro BNP 129 Labs (2/16): K 3.8, creatinine 0.44 Labs (9/16): K 3.6, creatinine 0.52, BNP 14 Labs (11/17): K 4.6, creatinine 0.53, BNP 16 Labs (5/18): K 3.7, creatinine 0.53 Labs (1/19): K 3.7, creatinine 0.56 Labs (12/19): K 3.9, creatinine 0.57  PMH: 1. Anorexia/cachexia/failure to thrive: Patient had a PEG tube.  2. Neuromuscular disease: exact diagnosis is still undetermined despite extensive neurology evaluation.  She does not have multiple sclerosis.  3. Fibromyalgia 4. IBS 5. TMJ syndrome 6. PTSD 7. GERD 8. Sacral decubitus ulcer 9. H/o aspiration 10. 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  Echo (12/13) with EF 20-25% with diffuse hypokinesis, mild MR, moderate RV dilation and  moderate RV dysfunction.  Echo (5/14) with EF 50-55%, abnormal septal motion.  Echo (9/14) with EF 55-60%.   - Echo (5/17): EF 60-65%.  - Echo (7/18): EF 60-65%, normal RV.  11. Anemia 12. COPD/asthma: PFTs 08/01/13 with FEV1 0.98 liters (33%), FVC 1.87 (50%), FEV 1/FVC 52%, FEF 25-75% 0.65 liter.  - She is on home oxygen.  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 Palpitations: Event monitor in 12/17  showed PACs and short runs of atrial tachycardia.  - 2/19 event monitor: She only wore it for a few days.  No significant arrhythmias.   SH: Lives with husband in [place].  Quit smoking > 20 years ago.  No ETOH.     FH: No history of cardiomyopathy.  Grandfather had MI at 71.   ROS: All systems reviewed and negative except as per HPI.    Current Outpatient Medications  Medication Sig Dispense Refill  . acetaminophen (TYLENOL) 325 MG tablet Take 650 mg by mouth every 6 (six) hours as needed.    Marland Kitchen azithromycin (ZITHROMAX) 250 MG tablet Take 250 mg by mouth as needed (for copd flare up).    . bisoprolol (ZEBETA) 5 MG tablet Take 1 tablet (5 mg total) by mouth daily. 30 tablet 11  . furosemide (LASIX) 20 MG tablet TAKE (2) TABLETS TWICE DAILY. 120 tablet 11  . irbesartan (AVAPRO) 75 MG tablet Take 1 tablet (75 mg total) by mouth daily. 30 tablet 11  . levalbuterol (XOPENEX HFA) 45 MCG/ACT inhaler Inhale up to 2 puffs every 4 hours as needed for shortness of breath or wheezing 1 Inhaler 5  . MAGNESIUM PO Take 1 tablet by mouth daily.    . meperidine (DEMEROL) 50 MG/5ML solution Take 5 mLs (50 mg total) by mouth every 4 (four) hours as needed. 150 mL 0  . mometasone-formoterol (DULERA) 200-5 MCG/ACT AERO Take 2 puffs first thing in am and then another 2 puffs about 12 hours later. 1 Inhaler 11  . OXYGEN 3 L continuous.     . potassium chloride (KLOR-CON 10) 10 MEQ tablet Take 2 tablets (20 mEq total) by mouth daily. 60 tablet 11  . predniSONE (DELTASONE) 10 MG tablet Take 10 mg by mouth as needed (for copd flare up).    . raNITIdine HCl (ACID REDUCER PO) Take 40 mg by mouth 2 (two) times daily. Uses Equate version of Prilosec. Patient unable to tolerate brand medication, it upset patient's stomach.    . Tiotropium Bromide Monohydrate (SPIRIVA RESPIMAT) 2.5 MCG/ACT AERS USE 2 PUFFS EVERY MORNING. 4 g 11  . Zinc 30 MG CAPS Take 1 capsule by mouth daily.      No current facility-administered  medications for this encounter.     BP 122/76   Pulse 77   Wt 50.8 kg (112 lb)   SpO2 100% Comment: 3L  BMI 19.84 kg/m  General: NAD Neck: No JVD, no thyromegaly or thyroid nodule.  Lungs: Clear to auscultation bilaterally with normal respiratory effort. CV: Nondisplaced PMI.  Heart regular S1/S2, no S3/S4, no murmur.  No peripheral edema.  No carotid bruit.  Normal pedal pulses.  Abdomen: Soft, nontender, no hepatosplenomegaly, no distention.  Skin: Intact without lesions or rashes.  Neurologic: Alert and oriented x 3.  Psych: Normal affect. Extremities: No clubbing or cyanosis.  HEENT: Normal.   Assessment/Plan: 1. Cardiomyopathy: Last echo in 7/18 was normal.  I suspect that her prior low EF was due to a  stress (Takotsubo-type) cardiomyopathy.  She is not volume overloaded on exam though she reports a couple of episodes in the last few months where she felt like she got volume overloaded and took Lasix.  She has chronic NYHA class III symptoms, but think this may be due more to COPD/asthma. - She will continue bisoprolol, irbesartan, and Lasix all at current doses.   - BMET/BNP today.  - I will arrange for repeat echo to make sure that EF has not declined again.   2. Dyspnea: NYHA class III symptoms, chronic. Suspect combination of COPD/asthma and deconditioning due to neuromuscular disorder.  As above, doubt active CHF.  She is followed closely by Dr Melvyn Novas. She is wearing oxygen now at all times.  She has periodic flares where she will take prednisone.  3. Neuromuscular disorder: Not fully characterized.  She is in a wheelchair for longer distances but can walk shorter distances with a walker. I have offered to refer her back to neurology in the past but she declined.   Followup in 6 months.    Joyanna Kleman,MD 03/22/2019

## 2019-03-22 NOTE — Patient Instructions (Signed)
Labs were done today. We will ONLY contact you if something is ABNORMAL. NO CALL IS A GOOD CALL!!!  EKG was done today.  Your Physician would like you to come in for an ECHOCARDIOGRAM in 1 month.  Your Physician would like you to follow up with the Clinic in 6 months. The office will give you a call to schedule this in a few months.  At the [facility], you and your health needs are our priority. As part of our continuing mission to provide you with exceptional heart care, we have created designated Provider Care Teams. These Care Teams include your primary Cardiologist (physician) and Advanced Practice Providers (APPs- Physician Assistants and Nurse Practitioners) who all work together to provide you with the care you need, when you need it.   You may see any of the following providers on your designated Care Team at your next follow up: Marland Kitchen Dr Glori Bickers . Dr Loralie Champagne . Darrick Grinder, NP   Please be sure to bring in all your medications bottles to every appointment.

## 2019-04-11 ENCOUNTER — Ambulatory Visit: Payer: 59 | Admitting: Internal Medicine

## 2019-04-20 ENCOUNTER — Ambulatory Visit
Admission: RE | Admit: 2019-04-20 | Discharge: 2019-04-20 | Disposition: A | Discharge status: Home / Self Care | Payer: 59 | Source: Ambulatory Visit | Attending: Family Medicine | Admitting: Family Medicine

## 2019-04-20 ENCOUNTER — Other Ambulatory Visit: Payer: Self-pay

## 2019-04-20 DIAGNOSIS — Z1231 Encounter for screening mammogram for malignant neoplasm of breast: Secondary | ICD-10-CM

## 2019-04-20 DIAGNOSIS — M810 Age-related osteoporosis without current pathological fracture: Secondary | ICD-10-CM

## 2019-04-28 ENCOUNTER — Other Ambulatory Visit (HOSPITAL_COMMUNITY): Payer: 59

## 2019-05-02 ENCOUNTER — Other Ambulatory Visit: Payer: Self-pay

## 2019-05-02 ENCOUNTER — Ambulatory Visit (HOSPITAL_COMMUNITY)
Admission: RE | Admit: 2019-05-02 | Discharge: 2019-05-02 | Disposition: A | Discharge status: Home / Self Care | Payer: 59 | Source: Ambulatory Visit | Attending: Cardiology | Admitting: Cardiology

## 2019-05-02 DIAGNOSIS — I5042 Chronic combined systolic (congestive) and diastolic (congestive) heart failure: Secondary | ICD-10-CM | POA: Diagnosis present

## 2019-05-02 DIAGNOSIS — I083 Combined rheumatic disorders of mitral, aortic and tricuspid valves: Secondary | ICD-10-CM | POA: Diagnosis not present

## 2019-05-02 DIAGNOSIS — I429 Cardiomyopathy, unspecified: Secondary | ICD-10-CM | POA: Diagnosis not present

## 2019-05-02 DIAGNOSIS — J449 Chronic obstructive pulmonary disease, unspecified: Secondary | ICD-10-CM | POA: Diagnosis not present

## 2019-05-02 NOTE — Progress Notes (Signed)
  Echocardiogram 2D Echocardiogram has been performed.  [name] 05/02/2019, 3:48 PM

## 2019-05-03 ENCOUNTER — Telehealth (HOSPITAL_COMMUNITY): Payer: Self-pay

## 2019-05-03 NOTE — Telephone Encounter (Signed)
-----   Message from Larey Dresser, MD sent at 05/02/2019 10:46 PM EDT ----- EF 60-65%, normal RV

## 2019-05-03 NOTE — Telephone Encounter (Signed)
Pt aware of results of echo and appreciative.  

## 2019-05-12 ENCOUNTER — Encounter: Payer: Self-pay | Admitting: Internal Medicine

## 2019-05-12 ENCOUNTER — Ambulatory Visit (INDEPENDENT_AMBULATORY_CARE_PROVIDER_SITE_OTHER): Payer: 59 | Admitting: Internal Medicine

## 2019-05-12 ENCOUNTER — Other Ambulatory Visit: Payer: Self-pay

## 2019-05-12 DIAGNOSIS — R0600 Dyspnea, unspecified: Secondary | ICD-10-CM | POA: Diagnosis not present

## 2019-05-12 DIAGNOSIS — J449 Chronic obstructive pulmonary disease, unspecified: Secondary | ICD-10-CM

## 2019-05-12 DIAGNOSIS — R0609 Other forms of dyspnea: Secondary | ICD-10-CM

## 2019-05-12 MED ORDER — PREDNISONE 10 MG PO TABS: 10.0000 mg | ORAL_TABLET | ORAL | 2 refills | Status: DC | PRN

## 2019-05-12 MED ORDER — LEVALBUTEROL TARTRATE 45 MCG/ACT IN AERO: INHALATION_SPRAY | RESPIRATORY_TRACT | 5 refills | Status: DC

## 2019-05-12 MED ORDER — AZITHROMYCIN 250 MG PO TABS: 250.0000 mg | ORAL_TABLET | ORAL | 11 refills | Status: DC | PRN

## 2019-05-12 NOTE — Patient Instructions (Signed)
No change in your medications.     Please schedule a follow up visit in 6 months but call sooner if needed  

## 2019-05-12 NOTE — Assessment & Plan Note (Signed)
smoking cessation in 1989 - see f/v loops 08/01/13 c/w upper airway obstruction > refused to repeat off acei  - trial off acei 08/18/2013 > marked improvement - 09/15/2013   Walked RA x about 50 ft and stopped due to  Fatigue s sob or desat - Spiriva trial 09/15/2013 > improved 10/27/13 so continue - added flutter valve 09/05/2014  - 10/25/2014  added dulera 100 2bid based on reported improvement with prednisone> much better  - ono RA 10/25/2014 > neg for desats   - MBS 10/25/2014 > see dysphagia - 05/2015  Started 6 day cycles of empirical  prednisone  For flares  - 08/19/2015  p extensive coaching HFA effectiveness =    75% and has trouble with hand strength limiting  - reported improved on 02 11/18/2015 (purchased on her own)  Multifactorial but stabilized on rx as copd gold 0/ 02 improving sense of sob and she has purchased it on her own so no changes needed.  rec continue to be as active as possible  Over the winter f/u prn during pandemic with televisits prn   >>>> f/u 6 m   I had an extended discussion with the patient reviewing all relevant studies completed to date and  lasting 15 to 20 minutes of a 25 minute visit    Each maintenance medication was reviewed in detail including most importantly the difference between maintenance and prns and under what circumstances the prns are to be triggered using an action plan format that is not reflected in the computer generated alphabetically organized AVS.     Please see AVS for specific instructions unique to this visit that I personally wrote and verbalized to the the pt in detail and then reviewed with pt  by my nurse highlighting any  changes in therapy recommended at today's visit to their plan of care.

## 2019-05-12 NOTE — Progress Notes (Signed)
Subjective:   Patient ID: [name], female    DOB: [birth year]   MRN: [id number]    Brief patient profile:  58 yowf quit smoking 1989 then ? MS dx'd early 33's by D[facility]meir (though pt states flatly she doesn't have MS)  and "it's been steadily downhill since" mostly muscle related but since 2012 also with breathing difficulty > admit:   Admit date: 06/06/2012  Discharge date: 06/20/2012  Discharge Diagnoses:  Acute respiratory failure  CAP (community acquired pneumonia) vs aspiration pneumonia  Acute encephalopathy, resolved  Neuromuscular disorder: not fully defined.  Anemia  Thrombocytopenia  Coagulopathy  Acute liver failure  Enterococcal UTI (pansensitive)  Shock circulatory, suspect primarily cardiogenic  Sacral Pressure Ulcer  Dilated cardiomyopathy  Malnutrition compromising bodily function  H/O anorexia nervosa  Dysphagia    History of Present Illness  08/17/2013 1st [facility] office visit/ [name] was able do some adl/s with walker and no 02 summer of 2014  then downhill since  And now sob at rest  X 2 weeks and able to lie down at 45 degrees and can't tol lying flat at all for sev weeks assoc with dry cough day and night and dysphagia/ choking sensation on acei.  Already on gerd rx/ inhaler no benefit.  rec Stop lisinopril Avapro 75 mg one daily  Prilosec 20 mg Take 30- 60 min before your first and last meals of the day  GERD diet     09/15/2013 f/u ov/[name] re: acei vs copd  Chief Complaint  Patient presents with  . Follow-up    Pt states that her SOB/cough has improved greatly.  Pt still c/o SOB but is much better since lv.    limited more by fatigue and weakness than sob, swallowing fine now  rec spiriva 2puffs each am to see if your activity tolerance improves> improved so maint rx    09/05/2014 acute ov/[name] re: worse sob/increase need for saba on spiriva maint rx  Chief Complaint  Patient presents with  . Acute Visit    Pt c/o increased  SOB- started mid Nov 2015, worse for the past month. She is using xopenex 2-3 x per day on average. She c/o cough for the past month- sometimes prod with clear yellow/green sputum. She has had some occ bloody nasal d/c.  Has not been able to lie down for several years, sitting upright in hosp bed due to sob Swallowing worse "because cough is worse" despite  on prilosec 20 bid with meals  Denies choking but all foods are pureed. rec Change omeprazole to where you take 20mg  x 2 Take 30- 60 min before your first and last meals of the day  GERD   Try flutter valve as needed for cough or congestion > ok to cough into the valve if helps    10/25/2014 f/u ov/[name] re: mild copd/ MS with chronic dysphagia/ off pred x 21 days prior to OV   Chief Complaint  Patient presents with  . Acute Visit    Pt c/o decreased o2 sats since 09/26/14- feels fatigued and more SOB. She states that she is also aspirating more and got choked drinking water recently.   while on prednisone did not use any xopenex hfa but prior was using it up 3 x daily  On prednisone cough also goes away  But worse in am's without it. Thinks she may be aspirating more since Nov 2015 >   Convinced her copd is progressing based on her readings  Mucus is minimal, no food in it rec Please see patient coordinator before you leave today  to schedule overnight oximetry> neg   modified barium swallow>MBS 10/29/2014  Diet Recommendations Dysphagia 1 (Puree);Thin liquid;Dysphagia 2  (Fine chop)  Liquid Administration via Cup  Medication Administration Crushed with puree  Compensations Slow rate;Small sips/bites;Multiple dry swallows  after each bite/sip  Postural Changes and/or Swallow Maneuvers Seated upright 90  degrees;Chin tuck Add automatically Dulera 100 Take 2 puffs first thing in am and then another 2 puffs about 12 hours later.  Plan B = backup  Only use your levoalbuterol  If needed  Cough into the flutter valve if having trouble  getting anything up       06/04/2015  f/u ov/[name] re:  Probable copd on spiriva and dulera 100 2bid Chief Complaint  Patient presents with  . Follow-up    Pt c/o increased SOB for the 3 wks. She states that she coughs more when she talks and produces clear sputum.  She has been using her o2 more, and xopenex 1-2 x per wk.   Uses 02 daytime prn  Never uses at hs  Not seeing neuro any more / "the only reason my muscles get worse is that my breathing gets worse" swallowing no change. Prednisone really helps breathing  rec Increase dulera to 200 Take 2 puffs first thing in am and then another 2 puffs about 12 hours later.  02 2lpm at bedtime and during the day goal is  to keep above 90% so ok to adjust up down or off to achieve that goal Prednisone 10 mg take  4 each am x 2 days,   2 each am x 2 days,  1 each am x 2 days and stop   07/12/15 requested pred rx p perfume exp     04/12/2018 acute extended ov/[name] QQ:IWLNL sob  since late August 2019  Chief Complaint  Patient presents with  . Acute Visit    She states her breathing is not improving since the last visit. She is wheezing some. She states "there is a lack of expansion in my lungs". She is using her xopenex inhaler every morning to help with wheezing.   usual pattern  cough/ wheeze tried prednisone late Aug 2019 this time no better, some better at least for four hours p xopenex hfa never more than a few times a week but did not respond to prednisone and in fact worse then hemoptysis/cp w/in a few days of ov with NP 03/30/18 who rec Continue prednisone taper Continue xopenex, dulera, and spiriva Zpak and increase 02 if needed to 3lpm > smidge better  starting 10 d prior to OV using xopenex with am dulera / spiriva and increased 02 to 3lpm and restarted prednisone 3 d prior to OV   Cp varies posteriorly = on both sides not really pleuritic but p coughing fits feels sore rec Only use your Levoalbuterol as a rescue medication  Work on  inhaler technique:    Please see patient coordinator before you leave today  to schedule CTa of chest > changed to v/q due to contrast allergy  Take delsym two tsp every 12 hours and supplement if needed with  Demerol 50 mg up to 1  every 4 hours to suppress the urge to cough.  Once you have eliminated the cough for 3 straight days try reducing the demerol  first,  then the delsym as tolerated.      06/07/2018  f/u  ov/[name] re: copd  GOLD 0 / uacs  Chief Complaint  Patient presents with  . Follow-up    Breathing has improved some. She states still does not have much stamina. She has not needed her xopenex inhaler.   Dyspnea:   15 ft and stops/ 02 helps despite absence of desats documented  Cough: better, does seem to respond to prednisone - restarts every 2-3 weeks  X 6 days desptite dulera 200 /spiriva  Sleeping: 90 degrees hosp bed  SABA use: none  02: 3lpm 24/7   Nasal discharge better p zpak  No more cp or hemoptysis  rec Work on maintaining perfect  inhaler technique:  relax and gently blow all the way out then take a nice smooth deep breath back in, triggering the inhaler at same time you start breathing in.  Hold for up to 5 seconds if you can. Blow dulera out thru nose. Rinse and gargle with water when done  For flare of breathing problems that don't respond to xopenex or persistent cough > Prednisone 10 mg take  4 each am x 2 days,   2 each am x 2 days,  1 each am x 2 days and stop  For nasty mucus > zpak  Please schedule a follow up visit in 3 months but call sooner if needed     01/09/2019  f/u ov/[name] re: GOLD 0/ copd maint empirical = duler 200/ spiriva Chief Complaint  Patient presents with  . Follow-up    Prod cough and nasal d/c- green in color for the past month. She is using her xopenex inhaler once per wk on average.   Dyspnea:  No change doe = MMRC3 = can't walk 100 yards even at a slow pace at a flat grade s stopping due to sob  Even on 3lpm  Cough: first thing in  am or if talks alot Sleeping: "90 degrees"  = baseline, says can't lie back due to sob  SABA use: as above 02: 3lpm 24/7  rec For discolored / bloody or nasty mucus > zpak For wheezing / short of breath, increased need for xopenex > Prednisone 10 mg take  4 each am x 2 days,   2 each am x 2 days,  1 each am x 2 days and stop    05/12/2019  f/u ov/[name] re:  GOLD 0 / maint dulera 200/ spiriva and 02 pt purchased as did not qualify  Chief Complaint  Patient presents with  . Follow-up    Overall doing well but having minimal chest tightness and wheezing. She has used her xopenex inhaler a few times recently.   Dyspnea:  Walking across house then stop , to mb and back stop half way Cough: variable - flares self rx with short course pred./ zpak successfully / not recently  Sleeping: baseline 90 SABA use: rarely 02: 3lpm 24/7    No obvious day to day or daytime variability or assoc excess/ purulent sputum or mucus plugs or hemoptysis or cp or  overt sinus or hb symptoms.   Sleeping as above  without nocturnal  or early am exacerbation  of respiratory  c/o's or need for noct saba. Also denies any obvious fluctuation of symptoms with weather or environmental changes or other aggravating or alleviating factors except as outlined above   No unusual exposure hx or h/o childhood pna/ asthma or knowledge of premature birth.  Current Allergies, Complete Past Medical History, Past Surgical History, Family History, and Social History  were reviewed in [facility] electronic medical record.  ROS  The following are not active complaints unless bolded Hoarseness, sore throat, dysphagia, dental problems, itching, sneezing,  nasal congestion or discharge of excess mucus or purulent secretions, ear ache,   fever, chills, sweats, unintended wt loss or wt gain, classically pleuritic or exertional cp,  orthopnea pnd or arm/hand swelling  or leg swelling, presyncope, palpitations, abdominal pain, anorexia,  nausea, vomiting, diarrhea  or change in bowel habits or change in bladder habits, change in stools or change in urine, dysuria, hematuria,  rash, arthralgias, visual complaints, headache, numbness, weakness or ataxia or problems with walking uses rolling walker and carrier for 02 two separate devises or coordination,  change in mood or  memory.        Current Meds  Medication Sig  . acetaminophen (TYLENOL) 325 MG tablet Take 650 mg by mouth every 6 (six) hours as needed.  . bisoprolol (ZEBETA) 5 MG tablet Take 1 tablet (5 mg total) by mouth daily.  . furosemide (LASIX) 20 MG tablet TAKE (2) TABLETS TWICE DAILY.  Marland Kitchen irbesartan (AVAPRO) 75 MG tablet Take 1 tablet (75 mg total) by mouth daily.  Marland Kitchen levalbuterol (XOPENEX HFA) 45 MCG/ACT inhaler Inhale up to 2 puffs every 4 hours as needed for shortness of breath or wheezing  . MAGNESIUM PO Take 1 tablet by mouth daily.  . meperidine (DEMEROL) 50 MG/5ML solution Take 5 mLs (50 mg total) by mouth every 4 (four) hours as needed.  . mometasone-formoterol (DULERA) 200-5 MCG/ACT AERO Take 2 puffs first thing in am and then another 2 puffs about 12 hours later.  . OXYGEN 3 L continuous.   . potassium chloride (KLOR-CON 10) 10 MEQ tablet Take 2 tablets (20 mEq total) by mouth daily.  . Tiotropium Bromide Monohydrate (SPIRIVA RESPIMAT) 2.5 MCG/ACT AERS USE 2 PUFFS EVERY MORNING.  Marland Kitchen Zinc 30 MG CAPS Take 1 capsule by mouth daily.                   Objective:   Physical Exam   chronically ill but pleasant thin wf nad/ walks  With walker  05/12/2019    113   01/09/2019      112     08/17/13 113 lb (51.256 kg)  07/18/13 114 lb 6.4 oz (51.891 kg)  06/21/13 112 lb (50.803 kg)       BP 114/68 (BP Location: Left Arm, Cuff Size: Normal)   Pulse 76   Temp 97.9 F (36.6 C) (Temporal)   SpO2 100% Comment: on 3lpm cont o2     HEENT : pt wearing mask not removed for exam due to covid - 19 concerns.    NECK :  without JVD/Nodes/TM/ nl carotid  upstrokes bilaterally   LUNGS: no acc muscle use,  Mild barrel  contour chest wall with bilateral  Distant bs s audible wheeze and  without cough on insp or exp maneuvers  and mild  Hyperresonant  to  percussion bilaterally     CV:  RRR  no s3 or murmur or increase in P2, and no edema   ABD:  soft and nontender with pos end  insp Hoover's  in the supine position. No bruits or organomegaly appreciated, bowel sounds nl  MS:   Slow but steady gait/ ext warm without deformities, calf tenderness, cyanosis or clubbing No obvious joint restrictions   SKIN: warm and dry without lesions    NEURO:  alert, approp, nl sensorium with  no motor or cerebellar deficits apparent.         Assessment & Plan:

## 2019-05-12 NOTE — Assessment & Plan Note (Signed)
Quit smoking 1989 - see f/v loops 08/01/13 c/w upper airway obstruction > refused to repeat off acei  - trial off acei 08/18/2013 > marked improvement - 09/15/2013   Walked RA x about 50 ft and stopped due to  Fatigue s sob or desat - Spiriva trial 09/15/2013 > improved 10/27/13 so continue - added flutter valve 09/05/2014  - 10/25/2014  added dulera 100 2bid based on reported improvement with prednisone> much better - requiring prn pred since 05/2015 s documentation it helps (refuses pfts)  - V/q 04/18/2018   wnl ( not typical of copd and neg for PE)  . 09/07/2018  After extensive coaching inhaler device,  effectiveness =    90%   Group D in terms of symptom/risk and laba/lama/ICS  therefore appropriate rx at this point >>>  Continue dulera/ spiriva with prn zpak/pred thru winter  Pt informed of the seriousness of COVID 19 infection as a direct risk to their health  and safey and to those of their loved ones and should continue to wear facemask in public and minimize exposure to public locations but especially avoid any area or activity where non-close contacts are not observing distancing or wearing an appropriate face mask.

## 2019-09-14 ENCOUNTER — Other Ambulatory Visit: Payer: Self-pay | Admitting: Internal Medicine

## 2019-11-03 ENCOUNTER — Other Ambulatory Visit: Payer: Self-pay

## 2019-11-03 ENCOUNTER — Encounter (HOSPITAL_COMMUNITY): Payer: Self-pay | Admitting: Cardiology

## 2019-11-03 ENCOUNTER — Ambulatory Visit (HOSPITAL_COMMUNITY)
Admission: RE | Admit: 2019-11-03 | Discharge: 2019-11-03 | Disposition: A | Discharge status: Home / Self Care | Payer: 59 | Source: Ambulatory Visit | Attending: Cardiology | Admitting: Cardiology

## 2019-11-03 VITALS — BP 94/58 | HR 87

## 2019-11-03 DIAGNOSIS — R1900 Intra-abdominal and pelvic swelling, mass and lump, unspecified site: Secondary | ICD-10-CM

## 2019-11-03 DIAGNOSIS — Z7951 Long term (current) use of inhaled steroids: Secondary | ICD-10-CM | POA: Diagnosis not present

## 2019-11-03 DIAGNOSIS — K589 Irritable bowel syndrome without diarrhea: Secondary | ICD-10-CM | POA: Diagnosis not present

## 2019-11-03 DIAGNOSIS — Z79899 Other long term (current) drug therapy: Secondary | ICD-10-CM | POA: Insufficient documentation

## 2019-11-03 DIAGNOSIS — I428 Other cardiomyopathies: Secondary | ICD-10-CM | POA: Insufficient documentation

## 2019-11-03 DIAGNOSIS — I5022 Chronic systolic (congestive) heart failure: Secondary | ICD-10-CM

## 2019-11-03 DIAGNOSIS — Z8249 Family history of ischemic heart disease and other diseases of the circulatory system: Secondary | ICD-10-CM | POA: Diagnosis not present

## 2019-11-03 DIAGNOSIS — Z9981 Dependence on supplemental oxygen: Secondary | ICD-10-CM | POA: Insufficient documentation

## 2019-11-03 DIAGNOSIS — M797 Fibromyalgia: Secondary | ICD-10-CM | POA: Insufficient documentation

## 2019-11-03 DIAGNOSIS — J449 Chronic obstructive pulmonary disease, unspecified: Secondary | ICD-10-CM | POA: Diagnosis not present

## 2019-11-03 DIAGNOSIS — G709 Myoneural disorder, unspecified: Secondary | ICD-10-CM | POA: Insufficient documentation

## 2019-11-03 DIAGNOSIS — R0602 Shortness of breath: Secondary | ICD-10-CM

## 2019-11-03 DIAGNOSIS — K219 Gastro-esophageal reflux disease without esophagitis: Secondary | ICD-10-CM | POA: Diagnosis not present

## 2019-11-03 LAB — BASIC METABOLIC PANEL
Anion gap: 7 (ref 5–15)
BUN: 20 mg/dL (ref 6–20)
CO2: 29 mmol/L (ref 22–32)
Calcium: 9.6 mg/dL (ref 8.9–10.3)
Chloride: 101 mmol/L (ref 98–111)
Creatinine, Ser: 0.6 mg/dL (ref 0.44–1.00)
GFR calc Af Amer: >60 mL/min (ref >60)
GFR calc non Af Amer: >60 mL/min (ref >60)
Glucose, Bld: 103 mg/dL — ABNORMAL HIGH (ref 70–99)
Potassium: 4.1 mmol/L (ref 3.5–5.1)
Sodium: 137 mmol/L (ref 135–145)

## 2019-11-03 MED ORDER — BISOPROLOL FUMARATE 5 MG PO TABS: 2.5000 mg | ORAL_TABLET | Freq: Every day | ORAL | 11 refills | Status: DC

## 2019-11-03 NOTE — Patient Instructions (Addendum)
DECREASE Bisoprolol to 2.5mg  (1 tab) daily   Labs today We will only contact you if something comes back abnormal or we need to make some changes. Otherwise no news is good news!   Your physician wants you to follow-up in: 6 months. You will receive a reminder letter in the mail two months in advance. If you don't receive a letter, please call our office to schedule the follow-up appointment.   Please call office at [phone] option 2 if you have any questions or concerns.    At the [facility], you and your health needs are our priority. As part of our continuing mission to provide you with exceptional heart care, we have created designated Provider Care Teams. These Care Teams include your primary Cardiologist (physician) and Advanced Practice Providers (APPs- Physician Assistants and Nurse Practitioners) who all work together to provide you with the care you need, when you need it.   You may see any of the following providers on your designated Care Team at your next follow up: Marland Kitchen Dr Arvilla Meres . Dr Marca Ancona . Tonye Becket, NP . Robbie Lis, PA . Karle Plumber, PharmD   Please be sure to bring in all your medications bottles to every appointment.   a

## 2019-11-05 NOTE — Progress Notes (Signed)
Patient ID: [name], female   DOB: May 29, 1961, 59 y.o.   MRN: [id number] PCP: Dr. Corliss Blacker Cardiology: Dr. Shirlee Latch  59 y.o. with complicated past medical history presents for evaluation of her cardiomyopathy.  She has a long history of neuromuscular disorder.  The exact diagnosis remains undetermined.  She has been confined to a wheelchair or a walker.  In 12/13, she was admitted to [facility] with hypotension, hypothermia, and altered mental status.   She was found to have profound anemia with hemoglobin down to 3.  She was transfused 4 units.  She also was found to have Enterococcal UTI and Serratia PNA.  She was intubated and eventually required tracheostomy.  During her hospitalization, she was found to have a cardiomyopathy with EF 20-25% by echo.  She was treated with milrinone.  Repeat echo in 5/14 showed EF improved to 50-55% with abnormal septal motion and last echo in 9/14 showed EF 55-60%.    Previously lasix was increased to 40 mg in am and 20 mg in pm along with 20 meq of potassium. I offered her a RHC however she declined. PFTs obtained as noted below and showed severe airway obstruction with subsequent evaluation by Dr [name].  At that time Dr [name] switched lisinopril to irbesartan. He felt that despite severity of her COPD, main limitation was her neuromuscular condition. Event monitor was worn in 1/15 due to tachypalpitations.  This showed PACs.  Event monitor was worn again in 12/17, this again showed PACs with short runs of atrial tachycardia.   Echo in 10/20 showed EF 60-65%, normal RV.   She returns for followup of CHF and palpitations.  She continues to have periodic exacerbations of COPD/asthma for which she is given steroids.  She wears home oxygen.  She seems more or less stable symptomatically compared to the past.  She is taking Lasix 20 mg daily.  Breathing is up and down, generally is wheezing when she gets short of breath. Walking with walker now, no longer in wheelchair.   Does not use walker in her house.  No chest pain.  Occasional positional lightheadedness.   Labs (1/14): K 3.9, creatinine 0.21, HCT 30 Labs (4/14): K 4, creatinine 0.4, BNP 19 Labs (10/14): K 3.5, creatinine 0.5 Labs (12/14): K 3.6, creatinine 0.4 Labs (07/28/12): K 3.5 creatinine 0.5 Labs (08/01/13): K 3.5 Creatinine 0.5 Pro BNP 129 Labs (2/16): K 3.8, creatinine 0.44 Labs (9/16): K 3.6, creatinine 0.52, BNP 14 Labs (11/17): K 4.6, creatinine 0.53, BNP 16 Labs (5/18): K 3.7, creatinine 0.53 Labs (1/19): K 3.7, creatinine 0.56 Labs (12/19): K 3.9, creatinine 0.57 Labs (9/20): BNP 21  PMH: 1. Anorexia/cachexia/failure to thrive: Patient had a PEG tube.  2. Neuromuscular disease: exact diagnosis is still undetermined despite extensive neurology evaluation.  She does not have multiple sclerosis.  3. Fibromyalgia 4. IBS 5. TMJ syndrome 6. PTSD 7. GERD 8. Sacral decubitus ulcer 9. H/o aspiration 10. 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  Echo (12/13) with EF 20-25% with diffuse hypokinesis, mild MR, moderate RV dilation and moderate RV dysfunction.  Echo (5/14) with EF 50-55%, abnormal septal motion.  Echo (9/14) with EF 55-60%.   - Echo (5/17): EF 60-65%.  - Echo (7/18): EF 60-65%, normal RV.  - Echo (10/20): EF 60-65%, normal RV 11. Anemia 12. COPD/asthma: PFTs 08/01/13 with FEV1 0.98 liters (33%), FVC 1.87 (50%), FEV 1/FVC 52%, FEF 25-75% 0.65 liter.  - She is on home oxygen.  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 Palpitations: Event monitor in 12/17 showed PACs and short runs of atrial tachycardia.  - 2/19 event monitor: She only wore it for a few days.  No significant arrhythmias.   SH: Lives with husband in [place].  Quit smoking > 20 years ago.  No ETOH.     FH: No history of cardiomyopathy.  Grandfather had MI at 54.   ROS: All systems reviewed and negative except as  per HPI.    Current Outpatient Medications  Medication Sig Dispense Refill  . acetaminophen (TYLENOL) 325 MG tablet Take 650 mg by mouth every 6 (six) hours as needed.    Marland Kitchen azithromycin (ZITHROMAX) 250 MG tablet Take 1 tablet (250 mg total) by mouth as needed (for copd flare up). 6 each 11  . bisoprolol (ZEBETA) 5 MG tablet Take 0.5 tablets (2.5 mg total) by mouth daily. 15 tablet 11  . furosemide (LASIX) 20 MG tablet Take 20 mg by mouth daily.    . irbesartan (AVAPRO) 75 MG tablet Take 1 tablet (75 mg total) by mouth daily. 30 tablet 11  . levalbuterol (XOPENEX HFA) 45 MCG/ACT inhaler Inhale up to 2 puffs every 4 hours as needed for shortness of breath or wheezing 1 Inhaler 5  . MAGNESIUM PO Take 1 tablet by mouth daily.    . meperidine (DEMEROL) 50 MG/5ML solution Take 5 mLs (50 mg total) by mouth every 4 (four) hours as needed. 150 mL 0  . mometasone-formoterol (DULERA) 200-5 MCG/ACT AERO INHALE 2 PUFFS FIRST THING IN THE MORNING AND ANOTHER 2 PUFFS 12 HOURS LATER 13 g 2  . OXYGEN 3 L continuous.     . potassium chloride (KLOR-CON 10) 10 MEQ tablet Take 2 tablets (20 mEq total) by mouth daily. 60 tablet 11  . predniSONE (DELTASONE) 10 MG tablet Take 1 tablet (10 mg total) by mouth as needed (for copd flare up). 100 tablet 2  . SPIRIVA RESPIMAT 2.5 MCG/ACT AERS USE 2 PUFFS EVERY MORNING. 4 g 2  . Zinc 30 MG CAPS Take 1 capsule by mouth daily.      No current facility-administered medications for this encounter.    BP (!) 94/58   Pulse 87   SpO2 100% Comment: 3L of oxygen General: NAD, thin Neck: No JVD, no thyromegaly or thyroid nodule.  Lungs: Clear to auscultation bilaterally with normal respiratory effort. CV: Nondisplaced PMI.  Heart regular S1/S2, no S3/S4, no murmur.  No peripheral edema.  No carotid bruit.  Normal pedal pulses.  Abdomen: Soft, nontender, no hepatosplenomegaly, no distention.  Skin: Intact without lesions or rashes.  Neurologic: Alert and oriented x 3.   Psych: Normal affect. Extremities: No clubbing or cyanosis.  HEENT: Normal.   Assessment/Plan: 1. Cardiomyopathy: Last echo in 10/20 was normal.  I suspect that her prior low EF was due to a stress (Takotsubo-type) cardiomyopathy.  She is not volume overloaded on exam.  She has chronic NYHA class III symptoms, but think this may be due more to COPD/asthma. - She can continue current Lasix and irbesartan.  - With soft BP and periodic dizziness, decrease bisoprolol to 2.5 mg daily.    - BMET today.   2. Dyspnea: NYHA class III symptoms, chronic. Suspect combination of COPD/asthma and deconditioning due to neuromuscular disorder.  As above, doubt active CHF.  She is followed closely by Dr Melvyn Novas. She is wearing oxygen now at all times.  She has periodic flares  where she will take prednisone.  3. Neuromuscular disorder: Not fully characterized.  She used to be in a wheelchair, now mostly uses walker. I have offered to refer her back to neurology in the past but she declined.   Followup in 6 months.    Rayni Nemitz,MD 11/05/2019

## 2019-11-10 ENCOUNTER — Ambulatory Visit: Payer: 59 | Admitting: Internal Medicine

## 2019-11-17 ENCOUNTER — Ambulatory Visit: Payer: 59 | Admitting: Internal Medicine

## 2019-12-05 ENCOUNTER — Ambulatory Visit: Payer: 59 | Admitting: Adult Health

## 2019-12-05 ENCOUNTER — Ambulatory Visit: Payer: 59 | Admitting: Internal Medicine

## 2019-12-06 ENCOUNTER — Other Ambulatory Visit: Payer: Self-pay | Admitting: Internal Medicine

## 2019-12-11 ENCOUNTER — Encounter: Payer: Self-pay | Admitting: Internal Medicine

## 2019-12-11 ENCOUNTER — Ambulatory Visit (INDEPENDENT_AMBULATORY_CARE_PROVIDER_SITE_OTHER): Payer: 59 | Admitting: Internal Medicine

## 2019-12-11 ENCOUNTER — Other Ambulatory Visit: Payer: Self-pay

## 2019-12-11 DIAGNOSIS — J449 Chronic obstructive pulmonary disease, unspecified: Secondary | ICD-10-CM

## 2019-12-11 NOTE — Progress Notes (Signed)
Subjective:   Patient ID: [name], female    DOB: [birth year]   MRN: [id number]    Brief patient profile:  58 yowf quit smoking 1989 then ? MS dx'd early 33's by [facility] (though pt states flatly she doesn't have MS)  and "it's been steadily downhill since" mostly muscle related but since 2012 also with breathing difficulty > admit:   Admit date: 06/06/2012  Discharge date: 06/20/2012  Discharge Diagnoses:  Acute respiratory failure  CAP (community acquired pneumonia) vs aspiration pneumonia  Acute encephalopathy, resolved  Neuromuscular disorder: not fully defined.  Anemia  Thrombocytopenia  Coagulopathy  Acute liver failure  Enterococcal UTI (pansensitive)  Shock circulatory, suspect primarily cardiogenic  Sacral Pressure Ulcer  Dilated cardiomyopathy  Malnutrition compromising bodily function  H/O anorexia nervosa  Dysphagia    History of Present Illness  08/17/2013 1st [facility] office visit/ [name] was able do some adl/s with walker and no 02 summer of 2014  then downhill since  And now sob at rest  X 2 weeks and able to lie down at 45 degrees and can't tol lying flat at all for sev weeks assoc with dry cough day and night and dysphagia/ choking sensation on acei.  Already on gerd rx/ inhaler no benefit.  rec Stop lisinopril Avapro 75 mg one daily  Prilosec 20 mg Take 30- 60 min before your first and last meals of the day  GERD diet     09/15/2013 f/u ov/Josiyah Tozzi re: acei vs copd  Chief Complaint  Patient presents with  . Follow-up    Pt states that her SOB/cough has improved greatly.  Pt still c/o SOB but is much better since lv.    limited more by fatigue and weakness than sob, swallowing fine now  rec spiriva 2puffs each am to see if your activity tolerance improves> improved so maint rx    09/05/2014 acute ov/Alesi Zachery re: worse sob/increase need for saba on spiriva maint rx  Chief Complaint  Patient presents with  . Acute Visit    Pt c/o increased  SOB- started mid Nov 2015, worse for the past month. She is using xopenex 2-3 x per day on average. She c/o cough for the past month- sometimes prod with clear yellow/green sputum. She has had some occ bloody nasal d/c.  Has not been able to lie down for several years, sitting upright in hosp bed due to sob Swallowing worse "because cough is worse" despite  on prilosec 20 bid with meals  Denies choking but all foods are pureed. rec Change omeprazole to where you take 20mg  x 2 Take 30- 60 min before your first and last meals of the day  GERD   Try flutter valve as needed for cough or congestion > ok to cough into the valve if helps    10/25/2014 f/u ov/Suzy Kugel re: mild copd/ MS with chronic dysphagia/ off pred x 21 days prior to OV   Chief Complaint  Patient presents with  . Acute Visit    Pt c/o decreased o2 sats since 09/26/14- feels fatigued and more SOB. She states that she is also aspirating more and got choked drinking water recently.   while on prednisone did not use any xopenex hfa but prior was using it up 3 x daily  On prednisone cough also goes away  But worse in am's without it. Thinks she may be aspirating more since Nov 2015 >   Convinced her copd is progressing based on her readings  Mucus is minimal, no food in it rec Please see patient coordinator before you leave today  to schedule overnight oximetry> neg   modified barium swallow>MBS 10/29/2014  Diet Recommendations Dysphagia 1 (Puree);Thin liquid;Dysphagia 2  (Fine chop)  Liquid Administration via Cup  Medication Administration Crushed with puree  Compensations Slow rate;Small sips/bites;Multiple dry swallows  after each bite/sip  Postural Changes and/or Swallow Maneuvers Seated upright 90  degrees;Chin tuck Add automatically Dulera 100 Take 2 puffs first thing in am and then another 2 puffs about 12 hours later.  Plan B = backup  Only use your levoalbuterol  If needed  Cough into the flutter valve if having trouble  getting anything up       06/04/2015  f/u ov/Mabel Unrein re:  Probable copd on spiriva and dulera 100 2bid Chief Complaint  Patient presents with  . Follow-up    Pt c/o increased SOB for the 3 wks. She states that she coughs more when she talks and produces clear sputum.  She has been using her o2 more, and xopenex 1-2 x per wk.   Uses 02 daytime prn  Never uses at hs  Not seeing neuro any more / "the only reason my muscles get worse is that my breathing gets worse" swallowing no change. Prednisone really helps breathing  rec Increase dulera to 200 Take 2 puffs first thing in am and then another 2 puffs about 12 hours later.  02 2lpm at bedtime and during the day goal is  to keep above 90% so ok to adjust up down or off to achieve that goal Prednisone 10 mg take  4 each am x 2 days,   2 each am x 2 days,  1 each am x 2 days and stop   07/12/15 requested pred rx p perfume exp     04/12/2018 acute extended ov/Ibrahim Mcpheeters QQ:[facility] sob  since late August 2019  Chief Complaint  Patient presents with  . Acute Visit    She states her breathing is not improving since the last visit. She is wheezing some. She states "there is a lack of expansion in my lungs". She is using her xopenex inhaler every morning to help with wheezing.   usual pattern  cough/ wheeze tried prednisone late Aug 2019 this time no better, some better at least for four hours p xopenex hfa never more than a few times a week but did not respond to prednisone and in fact worse then hemoptysis/cp w/in a few days of ov with NP 03/30/18 who rec Continue prednisone taper Continue xopenex, dulera, and spiriva Zpak and increase 02 if needed to 3lpm > smidge better  starting 10 d prior to OV using xopenex with am dulera / spiriva and increased 02 to 3lpm and restarted prednisone 3 d prior to OV   Cp varies posteriorly = on both sides not really pleuritic but p coughing fits feels sore rec Only use your Levoalbuterol as a rescue medication  Work on  inhaler technique:    Please see patient coordinator before you leave today  to schedule CTa of chest > changed to v/q due to contrast allergy  Take delsym two tsp every 12 hours and supplement if needed with  Demerol 50 mg up to 1  every 4 hours to suppress the urge to cough.  Once you have eliminated the cough for 3 straight days try reducing the demerol  first,  then the delsym as tolerated.      06/07/2018  f/u  ov/Domenique Southers re: copd  GOLD 0 / uacs  Chief Complaint  Patient presents with  . Follow-up    Breathing has improved some. She states still does not have much stamina. She has not needed her xopenex inhaler.   Dyspnea:   15 ft and stops/ 02 helps despite absence of desats documented  Cough: better, does seem to respond to prednisone - restarts every 2-3 weeks  X 6 days desptite dulera 200 /spiriva  Sleeping: 90 degrees hosp bed  SABA use: none  02: 3lpm 24/7   Nasal discharge better p zpak  No more cp or hemoptysis  rec Work on maintaining perfect  inhaler technique:  relax and gently blow all the way out then take a nice smooth deep breath back in, triggering the inhaler at same time you start breathing in.  Hold for up to 5 seconds if you can. Blow dulera out thru nose. Rinse and gargle with water when done  For flare of breathing problems that don't respond to xopenex or persistent cough > Prednisone 10 mg take  4 each am x 2 days,   2 each am x 2 days,  1 each am x 2 days and stop  For nasty mucus > zpak  Please schedule a follow up visit in 3 months but call sooner if needed     01/09/2019  f/u ov/Roniel Halloran re: GOLD 0/ copd maint empirical = duler 200/ spiriva Chief Complaint  Patient presents with  . Follow-up    Prod cough and nasal d/c- green in color for the past month. She is using her xopenex inhaler once per wk on average.   Dyspnea:  No change doe = MMRC3 = can't walk 100 yards even at a slow pace at a flat grade s stopping due to sob  Even on 3lpm  Cough: first thing in  am or if talks alot Sleeping: "90 degrees"  = baseline, says can't lie back due to sob  SABA use: as above 02: 3lpm 24/7  rec For discolored / bloody or nasty mucus > zpak For wheezing / short of breath, increased need for xopenex > Prednisone 10 mg take  4 each am x 2 days,   2 each am x 2 days,  1 each am x 2 days and stop    05/12/2019  f/u ov/Junnie Loschiavo re:  GOLD 0 / maint dulera 200/ spiriva and 02 pt purchased as did not qualify  Chief Complaint  Patient presents with  . Follow-up    Overall doing well but having minimal chest tightness and wheezing. She has used her xopenex inhaler a few times recently.   Dyspnea:  Walking across house then stop , to mb and back stop half way Cough: variable - flares self rx with short course pred./ zpak successfully / not recently  Sleeping: baseline 90 SABA use: rarely 02: 3lpm 24/7  rec No change rx    12/11/2019  f/u ov/Lyndell Gillyard re: copd/ unable to do pfts  Chief Complaint  Patient presents with  . Follow-up    6 month f/u up for COPD. States her breathing has been stable since last visit.   Dyspnea:  Able to walk around the house now on 3lpm  Cough: no prednisone / zpak  Sleeping: 90 degrees o/w coughing SABA use: avg once a day  02: 3lpm 24/7 despite not qualifying for 02    No obvious day to day or daytime variability or assoc excess/ purulent sputum or mucus plugs or  hemoptysis or cp or chest tightness, subjective wheeze or overt sinus or hb symptoms.   Sleeping as above  without nocturnal  or early am exacerbation  of respiratory  c/o's or need for noct saba. Also denies any obvious fluctuation of symptoms with weather or environmental changes or other aggravating or alleviating factors except as outlined above   No unusual exposure hx or h/o childhood pna/ asthma or knowledge of premature birth.  Current Allergies, Complete Past Medical History, Past Surgical History, Family History, and Social History were reviewed in [employer] [employer] record.  ROS  The following are not active complaints unless bolded Hoarseness, sore throat, dysphagia = baseline, dental problems, itching, sneezing,  nasal congestion or discharge of excess mucus or purulent secretions, ear ache,   fever, chills, sweats, unintended wt loss or wt gain, classically pleuritic or exertional cp,  orthopnea pnd or arm/hand swelling  or leg swelling, presyncope, palpitations, abdominal pain, anorexia, nausea, vomiting, diarrhea  or change in bowel habits or change in bladder habits, change in stools or change in urine, dysuria, hematuria,  rash, arthralgias, visual complaints, headache, numbness, weakness or ataxia or problems with walking or coordination some better ,  change in mood or  memory.        Current Meds  Medication Sig  . acetaminophen (TYLENOL) 325 MG tablet Take 650 mg by mouth every 6 (six) hours as needed.  Marland Kitchen azithromycin (ZITHROMAX) 250 MG tablet Take 1 tablet (250 mg total) by mouth as needed (for copd flare up).  . bisoprolol (ZEBETA) 5 MG tablet Take 0.5 tablets (2.5 mg total) by mouth daily.  . irbesartan (AVAPRO) 75 MG tablet Take 1 tablet (75 mg total) by mouth daily.  Marland Kitchen levalbuterol (XOPENEX HFA) 45 MCG/ACT inhaler Inhale up to 2 puffs every 4 hours as needed for shortness of breath or wheezing  . MAGNESIUM PO Take 1 tablet by mouth daily.  . meperidine (DEMEROL) 50 MG/5ML solution Take 5 mLs (50 mg total) by mouth every 4 (four) hours as needed.  . mometasone-formoterol (DULERA) 200-5 MCG/ACT AERO INHALE 2 PUFFS BY MOUTH IN THE MORNING AND 2 PUFFS 12 HOURS LATER  . omeprazole (PRILOSEC) 20 MG capsule Take 40 mg by mouth 2 (two) times daily before a meal.  . OXYGEN 3 L continuous.   . potassium chloride (KLOR-CON 10) 10 MEQ tablet Take 2 tablets (20 mEq total) by mouth daily.  . predniSONE (DELTASONE) 10 MG tablet Take 1 tablet (10 mg total) by mouth as needed (for copd flare up).  . SPIRIVA RESPIMAT 2.5 MCG/ACT AERS INHALE  2 SPRAY(S) BY MOUTH ONCE DAILY IN THE MORNING  . Zinc 30 MG CAPS Take 1 capsule by mouth daily.                     Objective:   Physical Exam  Chronically ill but pleasant amb wf nad   12/11/2019       105  05/12/2019     113   01/09/2019      112     08/17/13 113 lb (51.256 kg)  07/18/13 114 lb 6.4 oz (51.891 kg)  06/21/13 112 lb (50.803 kg)     Vital signs reviewed  12/11/2019  - Note at rest 02 sats  98% on 3lpm cont     HEENT : pt wearing mask not removed for exam due to covid - 19 concerns.   NECK :  without JVD/Nodes/TM/ nl carotid upstrokes bilaterally  LUNGS: no acc muscle use,  Min barrel  contour chest wall with bilateral  slightly decreased bs s audible wheeze and  without cough on insp or exp maneuvers and min  Hyperresonant  to  percussion bilaterally     CV:  RRR  no s3 or murmur or increase in P2, and no edema   ABD:  soft and nontender with pos end  insp Hoover's  in the supine position. No bruits or organomegaly appreciated, bowel sounds nl  MS:   Nl gait/  ext warm without deformities, calf tenderness, cyanosis or clubbing No obvious joint restrictions   SKIN: warm and dry without lesions    NEURO:  alert, approp, nl sensorium with  no motor or cerebellar deficits apparent.               Assessment & Plan:

## 2019-12-11 NOTE — Patient Instructions (Signed)
No change in your medications or 02    Please schedule a follow up visit in 6 months but call sooner if needed

## 2019-12-13 ENCOUNTER — Encounter: Payer: Self-pay | Admitting: Internal Medicine

## 2019-12-13 NOTE — Assessment & Plan Note (Addendum)
Quit smoking 1989 - see f/v loops 08/01/13 c/w upper airway obstruction > refused to repeat off acei  - trial off acei 08/18/2013 > marked improvement - 09/15/2013   Walked RA x about 50 ft and stopped due to  Fatigue s sob or desat - Spiriva trial 09/15/2013 > improved 10/27/13 so continue - added flutter valve 09/05/2014  - 10/25/2014  added dulera 100 2bid based on reported improvement with prednisone> much better - requiring prn pred since 05/2015 s documentation it helps (refuses pfts)  - V/q 04/18/2018   wnl ( not typical of copd and neg for PE)  . 09/07/2018  After extensive coaching inhaler device,  effectiveness =    90%    Adequate control on present rx, reviewed in detail with pt > no change in rx needed           Each maintenance medication was reviewed in detail including emphasizing most importantly the difference between maintenance and prns and under what circumstances the prns are to be triggered using an action plan format where appropriate.  Total time for H and P, chart review, counseling, reviewing devices and generating customized AVS unique to this office visit / charting = 20 min

## 2020-01-01 ENCOUNTER — Telehealth (HOSPITAL_COMMUNITY): Payer: Self-pay | Admitting: *Deleted

## 2020-01-01 NOTE — Telephone Encounter (Signed)
Pt called to let us know she took herself off her Lasix and KCL about a month ago has been doing well, she states she has been feeling much better off the Lasix and with decreased bisoprolol (2.5 QD) she would like to wean off the bisoprolol and irbesartan if ok with Dr Shirlee Latch. Advised ok to Lasix and KCL PRN, I will send message to Dr Shirlee Latch about Irbesartan and Bisoprolol and get back to her next week.

## 2020-01-01 NOTE — Telephone Encounter (Signed)
Fine to stay off Lasix and KCl, would continue current bisoprolol and irbesartan.

## 2020-01-05 NOTE — Telephone Encounter (Signed)
Patient advised and verbalized understanding. She will adhere to Dr. Alford Highland recommendations,med list updated to reflect changes

## 2020-01-24 ENCOUNTER — Other Ambulatory Visit (HOSPITAL_COMMUNITY): Payer: Self-pay | Admitting: Cardiology

## 2020-02-16 ENCOUNTER — Telehealth (HOSPITAL_COMMUNITY): Payer: Self-pay | Admitting: Cardiology

## 2020-02-16 ENCOUNTER — Telehealth: Payer: Self-pay | Admitting: Internal Medicine

## 2020-02-16 NOTE — Telephone Encounter (Signed)
Patient wants to know if DM recommends her to take the booster shot  for the Covid 19 vaccine, please advise

## 2020-02-16 NOTE — Telephone Encounter (Signed)
Spoke with the pt and notified of Dr Thurston Hole response. She verbalized understanding and nothing further needed.

## 2020-02-16 NOTE — Telephone Encounter (Signed)
Not at this point but may well be included in the 2nd or 3rd group as the guidelines are being re-written

## 2020-02-16 NOTE — Telephone Encounter (Signed)
If she qualifies through immunosuppression, not sure what the criteria are for this.

## 2020-02-16 NOTE — Telephone Encounter (Signed)
Patient asking for advise from Dr. [name] on getting the third vaccine. She got both vaccines in April, Moderna. Would you consider her immune compromised? Should she get it as soon as it becomes available? Please advise.

## 2020-02-21 NOTE — Telephone Encounter (Signed)
Patient advised and verbalized understanding. Patient states she already received it.

## 2020-05-07 ENCOUNTER — Encounter (HOSPITAL_COMMUNITY): Payer: 59 | Admitting: Cardiology

## 2020-06-11 ENCOUNTER — Other Ambulatory Visit: Payer: Self-pay

## 2020-06-11 ENCOUNTER — Ambulatory Visit: Payer: 59 | Admitting: Internal Medicine

## 2020-06-11 MED ORDER — DULERA 200-5 MCG/ACT IN AERO: INHALATION_SPRAY | RESPIRATORY_TRACT | 11 refills | Status: DC

## 2020-06-21 ENCOUNTER — Encounter (HOSPITAL_COMMUNITY): Payer: 59 | Admitting: Cardiology

## 2020-07-29 ENCOUNTER — Ambulatory Visit: Payer: 59 | Admitting: Internal Medicine

## 2020-08-05 ENCOUNTER — Encounter (HOSPITAL_COMMUNITY): Payer: 59 | Admitting: Cardiology

## 2020-08-06 ENCOUNTER — Ambulatory Visit (INDEPENDENT_AMBULATORY_CARE_PROVIDER_SITE_OTHER): Payer: 59 | Admitting: Internal Medicine

## 2020-08-06 ENCOUNTER — Other Ambulatory Visit: Payer: Self-pay

## 2020-08-06 ENCOUNTER — Encounter: Payer: Self-pay | Admitting: Internal Medicine

## 2020-08-06 DIAGNOSIS — R0789 Other chest pain: Secondary | ICD-10-CM

## 2020-08-06 DIAGNOSIS — R058 Other specified cough: Secondary | ICD-10-CM

## 2020-08-06 DIAGNOSIS — J449 Chronic obstructive pulmonary disease, unspecified: Secondary | ICD-10-CM

## 2020-08-06 MED ORDER — LEVALBUTEROL TARTRATE 45 MCG/ACT IN AERO: INHALATION_SPRAY | RESPIRATORY_TRACT | 5 refills | Status: DC

## 2020-08-06 MED ORDER — AZITHROMYCIN 250 MG PO TABS: 250.0000 mg | ORAL_TABLET | ORAL | 11 refills | Status: DC | PRN

## 2020-08-06 MED ORDER — SPIRIVA RESPIMAT 2.5 MCG/ACT IN AERS: INHALATION_SPRAY | RESPIRATORY_TRACT | 11 refills | Status: DC

## 2020-08-06 MED ORDER — PREDNISONE 10 MG PO TABS: 10.0000 mg | ORAL_TABLET | ORAL | 2 refills | Status: DC | PRN

## 2020-08-06 NOTE — Assessment & Plan Note (Addendum)
Quit smoking 1989 - see f/v loops 08/01/13 c/w upper airway obstruction > refused to repeat off acei  - trial off acei 08/18/2013 > marked improvement - 09/15/2013   Walked RA x about 50 ft and stopped due to  Fatigue s sob or desat - Spiriva trial 09/15/2013 > improved 10/27/13 so continue - added flutter valve 09/05/2014  - 10/25/2014  added dulera 100 2bid based on reported improvement with prednisone> much better - requiring prn pred since 05/2015 s documentation it helps (refuses pfts)  - V/q 04/18/2018   wnl ( not typical of copd and neg for PE)  . 09/07/2018  After extensive coaching inhaler device,  effectiveness =    90%    Adequate control on present rx, reviewed in detail with pt > no change in rx needed    Has prednisone for flares but not using recently > reviewed approp contingency use.

## 2020-08-06 NOTE — Assessment & Plan Note (Addendum)
Apparently never tried the zostrix rec in 2015 and has some pain correlating with site of L tcentesis so rec rechallenge with zostrix/ gate theory reviewed         Each maintenance medication was reviewed in detail including emphasizing most importantly the difference between maintenance and prns and under what circumstances the prns are to be triggered using an action plan format where appropriate.  Total time for H and P, chart review, counseling, reviewing hfa/02 device(s) and generating customized AVS unique to this office visit / same day charting = 34 min

## 2020-08-06 NOTE — Patient Instructions (Addendum)
Make sure you check your oxygen saturation  at your highest level of activity  to be sure it stays over 90% and adjust  02 flow upward to maintain this level if needed but remember to turn it back to previous settings when you stop (to conserve your supply).   No change in medications  Zostrix cream is over the counter - start with small bedtime dose   Please schedule a follow up visit in 6 months but call sooner if needed  >>> add: walking sats next ov

## 2020-08-06 NOTE — Assessment & Plan Note (Addendum)
ACEi d/c 08/18/13 >> marked improvement  MBS 10/29/2014  >> Diet Recommendations Dysphagia 1 (Puree);Thin liquid;Dysphagia 2  - Flare with perfume exp 07/2015 - Flare with dust exp - see ov 08/19/2015    Has used prednisone prn for flare with good control though not able to make any specific dx here > no change in rx

## 2020-08-06 NOTE — Progress Notes (Signed)
Subjective:   Patient ID: [name], female    DOB: [birth year]   MRN: [id number]    Brief patient profile:  59 yowf quit smoking 1989 then ? MS dx'd early 92's by [facility] (though pt states flatly she doesn't have MS)  and "it's been steadily downhill since" mostly muscle related but since 2012 also with breathing difficulty > admit:   Admit date: 06/06/2012  Discharge date: 06/20/2012  Discharge Diagnoses:  Acute respiratory failure  CAP (community acquired pneumonia) vs aspiration pneumonia  Acute encephalopathy, resolved  Neuromuscular disorder: not fully defined.  Anemia  Thrombocytopenia  Coagulopathy  Acute liver failure  Enterococcal UTI (pansensitive)  Shock circulatory, suspect primarily cardiogenic  Sacral Pressure Ulcer  Dilated cardiomyopathy  Malnutrition compromising bodily function  H/O anorexia nervosa  Dysphagia    History of Present Illness  08/17/2013 1st [facility] office visit/ [name] was able do some adl/s with walker and no 02 summer of 2014  then downhill since  And now sob at rest  X 2 weeks and able to lie down at 45 degrees and can't tol lying flat at all for sev weeks assoc with dry cough day and night and dysphagia/ choking sensation on acei.  Already on gerd rx/ inhaler no benefit.  rec Stop lisinopril Avapro 75 mg one daily  Prilosec 20 mg Take 30- 60 min before your first and last meals of the day  GERD diet     09/15/2013 f/u ov/Wandra Babin re: acei vs copd  Chief Complaint  Patient presents with  . Follow-up    Pt states that her SOB/cough has improved greatly.  Pt still c/o SOB but is much better since lv.    limited more by fatigue and weakness than sob, swallowing fine now  rec spiriva 2puffs each am to see if your activity tolerance improves> improved so maint rx    09/05/2014 acute ov/Hailee Hollick re: worse sob/increase need for saba on spiriva maint rx  Chief Complaint  Patient presents with  . Acute Visit    Pt c/o increased  SOB- started mid Nov 2015, worse for the past month. She is using xopenex 2-3 x per day on average. She c/o cough for the past month- sometimes prod with clear yellow/green sputum. She has had some occ bloody nasal d/c.  Has not been able to lie down for several years, sitting upright in hosp bed due to sob Swallowing worse "because cough is worse" despite  on prilosec 20 bid with meals  Denies choking but all foods are pureed. rec Change omeprazole to where you take 20mg  x 2 Take 30- 60 min before your first and last meals of the day  GERD   Try flutter valve as needed for cough or congestion > ok to cough into the valve if helps    10/25/2014 f/u ov/Geniece Akers re: mild copd/ MS with chronic dysphagia/ off pred x 21 days prior to OV   Chief Complaint  Patient presents with  . Acute Visit    Pt c/o decreased o2 sats since 09/26/14- feels fatigued and more SOB. She states that she is also aspirating more and got choked drinking water recently.   while on prednisone did not use any xopenex hfa but prior was using it up 3 x daily  On prednisone cough also goes away  But worse in am's without it. Thinks she may be aspirating more since Nov 2015 >   Convinced her copd is progressing based on her readings  Mucus is minimal, no food in it rec Please see patient coordinator before you leave today  to schedule overnight oximetry> neg   modified barium swallow>MBS 10/29/2014  Diet Recommendations Dysphagia 1 (Puree);Thin liquid;Dysphagia 2  (Fine chop)  Liquid Administration via Cup  Medication Administration Crushed with puree  Compensations Slow rate;Small sips/bites;Multiple dry swallows  after each bite/sip  Postural Changes and/or Swallow Maneuvers Seated upright 90  degrees;Chin tuck Add automatically Dulera 100 Take 2 puffs first thing in am and then another 2 puffs about 12 hours later.  Plan B = backup  Only use your levoalbuterol  If needed  Cough into the flutter valve if having trouble  getting anything up       06/04/2015  f/u ov/Kimeka Badour re:  Probable copd on spiriva and dulera 100 2bid Chief Complaint  Patient presents with  . Follow-up    Pt c/o increased SOB for the 3 wks. She states that she coughs more when she talks and produces clear sputum.  She has been using her o2 more, and xopenex 1-2 x per wk.   Uses 02 daytime prn  Never uses at hs  Not seeing neuro any more / "the only reason my muscles get worse is that my breathing gets worse" swallowing no change. Prednisone really helps breathing  rec Increase dulera to 200 Take 2 puffs first thing in am and then another 2 puffs about 12 hours later.  02 2lpm at bedtime and during the day goal is  to keep above 90% so ok to adjust up down or off to achieve that goal Prednisone 10 mg take  4 each am x 2 days,   2 each am x 2 days,  1 each am x 2 days and stop   07/12/15 requested pred rx p perfume exp     04/12/2018 acute extended ov/Markayla Reichart QQ:[facility] sob  since late August 2019  Chief Complaint  Patient presents with  . Acute Visit    She states her breathing is not improving since the last visit. She is wheezing some. She states "there is a lack of expansion in my lungs". She is using her xopenex inhaler every morning to help with wheezing.   usual pattern  cough/ wheeze tried prednisone late Aug 2019 this time no better, some better at least for four hours p xopenex hfa never more than a few times a week but did not respond to prednisone and in fact worse then hemoptysis/cp w/in a few days of ov with NP 03/30/18 who rec Continue prednisone taper Continue xopenex, dulera, and spiriva Zpak and increase 02 if needed to 3lpm > smidge better  starting 10 d prior to OV using xopenex with am dulera / spiriva and increased 02 to 3lpm and restarted prednisone 3 d prior to OV   Cp varies posteriorly = on both sides not really pleuritic but p coughing fits feels sore rec Only use your Levoalbuterol as a rescue medication  Work on  inhaler technique:    Please see patient coordinator before you leave today  to schedule CTa of chest > changed to v/q due to contrast allergy  Take delsym two tsp every 12 hours and supplement if needed with  Demerol 50 mg up to 1  every 4 hours to suppress the urge to cough.  Once you have eliminated the cough for 3 straight days try reducing the demerol  first,  then the delsym as tolerated.      06/07/2018  f/u  ov/Lovina Zuver re: copd  GOLD 0 / uacs  Chief Complaint  Patient presents with  . Follow-up    Breathing has improved some. She states still does not have much stamina. She has not needed her xopenex inhaler.   Dyspnea:   15 ft and stops/ 02 helps despite absence of desats documented  Cough: better, does seem to respond to prednisone - restarts every 2-3 weeks  X 6 days desptite dulera 200 /spiriva  Sleeping: 90 degrees hosp bed  SABA use: none  02: 3lpm 24/7   Nasal discharge better p zpak  No more cp or hemoptysis  rec Work on maintaining perfect  inhaler technique:  relax and gently blow all the way out then take a nice smooth deep breath back in, triggering the inhaler at same time you start breathing in.  Hold for up to 5 seconds if you can. Blow dulera out thru nose. Rinse and gargle with water when done  For flare of breathing problems that don't respond to xopenex or persistent cough > Prednisone 10 mg take  4 each am x 2 days,   2 each am x 2 days,  1 each am x 2 days and stop  For nasty mucus > zpak  Please schedule a follow up visit in 3 months but call sooner if needed     01/09/2019  f/u ov/Daryel Kenneth re: GOLD 0/ copd maint empirical = duler 200/ spiriva Chief Complaint  Patient presents with  . Follow-up    Prod cough and nasal d/c- green in color for the past month. She is using her xopenex inhaler once per wk on average.   Dyspnea:  No change doe = MMRC3 = can't walk 100 yards even at a slow pace at a flat grade s stopping due to sob  Even on 3lpm  Cough: first thing in  am or if talks alot Sleeping: "90 degrees"  = baseline, says can't lie back due to sob  SABA use: as above 02: 3lpm 24/7  rec For discolored / bloody or nasty mucus > zpak For wheezing / short of breath, increased need for xopenex > Prednisone 10 mg take  4 each am x 2 days,   2 each am x 2 days,  1 each am x 2 days and stop    05/12/2019  f/u ov/Terrian Ridlon re:  GOLD 0 / maint dulera 200/ spiriva and 02 pt purchased as did not qualify  Chief Complaint  Patient presents with  . Follow-up    Overall doing well but having minimal chest tightness and wheezing. She has used her xopenex inhaler a few times recently.   Dyspnea:  Walking across house then stop , to mb and back stop half way Cough: variable - flares self rx with short course pred./ zpak successfully / not recently  Sleeping: baseline 90 SABA use: rarely 02: 3lpm 24/7  rec No change rx    12/11/2019  f/u ov/Amea Mcphail re: copd/ unable to do pfts  Chief Complaint  Patient presents with  . Follow-up    6 month f/u up for COPD. States her breathing has been stable since last visit.   Dyspnea:  Able to walk around the house now on 3lpm  Cough: no prednisone / zpak  Sleeping: 90 degrees o/w coughing SABA use: avg once a day  02: 3lpm 24/7 despite not qualifying for 02  rec No change in your medications or 02    08/06/2020  f/u ov/Kayleana Waites re: ? Doylene Canning 0/  unable to do pfts maint on spiriva/ dulera 200 / has prn prednisone but not using  Chief Complaint  Patient presents with  . Follow-up    Patient is doing much better since last visit. States that she mostly only wearing oxygen at night and as needed during the day.   Dyspnea: walks hallways now where used to be in w/c  Attributes this to the right diet  Cough: minimal  Sleeping: 90 degrees hosp bed  SABA use: not needing  02: 3lpm hs and prn daytime with sats > 90% RA though not checking at peak ex    No obvious day to day or daytime variability or assoc excess/ purulent sputum or mucus  plugs or hemoptysis or cp or chest tightness, subjective wheeze or overt sinus or hb symptoms.   sleeping as above without nocturnal  or early am exacerbation  of respiratory  c/o's or need for noct saba. Also denies any obvious fluctuation of symptoms with weather or environmental changes or other aggravating or alleviating factors except as outlined above   No unusual exposure hx or h/o childhood pna/ asthma or knowledge of premature birth.  Current Allergies, Complete Past Medical History, Past Surgical History, Family History, and Social History were reviewed in [employer] record.  ROS  The following are not active complaints unless bolded Hoarseness, sore throat, dysphagia, dental problems, itching, sneezing,  nasal congestion or discharge of excess mucus or purulent secretions, ear ache,   fever, chills, sweats, unintended wt loss or wt gain, classically pleuritic or exertional cp,  orthopnea pnd or arm/hand swelling  or leg swelling, presyncope, palpitations, abdominal pain, anorexia, nausea, vomiting, diarrhea  or change in bowel habits or change in bladder habits, change in stools or change in urine, dysuria, hematuria,  rash, arthralgias, visual complaints, headache, numbness, weakness or ataxia or problems with walking or coordination,  change in mood or  memory.        Current Meds  Medication Sig  . acetaminophen (TYLENOL) 325 MG tablet Take 650 mg by mouth every 6 (six) hours as needed.  Marland Kitchen azithromycin (ZITHROMAX) 250 MG tablet Take 1 tablet (250 mg total) by mouth as needed (for copd flare up).  . bisoprolol (ZEBETA) 5 MG tablet Take 0.5 tablets (2.5 mg total) by mouth daily.  . irbesartan (AVAPRO) 75 MG tablet TAKE 1 TABLET BY MOUTH DAILY.  Marland Kitchen levalbuterol (XOPENEX HFA) 45 MCG/ACT inhaler Inhale up to 2 puffs every 4 hours as needed for shortness of breath or wheezing  . MAGNESIUM PO Take 1 tablet by mouth daily.  . mometasone-formoterol (DULERA) 200-5  MCG/ACT AERO INHALE 2 PUFFS BY MOUTH IN THE MORNING AND 2 PUFFS 12 HOURS LATER  . omeprazole (PRILOSEC) 20 MG capsule Take 40 mg by mouth 2 (two) times daily before a meal.  . OXYGEN 3 L at bedtime. Patient wear oxygen at night all the time and as needed during the day  . predniSONE (DELTASONE) 10 MG tablet Take 1 tablet (10 mg total) by mouth as needed (for copd flare up).  . SPIRIVA RESPIMAT 2.5 MCG/ACT AERS INHALE 2 SPRAY(S) BY MOUTH ONCE DAILY IN THE MORNING  . Zinc 30 MG CAPS Take 1 capsule by mouth daily.                  Objective:   Physical Exam     08/06/2020       105 12/11/2019       105  05/12/2019  113   01/09/2019      112     08/17/13 113 lb (51.256 kg)  07/18/13 114 lb 6.4 oz (51.891 kg)  06/21/13 112 lb (50.803 kg)     Vital signs reviewed  08/06/2020  - Note at rest 02 sats  95% on RA   General appearance:   Upbeat/ now amb wf nad   HEENT : pt wearing mask not removed for exam due to covid - 19 concerns.   NECK :  without JVD/Nodes/TM/ nl carotid upstrokes bilaterally   LUNGS: no acc muscle use,  Min barrel  contour chest wall with bilateral  slightly decreased bs s audible wheeze and  without cough on insp or exp maneuvers and min  Hyperresonant  to  percussion bilaterally     CV:  RRR  no s3 or murmur or increase in P2, and no edema   ABD:  soft and nontender with pos end  insp Hoover's  in the supine position. No bruits or organomegaly appreciated, bowel sounds nl  MS:   Nl gait/  ext warm without deformities, calf tenderness, cyanosis or clubbing No obvious joint restrictions   SKIN: warm and dry without lesions    NEURO:  alert, approp, nl sensorium with  no motor or cerebellar deficits apparent.               Assessment & Plan:

## 2020-08-07 ENCOUNTER — Other Ambulatory Visit (HOSPITAL_COMMUNITY): Payer: Self-pay | Admitting: Cardiology

## 2020-08-09 ENCOUNTER — Encounter: Payer: Self-pay | Admitting: *Deleted

## 2020-09-06 ENCOUNTER — Ambulatory Visit (HOSPITAL_COMMUNITY)
Admission: RE | Admit: 2020-09-06 | Discharge: 2020-09-06 | Disposition: A | Discharge status: Home / Self Care | Payer: 59 | Source: Ambulatory Visit | Attending: Cardiology | Admitting: Cardiology

## 2020-09-06 ENCOUNTER — Other Ambulatory Visit: Payer: Self-pay

## 2020-09-06 ENCOUNTER — Encounter (HOSPITAL_COMMUNITY): Payer: Self-pay | Admitting: Cardiology

## 2020-09-06 VITALS — BP 104/58 | HR 70 | Wt 106.2 lb

## 2020-09-06 DIAGNOSIS — I5022 Chronic systolic (congestive) heart failure: Secondary | ICD-10-CM | POA: Insufficient documentation

## 2020-09-06 DIAGNOSIS — Z87891 Personal history of nicotine dependence: Secondary | ICD-10-CM | POA: Diagnosis not present

## 2020-09-06 DIAGNOSIS — J449 Chronic obstructive pulmonary disease, unspecified: Secondary | ICD-10-CM | POA: Diagnosis not present

## 2020-09-06 DIAGNOSIS — M797 Fibromyalgia: Secondary | ICD-10-CM | POA: Diagnosis not present

## 2020-09-06 DIAGNOSIS — R1900 Intra-abdominal and pelvic swelling, mass and lump, unspecified site: Secondary | ICD-10-CM | POA: Diagnosis not present

## 2020-09-06 DIAGNOSIS — R0602 Shortness of breath: Secondary | ICD-10-CM | POA: Diagnosis not present

## 2020-09-06 DIAGNOSIS — G709 Myoneural disorder, unspecified: Secondary | ICD-10-CM | POA: Diagnosis not present

## 2020-09-06 DIAGNOSIS — Z79899 Other long term (current) drug therapy: Secondary | ICD-10-CM | POA: Diagnosis not present

## 2020-09-06 DIAGNOSIS — Z8249 Family history of ischemic heart disease and other diseases of the circulatory system: Secondary | ICD-10-CM | POA: Diagnosis not present

## 2020-09-06 DIAGNOSIS — Z7951 Long term (current) use of inhaled steroids: Secondary | ICD-10-CM | POA: Diagnosis not present

## 2020-09-06 HISTORY — DX: Heart failure, unspecified: I50.9

## 2020-09-06 LAB — LIPID PANEL
Cholesterol: 187 mg/dL (ref 0–200)
HDL: 59 mg/dL (ref >40)
LDL Cholesterol: 108 mg/dL — ABNORMAL HIGH (ref 0–99)
Total CHOL/HDL Ratio: 3.2 RATIO
Triglycerides: 102 mg/dL (ref <150)
VLDL: 20 mg/dL (ref 0–40)

## 2020-09-06 LAB — BASIC METABOLIC PANEL
Anion gap: 8 (ref 5–15)
BUN: 13 mg/dL (ref 6–20)
CO2: 27 mmol/L (ref 22–32)
Calcium: 9.7 mg/dL (ref 8.9–10.3)
Chloride: 101 mmol/L (ref 98–111)
Creatinine, Ser: 0.61 mg/dL (ref 0.44–1.00)
GFR, Estimated: >60 mL/min (ref >60)
Glucose, Bld: 95 mg/dL (ref 70–99)
Potassium: 3.8 mmol/L (ref 3.5–5.1)
Sodium: 136 mmol/L (ref 135–145)

## 2020-09-06 MED ORDER — IRBESARTAN 75 MG PO TABS: 75.0000 mg | ORAL_TABLET | Freq: Every day | ORAL | 11 refills | Status: DC

## 2020-09-06 MED ORDER — BISOPROLOL FUMARATE 5 MG PO TABS: 2.5000 mg | ORAL_TABLET | Freq: Every day | ORAL | 11 refills | Status: DC

## 2020-09-06 NOTE — Patient Instructions (Signed)
EKG done today.  Labs done today. We will contact you only if your labs are abnormal.  No medication changes were made. Please continue all current medications as prescribed.  Your physician recommends that you schedule a follow-up appointment in: 1 year. Please contact our office in February 2023 to schedule a March appointment.   If you have any questions or concerns before your next appointment please send [place] a message through [place] or call our office at [phone].    TO LEAVE A MESSAGE FOR THE NURSE SELECT OPTION 2, PLEASE LEAVE A MESSAGE INCLUDING: . YOUR NAME . DATE OF BIRTH . CALL BACK NUMBER . REASON FOR CALL**this is important as we prioritize the call backs  YOU WILL RECEIVE A CALL BACK THE SAME DAY AS LONG AS YOU CALL BEFORE 4:00 PM   Do the following things EVERYDAY: 1) Weigh yourself in the morning before breakfast. Write it down and keep it in a log. 2) Take your medicines as prescribed 3) Eat low salt foods-Limit salt (sodium) to 2000 mg per day.  4) Stay as active as you can everyday 5) Limit all fluids for the day to less than 2 liters   At the [facility], you and your health needs are our priority. As part of our continuing mission to provide you with exceptional heart care, we have created designated Provider Care Teams. These Care Teams include your primary Cardiologist (physician) and Advanced Practice Providers (APPs- Physician Assistants and Nurse Practitioners) who all work together to provide you with the care you need, when you need it.   You may see any of the following providers on your designated Care Team at your next follow up: Marland Kitchen Dr Arvilla Meres . Dr Marca Ancona . Tonye Becket, NP . Robbie Lis, PA . Karle Plumber, PharmD   Please be sure to bring in all your medications bottles to every appointment.

## 2020-09-08 NOTE — Progress Notes (Signed)
Patient ID: [name], female   DOB: 09-06-1960, 60 y.o.   MRN: [id number] PCP: Dr. Corliss Blacker Cardiology: Dr. Shirlee Latch  60 y.o. with complicated past medical history presents for evaluation of her cardiomyopathy.  She has a long history of neuromuscular disorder.  The exact diagnosis remains undetermined.  She has been confined to a wheelchair or a walker.  In 12/13, she was admitted to [facility] with hypotension, hypothermia, and altered mental status.   She was found to have profound anemia with hemoglobin down to 3.  She was transfused 4 units.  She also was found to have Enterococcal UTI and Serratia PNA.  She was intubated and eventually required tracheostomy.  During her hospitalization, she was found to have a cardiomyopathy with EF 20-25% by echo.  She was treated with milrinone.  Repeat echo in 5/14 showed EF improved to 50-55% with abnormal septal motion and last echo in 9/14 showed EF 55-60%.    Previously lasix was increased to 40 mg in am and 20 mg in pm along with 20 meq of potassium. I offered her a RHC however she declined. PFTs obtained as noted below and showed severe airway obstruction with subsequent evaluation by Dr [name].  At that time Dr [name] switched lisinopril to irbesartan. He felt that despite severity of her COPD, main limitation was her neuromuscular condition. Event monitor was worn in 1/15 due to tachypalpitations.  This showed PACs.  Event monitor was worn again in 12/17, this again showed PACs with short runs of atrial tachycardia.   Echo in 10/20 showed EF 60-65%, normal RV.   She returns for followup of CHF and palpitations.  She continues to have periodic exacerbations of COPD/asthma for which she is given steroids. She is improving in terms of functional capacity, has been walking without a walker x 1.5 months. Still with generalized weakness.  Using oxygen prn and at night. No longer taking Lasix.  No significant exertional dyspnea, no chest pain.   ECG (personally  reviewed): NSR, normal  Labs (1/14): K 3.9, creatinine 0.21, HCT 30 Labs (4/14): K 4, creatinine 0.4, BNP 19 Labs (10/14): K 3.5, creatinine 0.5 Labs (12/14): K 3.6, creatinine 0.4 Labs (07/28/12): K 3.5 creatinine 0.5 Labs (08/01/13): K 3.5 Creatinine 0.5 Pro BNP 129 Labs (2/16): K 3.8, creatinine 0.44 Labs (9/16): K 3.6, creatinine 0.52, BNP 14 Labs (11/17): K 4.6, creatinine 0.53, BNP 16 Labs (5/18): K 3.7, creatinine 0.53 Labs (1/19): K 3.7, creatinine 0.56 Labs (12/19): K 3.9, creatinine 0.57 Labs (9/20): BNP 21 Labs (4/21): K 4.1, creatinine 0.6  PMH: 1. Anorexia/cachexia/failure to thrive: Patient had a PEG tube.  2. Neuromuscular disease: exact diagnosis is still undetermined despite extensive neurology evaluation.  She does not have multiple sclerosis.  3. Fibromyalgia 4. IBS 5. TMJ syndrome 6. PTSD 7. GERD 8. Sacral decubitus ulcer 9. H/o aspiration 10. 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  Echo (12/13) with EF 20-25% with diffuse hypokinesis, mild MR, moderate RV dilation and moderate RV dysfunction.  Echo (5/14) with EF 50-55%, abnormal septal motion.  Echo (9/14) with EF 55-60%.   - Echo (5/17): EF 60-65%.  - Echo (7/18): EF 60-65%, normal RV.  - Echo (10/20): EF 60-65%, normal RV 11. Anemia 12. COPD/asthma: PFTs 08/01/13 with FEV1 0.98 liters (33%), FVC 1.87 (50%), FEV 1/FVC 52%, FEF 25-75% 0.65 liter.  - She is on home oxygen.  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  Palpitations: Event monitor in 12/17 showed PACs and short runs of atrial tachycardia.  - 2/19 event monitor: She only wore it for a few days.  No significant arrhythmias.   SH: Lives with husband in [place].  Quit smoking > 20 years ago.  No ETOH.     FH: No history of cardiomyopathy.  Grandfather had MI at 43.   ROS: All systems reviewed and negative except as per HPI.    Current Outpatient  Medications  Medication Sig Dispense Refill  . acetaminophen (TYLENOL) 325 MG tablet Take 650 mg by mouth every 6 (six) hours as needed.    Marland Kitchen azithromycin (ZITHROMAX) 250 MG tablet Take 1 tablet (250 mg total) by mouth as needed (for copd flare up). 6 each 11  . levalbuterol (XOPENEX HFA) 45 MCG/ACT inhaler Inhale up to 2 puffs every 4 hours as needed for shortness of breath or wheezing 1 each 5  . Magnesium 250 MG TABS Take 250 mg by mouth daily.    . mometasone-formoterol (DULERA) 200-5 MCG/ACT AERO INHALE 2 PUFFS BY MOUTH IN THE MORNING AND 2 PUFFS 12 HOURS LATER 13 g 11  . omeprazole (PRILOSEC) 20 MG capsule Take 40 mg by mouth 2 (two) times daily before a meal.    . OXYGEN 3 L at bedtime. Patient wear oxygen at night all the time and as needed during the day    . predniSONE (DELTASONE) 10 MG tablet Take 1 tablet (10 mg total) by mouth as needed (for copd flare up). 100 tablet 2  . Tiotropium Bromide Monohydrate (SPIRIVA RESPIMAT) 2.5 MCG/ACT AERS 2 pffs each am 4 g 11  . Zinc 30 MG CAPS Take 1 capsule by mouth daily.     . bisoprolol (ZEBETA) 5 MG tablet Take 0.5 tablets (2.5 mg total) by mouth daily. 15 tablet 11  . irbesartan (AVAPRO) 75 MG tablet Take 1 tablet (75 mg total) by mouth daily. 30 tablet 11   No current facility-administered medications for this encounter.    BP (!) 104/58   Pulse 70   Wt 48.2 kg (106 lb 3.2 oz)   SpO2 97%   BMI 18.23 kg/m  General: NAD, thin Neck: No JVD, no thyromegaly or thyroid nodule.  Lungs: Clear to auscultation bilaterally with normal respiratory effort. CV: Nondisplaced PMI.  Heart regular S1/S2, no S3/S4, no murmur.  No peripheral edema.  No carotid bruit.  Normal pedal pulses.  Abdomen: Soft, nontender, no hepatosplenomegaly, no distention.  Skin: Intact without lesions or rashes.  Neurologic: Alert and oriented x 3.  Psych: Normal affect. Extremities: No clubbing or cyanosis.  HEENT: Normal.   Assessment/Plan: 1. Cardiomyopathy:  Last echo in 10/20 was normal.  I suspect that her prior low EF was due to a stress (Takotsubo-type) cardiomyopathy.  She is not volume overloaded on exam.  She has chronic NYHA class II-III symptoms, but think this may be due more to COPD/asthma. - She can continue current bisoprolol and irbesartan.     - BMET today.   2. Dyspnea: NYHA class II-III symptoms, chronic. Suspect combination of COPD/asthma and deconditioning due to neuromuscular disorder.  As above, doubt active CHF.  She is followed closely by Dr [name]. She is wearing oxygen prn and at night.  She has periodic flares where she will take prednisone.  3. Neuromuscular disorder: Not fully characterized.  She used to be in a wheelchair, then a walker, now off the walker.  I have offered to refer her back to  neurology in the past but she declined.   Followup in 1 year.    Kishana Battey,MD 09/08/2020

## 2020-12-12 ENCOUNTER — Other Ambulatory Visit: Payer: Self-pay | Admitting: Family Medicine

## 2020-12-12 DIAGNOSIS — M810 Age-related osteoporosis without current pathological fracture: Secondary | ICD-10-CM

## 2020-12-19 ENCOUNTER — Ambulatory Visit
Admission: RE | Admit: 2020-12-19 | Discharge: 2020-12-19 | Disposition: A | Discharge status: Home / Self Care | Payer: 59 | Source: Ambulatory Visit | Attending: Family Medicine | Admitting: Family Medicine

## 2020-12-19 ENCOUNTER — Other Ambulatory Visit: Payer: Self-pay

## 2020-12-19 DIAGNOSIS — M810 Age-related osteoporosis without current pathological fracture: Secondary | ICD-10-CM

## 2020-12-31 ENCOUNTER — Ambulatory Visit (INDEPENDENT_AMBULATORY_CARE_PROVIDER_SITE_OTHER): Payer: 59 | Admitting: Family Medicine

## 2020-12-31 ENCOUNTER — Other Ambulatory Visit: Payer: Self-pay

## 2020-12-31 DIAGNOSIS — Z713 Dietary counseling and surveillance: Secondary | ICD-10-CM

## 2020-12-31 NOTE — Patient Instructions (Addendum)
Foods to add for energy and nutrition:  - Almond or other nut/seed butters, i.e., hemp, cashew, sunflower, peanut)   (Try stirring oil-on-top nut butter, then refrigerate to keep blended.) - Other grains such as barley, amaranth, or quinoa, but start with no more than 1/2 cup cooked at a time.   - Fresh fruit (besides blueberries and pureed prunes), e.g., peaches, pears, cantaloupe.    - Olive or other high-quality oil.  (Start with a teaspoon at a time, added to blender drinks.)  Getting more variety:  - Other dairy milk alternatives like rice or oat milk, hemp milk.  Any of these would make a good alternative to the saturated fat in the coconut milk.    Additionally:  - Introduce only one new food at a time, and try it for 2 weeks before moving on to another new food.   - Document added foods and any noticeable response.   - Lightly cooking the broccoli in your blender drink may make it easier to digest.    NOTE: If you have abdominal discomfort after eating a new food or larger amount of food than usual, this is not necessarily bad.  After prolonged food restriction, the GI system needs to adapt to normal eating again.  Think of your GI tract as a muscular system that gets "de-trained" with food restriction.  In addition, as you increase variety and volume of food, your body needs to start ramping up production of enzymes, neurotransmitters, and hormones involved in digestion.  Lastly, when you starve yourself through restrictive eating, you are also starving your gut microbes.  Just as it takes some time to increase production of digestive substances, it takes time for gut microbes to colonize and multiply, ultimately fulfilling a very important role in healthy digestion.  After a period of food restriction, it is usually necessary to "eat beyond discomfort" as the body re-adapts to normal food intake.    Follow-up video appt on Thursday, August 11 at 2 PM.

## 2020-12-31 NOTE — Progress Notes (Signed)
Telehealth Encounter (Text link [phone] ) PCP Gweneth Dimitri, MD I connected with Dalaysia Harms (MRN [id number]) on 12/31/2020 by MyChart video-enabled, HIPAA-compliant telemedicine application, verified that I was speaking with the correct person using two identifiers, and that the patient was in a private environment conducive to confidentiality.  The patient agreed to proceed.  Persons participating in visit were patient and provider (registered dietitian) Linna Darner, PhD, RD, LDN, CEDRD.  Provider was located at [facility] during this telehealth encounter; patient was at home.  Appt start time: 1500 end time: 1600 (1 hour)  Reason for telehealth visit: Referred by Gweneth Dimitri, MD for Medical Nutrition Therapy related to anorexia nervosa.  Also has diagnoses of osteoporosis, malnutrition, cardiomyopathy, CHF, GERD, dysphagia, severe TMJ, and COPD.  Relevant history/background: Ms. Dumas has a h/o anorexia nervosa starting at ~age 28.  She has managed in recent years to expand previously narrow range of foods she will eat, although this is still very narrow.  She has swallowing and chewing problems that limit some foods.  In 2013 she was hospitalized with severe malnutrition, multiple organ failure, including CHF.  Following that hospitalization, at 87 lb and on a feeding tube, Ms. Bonnell managed to increase her intake (3 Ensures/day, 2 Ensure puddings, and multiple processed foods), and gain to 120 lb.  Her cardiologist, however, became concerned about her Na intake and fluid retention.  When she cut down on those foods, she lost down to ~99 lb.  She wants to gain weight while maintaining a heart-healthy diet.   Assessment: Ms. Prochazka changed her diet a lot about a year ago, greatly reducing sugar and minimizing saturated fat, concerned about cardiac health.  She eats the exact same foods daily (see food recall).   Weight: 101 lb (self-report); ht is 64".    Usual eating pattern: 3 meals and 2 snacks per day. Frequent foods and beverages: water, home-prepared drinks (blender drinks: 1 c alm or coconut milk, 2 tbsp hemp pro pdr (15 g pro) , 1 tbsp flax meal, 1/2 c cooked lentils OR other blender drink: 1 c alm milk 1 c blueberry, 1/2 c raw broc, 2 tbsp alm butter.  Also eats baby food purees: swt potatoes, prunes,and consumes 3 X day: 1 c green beans with olive oil, 1/3 c blk-eyed peas, 1 egg white. Avoided foods: inflammatory foods (sugar, high-glycemic foods, dairy, egg yolks), flesh foods, soy milk/foods.   Usual physical activity: Walks around home intermittently each day.  (Has been off walker ~8 months.) Sleep: Estimates average of 5 hours of sleep/night.  Sleeps in hospital bed b/c of damage from pressure wounds.  Bed is at 90-degree angle d/t CHF.   24-hr recall:  (Up at 6:30 AM; 1 c water) B (8:30 AM)-  Blender drink: 1 c alm milk, 2 tbsp hemp pro pdr (15 g pro) , 1 tbsp flax meal, 1/2 c cooked lentils; 1 jar each baby food swt potatoes, prunes.   Snk (10:15)-  1 c water L (12 PM)-  1 c green beans with olive oil, 1/3 c blk-eyed peas, 1 egg white, Blueberry blender drink: 1 c alm milk ,1 c blueberries, 1/2 c raw broc, 2 tbsp alm butter. Snk (2 PM)-  Blender drink: 1 c coconut milk, 2 tbsp hemp pro pdr (15 g pro) , 1 tbsp flax meal, 1/2 c cooked lentils. Snk (4 PM)-  1 c green beans with olive oil, 1/3 c blk-eyed peas, 1 egg white, blueberry  blender drink: 1 c alm milk ,1 c blueberries, 1/2 c raw broc, 2 tbsp alm butter. D (6:30 PM)-  1 c green beans with olive oil, 1/3 c blk-eyed peas, 1 egg white, blueberry blender drink: 1 c alm milk ,1 c blueberries, 1/2 c raw broc, 2 tbsp alm butter, water. Snk (11 PM)-  1 c water Typical day? Yes.    Nutritional profile 1798 kcal; sat fat 10.75 g, total fat 65.15 g, protein 79.4 g, Na 1070 mg.    Intervention: Completed diet and exercise history, and established behavioral goals.   For recommendations  and goals, see Patient Instructions.    Follow-up: Video appt in 6 weeks.    Britni Driscoll,JEANNIE

## 2021-02-04 ENCOUNTER — Ambulatory Visit (INDEPENDENT_AMBULATORY_CARE_PROVIDER_SITE_OTHER): Payer: 59 | Admitting: Internal Medicine

## 2021-02-04 ENCOUNTER — Other Ambulatory Visit: Payer: Self-pay

## 2021-02-04 ENCOUNTER — Encounter: Payer: Self-pay | Admitting: Internal Medicine

## 2021-02-04 DIAGNOSIS — J449 Chronic obstructive pulmonary disease, unspecified: Secondary | ICD-10-CM | POA: Diagnosis not present

## 2021-02-04 NOTE — Assessment & Plan Note (Addendum)
Quit smoking 1989 - see f/v loops 08/01/13 c/w upper airway obstruction > refused to repeat off acei  - trial off acei 08/18/2013 > marked improvement - 09/15/2013   Walked RA x about 50 ft and stopped due to  Fatigue s sob or desat - Spiriva trial 09/15/2013 > improved 10/27/13 so continue - added flutter valve 09/05/2014  - 10/25/2014  added dulera 100 2bid based on reported improvement with prednisone> much better - requiring prn pred since 05/2015 s documentation it helps (refuses pfts)  - V/q 04/18/2018   wnl ( not typical of copd and neg for PE)  . 09/07/2018  After extensive coaching inhaler device,  effectiveness =    90%    Group D in terms of symptom/risk and laba/lama/ICS  therefore appropriate rx at this point >>>  Continue dulera 200/ spriva and prn saba   No need for amb 02 by her self monitoring/ gaining some momentum with activity daily / encouraged         Each maintenance medication was reviewed in detail including emphasizing most importantly the difference between maintenance and prns and under what circumstances the prns are to be triggered using an action plan format where appropriate.  Total time for H and P, chart review, counseling, reviewing hfa/smi device(s) and generating customized AVS unique to this office visit / same day charting =  20 min

## 2021-02-04 NOTE — Progress Notes (Signed)
Subjective:   Patient ID: [name], female    DOB: December 31, 1960   MRN: [id number]    Brief patient profile:  59 yowf quit smoking 1989 then ? MS dx'd early 61's by [facility] (though pt states flatly she doesn't have MS)  and "it's been steadily downhill since" mostly muscle related but since 2012 also with breathing difficulty > admit:   Admit date: 06/06/2012  Discharge date: 06/20/2012  Discharge Diagnoses:  Acute respiratory failure  CAP (community acquired pneumonia) vs aspiration pneumonia  Acute encephalopathy, resolved  Neuromuscular disorder: not fully defined.  Anemia  Thrombocytopenia  Coagulopathy  Acute liver failure  Enterococcal UTI (pansensitive)  Shock circulatory, suspect primarily cardiogenic  Sacral Pressure Ulcer  Dilated cardiomyopathy  Malnutrition compromising bodily function  H/O anorexia nervosa  Dysphagia    History of Present Illness  08/17/2013 1st [facility] office visit/ [name] was able do some adl/s with walker and no 02 summer of 2014  then downhill since  And now sob at rest  X 2 weeks and able to lie down at 45 degrees and can't tol lying flat at all for sev weeks assoc with dry cough day and night and dysphagia/ choking sensation on acei.  Already on gerd rx/ inhaler no benefit.  rec Stop lisinopril Avapro 75 mg one daily  Prilosec 20 mg Take 30- 60 min before your first and last meals of the day  GERD diet     09/15/2013 f/u ov/[name] re: acei vs copd  Chief Complaint  Patient presents with   Follow-up    Pt states that her SOB/cough has improved greatly.  Pt still c/o SOB but is much better since lv.    limited more by fatigue and weakness than sob, swallowing fine now  rec spiriva 2puffs each am to see if your activity tolerance improves> improved so maint rx    09/05/2014 acute ov/[name] re: worse sob/increase need for saba on spiriva maint rx  Chief Complaint  Patient presents with   Acute Visit    Pt c/o increased  SOB- started mid Nov 2015, worse for the past month. She is using xopenex 2-3 x per day on average. She c/o cough for the past month- sometimes prod with clear yellow/green sputum. She has had some occ bloody nasal d/c.  Has not been able to lie down for several years, sitting upright in hosp bed due to sob Swallowing worse "because cough is worse" despite  on prilosec 20 bid with meals  Denies choking but all foods are pureed. rec Change omeprazole to where you take 20mg  x 2 Take 30- 60 min before your first and last meals of the day  GERD   Try flutter valve as needed for cough or congestion > ok to cough into the valve if helps    10/25/2014 f/u ov/[name] re: mild copd/ MS with chronic dysphagia/ off pred x 21 days prior to OV   Chief Complaint  Patient presents with   Acute Visit    Pt c/o decreased o2 sats since 09/26/14- feels fatigued and more SOB. She states that she is also aspirating more and got choked drinking water recently.   while on prednisone did not use any xopenex hfa but prior was using it up 3 x daily  On prednisone cough also goes away  But worse in am's without it. Thinks she may be aspirating more since Nov 2015 >   Convinced her copd is progressing based on her readings  Mucus is minimal, no food in it rec Please see patient coordinator before you leave today  to schedule overnight oximetry> neg   modified barium swallow>MBS 10/29/2014  Diet Recommendations Dysphagia 1 (Puree);Thin liquid;Dysphagia 2  (Fine chop)  Liquid Administration via Cup  Medication Administration Crushed with puree  Compensations Slow rate;Small sips/bites;Multiple dry swallows  after each bite/sip  Postural Changes and/or Swallow Maneuvers Seated upright 90  degrees;Chin tuck Add automatically Dulera 100 Take 2 puffs first thing in am and then another 2 puffs about 12 hours later.  Plan B = backup  Only use your levoalbuterol  If needed  Cough into the flutter valve if having trouble  getting anything up       06/04/2015  f/u ov/[name] re:  Probable copd on spiriva and dulera 100 2bid Chief Complaint  Patient presents with   Follow-up    Pt c/o increased SOB for the 3 wks. She states that she coughs more when she talks and produces clear sputum.  She has been using her o2 more, and xopenex 1-2 x per wk.   Uses 02 daytime prn  Never uses at hs  Not seeing neuro any more / "the only reason my muscles get worse is that my breathing gets worse" swallowing no change. Prednisone really helps breathing  rec Increase dulera to 200 Take 2 puffs first thing in am and then another 2 puffs about 12 hours later.  02 2lpm at bedtime and during the day goal is  to keep above 90% so ok to adjust up down or off to achieve that goal Prednisone 10 mg take  4 each am x 2 days,   2 each am x 2 days,  1 each am x 2 days and stop   07/12/15 requested pred rx p perfume exp     04/12/2018 acute extended ov/[name] SE:LTRVU sob  since late August 2019  Chief Complaint  Patient presents with   Acute Visit    She states her breathing is not improving since the last visit. She is wheezing some. She states "there is a lack of expansion in my lungs". She is using her xopenex inhaler every morning to help with wheezing.   usual pattern  cough/ wheeze tried prednisone late Aug 2019 this time no better, some better at least for four hours p xopenex hfa never more than a few times a week but did not respond to prednisone and in fact worse then hemoptysis/cp w/in a few days of ov with NP 03/30/18 who rec Continue prednisone taper Continue xopenex, dulera, and spiriva Zpak and increase 02 if needed to 3lpm > smidge better  starting 10 d prior to OV using xopenex with am dulera / spiriva and increased 02 to 3lpm and restarted prednisone 3 d prior to OV   Cp varies posteriorly = on both sides not really pleuritic but p coughing fits feels sore rec Only use your Levoalbuterol as a rescue medication  Work on  inhaler technique:    Please see patient coordinator before you leave today  to schedule CTa of chest > changed to v/q due to contrast allergy  Take delsym two tsp every 12 hours and supplement if needed with  Demerol 50 mg up to 1  every 4 hours to suppress the urge to cough.  Once you have eliminated the cough for 3 straight days try reducing the demerol  first,  then the delsym as tolerated.      06/07/2018  f/u  ov/[name] re: copd  GOLD 0 / uacs  Chief Complaint  Patient presents with   Follow-up    Breathing has improved some. She states still does not have much stamina. She has not needed her xopenex inhaler.   Dyspnea:   15 ft and stops/ 02 helps despite absence of desats documented  Cough: better, does seem to respond to prednisone - restarts every 2-3 weeks  X 6 days desptite dulera 200 /spiriva  Sleeping: 90 degrees hosp bed  SABA use: none  02: 3lpm 24/7   Nasal discharge better p zpak  No more cp or hemoptysis  rec Work on maintaining perfect  inhaler technique:  relax and gently blow all the way out then take a nice smooth deep breath back in, triggering the inhaler at same time you start breathing in.  Hold for up to 5 seconds if you can. Blow dulera out thru nose. Rinse and gargle with water when done  For flare of breathing problems that don't respond to xopenex or persistent cough > Prednisone 10 mg take  4 each am x 2 days,   2 each am x 2 days,  1 each am x 2 days and stop  For nasty mucus > zpak  Please schedule a follow up visit in 3 months but call sooner if needed     01/09/2019  f/u ov/[name] re: GOLD 0/ copd maint empirical = duler 200/ spiriva Chief Complaint  Patient presents with   Follow-up    Prod cough and nasal d/c- green in color for the past month. She is using her xopenex inhaler once per wk on average.   Dyspnea:  No change doe = MMRC3 = can't walk 100 yards even at a slow pace at a flat grade s stopping due to sob  Even on 3lpm  Cough: first thing in am  or if talks alot Sleeping: "90 degrees"  = baseline, says can't lie back due to sob  SABA use: as above 02: 3lpm 24/7  rec For discolored / bloody or nasty mucus > zpak For wheezing / short of breath, increased need for xopenex > Prednisone 10 mg take  4 each am x 2 days,   2 each am x 2 days,  1 each am x 2 days and stop    05/12/2019  f/u ov/[name] re:  GOLD 0 / maint dulera 200/ spiriva and 02 pt purchased as did not qualify  Chief Complaint  Patient presents with   Follow-up    Overall doing well but having minimal chest tightness and wheezing. She has used her xopenex inhaler a few times recently.   Dyspnea:  Walking across house then stop , to mb and back stop half way Cough: variable - flares self rx with short course pred./ zpak successfully / not recently  Sleeping: baseline 90 SABA use: rarely 02: 3lpm 24/7  rec No change rx    12/11/2019  f/u ov/[name] re: copd/ unable to do pfts  Chief Complaint  Patient presents with   Follow-up    6 month f/u up for COPD. States her breathing has been stable since last visit.   Dyspnea:  Able to walk around the house now on 3lpm  Cough: no prednisone / zpak  Sleeping: 90 degrees o/w coughing SABA use: avg once a day  02: 3lpm 24/7 despite not qualifying for 02  rec No change in your medications or 02    08/06/2020  f/u ov/[name] re: ? [name] 0/  unable to do pfts maint on spiriva/ dulera 200 / has prn prednisone but not using  Chief Complaint  Patient presents with   Follow-up    Patient is doing much better since last visit. States that she mostly only wearing oxygen at night and as needed during the day.   Dyspnea: walks hallways now where used to be in w/c  Attributes this to the right diet  Cough: minimal  Sleeping: 90 degrees hosp bed  SABA use: not needing  02: 3lpm hs and prn daytime with sats > 90% RA though not checking at peak ex  Rec Make sure you check your oxygen saturation  at your highest level of activity  to be sure  it stays over 90% and adjust  02 flow upward to maintain this level if needed but remember to turn it back to previous settings when you stop (to conserve your supply).  No change in medications Zostrix cream is over the counter - start with small bedtime dose > did not use it but improved       02/04/2021  f/u ov/[name] re:  GOLD 0/ unable to do pfts / spiriva /dulera 200  has pred/zmax not needed since last ov  Chief Complaint  Patient presents with   Follow-up    Copd doing well on 3L at night. Daytime as needed.    Dyspnea:  doing the hallways at appt - lowest sat 90s Cough: struggles some due to dysphagia  Sleeping: still 90 degrees  SABA use: rarely 02: 3lpm hs  Covid status:   4 total vaccinations   No obvious day to day or daytime variability or assoc excess/ purulent sputum or mucus plugs or hemoptysis or cp or chest tightness, subjective wheeze or overt sinus or hb symptoms.   Sleeping as above  without nocturnal  or early am exacerbation  of respiratory  c/o's or need for noct saba. Also denies any obvious fluctuation of symptoms with weather or environmental changes or other aggravating or alleviating factors except as outlined above   No unusual exposure hx or h/o childhood pna/ asthma or knowledge of premature birth.  Current Allergies, Complete Past Medical History, Past Surgical History, Family History, and Social History were reviewed in [name] record.  ROS  The following are not active complaints unless bolded Hoarseness, sore throat, dysphagia, dental problems, itching, sneezing,  nasal congestion or discharge of excess mucus or purulent secretions, ear ache,   fever, chills, sweats, unintended wt loss or wt gain, classically pleuritic or exertional cp,  orthopnea pnd or arm/hand swelling  or leg swelling, presyncope, palpitations, abdominal pain, anorexia, nausea, vomiting, diarrhea  or change in bowel habits or change in bladder habits, change  in stools or change in urine, dysuria, hematuria,  rash, arthralgias, visual complaints, headache, numbness, weakness or ataxia or problems with walking or coordination,  change in mood or  memory.        Current Meds  Medication Sig   acetaminophen (TYLENOL) 325 MG tablet Take 650 mg by mouth every 6 (six) hours as needed.   azithromycin (ZITHROMAX) 250 MG tablet Take 1 tablet (250 mg total) by mouth as needed (for copd flare up).   bisoprolol (ZEBETA) 5 MG tablet Take 0.5 tablets (2.5 mg total) by mouth daily.   denosumab (PROLIA) 60 MG/ML SOSY injection Inject 60 mg into the skin every 6 (six) months.   irbesartan (AVAPRO) 75 MG tablet Take 1 tablet (75 mg total) by mouth  daily.   levalbuterol (XOPENEX HFA) 45 MCG/ACT inhaler Inhale up to 2 puffs every 4 hours as needed for shortness of breath or wheezing   Magnesium 250 MG TABS Take 250 mg by mouth daily.   mometasone-formoterol (DULERA) 200-5 MCG/ACT AERO INHALE 2 PUFFS BY MOUTH IN THE MORNING AND 2 PUFFS 12 HOURS LATER   Multiple Vitamin (MULTIVITAMIN) capsule Take 1 capsule by mouth daily.   omeprazole (PRILOSEC) 20 MG capsule Take 40 mg by mouth 2 (two) times daily before a meal.   OXYGEN 3 L at bedtime. Patient wear oxygen at night all the time and as needed during the day   predniSONE (DELTASONE) 10 MG tablet Take 1 tablet (10 mg total) by mouth as needed (for copd flare up).   Tiotropium Bromide Monohydrate (SPIRIVA RESPIMAT) 2.5 MCG/ACT AERS 2 pffs each am   Zinc 30 MG CAPS Take 1 capsule by mouth daily.                    Objective:   Physical Exam    02/04/2021       103   08/06/2020       105 12/11/2019       105  05/12/2019     113   01/09/2019      112     08/17/13 113 lb (51.256 kg)  07/18/13 114 lb 6.4 oz (51.891 kg)  06/21/13 112 lb (50.803 kg)     Vital signs reviewed  02/04/2021  - Note at rest 02 sats  93% on RA   General appearance:    amb thin wf nad    HEENT : pt wearing mask not removed for exam due to  covid - 19 concerns.   NECK :  without JVD/Nodes/TM/ nl carotid upstrokes bilaterally   LUNGS: no acc muscle use,  Min barrel  contour chest wall with bilateral  slightly decreased bs s audible wheeze and  without cough on insp or exp maneuvers and min  Hyperresonant  to  percussion bilaterally     CV:  RRR  no s3 or murmur or increase in P2, and no edema   ABD:  soft and nontender with pos end  insp Hoover's  in the supine position. No bruits or organomegaly appreciated, bowel sounds nl  MS:   Nl gait/  ext warm without deformities, calf tenderness, cyanosis or clubbing No obvious joint restrictions   SKIN: warm and dry without lesions    NEURO:  alert, approp, nl sensorium with  no motor or cerebellar deficits apparent.             Assessment & Plan:

## 2021-02-04 NOTE — Patient Instructions (Signed)
No change in maintenance medications    Please schedule a follow up visit in 12 months but call sooner if needed

## 2021-02-13 ENCOUNTER — Ambulatory Visit (INDEPENDENT_AMBULATORY_CARE_PROVIDER_SITE_OTHER): Payer: 59 | Admitting: Family Medicine

## 2021-02-13 ENCOUNTER — Other Ambulatory Visit: Payer: Self-pay

## 2021-02-13 DIAGNOSIS — Z713 Dietary counseling and surveillance: Secondary | ICD-10-CM

## 2021-02-13 NOTE — Progress Notes (Signed)
Telehealth Encounter (Text link [phone] ) PCP Gweneth Dimitri, MD I connected with Shanyla Marconi (MRN [id number]) on 02/13/2021 by MyChart video-enabled, HIPAA-compliant telemedicine application, verified that I was speaking with the correct person using two identifiers, and that the patient was in a private environment conducive to confidentiality.  The patient agreed to proceed.  Persons participating in visit were patient and provider (registered dietitian) Linna Darner, PhD, RD, LDN, CEDRD.  Provider was located at [facility] during this telehealth encounter; patient was at home.  Appt start time: 1400 end time: 1500 (1 hour)  Reason for telehealth visit: Referred by Gweneth Dimitri, MD for Medical Nutrition Therapy related to anorexia nervosa.  Also has diagnoses of osteoporosis, malnutrition, cardiomyopathy, CHF, GERD, dysphagia, severe TMJ, and COPD.  Relevant history/background: [name] has a h/o anorexia nervosa starting at ~age 89.  She has managed in recent years to expand previously narrow range of foods she will eat, although this is still very narrow.  She has swallowing and chewing problems that limit some foods.  In 2013 she was hospitalized with severe malnutrition, multiple organ failure, including CHF.  Following that hospitalization, at 87 lb and on a feeding tube, Ms. Maceachern managed to increase her intake (3 Ensures/day, 2 Ensure puddings, and multiple processed foods), and gain to 120 lb.  Her cardiologist, however, became concerned about her Na intake and fluid retention.  When she cut down on those foods, she lost down to ~99 lb.  She wants to gain weight while maintaining a heart-healthy diet.   Assessment: Royal Hawthorn feels her eating has been going "great,"  tolerating most new foods well.  Changes she has made include: Switched from coconut to cashew milk; added walnut milk, almond butter (w/out palm oil), 1/1/2 tsp avocado oil added to  smoothie; using 2 whole eggs and 1 white (vs all egg whites); started eating [place] [employer]); tried a Customer service manager apple in her smoothie, but had pain after, so has not tried again.  Has not found any Fe supplement that doesn't bother her stomach.  She's estimated she eats about 18 g of non-heme Fe/day.  Bowel function is good, and she is very happy that she has not become anxious about weight gain.   Still eating the same foods daily (see food recall).   Weight: 104.3 lb (self-report), an increase of 3 lb; ht is 64.5".  Calculates that she is consuming daily: 2217 kcal, 11.25 sat fat, 101.65 total dietary fat, 101.4 g protein, 1322 mg Na.   Usual eating pattern: 3 meals and 2 snacks per day. Frequent foods and beverages: water, home-prepared drinks (blender drinks: 1 c alm or coconut milk, 2 tbsp hemp pro pdr (15 g pro) , 1 tbsp flax meal, 1/2 c cooked lentils OR other blender drink: 1 c alm milk 1 c blueberry, 1/2 c raw broc, 2 tbsp alm butter.  Also eats baby food purees: swt potatoes, prunes,and consumes 3 X day: 1 c green beans with olive oil, 1/3 c blk-eyed peas, 1 egg white. Avoided foods: inflammatory foods (sugar, high-glycemic foods, dairy, egg yolks), flesh foods, soy milk/foods.   Recent physical activity: Walks around home nearly constantly each day.  Sits for only about 10 minutes at a time.  Hours of food prep time spent each day.   Sleep:  Sleep has increased to about 7 hours of sleep/night.  Sleeps in hospital bed (at 90-degree angle d/t CHF) b/c of damage from pressure wounds.  24-hr recall:  (Up at 6:30 AM) Drank (7:15 AM)-  1 c water B (8:30 AM)-  Smoothie: 1 c alm milk, 2 tbsp hemp pro pdr (15 g pro) , 1 tbsp flax meal, 1/2 c cooked lentils; 1 jar each baby food swt potatoes, prunes.   1 c water Snk ( AM)-   L (12 PM)-  1 c alm milk ,1 c blueberries, 1/2 c raw broc, 2 tbsp alm butter, 1.5 avocado oil; 1 c green beans with olive oil, 1/3 c blk-eyed peas, 1 egg, blueberry  Snk  (2 PM)-     Smoothie: 1 c cashew milk, 2 tbsp hemp pro pdr (15 g pro) , 1 tbsp flax meal, 1/2 c cooked lentils, 2 oz [place]. Snak (3 PM)-  1 c water Snk (4 PM)-  1 c green beans with olive oil, 1/3 c blk-eyed peas, 1 egg, Blueberry blender drink: 1 c alm milk ,1 c blueberries, 1/2 c raw broc, 2 tbsp alm butter. D (6 PM)-  1 c green beans with olive oil, 1/3 c blk-eyed peas, 1 egg white, 1 jar baby food prunes, water  Snk (8 PM)-  1 c walnut milk (120 kcal)  Snk (11 PM)-  1 c water Typical day? Yes.    Typically gets 40 oz water per day.    For recommendations and goals, see Patient Instructions.    Follow-up: prn.    Airika Alkhatib,JEANNIE

## 2021-02-13 NOTE — Patient Instructions (Signed)
Foods you still may want to try for added energy and nutrition:  - Grains such as barley, amaranth, or quinoa.  Start with no more than 1/2 cup cooked at a time.   - Fresh fruit (besides blueberries and pureed prunes), e.g., banana.   - Leafy greens as a source of iron (and for their anti-oxidants and anti-inflammatory phytochemicals).  Add 2 tbsp of almond butter to your 8 PM smoothie drink with walnut milk.    - Again, introduce only one new food at a time, and try it for 2 weeks before moving on to another new food.   - Document added foods and any noticeable response.     - Move less during the day:   Make a list of meaningful, productive, and/or fulfilling activities you can schedule for yourself.  Schedule at least one of these activities each day, and during the time of any of these activities, sit, stand, or lie only (no walking/moving).  Document how many minutes you are able to be still at a time.    Call [facility] [address] re. an iron source you might be able to tolerate: [phone].    Follow-up:  Call if you want another appointment [phone]).

## 2021-06-13 ENCOUNTER — Other Ambulatory Visit: Payer: Self-pay | Admitting: Internal Medicine

## 2021-08-07 ENCOUNTER — Telehealth: Payer: Self-pay | Admitting: Internal Medicine

## 2021-08-07 ENCOUNTER — Other Ambulatory Visit: Payer: Self-pay | Admitting: Internal Medicine

## 2021-08-07 MED ORDER — SPIRIVA RESPIMAT 2.5 MCG/ACT IN AERS: INHALATION_SPRAY | RESPIRATORY_TRACT | 11 refills | Status: DC

## 2021-08-07 NOTE — Addendum Note (Signed)
Addended by: Wyvonne Lenz on: 08/07/2021 11:46 AM   Modules accepted: Orders

## 2021-08-07 NOTE — Telephone Encounter (Signed)
Patient is aware that Spiriva was sent to [facility] pharmacy 08/06/21. She voiced her understanding and had nothing further questions.  Nothing further needed at this time.

## 2021-08-07 NOTE — Telephone Encounter (Signed)
Called pt's pharmacy and spoke with [place] checking to see if they received a refill of pt's Spiriva and she said that an Rx did not go through. While I was on the phone with her, I resent the Rx and she said that it did go through and they would notify pt once Rx is ready. Nothing further needed.

## 2021-08-07 NOTE — Telephone Encounter (Signed)
Lm for patient.  What is her preferred pharmacy? 

## 2021-08-21 ENCOUNTER — Other Ambulatory Visit: Payer: Self-pay | Admitting: *Deleted

## 2021-08-21 MED ORDER — SPIRIVA RESPIMAT 2.5 MCG/ACT IN AERS: INHALATION_SPRAY | RESPIRATORY_TRACT | 11 refills | Status: DC

## 2021-09-04 ENCOUNTER — Other Ambulatory Visit (HOSPITAL_COMMUNITY): Payer: Self-pay | Admitting: Cardiology

## 2021-09-04 DIAGNOSIS — R1900 Intra-abdominal and pelvic swelling, mass and lump, unspecified site: Secondary | ICD-10-CM

## 2021-09-04 DIAGNOSIS — R0602 Shortness of breath: Secondary | ICD-10-CM

## 2021-09-04 DIAGNOSIS — I5022 Chronic systolic (congestive) heart failure: Secondary | ICD-10-CM

## 2021-10-09 ENCOUNTER — Encounter (HOSPITAL_COMMUNITY): Payer: Self-pay | Admitting: Cardiology

## 2021-10-09 ENCOUNTER — Ambulatory Visit (HOSPITAL_COMMUNITY)
Admission: RE | Admit: 2021-10-09 | Discharge: 2021-10-09 | Disposition: A | Discharge status: Home / Self Care | Payer: 59 | Source: Ambulatory Visit | Attending: Cardiology | Admitting: Cardiology

## 2021-10-09 VITALS — BP 116/60 | HR 73

## 2021-10-09 DIAGNOSIS — I429 Cardiomyopathy, unspecified: Secondary | ICD-10-CM | POA: Diagnosis present

## 2021-10-09 DIAGNOSIS — R0600 Dyspnea, unspecified: Secondary | ICD-10-CM | POA: Diagnosis not present

## 2021-10-09 DIAGNOSIS — I5022 Chronic systolic (congestive) heart failure: Secondary | ICD-10-CM | POA: Diagnosis not present

## 2021-10-09 DIAGNOSIS — J449 Chronic obstructive pulmonary disease, unspecified: Secondary | ICD-10-CM | POA: Insufficient documentation

## 2021-10-09 DIAGNOSIS — R680 Hypothermia, not associated with low environmental temperature: Secondary | ICD-10-CM | POA: Diagnosis not present

## 2021-10-09 DIAGNOSIS — I959 Hypotension, unspecified: Secondary | ICD-10-CM | POA: Diagnosis not present

## 2021-10-09 LAB — LIPID PANEL
Cholesterol: 177 mg/dL (ref 0–200)
HDL: 56 mg/dL (ref >40)
LDL Cholesterol: 89 mg/dL (ref 0–99)
Total CHOL/HDL Ratio: 3.2 RATIO
Triglycerides: 159 mg/dL — ABNORMAL HIGH (ref <150)
VLDL: 32 mg/dL (ref 0–40)

## 2021-10-09 LAB — BASIC METABOLIC PANEL
Anion gap: 5 (ref 5–15)
BUN: 14 mg/dL (ref 6–20)
CO2: 27 mmol/L (ref 22–32)
Calcium: 9.6 mg/dL (ref 8.9–10.3)
Chloride: 105 mmol/L (ref 98–111)
Creatinine, Ser: 0.62 mg/dL (ref 0.44–1.00)
GFR, Estimated: >60 mL/min (ref >60)
Glucose, Bld: 101 mg/dL — ABNORMAL HIGH (ref 70–99)
Potassium: 4.2 mmol/L (ref 3.5–5.1)
Sodium: 137 mmol/L (ref 135–145)

## 2021-10-09 NOTE — Patient Instructions (Signed)
There has been no changes to you rmedications. ? ?Labs done today, your results will be available in MyChart, we will contact you for abnormal readings. ? ?Your physician has requested that you have an echocardiogram. Echocardiography is a painless test that uses sound waves to create images of your heart. It provides your doctor with information about the size and shape of your heart and how well your heart?s chambers and valves are working. This procedure takes approximately one hour. There are no restrictions for this procedure. ? ?Your physician recommends that you schedule a follow-up appointment in: 1 year (April 2024)  ** please call the office in February 2024 to arrange your follow up ** ? ?If you have any questions or concerns before your next appointment please send [place] a message through [place] or call our office at [phone].   ? ?TO LEAVE A MESSAGE FOR THE NURSE SELECT OPTION 2, PLEASE LEAVE A MESSAGE INCLUDING: ?YOUR NAME ?DATE OF BIRTH ?CALL BACK NUMBER ?REASON FOR CALL**this is important as we prioritize the call backs ? ?YOU WILL RECEIVE A CALL BACK THE SAME DAY AS LONG AS YOU CALL BEFORE 4:00 PM ? ?At the [facility], you and your health needs are our priority. As part of our continuing mission to provide you with exceptional heart care, we have created designated Provider Care Teams. These Care Teams include your primary Cardiologist (physician) and Advanced Practice Providers (APPs- Physician Assistants and Nurse Practitioners) who all work together to provide you with the care you need, when you need it.  ? ?You may see any of the following providers on your designated Care Team at your next follow up: ?Dr Arvilla Meres ?Dr Marca Ancona ?Tonye Becket, NP ?Robbie Lis, PA ?Jessica Milford,NP ?Anna Genre, PA ?Karle Plumber, PharmD ? ? ?Please be sure to bring in all your medications bottles to every appointment.  ? ? ?

## 2021-10-10 NOTE — Progress Notes (Signed)
Patient ID: [name], female   DOB: 20-Jul-1960, 61 y.o.   MRN: [id number] ?PCP: Dr. Corliss Blacker ?Cardiology: Dr. Shirlee Latch ? ?61 y.o. with complicated past medical history presents for evaluation of her cardiomyopathy.  She has a long history of neuromuscular disorder.  The exact diagnosis remains undetermined.  She has been confined to a wheelchair or a walker.  In 12/13, she was admitted to [facility] with hypotension, hypothermia, and altered mental status.   She was found to have profound anemia with hemoglobin down to 3.  She was transfused 4 units.  She also was found to have Enterococcal UTI and Serratia PNA.  She was intubated and eventually required tracheostomy.  During her hospitalization, she was found to have a cardiomyopathy with EF 20-25% by echo.  She was treated with milrinone.  Repeat echo in 5/14 showed EF improved to 50-55% with abnormal septal motion and last echo in 9/14 showed EF 55-60%.   ? ?Previously lasix was increased to 40 mg in am and 20 mg in pm along with 20 meq of potassium. I offered her a RHC however she declined. PFTs obtained as noted below and showed severe airway obstruction with subsequent evaluation by Dr [name].  At that time Dr [name] switched lisinopril to irbesartan. He felt that despite severity of her COPD, main limitation was her neuromuscular condition. Event monitor was worn in 1/15 due to tachypalpitations.  This showed PACs.  Event monitor was worn again in 12/17, this again showed PACs with short runs of atrial tachycardia.  ? ?Echo in 10/20 showed EF 60-65%, normal RV.  ? ?She returns for followup of CHF and palpitations.  She continues to have periodic exacerbations of COPD/asthma for which she is given steroids. Prior generalized weakness is improved, she is not using a walker now.  She continues to try to gain weight, difficult due to eating disorder. Mild dyspnea after walking about 1 block.   No chest pain.  No lightheadedness.   ? ?ECG (personally reviewed):  NSR, iRBBB ? ?Labs (1/14): K 3.9, creatinine 0.21, HCT 30 ?Labs (4/14): K 4, creatinine 0.4, BNP 19 ?Labs (10/14): K 3.5, creatinine 0.5 ?Labs (12/14): K 3.6, creatinine 0.4 ?Labs (07/28/12): K 3.5 creatinine 0.5 ?Labs (08/01/13): K 3.5 Creatinine 0.5 Pro BNP 129 ?Labs (2/16): K 3.8, creatinine 0.44 ?Labs (9/16): K 3.6, creatinine 0.52, BNP 14 ?Labs (11/17): K 4.6, creatinine 0.53, BNP 16 ?Labs (5/18): K 3.7, creatinine 0.53 ?Labs (1/19): K 3.7, creatinine 0.56 ?Labs (12/19): K 3.9, creatinine 0.57 ?Labs (9/20): BNP 21 ?Labs (4/21): K 4.1, creatinine 0.6 ?Labs (3/22): K 3.8, creatinine 0.61, LDL 108 ? ?PMH: ?1. Anorexia/cachexia/failure to thrive: Patient had a PEG tube.  ?2. Neuromuscular disease: exact diagnosis is still undetermined despite extensive neurology evaluation.  She does not have multiple sclerosis.  ?3. Fibromyalgia ?4. IBS ?5. TMJ syndrome ?6. PTSD ?7. GERD ?8. Sacral decubitus ulcer ?9. H/o aspiration ?10. 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  Echo (12/13) with EF 20-25% with diffuse hypokinesis, mild MR, moderate RV dilation and moderate RV dysfunction.  Echo (5/14) with EF 50-55%, abnormal septal motion.  Echo (9/14) with EF 55-60%.   ?- Echo (5/17): EF 60-65%.  ?- Echo (7/18): EF 60-65%, normal RV.  ?- Echo (10/20): EF 60-65%, normal RV ?11. Anemia ?12. COPD/asthma: PFTs 08/01/13 with FEV1 0.98 liters (33%), FVC 1.87 (50%), FEV 1/FVC 52%, FEF 25-75% 0.65 liter.  ?- She is on home oxygen.  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 Palpitations: Event monitor in 12/17 showed PACs and short runs of atrial tachycardia.  ?- 2/19 event monitor: She only wore it for a few days.  No significant arrhythmias.  ? ?SH: Lives with husband in [place].  Quit smoking > 20 years ago.  No ETOH.    ? ?FH: No history of cardiomyopathy.  Grandfather had MI at 20.  ? ?ROS: All systems reviewed and negative except as  per HPI.  ? ? ?Current Outpatient Medications  ?Medication Sig Dispense Refill  ? acetaminophen (TYLENOL) 325 MG tablet Take 975 mg by mouth in the morning.    ? azithromycin (ZITHROMAX) 250 MG tablet Take 1 tablet (250 mg total) by mouth as needed (for copd flare up). 6 each 11  ? bisoprolol (ZEBETA) 5 MG tablet TAKE 1/2 TABLET BY MOUTH DAILY. 15 tablet 11  ? irbesartan (AVAPRO) 75 MG tablet TAKE ONE TABLET BY MOUTH DAILY. 30 tablet 11  ? levalbuterol (XOPENEX HFA) 45 MCG/ACT inhaler Inhale up to 2 puffs every 4 hours as needed for shortness of breath or wheezing 1 each 5  ? Magnesium 250 MG TABS Take 250 mg by mouth daily.    ? mometasone-formoterol (DULERA) 200-5 MCG/ACT AERO INHALE 2 PUFFS BY MOUTH IN THE MORNING AND 2 PUFFS TWELVE HOURS LATER 13 g 11  ? Multiple Vitamin (MULTIVITAMIN) capsule Take 1 capsule by mouth daily.    ? omeprazole (PRILOSEC) 20 MG capsule Take 40 mg by mouth 2 (two) times daily before a meal.    ? OXYGEN 3 L at bedtime. Patient wear oxygen at night all the time and as needed during the day    ? predniSONE (DELTASONE) 10 MG tablet Take 1 tablet (10 mg total) by mouth as needed (for copd flare up). 100 tablet 2  ? Tiotropium Bromide Monohydrate (SPIRIVA RESPIMAT) 2.5 MCG/ACT AERS INHALE 2 SPRAY(S) BY MOUTH IN THE MORNING 4 g 11  ? Zinc 30 MG CAPS Take 1 capsule by mouth daily.     ? ?No current facility-administered medications for this encounter.  ? ? ?BP 116/60   Pulse 73   SpO2 97%  ?General: NAD, thin ?Neck: No JVD, no thyromegaly or thyroid nodule.  ?Lungs: Clear to auscultation bilaterally with normal respiratory effort. ?CV: Nondisplaced PMI.  Heart regular S1/S2, no S3/S4, no murmur.  No peripheral edema.  No carotid bruit.  Normal pedal pulses.  ?Abdomen: Soft, nontender, no hepatosplenomegaly, no distention.  ?Skin: Intact without lesions or rashes.  ?Neurologic: Alert and oriented x 3.  ?Psych: Normal affect. ?Extremities: No clubbing or cyanosis.  ?HEENT: Normal.   ? ?Assessment/Plan: ?1. Cardiomyopathy: Last echo in 10/20 was normal.  I suspect that her prior low EF was due to a stress (Takotsubo-type) cardiomyopathy.  She is not volume overloaded on exam.  She has chronic NYHA class II symptoms, but think this may be due more to COPD/asthma. ?- She can continue current bisoprolol and irbesartan.     ?- BMET today.   ?- Will arrange for repeat echo given ongoing fatigue/mild dyspnea to make sure that LV/RV function remain normal.  ?2. Dyspnea: NYHA class II symptoms, chronic but improved over time. Suspect combination of COPD/asthma and deconditioning due to ?neuromuscular disorder, eating disorder.  As above, doubt active CHF.  She is followed closely by Dr [name]. She is wearing oxygen prn and at night.  She has periodic flares where she will take prednisone.  ?3. Neuromuscular  disorder: Not fully characterized.  She used to be in a wheelchair, then a walker, now off the walker.  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  ? ?Followup in 1 year if echo is normal.  ?  ?Fransico Meadow ?10/10/2021 ? ? ? ? ?  ? ?

## 2021-10-27 ENCOUNTER — Ambulatory Visit (HOSPITAL_COMMUNITY): Payer: 59

## 2021-11-25 ENCOUNTER — Ambulatory Visit (HOSPITAL_COMMUNITY)
Admission: RE | Admit: 2021-11-25 | Discharge: 2021-11-25 | Disposition: A | Discharge status: Home / Self Care | Payer: 59 | Source: Ambulatory Visit | Attending: Cardiology | Admitting: Cardiology

## 2021-11-25 DIAGNOSIS — I5022 Chronic systolic (congestive) heart failure: Secondary | ICD-10-CM | POA: Diagnosis present

## 2021-11-25 DIAGNOSIS — J449 Chronic obstructive pulmonary disease, unspecified: Secondary | ICD-10-CM | POA: Insufficient documentation

## 2021-11-25 LAB — ECHOCARDIOGRAM COMPLETE
AR max vel: 2.82 cm2
AV Peak grad: 4.4 mmHg
Ao pk vel: 1.05 m/s
Area-P 1/2: 4.15 cm2
Calc EF: 63.4 %
S' Lateral: 2.7 cm
Single Plane A2C EF: 65.7 %
Single Plane A4C EF: 63.8 %

## 2022-02-10 ENCOUNTER — Ambulatory Visit: Payer: 59 | Admitting: Internal Medicine

## 2022-02-13 ENCOUNTER — Encounter: Payer: Self-pay | Admitting: Internal Medicine

## 2022-02-13 ENCOUNTER — Other Ambulatory Visit: Payer: Self-pay | Admitting: Internal Medicine

## 2022-02-13 ENCOUNTER — Ambulatory Visit (INDEPENDENT_AMBULATORY_CARE_PROVIDER_SITE_OTHER): Payer: 59 | Admitting: Internal Medicine

## 2022-02-13 DIAGNOSIS — J449 Chronic obstructive pulmonary disease, unspecified: Secondary | ICD-10-CM | POA: Diagnosis not present

## 2022-02-13 MED ORDER — SPIRIVA RESPIMAT 2.5 MCG/ACT IN AERS: INHALATION_SPRAY | RESPIRATORY_TRACT | 11 refills | Status: DC

## 2022-02-13 MED ORDER — AZITHROMYCIN 250 MG PO TABS: 250.0000 mg | ORAL_TABLET | ORAL | 11 refills | Status: DC | PRN

## 2022-02-13 MED ORDER — DULERA 200-5 MCG/ACT IN AERO: INHALATION_SPRAY | RESPIRATORY_TRACT | 11 refills | Status: DC

## 2022-02-13 MED ORDER — SPIRIVA RESPIMAT 2.5 MCG/ACT IN AERS: 2.0000 | INHALATION_SPRAY | Freq: Every day | RESPIRATORY_TRACT | 0 refills | Status: DC

## 2022-02-13 MED ORDER — PREDNISONE 10 MG PO TABS: 10.0000 mg | ORAL_TABLET | ORAL | 2 refills | Status: DC | PRN

## 2022-02-13 MED ORDER — LEVALBUTEROL TARTRATE 45 MCG/ACT IN AERO
INHALATION_SPRAY | RESPIRATORY_TRACT | 1 refills | Status: DC
Start: 2022-02-13 — End: 2023-03-09

## 2022-02-13 NOTE — Assessment & Plan Note (Signed)
Quit smoking 1989 - see f/v loops 08/01/13 c/w upper airway obstruction > refused to repeat off acei  - trial off acei 08/18/2013 > marked improvement - 09/15/2013   Walked RA x about 50 ft and stopped due to  Fatigue s sob or desat - Spiriva trial 09/15/2013 > improved 10/27/13 so continue - added flutter valve 09/05/2014  - 10/25/2014  added dulera 100 2bid based on reported improvement with prednisone> much better - requiring prn pred since 05/2015 s documentation it helps (refuses pfts)  - V/q 04/18/2018   wnl ( not typical of copd and neg for PE)  . 09/07/2018  After extensive coaching inhaler device,  effectiveness =    90%   Apparently  Group D (now reclassified as E) in terms of symptom/risk and laba/lama/ICS  therefore appropriate rx at this point >>>  Continue spiriva / dulera and prn xopenex with zpak/ pred for flares should they occur between yearly rechecks          Each maintenance medication was reviewed in detail including emphasizing most importantly the difference between maintenance and prns and under what circumstances the prns are to be triggered using an action plan format where appropriate.  Total time for H and P, chart review, counseling, reviewing hfa/smi/ device(s) and generating customized AVS unique to this office visit / same day charting = 24 min

## 2022-02-13 NOTE — Progress Notes (Signed)
Subjective:   Patient ID: [name], female    DOB: 03-14-1961   MRN: [id number]    Brief patient profile:  60 yowf quit smoking 1989 then ? MS dx'd early 60's by Dohmeir (though pt states flatly she doesn't have MS)  and "it's been steadily downhill since" mostly muscle related but since 2012 also with breathing difficulty > admit:   Admit date: 06/06/2012  Discharge date: 06/20/2012  Discharge Diagnoses:  Acute respiratory failure  CAP (community acquired pneumonia) vs aspiration pneumonia  Acute encephalopathy, resolved  Neuromuscular disorder: not fully defined.  Anemia  Thrombocytopenia  Coagulopathy  Acute liver failure  Enterococcal UTI (pansensitive)  Shock circulatory, suspect primarily cardiogenic  Sacral Pressure Ulcer  Dilated cardiomyopathy  Malnutrition compromising bodily function  H/O anorexia nervosa  Dysphagia    History of Present Illness  08/17/2013 1st [facility] office visit/ [name] was able do some adl/s with walker and no 02 summer of 2014  then downhill since  And now sob at rest  X 2 weeks and able to lie down at 45 degrees and can't tol lying flat at all for sev weeks assoc with dry cough day and night and dysphagia/ choking sensation on acei.  Already on gerd rx/ inhaler no benefit.  rec Stop lisinopril Avapro 75 mg one daily  Prilosec 20 mg Take 30- 60 min before your first and last meals of the day  GERD diet        09/05/2014 acute ov/Valgene Deloatch re: worse sob/increase need for saba on spiriva maint rx  Chief Complaint  Patient presents with   Acute Visit    Pt c/o increased SOB- started mid Nov 2015, worse for the past month. She is using xopenex 2-3 x per day on average. She c/o cough for the past month- sometimes prod with clear yellow/green sputum. She has had some occ bloody nasal d/c.  Has not been able to lie down for several years, sitting upright in hosp bed due to sob Swallowing worse "because cough is worse" despite  on  prilosec 20 bid with meals  Denies choking but all foods are pureed. rec Change omeprazole to where you take 20mg  x 2 Take 30- 60 min before your first and last meals of the day  GERD   Try flutter valve as needed for cough or congestion > ok to cough into the valve if helps    06/04/2015  f/u ov/Normal Recinos re:  Probable copd on spiriva and dulera 100 2bid Chief Complaint  Patient presents with   Follow-up    Pt c/o increased SOB for the 3 wks. She states that she coughs more when she talks and produces clear sputum.  She has been using her o2 more, and xopenex 1-2 x per wk.   Uses 02 daytime prn  Never uses at hs  Not seeing neuro any more / "the only reason my muscles get worse is that my breathing gets worse" swallowing no change. Prednisone really helps breathing  rec Increase dulera to 200 Take 2 puffs first thing in am and then another 2 puffs about 12 hours later.  02 2lpm at bedtime and during the day goal is  to keep above 90% so ok to adjust up down or off to achieve that goal Prednisone 10 mg take  4 each am x 2 days,   2 each am x 2 days,  1 each am x 2 days and stop     12/11/2019  f/u  ov/Maleko Greulich re: copd/ unable to do pfts  Chief Complaint  Patient presents with   Follow-up    6 month f/u up for COPD. States her breathing has been stable since last visit.   Dyspnea:  Able to walk around the house now on 3lpm  Cough: no prednisone / zpak  Sleeping: 90 degrees o/w coughing SABA use: avg once a day  02: 3lpm 24/7 despite not qualifying for 02  rec No change in your medications or 02    08/06/2020  f/u ov/Adea Geisel re: ? Gold 0/ unable to do pfts maint on spiriva/ dulera 200 / has prn prednisone but not using  Chief Complaint  Patient presents with   Follow-up    Patient is doing much better since last visit. States that she mostly only wearing oxygen at night and as needed during the day.   Dyspnea: walks hallways now where used to be in w/c  Attributes this to the right diet   Cough: minimal  Sleeping: 90 degrees hosp bed  SABA use: not needing  02: 3lpm hs and prn daytime with sats > 90% RA though not checking at peak ex  Rec Make sure you check your oxygen saturation  at your highest level of activity  to be sure it stays over 90% and adjust  02 flow upward to maintain this level if needed but remember to turn it back to previous settings when you stop (to conserve your supply).  No change in medications Zostrix cream is over the counter - start with small bedtime dose > did not use it but improved           02/13/2022  f/u ov/Dirck Butch re: GOLD 0 PFTs  maint on spiriva/dulera no need for zmax / pred this past year  Chief Complaint  Patient presents with   Follow-up    Follow up. Patient has no complaints.   Dyspnea:  able to do some walking in stores, still  hc parking/ mailbox and back ok now maybe 150 ft round trip   Cough: still some with swallowing but better vs one year prior to OV   Sleeping: at 90 degrees still, her preference  SABA use: very rare 02: 3lpm hs / rarely during the day      No obvious day to day or daytime variability or assoc excess/ purulent sputum or mucus plugs or hemoptysis or cp or chest tightness, subjective wheeze or overt sinus or hb symptoms.   Sleeping as above  without nocturnal  or early am exacerbation  of respiratory  c/o's or need for noct saba. Also denies any obvious fluctuation of symptoms with weather or environmental changes or other aggravating or alleviating factors except as outlined above   No unusual exposure hx or h/o childhood pna/ asthma or knowledge of premature birth.  Current Allergies, Complete Past Medical History, Past Surgical History, Family History, and Social History were reviewed in [employer] record.  ROS  The following are not active complaints unless bolded Hoarseness, sore throat, dysphagia, dental problems, itching, sneezing,  nasal congestion or discharge of excess  mucus or purulent secretions, ear ache,   fever, chills, sweats, unintended wt loss or wt gain, classically pleuritic or exertional cp,  orthopnea pnd or arm/hand swelling  or leg swelling, presyncope, palpitations, abdominal pain, anorexia, nausea, vomiting, diarrhea  or change in bowel habits or change in bladder habits, change in stools or change in urine, dysuria, hematuria,  rash, arthralgias, visual complaints, headache,  numbness, weakness or ataxia or problems with walking or coordination,  change in mood or  memory.        Current Meds  Medication Sig   acetaminophen (TYLENOL) 325 MG tablet Take 975 mg by mouth in the morning.   azithromycin (ZITHROMAX) 250 MG tablet Take 1 tablet (250 mg total) by mouth as needed (for copd flare up).   bisoprolol (ZEBETA) 5 MG tablet TAKE 1/2 TABLET BY MOUTH DAILY.   irbesartan (AVAPRO) 75 MG tablet TAKE ONE TABLET BY MOUTH DAILY.   levalbuterol (XOPENEX HFA) 45 MCG/ACT inhaler Inhale up to 2 puffs every 4 hours as needed for shortness of breath or wheezing.   Magnesium 250 MG TABS Take 250 mg by mouth daily.   mometasone-formoterol (DULERA) 200-5 MCG/ACT AERO INHALE 2 PUFFS BY MOUTH IN THE MORNING AND 2 PUFFS TWELVE HOURS LATER   Multiple Vitamin (MULTIVITAMIN) capsule Take 1 capsule by mouth daily.   omeprazole (PRILOSEC) 20 MG capsule Take 40 mg by mouth 2 (two) times daily before a meal.   OXYGEN 3 L at bedtime. Patient wear oxygen at night all the time and as needed during the day   predniSONE (DELTASONE) 10 MG tablet Take 1 tablet (10 mg total) by mouth as needed (for copd flare up).   Tiotropium Bromide Monohydrate (SPIRIVA RESPIMAT) 2.5 MCG/ACT AERS INHALE 2 SPRAY(S) BY MOUTH IN THE MORNING   Zinc 30 MG CAPS Take 1 capsule by mouth daily.                      Objective:   Physical Exam  wts   02/13/2022     102  02/04/2021       103   08/06/2020       105 12/11/2019       105  05/12/2019     113   01/09/2019      112     08/17/13 113 lb  (51.256 kg)  07/18/13 114 lb 6.4 oz (51.891 kg)  06/21/13 112 lb (50.803 kg)     Vital signs reviewed  02/13/2022  - Note at rest 02 sats  100% on RA   General appearance:    thin amb chronically ill appearing wf nad     HEENT : Oropharynx  clear  Nasal turbinates nl    NECK :  without  apparent JVD/ palpable Nodes/TM    LUNGS: no acc muscle use,  Min barrel  contour chest wall with bilateral  slightly decreased bs s audible wheeze and  without cough on insp or exp maneuvers and min  Hyperresonant  to  percussion bilaterally    CV:  RRR  no s3 or murmur or increase in P2, and no edema   ABD:  soft and nontender with pos end  insp Hoover's  in the supine position.  No bruits or organomegaly appreciated   MS:  Nl gait/ ext warm without deformities Or obvious joint restrictions  calf tenderness, cyanosis or clubbing     SKIN: warm and dry without lesions    NEURO:  alert, approp, nl sensorium with  no motor or cerebellar deficits apparent.              Assessment & Plan:

## 2022-02-13 NOTE — Addendum Note (Signed)
Addended by: Arlyss Repress on: 02/13/2022 04:31 PM   Modules accepted: Orders

## 2022-02-13 NOTE — Patient Instructions (Signed)
No change in medications   Please schedule a follow up visit in 12  months but call sooner if needed  

## 2022-06-25 ENCOUNTER — Other Ambulatory Visit: Payer: Self-pay | Admitting: Internal Medicine

## 2022-08-24 ENCOUNTER — Other Ambulatory Visit (HOSPITAL_COMMUNITY): Payer: Self-pay | Admitting: Cardiology

## 2022-08-24 DIAGNOSIS — R0602 Shortness of breath: Secondary | ICD-10-CM

## 2022-08-24 DIAGNOSIS — R1900 Intra-abdominal and pelvic swelling, mass and lump, unspecified site: Secondary | ICD-10-CM

## 2022-08-24 DIAGNOSIS — I5022 Chronic systolic (congestive) heart failure: Secondary | ICD-10-CM

## 2022-09-24 ENCOUNTER — Other Ambulatory Visit (HOSPITAL_COMMUNITY): Payer: Self-pay

## 2022-09-24 ENCOUNTER — Telehealth (HOSPITAL_COMMUNITY): Payer: Self-pay

## 2022-09-24 DIAGNOSIS — I872 Venous insufficiency (chronic) (peripheral): Secondary | ICD-10-CM

## 2022-09-24 DIAGNOSIS — I5022 Chronic systolic (congestive) heart failure: Secondary | ICD-10-CM

## 2022-09-24 NOTE — Telephone Encounter (Signed)
I spoke to patient. She is going to call her PCP to see if she can get in. If not, she is planning to go to [facility] in [place] to make sure not a DVT. Referral placed for [facility].

## 2022-09-24 NOTE — Telephone Encounter (Signed)
Can she get a work-in w/ her PCP to check to make sure not DVT or cellulitis?   If she has not already been seen by [facility], can place referral to their office.

## 2022-09-24 NOTE — Addendum Note (Signed)
Addended by: Asencion Noble on: 09/24/2022 03:21 PM   Modules accepted: Orders

## 2022-09-24 NOTE — Telephone Encounter (Signed)
Patient called complaining of worsening pain in her legs along with swelling. She states Dr. Aundra Dubin indicated she had venous insufficiency previously and she is concerned it is worsening. She states there is some redness and warmth in her calf area and her feet. When she elevates her legs it improves, but when she gets up in the morning it is "fire engine red" for the past month. Over the past week, has gotten a lot worse - the left leg is worse with swelling.

## 2022-09-25 ENCOUNTER — Other Ambulatory Visit (HOSPITAL_COMMUNITY): Payer: Self-pay | Admitting: Family Medicine

## 2022-09-25 ENCOUNTER — Ambulatory Visit (HOSPITAL_COMMUNITY)
Admission: RE | Admit: 2022-09-25 | Discharge: 2022-09-25 | Disposition: A | Discharge status: Home / Self Care | Payer: 59 | Source: Ambulatory Visit | Attending: Vascular Surgery | Admitting: Vascular Surgery

## 2022-09-25 DIAGNOSIS — M7989 Other specified soft tissue disorders: Secondary | ICD-10-CM | POA: Insufficient documentation

## 2022-10-13 ENCOUNTER — Other Ambulatory Visit: Payer: Self-pay | Admitting: *Deleted

## 2022-10-13 DIAGNOSIS — M79606 Pain in leg, unspecified: Secondary | ICD-10-CM

## 2022-10-16 ENCOUNTER — Ambulatory Visit (HOSPITAL_COMMUNITY)
Admission: RE | Admit: 2022-10-16 | Discharge: 2022-10-16 | Disposition: A | Discharge status: Home / Self Care | Payer: 59 | Source: Ambulatory Visit | Attending: Vascular Surgery | Admitting: Vascular Surgery

## 2022-10-16 DIAGNOSIS — M79605 Pain in left leg: Secondary | ICD-10-CM | POA: Diagnosis not present

## 2022-10-16 DIAGNOSIS — M79606 Pain in leg, unspecified: Secondary | ICD-10-CM | POA: Diagnosis present

## 2022-10-20 ENCOUNTER — Ambulatory Visit (INDEPENDENT_AMBULATORY_CARE_PROVIDER_SITE_OTHER): Payer: 59 | Admitting: Vascular Surgery

## 2022-10-20 ENCOUNTER — Encounter: Payer: Self-pay | Admitting: Vascular Surgery

## 2022-10-20 VITALS — BP 102/55 | HR 77 | Temp 98.2°F | Resp 20 | Ht 64.0 in | Wt 103.7 lb

## 2022-10-20 DIAGNOSIS — I872 Venous insufficiency (chronic) (peripheral): Secondary | ICD-10-CM

## 2022-10-20 NOTE — Progress Notes (Unsigned)
[facility] [place]  ASSESSMENT / PLAN: [name] is a 62 y.o. female with chronic venous insufficiency of bilateral lower extremity causing swelling (C2 disease).  Venous duplex shows no reflux.  Recommend compression and elevation for symptomatic relief. Follow up with me as needed.  CHIEF COMPLAINT: swelling in legs  HISTORY OF PRESENT ILLNESS: [name] is a 62 y.o. female referred to clinic for evaluation of bilateral lower extremity swelling.  Patient has had multiple health challenges over her life.  She is struggling with a unnamed neuromuscular disorder, COPD, heart failure.  Thankfully she has improved significantly over the past year.  She was previously nonambulatory and now is able to walk without assistance.  She was previously oxygen dependent and now is not.  She notices swelling in her bilateral lower extremities which is bothersome to her.  She has scattered reticular veins across her feet and ankles bilaterally.  I reviewed her venous duplex results with her today.   Past Medical History:  Diagnosis Date   Acute liver failure    Anemia    Anorexia nervosa    h/o    CAP (community acquired pneumonia)    Cardiomyopathy    CHF (congestive heart failure)    Difficult intubation    secondary to jaw muscles   Fibromyalgia    Fibromyalgia syndrome    GERD (gastroesophageal reflux disease)    IBS (irritable bowel syndrome)    MS (multiple sclerosis)    PTSD (post-traumatic stress disorder)    Sacral decubitus ulcer    Shock circulatory    TMJ disease     Past Surgical History:  Procedure Laterality Date   BUNIONECTOMY     CESAREAN SECTION     DILATION AND CURETTAGE OF UTERUS  2007   TRACHEOSTOMY  06/2012   TUBAL LIGATION      Family History  Problem Relation Age of Onset   Asthma Mother    Uterine cancer Mother    Skin cancer Mother    Asthma Father    Cancer Father        ? type   Asthma Brother     Asthma Sister    Heart disease Maternal Uncle    Heart disease Maternal Grandfather    Thyroid cancer Brother    Multiple sclerosis Brother    Multiple sclerosis Other    Breast cancer Maternal Aunt     Social History   Socioeconomic History   Marital status: Married    Spouse name: Not on file   Number of children: Not on file   Years of education: Not on file   Highest education level: Not on file  Occupational History   Not on file  Tobacco Use   Smoking status: Former    Packs/day: 1.00    Years: 11.00    Additional pack years: 0.00    Total pack years: 11.00    Types: Cigarettes    Start date: 69    Quit date: 07/07/1987    Years since quitting: 35.3   Smokeless tobacco: Never  Vaping Use   Vaping Use: Never used  Substance and Sexual Activity   Alcohol use: No   Drug use: No   Sexual activity: Never  Other Topics Concern   Not on file  Social History Narrative   Not on file   Social Determinants of Health   Financial Resource Strain: Not on file  Food Insecurity: Not on file  Transportation Needs:  Not on file  Physical Activity: Not on file  Stress: Not on file  Social Connections: Not on file  Intimate Partner Violence: Not on file    Allergies  Allergen Reactions   Scopolamine Anaphylaxis and Shortness Of Breath    Respiratory distress   Duloxetine Hcl Other (See Comments)   Ivp Dye [Iodinated Contrast Media] Other (See Comments)    unknown   Lisinopril     SOB, cough   Other Other (See Comments)   Carvedilol Rash    Current Outpatient Medications  Medication Sig Dispense Refill   acetaminophen (TYLENOL) 325 MG tablet Take 975 mg by mouth in the morning.     azithromycin (ZITHROMAX) 250 MG tablet Take 1 tablet (250 mg total) by mouth as needed (for copd flare up). 6 each 11   bisoprolol (ZEBETA) 5 MG tablet TAKE 1/2 TABLET BY MOUTH DAILY 15 tablet 11   irbesartan (AVAPRO) 75 MG tablet TAKE ONE TABLET BY MOUTH ONCE DAILY 30 tablet 11    levalbuterol (XOPENEX HFA) 45 MCG/ACT inhaler Inhale up to 2 puffs every 4 hours as needed for shortness of breath or wheezing. 15 g 1   Magnesium 250 MG TABS Take 250 mg by mouth daily.     mometasone-formoterol (DULERA) 200-5 MCG/ACT AERO INHALE 2 PUFFS IN THE MORNING AND 2 PUFFS TWELVE HOURS LATER 13 g 0   Multiple Vitamin (MULTIVITAMIN) capsule Take 1 capsule by mouth daily.     omeprazole (PRILOSEC) 20 MG capsule Take 40 mg by mouth 2 (two) times daily before a meal.     OXYGEN 3 L at bedtime. Patient wear oxygen at night all the time and as needed during the day     predniSONE (DELTASONE) 10 MG tablet Take 1 tablet (10 mg total) by mouth as needed (for copd flare up). 100 tablet 2   Tiotropium Bromide Monohydrate (SPIRIVA RESPIMAT) 2.5 MCG/ACT AERS INHALE 2 SPRAY(S) BY MOUTH IN THE MORNING 4 g 11   No current facility-administered medications for this visit.    PHYSICAL EXAM Vitals:   10/20/22 1456  BP: (!) 102/55  Pulse: 77  Resp: 20  Temp: 98.2 F (36.8 C)  SpO2: 99%  Weight: 103 lb 11.2 oz (47 kg)  Height: 5\' 4"  (1.626 m)   Chronically ill-appearing woman Regular rate and rhythm Unlabored breathing 2+ pedal pulses Reticular veins across the feet and ankles bilaterally Trace edema bilaterally  PERTINENT LABORATORY AND RADIOLOGIC DATA  Most recent CBC    Latest Ref Rng & Units 03/30/2018    2:40 PM 03/21/2013   12:23 PM 07/12/2012   10:04 AM  CBC  WBC 4.0 - 10.5 K/uL 7.2  4.2  7.8   Hemoglobin 12.0 - 15.0 g/dL 29.5  18.8  9.4   Hematocrit 36.0 - 46.0 % 35.6  39.6  30.0   Platelets 150.0 - 400.0 K/uL 273.0  192.0  402      Most recent CMP    Latest Ref Rng & Units 10/09/2021    2:25 PM 09/06/2020    2:53 PM 11/03/2019    2:42 PM  CMP  Glucose 70 - 99 mg/dL 416  95  606   BUN 6 - 20 mg/dL 14  13  20    Creatinine 0.44 - 1.00 mg/dL 3.01  6.01  0.93   Sodium 135 - 145 mmol/L 137  136  137   Potassium 3.5 - 5.1 mmol/L 4.2  3.8  4.1   Chloride 98 - 111  mmol/L 105  101   101   CO2 22 - 32 mmol/L Calcium 8.9 - 10.3 mg/dL 9.6  9.7  9.6     Renal function CrCl cannot be calculated (Patient's most recent lab result is older than the maximum 21 days allowed.).  No results found for: "HGBA1C"  LDL Cholesterol  Date Value Ref Range Status  10/09/2021 89 0 - 99 mg/dL Final    Comment:           Total Cholesterol/HDL:CHD Risk Coronary Heart Disease Risk Table                     Men   Women  1/2 Average Risk   3.4   3.3  Average Risk       5.0   4.4  2 X Average Risk   9.6   7.1  3 X Average Risk  23.4   11.0        Use the calculated Patient Ratio above and the CHD Risk Table to determine the patient's CHD Risk.        ATP III CLASSIFICATION (LDL):  <100     mg/dL   Optimal  621-308  mg/dL   Near or Above                    Optimal  130-159  mg/dL   Borderline  657-846  mg/dL   High  >962     mg/dL   Very High Performed at [facility], [address] [place], [place] [address]      Vascular Imaging: Venous duplex shows no evidence of DVT or venous reflux.  Rande Brunt. Lenell Antu, MD [facility] Phone Number: [phone] 10/20/2022 5:33 PM   Total time spent on preparing this encounter including chart review, data review, collecting history, examining the patient, coordinating care for this new patient, 45 minutes.  Portions of this report may have been transcribed using voice recognition software.  Every effort has been made to ensure accuracy; however, inadvertent computerized transcription errors may still be present.

## 2022-10-21 ENCOUNTER — Other Ambulatory Visit: Payer: Self-pay | Admitting: Family Medicine

## 2022-10-21 DIAGNOSIS — M810 Age-related osteoporosis without current pathological fracture: Secondary | ICD-10-CM

## 2022-10-22 ENCOUNTER — Other Ambulatory Visit: Payer: Self-pay | Admitting: Family Medicine

## 2022-10-22 DIAGNOSIS — Z1231 Encounter for screening mammogram for malignant neoplasm of breast: Secondary | ICD-10-CM

## 2022-10-28 ENCOUNTER — Ambulatory Visit
Admission: RE | Admit: 2022-10-28 | Discharge: 2022-10-28 | Disposition: A | Discharge status: Home / Self Care | Payer: 59 | Source: Ambulatory Visit | Attending: Family Medicine | Admitting: Family Medicine

## 2022-10-28 DIAGNOSIS — Z1231 Encounter for screening mammogram for malignant neoplasm of breast: Secondary | ICD-10-CM

## 2022-11-02 ENCOUNTER — Encounter (HOSPITAL_COMMUNITY): Payer: Self-pay | Admitting: Cardiology

## 2022-11-02 ENCOUNTER — Ambulatory Visit (HOSPITAL_COMMUNITY)
Admission: RE | Admit: 2022-11-02 | Discharge: 2022-11-02 | Disposition: A | Discharge status: Home / Self Care | Payer: 59 | Source: Ambulatory Visit | Attending: Cardiology | Admitting: Cardiology

## 2022-11-02 VITALS — BP 100/50 | HR 69 | Wt 103.8 lb

## 2022-11-02 DIAGNOSIS — G709 Myoneural disorder, unspecified: Secondary | ICD-10-CM | POA: Diagnosis not present

## 2022-11-02 DIAGNOSIS — Z79899 Other long term (current) drug therapy: Secondary | ICD-10-CM | POA: Insufficient documentation

## 2022-11-02 DIAGNOSIS — Z87891 Personal history of nicotine dependence: Secondary | ICD-10-CM | POA: Diagnosis not present

## 2022-11-02 DIAGNOSIS — I4510 Unspecified right bundle-branch block: Secondary | ICD-10-CM | POA: Insufficient documentation

## 2022-11-02 DIAGNOSIS — R0600 Dyspnea, unspecified: Secondary | ICD-10-CM | POA: Insufficient documentation

## 2022-11-02 DIAGNOSIS — J4489 Other specified chronic obstructive pulmonary disease: Secondary | ICD-10-CM | POA: Insufficient documentation

## 2022-11-02 DIAGNOSIS — I5022 Chronic systolic (congestive) heart failure: Secondary | ICD-10-CM | POA: Insufficient documentation

## 2022-11-02 DIAGNOSIS — I429 Cardiomyopathy, unspecified: Secondary | ICD-10-CM | POA: Insufficient documentation

## 2022-11-02 LAB — BASIC METABOLIC PANEL
Anion gap: 9 (ref 5–15)
BUN: 19 mg/dL (ref 6–20)
CO2: 27 mmol/L (ref 22–32)
Calcium: 9.7 mg/dL (ref 8.9–10.3)
Chloride: 101 mmol/L (ref 98–111)
Creatinine, Ser: 0.51 mg/dL (ref 0.44–1.00)
GFR, Estimated: >60 mL/min (ref >60)
Glucose, Bld: 97 mg/dL (ref 70–99)
Potassium: 3.9 mmol/L (ref 3.5–5.1)
Sodium: 137 mmol/L (ref 135–145)

## 2022-11-02 LAB — LIPID PANEL
Cholesterol: 172 mg/dL (ref 0–200)
HDL: 64 mg/dL (ref >40)
LDL Cholesterol: 87 mg/dL (ref 0–99)
Total CHOL/HDL Ratio: 2.7 RATIO
Triglycerides: 103 mg/dL (ref <150)
VLDL: 21 mg/dL (ref 0–40)

## 2022-11-02 NOTE — Patient Instructions (Addendum)
There has been no changes to your medications.  Labs done today, your results will be available in MyChart, we will contact you for abnormal readings.  Your physician recommends that you schedule a follow-up appointment in: 1 year ( April 2025 ) ** please call the office in February 2025 to arrange your follow up appointment. **  If you have any questions or concerns before your next appointment please send us a message through mychart or call our office at [phone].    TO LEAVE A MESSAGE FOR THE NURSE SELECT OPTION 2, PLEASE LEAVE A MESSAGE INCLUDING: YOUR NAME DATE OF BIRTH CALL BACK NUMBER REASON FOR CALL**this is important as we prioritize the call backs  YOU WILL RECEIVE A CALL BACK THE SAME DAY AS LONG AS YOU CALL BEFORE 4:00 PM  At the [facility], you and your health needs are our priority. As part of our continuing mission to provide you with exceptional heart care, we have created designated Provider Care Teams. These Care Teams include your primary Cardiologist (physician) and Advanced Practice Providers (APPs- Physician Assistants and Nurse Practitioners) who all work together to provide you with the care you need, when you need it.   You may see any of the following providers on your designated Care Team at your next follow up: Dr Daniel Bensimhon Dr Dalton McLean Dr. Aditya Sabharwal Amy Clegg, NP Brittainy Simmons, PA Jessica Milford,NP Lindsay Finch, PA Alma Diaz, NP Lauren Kemp, PharmD   Please be sure to bring in all your medications bottles to every appointment.    Thank you for choosing [facility]    

## 2022-11-03 NOTE — Progress Notes (Signed)
Patient ID: [name], female   DOB: 08-26-1960, 62 y.o.   MRN: [id number] PCP: Dr. Corliss Blacker Cardiology: Dr. Shirlee Latch  62 y.o. with complicated past medical history presents for evaluation of her cardiomyopathy.  She has a long history of neuromuscular disorder.  The exact diagnosis remains undetermined.  She has been confined to a wheelchair or a walker.  In 12/13, she was admitted to [facility] with hypotension, hypothermia, and altered mental status.   She was found to have profound anemia with hemoglobin down to 3.  She was transfused 4 units.  She also was found to have Enterococcal UTI and Serratia PNA.  She was intubated and eventually required tracheostomy.  During her hospitalization, she was found to have a cardiomyopathy with EF 20-25% by echo.  She was treated with milrinone.  Repeat echo in 5/14 showed EF improved to 50-55% with abnormal septal motion and last echo in 9/14 showed EF 55-60%.    Previously lasix was increased to 40 mg in am and 20 mg in pm along with 20 meq of potassium. I offered her a RHC however she declined. PFTs obtained as noted below and showed severe airway obstruction with subsequent evaluation by Dr [name].  At that time Dr [name] switched lisinopril to irbesartan. He felt that despite severity of her COPD, main limitation was her neuromuscular condition. Event monitor was worn in 1/15 due to tachypalpitations.  This showed PACs.  Event monitor was worn again in 12/17, this again showed PACs with short runs of atrial tachycardia.   Echo in 10/20 showed EF 60-65%, normal RV.  Echo in 5/23 showed EF 55-60%, normal RV.   She returns for followup of CHF and palpitations.  She continues to have periodic exacerbations of COPD/asthma for which she is given steroids. Prior generalized weakness is improved, she is not using a walker now.  Has occasional foot swelling that has been attributed to venous insufficiency.  No dyspnea unless she has an asthma flare.  No chest pain.  No  lightheadedness.  No orthopnea/PND.  Generally seems to be doing well.   ECG (personally reviewed): NSR, iRBBB  Labs (1/14): K 3.9, creatinine 0.21, HCT 30 Labs (4/14): K 4, creatinine 0.4, BNP 19 Labs (10/14): K 3.5, creatinine 0.5 Labs (12/14): K 3.6, creatinine 0.4 Labs (07/28/12): K 3.5 creatinine 0.5 Labs (08/01/13): K 3.5 Creatinine 0.5 Pro BNP 129 Labs (2/16): K 3.8, creatinine 0.44 Labs (9/16): K 3.6, creatinine 0.52, BNP 14 Labs (11/17): K 4.6, creatinine 0.53, BNP 16 Labs (5/18): K 3.7, creatinine 0.53 Labs (1/19): K 3.7, creatinine 0.56 Labs (12/19): K 3.9, creatinine 0.57 Labs (9/20): BNP 21 Labs (4/21): K 4.1, creatinine 0.6 Labs (3/22): K 3.8, creatinine 0.61, LDL 108 Labs (4/23): K 4.2, creatinine 0.62, LDL 89  PMH: 1. Anorexia/cachexia/failure to thrive: Patient had a PEG tube.  2. Neuromuscular disease: exact diagnosis is still undetermined despite extensive neurology evaluation.  She does not have multiple sclerosis.  3. Fibromyalgia 4. IBS 5. TMJ syndrome 6. PTSD 7. GERD 8. Sacral decubitus ulcer 9. H/o aspiration 10. 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  Echo (12/13) with EF 20-25% with diffuse hypokinesis, mild MR, moderate RV dilation and moderate RV dysfunction.  Echo (5/14) with EF 50-55%, abnormal septal motion.  Echo (9/14) with EF 55-60%.   - Echo (5/17): EF 60-65%.  - Echo (7/18): EF 60-65%, normal RV.  - Echo (10/20): EF 60-65%, normal RV - Echo (5/23): EF 55-60%, normal RV.  11. Anemia 12. COPD/asthma: PFTs 08/01/13 with FEV1 0.98 liters (33%), FVC 1.87 (50%), FEV 1/FVC 52%, FEF 25-75% 0.65 liter.  - She is on home oxygen.  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 Palpitations: Event monitor in 12/17 showed PACs and short runs of atrial tachycardia.  - 2/19 event monitor: She only wore it for a few days.  No significant arrhythmias.   SH: Lives  with husband in [place].  Quit smoking > 20 years ago.  No ETOH.     FH: No history of cardiomyopathy.  Grandfather had MI at 33.   ROS: All systems reviewed and negative except as per HPI.    Current Outpatient Medications  Medication Sig Dispense Refill   acetaminophen (TYLENOL) 325 MG tablet Take 975 mg by mouth in the morning.     azithromycin (ZITHROMAX) 250 MG tablet Take 1 tablet (250 mg total) by mouth as needed (for copd flare up). 6 each 11   bisoprolol (ZEBETA) 5 MG tablet TAKE 1/2 TABLET BY MOUTH DAILY 15 tablet 11   irbesartan (AVAPRO) 75 MG tablet TAKE ONE TABLET BY MOUTH ONCE DAILY 30 tablet 11   levalbuterol (XOPENEX HFA) 45 MCG/ACT inhaler Inhale up to 2 puffs every 4 hours as needed for shortness of breath or wheezing. 15 g 1   Magnesium 250 MG TABS Take 250 mg by mouth daily.     mometasone-formoterol (DULERA) 200-5 MCG/ACT AERO INHALE 2 PUFFS IN THE MORNING AND 2 PUFFS TWELVE HOURS LATER 13 g 0   Multiple Vitamin (MULTIVITAMIN) capsule Take 1 capsule by mouth daily.     omeprazole (PRILOSEC) 20 MG capsule Take 40 mg by mouth 2 (two) times daily before a meal.     OXYGEN 3 L at bedtime. Patient wear oxygen at night all the time and as needed during the day     predniSONE (DELTASONE) 10 MG tablet Take 1 tablet (10 mg total) by mouth as needed (for copd flare up). 100 tablet 2   Tiotropium Bromide Monohydrate (SPIRIVA RESPIMAT) 2.5 MCG/ACT AERS INHALE 2 SPRAY(S) BY MOUTH IN THE MORNING 4 g 11   No current facility-administered medications for this encounter.    BP (!) 100/50   Pulse 69   Wt 47.1 kg (103 lb 12.8 oz)   SpO2 99%   BMI 17.82 kg/m  General: NAD, thin Neck: No JVD, no thyromegaly or thyroid nodule.  Lungs: Clear to auscultation bilaterally with normal respiratory effort. CV: Nondisplaced PMI.  Heart regular S1/S2, no S3/S4, no murmur.  No peripheral edema.  No carotid bruit.  Normal pedal pulses.  Abdomen: Soft, nontender, no hepatosplenomegaly, no  distention.  Skin: Intact without lesions or rashes.  Neurologic: Alert and oriented x 3.  Psych: Normal affect. Extremities: No clubbing or cyanosis.  HEENT: Normal.   Assessment/Plan: 1. Cardiomyopathy: Last echo in 5/23 was normal.  I suspect that her prior low EF in 2013 was due to a stress (Takotsubo-type) cardiomyopathy.  She is not volume overloaded on exam.  Generally NYHA class I symptoms, has occasional dyspnea attributed to asthma.  - She can continue current bisoprolol and irbesartan.     - BMET today.   2. Dyspnea: Much improved over time.  She used to have quite significant chronic dyspnea. Suspect combination of COPD/asthma and deconditioning due to ?neuromuscular disorder, eating disorder.  As above, doubt active CHF.  She has been followed closely by Dr [name]. She is no longer wearing  oxygen.  She has periodic flares of asthma where she will take prednisone.  3. Neuromuscular disorder: Not fully characterized.  She used to be in a wheelchair, then a walker, now off the walker.  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 She seems to be functioning normally at this point.   Followup in 1 year    Fransico Meadow 11/03/2022

## 2022-11-19 ENCOUNTER — Telehealth: Payer: Self-pay | Admitting: Internal Medicine

## 2022-11-19 MED ORDER — SPIRIVA RESPIMAT 2.5 MCG/ACT IN AERS
2.0000 | INHALATION_SPRAY | Freq: Every day | RESPIRATORY_TRACT | 0 refills | Status: DC
Start: 2022-11-19 — End: 2023-03-09

## 2022-11-19 NOTE — Telephone Encounter (Signed)
Spoke with the pt  She is having issue with Nicolette Bang getting her spiriva filled on time  I left her samples for pick up  Nothing further needed

## 2022-11-19 NOTE — Telephone Encounter (Signed)
Pt insurance is only allowing her [name] to get filled every 30 days, [facility] but it never in stock and its not ready for her and she wanted to see if she can grab a spirivia sample from [place] to use in between these times. She stated she got one before from Dr. [name]. Pls advise

## 2023-02-12 ENCOUNTER — Telehealth: Payer: Self-pay | Admitting: Internal Medicine

## 2023-02-12 NOTE — Telephone Encounter (Signed)
Pt. Calling about Spriva and Dulera inhalers  needs a refill to hold her till apt. On 9.3.24 please advises pt. Her Phar. Is [facility] [address] in [place]

## 2023-02-15 ENCOUNTER — Other Ambulatory Visit: Payer: Self-pay | Admitting: Internal Medicine

## 2023-02-15 MED ORDER — SPIRIVA RESPIMAT 2.5 MCG/ACT IN AERS
INHALATION_SPRAY | RESPIRATORY_TRACT | 1 refills | Status: DC
Start: 2023-02-15 — End: 2023-03-09

## 2023-02-15 MED ORDER — DULERA 200-5 MCG/ACT IN AERO
INHALATION_SPRAY | RESPIRATORY_TRACT | 1 refills | Status: DC
Start: 2023-02-15 — End: 2023-03-09

## 2023-02-15 NOTE — Telephone Encounter (Signed)
done

## 2023-02-16 NOTE — Telephone Encounter (Signed)
Pt. Is aware the scripts got sent to pharmacy

## 2023-03-02 ENCOUNTER — Ambulatory Visit: Payer: 59 | Admitting: Internal Medicine

## 2023-03-05 ENCOUNTER — Other Ambulatory Visit: Payer: Self-pay | Admitting: Medical Genetics

## 2023-03-05 DIAGNOSIS — Z006 Encounter for examination for normal comparison and control in clinical research program: Secondary | ICD-10-CM

## 2023-03-08 NOTE — Progress Notes (Unsigned)
Subjective:   Patient ID: [name], female    DOB: Dec 30, 1960   MRN: [id number]    Brief patient profile:  62 yowf quit smoking 1989 then ? MS dx'd early 56's by [facility] (though pt states flatly she doesn't have MS)  and "it's been steadily downhill since" mostly muscle related but since 2012 also with breathing difficulty > admit:   Admit date: 06/06/2012  Discharge date: 06/20/2012  Discharge Diagnoses:  Acute respiratory failure  CAP (community acquired pneumonia) vs aspiration pneumonia  Acute encephalopathy, resolved  Neuromuscular disorder: not fully defined.  Anemia  Thrombocytopenia  Coagulopathy  Acute liver failure  Enterococcal UTI (pansensitive)  Shock circulatory, suspect primarily cardiogenic  Sacral Pressure Ulcer  Dilated cardiomyopathy  Malnutrition compromising bodily function  H/O anorexia nervosa  Dysphagia    History of Present Illness  08/17/2013 1st [facility] office visit/ [name] was able do some adl/s with walker and no 02 summer of 2014  then downhill since  And now sob at rest  X 2 weeks and able to lie down at 45 degrees and can't tol lying flat at all for sev weeks assoc with dry cough day and night and dysphagia/ choking sensation on acei.  Already on gerd rx/ inhaler no benefit.  rec Stop lisinopril Avapro 75 mg one daily  Prilosec 20 mg Take 30- 60 min before your first and last meals of the day  GERD diet      02/13/2022  f/u ov/[name] re: GOLD 0 PFTs  maint on spiriva/dulera no need for zmax / pred this past year  Chief Complaint  Patient presents with   Follow-up    Follow up. Patient has no complaints.   Dyspnea:  able to do some walking in stores, still  hc parking/ mailbox and back ok now maybe 150 ft round trip   Cough: still some with swallowing but better vs one year prior to OV   Sleeping: at 90 degrees still, her preference  SABA use: very rare 02: 3lpm hs / rarely during the day  Rec No change in medications       03/09/2023  yearly f/u ov/[name] re: GOLD 0 PFTs    maint on dulera / spiriva   Chief Complaint  Patient presents with   Follow-up    Doing well.  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    Also denies any obvious fluctuation of symptoms with weather or environmental changes or other aggravating or alleviating factors except as outlined above   No unusual exposure hx or h/o childhood pna/ asthma or knowledge of premature birth.  Current Allergies, Complete Past Medical History, Past Surgical History, Family History, and Social History were reviewed in [employer] record.  ROS  The following are not active complaints unless bolded Hoarseness, sore throat, dysphagia, dental problems, itching, sneezing,  nasal congestion or discharge of excess mucus or purulent secretions, ear ache,   fever, chills, sweats, unintended wt loss or wt gain, classically pleuritic or exertional cp,  orthopnea pnd or arm/hand swelling  or leg swelling, presyncope, palpitations,  abdominal pain, anorexia, nausea, vomiting, diarrhea  or change in bowel habits or change in bladder habits, change in stools or change in urine, dysuria, hematuria,  rash, arthralgias, visual complaints, headache, numbness, weakness or ataxia or problems with walking or coordination,  change in mood or  memory.        Current Meds  Medication Sig   acetaminophen (TYLENOL) 325 MG tablet Take 975 mg by mouth in the morning.   azithromycin (ZITHROMAX) 250 MG tablet Take 1 tablet (250 mg total) by mouth as needed (for copd flare up).   bisoprolol (ZEBETA) 5 MG tablet TAKE 1/2  TABLET BY MOUTH DAILY   irbesartan (AVAPRO) 75 MG tablet TAKE ONE TABLET BY MOUTH ONCE DAILY   levalbuterol (XOPENEX HFA) 45 MCG/ACT inhaler Inhale up to 2 puffs every 4 hours as needed for shortness of breath or wheezing.   Magnesium 250 MG TABS Take 250 mg by mouth daily.   mometasone-formoterol (DULERA) 200-5 MCG/ACT AERO INHALE 2 PUFFS IN THE MORNING AND 2 PUFFS TWELVE HOURS LATER   Multiple Vitamin (MULTIVITAMIN) capsule Take 1 capsule by mouth daily.   omeprazole (PRILOSEC) 20 MG capsule Take 40 mg by mouth 2 (two) times daily before a meal.   OXYGEN 3 L at bedtime. Patient wear oxygen at night all the time and as needed during the day   predniSONE (DELTASONE) 10 MG tablet Take 1 tablet (10 mg total) by mouth as needed (for copd flare up).   Tiotropium Bromide Monohydrate (SPIRIVA RESPIMAT) 2.5 MCG/ACT AERS Inhale 2 puffs into the lungs daily.                        Objective:   Physical Exam  wts   03/09/2023      101   02/13/2022     102  02/04/2021       103   08/06/2020       105 12/11/2019       105  05/12/2019     113   01/09/2019      112     08/17/13 113 lb (51.256 kg)  07/18/13 114 lb 6.4 oz (51.891 kg)  06/21/13 112 lb (50.803 kg)    Vital signs reviewed  03/09/2023  - Note at rest 02 sats  97% on RA   General appearance:    amb wf nad     HEENT : Oropharynx  clear   Nasal turbinates nl    NECK :  without  apparent JVD/ palpable Nodes/TM    LUNGS: no acc muscle use,  Min barrel  contour chest wall with bilateral  slightly decreased bs s audible wheeze and  without cough on insp or exp maneuvers and min  Hyperresonant  to  percussion bilaterally    CV:  RRR  no s3 or murmur or increase in P2, and no edema   ABD:  soft and nontender with pos end  insp Hoover's  in the supine position.  No bruits or organomegaly appreciated   MS:  Nl gait/ ext warm without deformities Or obvious joint restrictions  calf tenderness, cyanosis or clubbing     SKIN: warm and dry  without lesions    NEURO:  alert, approp, nl sensorium with  no motor or cerebellar deficits apparent.             Assessment & Plan:

## 2023-03-09 ENCOUNTER — Other Ambulatory Visit: Payer: 59

## 2023-03-09 ENCOUNTER — Ambulatory Visit (INDEPENDENT_AMBULATORY_CARE_PROVIDER_SITE_OTHER): Payer: 59 | Admitting: Internal Medicine

## 2023-03-09 ENCOUNTER — Encounter: Payer: Self-pay | Admitting: Internal Medicine

## 2023-03-09 VITALS — BP 102/58 | HR 75 | Temp 98.2°F | Ht 64.5 in | Wt 101.8 lb

## 2023-03-09 DIAGNOSIS — J449 Chronic obstructive pulmonary disease, unspecified: Secondary | ICD-10-CM

## 2023-03-09 DIAGNOSIS — Z1379 Encounter for other screening for genetic and chromosomal anomalies: Secondary | ICD-10-CM

## 2023-03-09 DIAGNOSIS — Z006 Encounter for examination for normal comparison and control in clinical research program: Secondary | ICD-10-CM

## 2023-03-09 MED ORDER — AZITHROMYCIN 250 MG PO TABS: 250.0000 mg | ORAL_TABLET | ORAL | 11 refills | Status: DC | PRN

## 2023-03-09 MED ORDER — SPIRIVA RESPIMAT 2.5 MCG/ACT IN AERS: 2.0000 | INHALATION_SPRAY | Freq: Every day | RESPIRATORY_TRACT | Status: DC

## 2023-03-09 MED ORDER — PREDNISONE 10 MG PO TABS: 10.0000 mg | ORAL_TABLET | ORAL | 2 refills | Status: DC | PRN

## 2023-03-09 MED ORDER — SPIRIVA RESPIMAT 2.5 MCG/ACT IN AERS: 2.0000 | INHALATION_SPRAY | Freq: Every day | RESPIRATORY_TRACT | 11 refills | Status: DC

## 2023-03-09 MED ORDER — DULERA 200-5 MCG/ACT IN AERO
INHALATION_SPRAY | RESPIRATORY_TRACT | 1 refills | Status: DC
Start: 2023-03-09 — End: 2023-03-11

## 2023-03-09 MED ORDER — LEVALBUTEROL TARTRATE 45 MCG/ACT IN AERO: INHALATION_SPRAY | RESPIRATORY_TRACT | 1 refills | Status: DC

## 2023-03-09 NOTE — Patient Instructions (Signed)
No change in medications    Please schedule a follow up visit in 6  months but call sooner if needed  

## 2023-03-10 ENCOUNTER — Telehealth: Payer: Self-pay | Admitting: Internal Medicine

## 2023-03-10 ENCOUNTER — Encounter: Payer: Self-pay | Admitting: Internal Medicine

## 2023-03-10 NOTE — Assessment & Plan Note (Addendum)
Quit smoking 1989 - see f/v loops 08/01/13 c/w upper airway obstruction > refused to repeat off acei  - trial off acei 08/18/2013 > marked improvement - 09/15/2013   Walked RA x about 50 ft and stopped due to  Fatigue s sob or desat - Spiriva trial 09/15/2013 > improved 10/27/13 so continue - added flutter valve 09/05/2014  - 10/25/2014  added dulera 100 2bid based on reported improvement with prednisone> much better - requiring prn pred since 05/2015 s documentation it helps (refuses pfts)  - V/q 04/18/2018   wnl ( not typical of copd and neg for PE)  . 09/07/2018  After extensive coaching inhaler device,  effectiveness =    90%    Adequate control on present rx, reviewed in detail with pt > no change in rx needed    F/u yearly - call sooner prn          Each maintenance medication was reviewed in detail including emphasizing most importantly the difference between maintenance and prns and under what circumstances the prns are to be triggered using an action plan format where appropriate.  Total time for H and P, chart review, counseling, reviewing hfa/smi device(s) and generating customized AVS unique to this office visit / same day charting = 21 min yearly eval

## 2023-03-10 NOTE — Telephone Encounter (Signed)
Patient needs a refill on Dulera. She was in office yesterday. The prescription got sent in as 2 instead of 12 (yearly supply).  [facility] [place]

## 2023-03-11 MED ORDER — DULERA 200-5 MCG/ACT IN AERO: INHALATION_SPRAY | RESPIRATORY_TRACT | 11 refills | Status: DC

## 2023-03-22 LAB — GENECONNECT MOLECULAR SCREEN: Genetic Analysis Overall Interpretation: NEGATIVE

## 2023-04-02 ENCOUNTER — Telehealth: Payer: Self-pay | Admitting: Internal Medicine

## 2023-04-02 NOTE — Telephone Encounter (Signed)
COPD Flare up from a Covid vaccine, never had a reaction before, but wants to make sure her reg COPD treatment works for this

## 2023-04-02 NOTE — Telephone Encounter (Signed)
Called and spoke to patient.  She stated that she had covid booster 09/25 and developed a COPD flare.  She is experiencing increased SOB, facial swelling, dry cough at times prod with yellow sputum, wheezing and chills x2d  She had prednisone on hand that she started today. She took 40mg  today.  She had azithromycin on hand but she has not started it.  She is using spiriva once daily, Dulera BID and xopenex once daily. She wears 3L at bedtime and PRN. She has had to use oxygen more over the past two days.   Katie, please advise. Dr. [name] is unavailable.

## 2023-04-02 NOTE — Telephone Encounter (Signed)
Patient is aware of below message/recommendations and voiced her understanding.  Nothing further needed.  

## 2023-04-02 NOTE — Telephone Encounter (Signed)
Possible coincidence with viral illness. She can continue prednisone taper. 4 tabs for 2 days, then 3 tabs for 2 days, 2 tabs for 2 days, then 1 tab for 2 days, then stop. Take in AM with food. Would hold off on abx right now since she's only had symptoms for two days and possibly viral. She should use mucinex twice a day, saline nasal rinses and flonase nasal spray. Viral symptoms can last 7-10 days. Should go to UC or ED if she worsens over the weekend. Call if no improvement for in person evaluation. Thanks.

## 2023-05-04 ENCOUNTER — Ambulatory Visit
Admission: RE | Admit: 2023-05-04 | Discharge: 2023-05-04 | Disposition: A | Discharge status: Home / Self Care | Payer: 59 | Source: Ambulatory Visit | Attending: Family Medicine | Admitting: Family Medicine

## 2023-05-04 DIAGNOSIS — M810 Age-related osteoporosis without current pathological fracture: Secondary | ICD-10-CM

## 2023-07-14 ENCOUNTER — Encounter: Payer: Self-pay | Admitting: Internal Medicine

## 2023-07-14 NOTE — Telephone Encounter (Signed)
 Called and spoke with Ms. Burlison who states her new insurance will not cover her Dulera . I've explained to her the process we would have to go through before appeal. I spoke with the pharmacy about the alternatives insurance recommended. The pharmacist states Asmenex, Qvar or Airduo. I called Ms. Bales to inform her of these options, and explained I would sent to Dr.Wert to get his recommendations on which is best. Dr.Wert please advise

## 2023-07-14 NOTE — Telephone Encounter (Signed)
 Airduo  113-14 one bid should be fine though the pharmacist will need to help her with the device initially

## 2023-07-16 MED ORDER — AIRDUO DIGIHALER 113-14 MCG/ACT IN AEPB: 1.0000 | INHALATION_SPRAY | Freq: Two times a day (BID) | RESPIRATORY_TRACT | 11 refills | Status: DC

## 2023-07-16 NOTE — Telephone Encounter (Signed)
 Order has been placed, pt has been notified nfn

## 2023-07-16 NOTE — Telephone Encounter (Signed)
Pt will like a call back

## 2023-07-17 NOTE — Telephone Encounter (Signed)
 Recent mychart sent by pt:  [name] Kass Sliger Bets  [name] (supporting [name] KATHEE [place], [name])Yesterday (1:31 PM)    This message is for [name] who has been helping me with this inhaler issue and per our conversation on Friday. The pharmacy messaged me that [name] does not accept [name], the medicine you sent in on Friday therefore it kicked out like my [name] .  I spoke with a rep at [facility] who indicated that [name], [name], or [name]  are the options they cover. I was also advised I will have to pay full coverage price for any of these meds until my deductible is met...which could cost me hundreds of dollars in trying to replace my [name]  with no guarantee they will work. She indicated from their end I did not have to try any of them at all if [name] wanted to send in the appeal straight away for the [name] . Is that something he's willing to do? I'd be happy to make an appointment and come in to discuss it with him. Please advise. [name]     Dr [name], please advise on this.

## 2023-07-18 NOTE — Telephone Encounter (Signed)
 I know this is a pain but the appear process is burdensome on everyone   On the other hand Breyna  and dulera  are as similar as the ford taurus and mercury sable  which were basically the same car if she recalls.    Rx is 160-4.5  Take 2 puffs first thing in am and then another 2 puffs about 12 hours later and she should do just as well.  She will still have to meet her deductible regardless of what she chooses.    Good luck!

## 2023-07-18 NOTE — Telephone Encounter (Signed)
 Dr. [name], please clarify which medication?

## 2023-07-20 MED ORDER — BUDESONIDE-FORMOTEROL FUMARATE 160-4.5 MCG/ACT IN AERO: 2.0000 | INHALATION_SPRAY | Freq: Two times a day (BID) | RESPIRATORY_TRACT | 12 refills | Status: DC

## 2023-07-20 NOTE — Addendum Note (Signed)
 Addended by: Lajoyce Lauber A on: 07/20/2023 11:29 AM   Modules accepted: Orders

## 2023-08-02 ENCOUNTER — Other Ambulatory Visit (HOSPITAL_COMMUNITY): Payer: Self-pay | Admitting: Cardiology

## 2023-08-02 DIAGNOSIS — R1900 Intra-abdominal and pelvic swelling, mass and lump, unspecified site: Secondary | ICD-10-CM

## 2023-08-02 DIAGNOSIS — R0602 Shortness of breath: Secondary | ICD-10-CM

## 2023-08-02 DIAGNOSIS — I5022 Chronic systolic (congestive) heart failure: Secondary | ICD-10-CM

## 2023-08-30 ENCOUNTER — Other Ambulatory Visit (HOSPITAL_COMMUNITY): Payer: Self-pay | Admitting: Cardiology

## 2023-10-19 ENCOUNTER — Telehealth: Payer: Self-pay | Admitting: Internal Medicine

## 2023-10-19 NOTE — Telephone Encounter (Signed)
 PT took a fall recently that has affected her breathing somewhat and she wants to discuss it w/Dr. [name]. She is willing to drive to [facility] to see him but needed an afternoon appt. Because she can not drive and needs her husband to take her. I can not override the "same day" appointment slots in the afternoon to work her in.  PT wonders if Dr. [name] will see her. Dr. Georgetta Kinnier remember her by her shaved head, she said. Pls call pt to advise otherwise I have nothing for her until June. Thanks.

## 2023-10-20 ENCOUNTER — Telehealth: Payer: Self-pay | Admitting: *Deleted

## 2023-10-20 NOTE — Telephone Encounter (Signed)
 Duplicate

## 2023-10-20 NOTE — Telephone Encounter (Signed)
 Yes that's fine

## 2023-10-20 NOTE — Telephone Encounter (Signed)
 Copied from CRM [id number]. Topic: Appointments - Scheduling Inquiry for Clinic >> Oct 18, 2023  8:38 AM Roseanne Cones wrote: Reason for CRM: Patient went to the ER [facility]) on 04/08 due to a fall straight on her head as she was having breathing issues - they did a series of Xrays and a CT. She has started to use oxygen again and is recovering from her fall. She is set to see her PCP on 04/16 and was looking to schedule a follow up with Dr. [name] to discuss her meds.   Contact Center agent was not able to find anything with Dr. [name] or the APPs within the next 7-14 days. Please consult with the doctors and schedules - Patient typically see Dr. [name] at the [facility] office and prefers afternoon appointments if possible - Please call patient to schedule appointment.   Dr [name]- please advise if ok to overbook next date here in [place] to see this pt. None of the APPS have openings. Thanks.

## 2023-10-21 ENCOUNTER — Encounter: Payer: Self-pay | Admitting: Family Medicine

## 2023-10-21 ENCOUNTER — Other Ambulatory Visit: Payer: Self-pay | Admitting: Family Medicine

## 2023-10-21 DIAGNOSIS — R9389 Abnormal findings on diagnostic imaging of other specified body structures: Secondary | ICD-10-CM

## 2023-10-21 NOTE — Telephone Encounter (Signed)
 I called and spoke with the pt  She states that she is fine with keeping her appt for 11/15/23  She seen PCP yesterday and is doing well  Nothing further needed

## 2023-10-28 ENCOUNTER — Ambulatory Visit
Admission: RE | Admit: 2023-10-28 | Discharge: 2023-10-28 | Disposition: A | Discharge status: Home / Self Care | Source: Ambulatory Visit | Attending: Family Medicine | Admitting: Family Medicine

## 2023-10-28 DIAGNOSIS — R9389 Abnormal findings on diagnostic imaging of other specified body structures: Secondary | ICD-10-CM

## 2023-11-07 ENCOUNTER — Encounter: Payer: Self-pay | Admitting: Internal Medicine

## 2023-11-14 NOTE — Progress Notes (Unsigned)
 Subjective:   Patient ID: [name], female    DOB: 1960-09-25   MRN: [id number]    Brief patient profile:  62 yowf quit smoking 1989 then ? MS dx'd early 74's by [facility] (though pt states flatly she doesn't have MS)  and "it's been steadily downhill since" mostly muscle related but since 2012 also with breathing difficulty > admit:   Admit date: 06/06/2012  Discharge date: 06/20/2012  Discharge Diagnoses:  Acute respiratory failure  CAP (community acquired pneumonia) vs aspiration pneumonia  Acute encephalopathy, resolved  Neuromuscular disorder: not fully defined.  Anemia  Thrombocytopenia  Coagulopathy  Acute liver failure  Enterococcal UTI (pansensitive)  Shock circulatory, suspect primarily cardiogenic  Sacral Pressure Ulcer  Dilated cardiomyopathy  Malnutrition compromising bodily function  H/O anorexia nervosa  Dysphagia    History of Present Illness  08/17/2013 1st [facility] office visit/ [name] was able do some adl/s with walker and no 02 summer of 2014  then downhill since  And now sob at rest  X 2 weeks and able to lie down at 45 degrees and can't tol lying flat at all for sev weeks assoc with dry cough day and night and dysphagia/ choking sensation on acei.  Already on gerd rx/ inhaler no benefit.  rec Stop lisinopril  Avapro  75 mg one daily  Prilosec 20 mg Take 30- 60 min before your first and last meals of the day  GERD diet    03/09/2023  yearly f/u ov/[name] re: GOLD 0 PFTs    maint on dulera  / spiriva    Chief Complaint  Patient presents with   Follow-up    Doing well.  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  Had CXR and then CT scan.  Had fall (hit head) in April.  Dyspnea:  much worse since last ov/  now on 3lpm 24/7  Cough: worse since early march 2025 > yellow never bloody / no cp fever or sweats   Sleeping: 90 degrees=  baseline s  resp cc  SABA use:  not using  02: 3lpm 24/7     No obvious day to day or daytime variability or assoc   mucus plugs or hemoptysis or cp or chest tightness, subjective wheeze or overt sinus or hb symptoms.    Also denies any obvious fluctuation of symptoms with weather or environmental changes or other aggravating or alleviating factors except as outlined above   No unusual exposure hx or h/o childhood pna/ asthma or knowledge of premature birth.  Current Allergies, Complete Past Medical History, Past Surgical History, Family History, and Social History were reviewed in [employer] record.  ROS  The following are not active complaints unless bolded Hoarseness, sore throat, dysphagia, dental problems, itching, sneezing,  nasal congestion or discharge of excess mucus or purulent secretions, ear ache,   fever, chills, sweats, unintended wt loss or wt gain, classically pleuritic or exertional cp,  orthopnea pnd or arm/hand swelling  or leg swelling, presyncope, palpitations, abdominal pain, anorexia, nausea, vomiting, diarrhea  or change in bowel habits or change in bladder habits, change in stools or change in urine, dysuria, hematuria,  rash, arthralgias, visual complaints, headache, numbness, weakness or ataxia or problems with walking or coordination,  change in mood or  memory.        Current Meds  Medication Sig   acetaminophen  (TYLENOL ) 325 MG tablet Take 975 mg by mouth in the morning.   bisoprolol  (ZEBETA ) 5 MG tablet TAKE 1/2 TABLET BY MOUTH DAILY   irbesartan  (AVAPRO ) 75 MG tablet TAKE ONE TABLET BY MOUTH  ONCE DAILY   levalbuterol  (XOPENEX  HFA) 45 MCG/ACT inhaler Inhale up to 2 puffs every 4 hours as needed for shortness of breath or wheezing.   Magnesium  250 MG TABS Take 250 mg by mouth daily.   mometasone -formoterol  (DULERA ) 200-5 MCG/ACT AERO INHALE 2 PUFFS IN THE MORNING AND 2 PUFFS TWELVE HOURS LATER   Multiple Vitamin (MULTIVITAMIN) capsule Take 1 capsule by mouth daily.   omeprazole  (PRILOSEC) 20 MG capsule Take 40 mg by mouth 2 (two) times daily before a meal.   OXYGEN 3 L at bedtime. Patient wear oxygen at night all the time and as needed during the day   Tiotropium Bromide  Monohydrate (SPIRIVA  RESPIMAT) 2.5 MCG/ACT AERS Inhale 2 puffs into the lungs daily.            Objective:   Physical Exam  wts   11/15/2023    106  03/09/2023      101   02/13/2022     102  02/04/2021       103   08/06/2020       105 12/11/2019       105  05/12/2019     113   01/09/2019      112     08/17/13 113 lb (51.256 kg)  07/18/13 114 lb 6.4 oz (51.891 kg)  06/21/13 112 lb (50.803 kg)    Vital signs reviewed  11/15/2023  - Note at rest 02 sats  100% on RA   General appearance:    chronically ill appearing wf nad     HEENT : Oropharynx  clear       NECK :  without  apparent JVD/ palpable Nodes/TM    LUNGS: no acc muscle use,  Min barrel  contour chest wall with bilateral  slightly decreased bs s audible wheeze and  without cough on insp or exp maneuvers and min  Hyperresonant  to  percussion bilaterally    CV:  RRR  no s3 or murmur or increase in P2, and no edema   ABD:  soft and nontender with pos end  insp Hoover's  in the supine position.  No bruits or organomegaly appreciated   MS:  Nl gait/ ext warm without deformities Or obvious joint restrictions  calf tenderness, cyanosis or clubbing     SKIN: warm and dry without lesions    NEURO:  alert, approp, nl sensorium with  no motor or cerebellar deficits apparent.        CT chest w/o contrast  10/28/23 1. New multifocal areas of peripheral  mucous plugging, nodules and ground-glass opacities throughout both lungs lower lungs. Some of these nodular densities are calcified. Findings are favored as infectious/inflammatory. Differential considerations include atypical infection such as fungal or mycobacterial, aspiration, or sarcoidosis. 2. Thick walled bulla or cavitary lesion in the left lung apex  measuring 3.6 x 1.5 x 1.9 cm. This is predominantly air-filled, but has some internal septations and nodularity. No air-fluid level. Findings may be infectious/inflammatory, but neoplasm not excluded. 3. 12 mm noncalcified nodular density in the medial left upper lobe is indeterminate.   CXR PA and Lateral:   11/15/2023 :    I personally reviewed images and agree with radiology impression as follows:    Chest x-ray findings support invasive aspergillosis, including the worsening reticulonodular opacities in the lower lungs and the appearance of fungus ball at the left apex. Other atypical infection could have this chest x-ray appearance.     Labs ordered/ reviewed:      Chemistry      Component Value Date/Time   NA 138 11/15/2023 1151   K 4.0 11/15/2023 1151   CL 100 11/15/2023 1151   CO2 30 11/15/2023 1151   BUN 14 11/15/2023 1151   CREATININE 0.50 11/15/2023 1151   CREATININE 0.57 07/02/2018 1034      Component Value Date/Time   CALCIUM 9.9 11/15/2023 1151   ALKPHOS 66 03/30/2018 1440   AST 15 03/30/2018 1440   ALT 20 03/30/2018 1440   BILITOT 0.3 03/30/2018 1440        Lab Results  Component Value Date   WBC 5.1 11/15/2023   HGB 11.8 (L) 11/15/2023   HCT 36.1 11/15/2023   MCV 87.6 11/15/2023   PLT 238.0 11/15/2023     Lab Results  Component Value Date   ESRSEDRATE 25 11/15/2023   ESRSEDRATE 2 06/25/2012   ESRSEDRATE 8 06/22/2012       Assessment & Plan:

## 2023-11-15 ENCOUNTER — Ambulatory Visit (INDEPENDENT_AMBULATORY_CARE_PROVIDER_SITE_OTHER)

## 2023-11-15 ENCOUNTER — Ambulatory Visit (INDEPENDENT_AMBULATORY_CARE_PROVIDER_SITE_OTHER): Admitting: Internal Medicine

## 2023-11-15 ENCOUNTER — Encounter: Payer: Self-pay | Admitting: Internal Medicine

## 2023-11-15 ENCOUNTER — Other Ambulatory Visit: Payer: Self-pay | Admitting: Internal Medicine

## 2023-11-15 VITALS — BP 110/74 | HR 80 | Temp 98.2°F | Ht 64.5 in | Wt 106.0 lb

## 2023-11-15 DIAGNOSIS — J449 Chronic obstructive pulmonary disease, unspecified: Secondary | ICD-10-CM

## 2023-11-15 DIAGNOSIS — B449 Aspergillosis, unspecified: Secondary | ICD-10-CM

## 2023-11-15 DIAGNOSIS — R0609 Other forms of dyspnea: Secondary | ICD-10-CM

## 2023-11-15 DIAGNOSIS — R5383 Other fatigue: Secondary | ICD-10-CM

## 2023-11-15 DIAGNOSIS — Z87891 Personal history of nicotine dependence: Secondary | ICD-10-CM

## 2023-11-15 DIAGNOSIS — J984 Other disorders of lung: Secondary | ICD-10-CM

## 2023-11-15 LAB — CBC WITH DIFFERENTIAL/PLATELET
Basophils Absolute: 0 10*3/uL (ref 0.0–0.1)
Basophils Relative: 0.5 % (ref 0.0–3.0)
Eosinophils Absolute: 0.2 10*3/uL (ref 0.0–0.7)
Eosinophils Relative: 4.3 % (ref 0.0–5.0)
HCT: 36.1 % (ref 36.0–46.0)
Hemoglobin: 11.8 g/dL — ABNORMAL LOW (ref 12.0–15.0)
Lymphocytes Relative: 15.2 % (ref 12.0–46.0)
Lymphs Abs: 0.8 10*3/uL (ref 0.7–4.0)
MCHC: 32.7 g/dL (ref 30.0–36.0)
MCV: 87.6 fl (ref 78.0–100.0)
Monocytes Absolute: 0.3 10*3/uL (ref 0.1–1.0)
Monocytes Relative: 6.6 % (ref 3.0–12.0)
Neutro Abs: 3.8 10*3/uL (ref 1.4–7.7)
Neutrophils Relative %: 73.4 % (ref 43.0–77.0)
Platelets: 238 10*3/uL (ref 150.0–400.0)
RBC: 4.12 Mil/uL (ref 3.87–5.11)
RDW: 15.2 % (ref 11.5–15.5)
WBC: 5.1 10*3/uL (ref 4.0–10.5)

## 2023-11-15 LAB — BASIC METABOLIC PANEL WITH GFR
BUN: 14 mg/dL (ref 6–23)
CO2: 30 meq/L (ref 19–32)
Calcium: 9.9 mg/dL (ref 8.4–10.5)
Chloride: 100 meq/L (ref 96–112)
Creatinine, Ser: 0.5 mg/dL (ref 0.40–1.20)
GFR: 101.73 mL/min (ref >60.00)
Glucose, Bld: 86 mg/dL (ref 70–99)
Potassium: 4 meq/L (ref 3.5–5.1)
Sodium: 138 meq/L (ref 135–145)

## 2023-11-15 LAB — SEDIMENTATION RATE: Sed Rate: 25 mm/h (ref 0–30)

## 2023-11-15 MED ORDER — LEVOFLOXACIN 500 MG PO TABS: 500.0000 mg | ORAL_TABLET | Freq: Every day | ORAL | 0 refills | Status: DC

## 2023-11-15 MED ORDER — MOMETASONE FURO-FORMOTEROL FUM 100-5 MCG/ACT IN AERO: INHALATION_SPRAY | RESPIRATORY_TRACT | 11 refills | Status: DC

## 2023-11-15 NOTE — Telephone Encounter (Signed)
Please advise change. 

## 2023-11-15 NOTE — Telephone Encounter (Signed)
 Pharmacy comment: ins wont cover. They say,asmanex, or qvar please.   Please send in RX

## 2023-11-15 NOTE — Patient Instructions (Addendum)
 Please remember to go to the lab department   for your tests - we will call you with the results when they are available.      Please remember to go to the  x-ray department  for your tests - we will call you with the results when they are available    Reduce the dulera  to 200 one every 12 hours until you use it up then go to dulera  100 Take 2 puffs first thing in am and then another 2 puffs about 12 hours later.    Levaquin  500 mg daily x 10 days   Dr Marygrace Snellen next available new patient evaluation

## 2023-11-16 ENCOUNTER — Ambulatory Visit: Payer: Self-pay | Admitting: Internal Medicine

## 2023-11-16 DIAGNOSIS — J984 Other disorders of lung: Secondary | ICD-10-CM | POA: Insufficient documentation

## 2023-11-16 NOTE — Telephone Encounter (Signed)
 Copied from CRM [id number]. Topic: Clinical - Red Word Triage >> Nov 16, 2023  3:42 PM Tyronne Galloway wrote: Red Word that prompted transfer to Nurse Triage: Pt stated her hands hips and legs are swollen (from other causes that isn't new), weak, and numb. Pt stated it could be reactions from side effects from taking the generic version of levofloxacin  (LEVAQUIN ) 500 MG tablet. This has been happening since this morning.  TRIAGE SUMMARY NOTE: Pt reporting that she has new med levofloxacin  that she took today at 2:30 pm, reporting that she just started feeling widespread weakness and numbness "10 min before calling." Pt confirms she is not too weak to stand, numbness and tingling "like they're asleep, just super heavy" for hips down as well as her hands. Pt confirms that she has never felt this before, no previous issues with numbness, confirms swelling to hips and legs "as usual" but no more than her normal. Advised pt call 911 due to sudden onset and severity of numbness, pt verbalized understanding and intent to do so.  E2C2 Pulmonary Triage - Initial Assessment Questions "Chief Complaint (e.g., cough, sob, wheezing, fever, chills, sweat or additional symptoms) *Go to specific symptom protocol after initial questions. New med levofloxacin  at 2:30 pm, just started feeling weakness and numbness 10 min before calling Can stand but picking them up, feel like they're asleep, just super heavy, hips down and hands No further swelling to hips and legs than usual  Reason for Disposition  Sounds like a life-threatening emergency to the triager  Protocols used: Neurologic [facility]

## 2023-11-16 NOTE — Assessment & Plan Note (Addendum)
 Symptom onset Fall  of 2024 with productive cough since March 2025 - CT 10/28/23 with cavitary changes and cxr 11/15/2023 suggestive of mycetoma forming  - Labs 11/15/2023  with ESR 25/ afb and routine cultures >>>   I doubt getting aspergillos on sputum will add anything to dx but she would be a difficult bronch due to inability to lie down x years   Will discuss options with colleagues for new step.         Each maintenance medication was reviewed in detail including emphasizing most importantly the difference between maintenance and prns and under what circumstances the prns are to be triggered using an action plan format where appropriate.  Total time for H and P, chart review, counseling, reviewing hfa/smi/02  device(s) and generating customized AVS unique to this office visit / same day charting =   30 min for multiple  refractory respiratory  symptoms /problems of uncertain etiology

## 2023-11-16 NOTE — Telephone Encounter (Signed)
 Discussed with pt, feeling better and declines ER so will hold off further levaquin  and plan FOB / bx for ? Aspergilosis? Vs ID consult 1st

## 2023-11-16 NOTE — Assessment & Plan Note (Signed)
 Quit smoking 1989 - see f/v loops 08/01/13 c/w upper airway obstruction > refused to repeat off acei  - trial off acei 08/18/2013 > marked improvement - 09/15/2013   Walked RA x about 50 ft and stopped due to  Fatigue s sob or desat - Spiriva  trial 09/15/2013 > improved 10/27/13 so continue - added flutter valve 09/05/2014  - 10/25/2014  added dulera  100 2bid based on reported improvement with prednisone > much better - requiring prn pred since 05/2015 s documentation it helps (refuses pfts)  - V/q 04/18/2018   wnl ( not typical of copd and neg for PE)  . 09/07/2018  After extensive coaching inhaler device,  effectiveness =    90%    In view of likely fungal or atypical TB infection rec reduced dulera  to 100 2bid if tol then down to one bid and continue spiriva  smi 2.5 x 2 puffs each am

## 2023-11-16 NOTE — Telephone Encounter (Signed)
 No need to go to the ED.  I have discussed with Dr. Michael Wert and he will give the patient a call or he will send a message to triage.

## 2023-11-16 NOTE — Telephone Encounter (Signed)
 Please advise as pt will not go to ED

## 2023-11-16 NOTE — Telephone Encounter (Signed)
 FYI pt was seen yesterday

## 2023-11-16 NOTE — Telephone Encounter (Signed)
  E2C2 Pulmonary Triage - Initial Assessment Questions "Chief Complaint (e.g., cough, sob, wheezing, fever, chills, sweat or additional symptoms) *Go to specific symptom protocol after initial questions. [name] called after being evaluated by 911 crew. [name] had called earlier today after taking her first dose of levaquin . [name] developed bilateral numbness to lower extremities and hands.  911 recommended for possible allergic reaction. Per [name] report, EMS evaluated [name] and stated she wasn't emergent and she could make the decision to go to the ED. Discussed with [name] the continued numbness and concern of needing other testing. [name] had researched the drug and found that numbness was a side effect. Per protocol, [name] is recommended to the ED. [name] verbalized understanding but wouldn't agree at this time to go to the ED. [name] states she might but not at this time. Wanted to make sure that Dr.Wert was aware of her reaction. [name] states she wouldn't take any other antibiotic without speaking to Dr. [name]. All questions answered.   "How long have symptoms been present?" Numbness started 10 minutes after taking Levaquin   Copied from CRM [id number]. Topic: Clinical - Red Word Triage >> Nov 16, 2023  4:26 PM [name] wrote: Red Word that prompted transfer to Nurse Triage: Severe allergic reaction Reason for Disposition  [name] sounds very sick or weak to the triager  Answer Assessment - Initial Assessment Questions 1. NAME of MEDICINE: "What medicine(s) are you calling about?"     Levaquin  2. QUESTION: "What is your question?" (e.g., double dose of medicine, side effect) [name] is concerned with side effects she has developed after taking one dose of Levaquin      [name]  3. PRESCRIBER: "Who prescribed the medicine?" Reason: if prescribed by specialist, call should be referred to that group.     Wert 4. SYMPTOMS: "Do you have any symptoms?" If Yes, ask: "What symptoms are  you having?"  "How bad are the symptoms (e.g., mild, moderate, severe)     developed bilateral numbness to upper and lower extremities  Answer Assessment - Initial Assessment Questions 1. SYMPTOM: "What is the main symptom you are concerned about?" (e.g., weakness, numbness)     Bilateral numbness 2. ONSET: "When did this start?" (minutes, hours, days; while sleeping)     Started shortly after 2:30 PM today 3. LAST NORMAL: "When was the last time you (the [name]) were normal (no symptoms)?"     Prior to taking Levaquin -first dose was today 4. PATTERN "Does this come and go, or has it been constant since it started?"  "Is it present now?"     constant 5. CARDIAC SYMPTOMS: "Have you had any of the following symptoms: chest pain, difficulty breathing, palpitations?"     no 6. NEUROLOGIC SYMPTOMS: "Have you had any of the following symptoms: headache, dizziness, vision loss, double vision, changes in speech, unsteady on your feet?"     no 7. OTHER SYMPTOMS: "Do you have any other symptoms?"      Had swelling to hands earlier but no other symptoms.  Protocols used: Medication Question Call-A-AH, Neurologic Deficit-A-AH

## 2023-11-17 LAB — QUANTIFERON-TB GOLD PLUS
Mitogen-NIL: 1.18 [IU]/mL
NIL: 0.02 [IU]/mL
QuantiFERON-TB Gold Plus: NEGATIVE
TB1-NIL: 0.02 [IU]/mL
TB2-NIL: 0 [IU]/mL

## 2023-11-18 ENCOUNTER — Encounter: Payer: Self-pay | Admitting: Internal Medicine

## 2023-11-18 DIAGNOSIS — J984 Other disorders of lung: Secondary | ICD-10-CM

## 2023-11-18 DIAGNOSIS — B4481 Allergic bronchopulmonary aspergillosis: Secondary | ICD-10-CM

## 2023-11-19 MED ORDER — VORICONAZOLE 40 MG/ML PO SUSR: 200.0000 mg | Freq: Two times a day (BID) | ORAL | 0 refills | Status: DC

## 2023-11-19 NOTE — Telephone Encounter (Signed)
**Note De-identified  [place] Obfuscation** Please advise 

## 2023-11-22 ENCOUNTER — Ambulatory Visit (HOSPITAL_COMMUNITY)
Admission: RE | Admit: 2023-11-22 | Discharge: 2023-11-22 | Disposition: A | Discharge status: Home / Self Care | Source: Ambulatory Visit | Attending: Cardiology | Admitting: Cardiology

## 2023-11-22 ENCOUNTER — Encounter (HOSPITAL_COMMUNITY): Payer: Self-pay | Admitting: Cardiology

## 2023-11-22 ENCOUNTER — Telehealth: Payer: Self-pay

## 2023-11-22 VITALS — BP 104/60 | HR 57 | Wt 105.0 lb

## 2023-11-22 DIAGNOSIS — R0600 Dyspnea, unspecified: Secondary | ICD-10-CM | POA: Insufficient documentation

## 2023-11-22 DIAGNOSIS — R0781 Pleurodynia: Secondary | ICD-10-CM | POA: Diagnosis not present

## 2023-11-22 DIAGNOSIS — Z8679 Personal history of other diseases of the circulatory system: Secondary | ICD-10-CM | POA: Diagnosis present

## 2023-11-22 DIAGNOSIS — J4489 Other specified chronic obstructive pulmonary disease: Secondary | ICD-10-CM | POA: Diagnosis not present

## 2023-11-22 DIAGNOSIS — G709 Myoneural disorder, unspecified: Secondary | ICD-10-CM | POA: Insufficient documentation

## 2023-11-22 DIAGNOSIS — R002 Palpitations: Secondary | ICD-10-CM | POA: Diagnosis present

## 2023-11-22 DIAGNOSIS — Z79899 Other long term (current) drug therapy: Secondary | ICD-10-CM | POA: Insufficient documentation

## 2023-11-22 DIAGNOSIS — Z681 Body mass index (BMI) 19 or less, adult: Secondary | ICD-10-CM | POA: Insufficient documentation

## 2023-11-22 DIAGNOSIS — Z9981 Dependence on supplemental oxygen: Secondary | ICD-10-CM | POA: Insufficient documentation

## 2023-11-22 DIAGNOSIS — F509 Eating disorder, unspecified: Secondary | ICD-10-CM | POA: Diagnosis not present

## 2023-11-22 DIAGNOSIS — I5022 Chronic systolic (congestive) heart failure: Secondary | ICD-10-CM

## 2023-11-22 DIAGNOSIS — Z87891 Personal history of nicotine dependence: Secondary | ICD-10-CM | POA: Insufficient documentation

## 2023-11-22 DIAGNOSIS — I429 Cardiomyopathy, unspecified: Secondary | ICD-10-CM | POA: Insufficient documentation

## 2023-11-22 DIAGNOSIS — R918 Other nonspecific abnormal finding of lung field: Secondary | ICD-10-CM | POA: Insufficient documentation

## 2023-11-22 NOTE — Telephone Encounter (Signed)
 Copied from CRM [id number]. Topic: General - Other >> Nov 19, 2023 11:19 AM Margarette Shawl wrote: Reason for CRM:   Pt is contacting clinic to speak with the practice manager, Sammi Crick. When asking what this is regarding, she did not want to provide any more information but asked for a call back urgently.  CB#  [phone]    Lm x1 for patient.

## 2023-11-22 NOTE — Telephone Encounter (Signed)
 Let her know that's fine if we can get the labs back this week but go ahead and refer to cynthis sny[facility] next available  Dx semi invasive aspgergillosis

## 2023-11-22 NOTE — Patient Instructions (Signed)
 There has been no changes to your medications.  Your physician recommends that you schedule a follow-up appointment in: 1 year ( may 2026) ** PLEASE CALL THE OFFICE IN FEBRUARY 2026 TO ARRANGE YOUR FOLLOW UP APPOINTMENT.**  If you have any questions or concerns before your next appointment please send us  a message through [place] or call our office at [phone].    TO LEAVE A MESSAGE FOR THE NURSE SELECT OPTION 2, PLEASE LEAVE A MESSAGE INCLUDING: YOUR NAME DATE OF BIRTH CALL BACK NUMBER REASON FOR CALL**this is important as we prioritize the call backs  YOU WILL RECEIVE A CALL BACK THE SAME DAY AS LONG AS YOU CALL BEFORE 4:00 PM  At the [facility], you and your health needs are our priority. As part of our continuing mission to provide you with exceptional heart care, we have created designated Provider Care Teams. These Care Teams include your primary Cardiologist (physician) and Advanced Practice Providers (APPs- Physician Assistants and Nurse Practitioners) who all work together to provide you with the care you need, when you need it.   You may see any of the following providers on your designated Care Team at your next follow up: Dr Jules Oar Dr Peder Bourdon Dr. Alwin Baars Dr. Arta Lark Amy Marijane Shoulders, NP Ruddy Corral, [place] [facility] [place], [place] Dennise Fitz, NP [place] Lee, NP Shawnee Dellen, NP Luster Salters, PharmD Bevely Brush, PharmD   Please be sure to bring in all your medications bottles to every appointment.    Thank you for choosing [facility]

## 2023-11-23 ENCOUNTER — Telehealth: Payer: Self-pay | Admitting: *Deleted

## 2023-11-23 ENCOUNTER — Telehealth: Payer: Self-pay

## 2023-11-23 NOTE — Telephone Encounter (Signed)
 Copied from CRM [id number]. Topic: General - Other >> Nov 23, 2023  8:08 AM Hilton Lucky wrote: Reason for CRM: Patient is returning call to practice admin. Please return call ASAP per patient.  Lm for patient.  Please refer to 11/22/2023 phone note.

## 2023-11-23 NOTE — Telephone Encounter (Signed)
 I spoke with the pt  She states that she spoke with Dr. [name] personally on 11/19/23 and he answered all of her questions  She states that she has no concerns to discuss with Margie  Nothing further needed

## 2023-11-23 NOTE — Telephone Encounter (Signed)
 Copied from CRM [id number]. Topic: Referral - Status >> Nov 23, 2023  8:13 AM Hilton Lucky wrote: Reason for CRM: Patient inquiring about information regarding referral for [facility]. Informed referral placed per chart encounter, patient would prefer to have nurse call her regarding update anyway.  Pt is scheduled with [facility] 12/14/23  She states there is nothing further needed

## 2023-11-23 NOTE — Progress Notes (Signed)
 Patient ID: [name], female   DOB: [birth year], 63 y.o.   MRN: [id number] PCP: Dr. Alphonzo Jenkins Cardiology: Dr. Mitzie Anda  Chief complaint: Shortness of breath  63 y.o. with complicated past medical history presents for evaluation of her cardiomyopathy.  She has a long history of neuromuscular disorder.  The exact diagnosis remains undetermined.  She has been confined to a wheelchair or a walker.  In 12/13, she was admitted to [facility] with hypotension, hypothermia, and altered mental status.   She was found to have profound anemia with hemoglobin down to 3.  She was transfused 4 units.  She also was found to have Enterococcal UTI and Serratia PNA.  She was intubated and eventually required tracheostomy.  During her hospitalization, she was found to have a cardiomyopathy with EF 20-25% by echo.  She was treated with milrinone .  Repeat echo in 5/14 showed EF improved to 50-55% with abnormal septal motion and last echo in 9/14 showed EF 55-60%.    Previously lasix  was increased to 40 mg in am and 20 mg in pm along with 20 meq of potassium. I offered her a RHC however she declined. PFTs obtained as noted below and showed severe airway obstruction with subsequent evaluation by Dr [name].  At that time Dr [name] switched lisinopril  to irbesartan . He felt that despite severity of her COPD, main limitation was her neuromuscular condition. Event monitor was worn in 1/15 due to tachypalpitations.  This showed PACs.  Event monitor was worn again in 12/17, this again showed PACs with short runs of atrial tachycardia.   Echo in 10/20 showed EF 60-65%, normal RV.  Echo in 5/23 showed EF 55-60%, normal RV.   She returns for followup of CHF and palpitations.  Patient had CT chest in 4/25 showing cavitary change, question of fungal infection versus atypical mycobacterium.  She has a productive cough and has been more short of breath, now back on 3L home oxygen at all times.  More fatigued in general.  Occasional  palpitations but none recently.  No orthopnea/PND. Weight stable.  She has occasional pleuritic chest pain.   ECG (personally reviewed): NSR, normal  Labs (1/14): K 3.9, creatinine 0.21, HCT 30 Labs (4/14): K 4, creatinine 0.4, BNP 19 Labs (10/14): K 3.5, creatinine 0.5 Labs (12/14): K 3.6, creatinine 0.4 Labs (07/28/12): K 3.5 creatinine 0.5 Labs (08/01/13): K 3.5 Creatinine 0.5 Pro BNP 129 Labs (2/16): K 3.8, creatinine 0.44 Labs (9/16): K 3.6, creatinine 0.52, BNP 14 Labs (11/17): K 4.6, creatinine 0.53, BNP 16 Labs (5/18): K 3.7, creatinine 0.53 Labs (1/19): K 3.7, creatinine 0.56 Labs (12/19): K 3.9, creatinine 0.57 Labs (9/20): BNP 21 Labs (4/21): K 4.1, creatinine 0.6 Labs (3/22): K 3.8, creatinine 0.61, LDL 108 Labs (4/23): K 4.2, creatinine 0.62, LDL 89 Labs (4/24): LDL 87 Labs (5/24): K 4, creatinine 0.5  PMH: 1. Anorexia/cachexia/failure to thrive: Patient had a PEG tube.  2. Neuromuscular disease: exact diagnosis is still undetermined despite extensive neurology evaluation.  She does not have multiple sclerosis.  3. Fibromyalgia 4. IBS 5. TMJ syndrome 6. PTSD 7. GERD 8. Sacral decubitus ulcer 9. H/o aspiration 10. 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  Echo (12/13) with EF 20-25% with diffuse hypokinesis, mild MR, moderate RV dilation and moderate RV dysfunction.  Echo (5/14) with EF 50-55%, abnormal septal motion.  Echo (9/14) with EF 55-60%.   - Echo (5/17): EF 60-65%.  - Echo (7/18): EF 60-65%, normal RV.  -  Echo (10/20): EF 60-65%, normal RV - Echo (5/23): EF 55-60%, normal RV. 11. Anemia 12. COPD/asthma: PFTs 08/01/13 with FEV1 0.98 liters (33%), FVC 1.87 (50%), FEV 1/FVC 52%, FEF 25-75% 0.65 liter.  - She is on home oxygen.  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 Palpitations: Event monitor in 12/17 showed PACs and short runs of atrial tachycardia.  - 2/19  event monitor: She only wore it for a few days.  No significant arrhythmias.  16. 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   SH: Lives with husband in [place].  Quit smoking > 20 years ago.  No ETOH.     FH: No history of cardiomyopathy.  Grandfather had MI at 63.   ROS: All systems reviewed and negative except as per HPI.    Current Outpatient Medications  Medication Sig Dispense Refill   acetaminophen  (TYLENOL ) 325 MG tablet Take 975 mg by mouth in the morning.     azithromycin  (ZITHROMAX ) 250 MG tablet Take 1 tablet (250 mg total) by mouth as needed (for copd flare up). 6 each 11   bisoprolol  (ZEBETA ) 5 MG tablet TAKE 1/2 TABLET BY MOUTH DAILY 45 tablet 3   irbesartan  (AVAPRO ) 75 MG tablet TAKE ONE TABLET BY MOUTH ONCE DAILY 30 tablet 11   levalbuterol  (XOPENEX  HFA) 45 MCG/ACT inhaler Inhale up to 2 puffs every 4 hours as needed for shortness of breath or wheezing. 15 g 1   Magnesium  250 MG TABS Take 250 mg by mouth daily.     mometasone -formoterol  (DULERA ) 100-5 MCG/ACT AERO INHALE 2 PUFFS BY MOUTH FIRST THING IN THE MORNING AND 2 PUFFS ABOUT 12 HOURS LATER 13 g 11   Multiple Vitamin (MULTIVITAMIN) capsule Take 1 capsule by mouth daily.     omeprazole  (PRILOSEC) 20 MG capsule Take 40 mg by mouth 2 (two) times daily before a meal.     OXYGEN Inhale 2 application  into the lungs continuous.     Tiotropium Bromide  Monohydrate (SPIRIVA  RESPIMAT) 2.5 MCG/ACT AERS Inhale 2 puffs into the lungs daily. 4 g 11   OXYGEN 3 L at bedtime. Patient wear oxygen at night all the time and as needed during the day     predniSONE  (DELTASONE ) 10 MG tablet Take 1 tablet (10 mg total) by mouth as needed (for copd flare up). 100 tablet 2   voriconazole  (VFEND ) 40 MG/ML suspension Take 5 mLs (200 mg total) by mouth 2 (two) times daily. (Patient not taking: Reported on 11/22/2023) 75 mL 0   No current facility-administered medications for this encounter.    BP 104/60   Pulse (!) 57   Wt  47.6 kg (105 lb)   SpO2 100% Comment: 2 l n/c  BMI 17.74 kg/m  General: NAD, thin Neck: No JVD, no thyromegaly or thyroid  nodule.  Lungs: Occasional rhonchi.  CV: Nondisplaced PMI.  Heart regular S1/S2, no S3/S4, no murmur.  No peripheral edema.  No carotid bruit.  Normal pedal pulses.  Abdomen: Soft, nontender, no hepatosplenomegaly, no distention.  Skin: Intact without lesions or rashes.  Neurologic: Alert and oriented x 3.  Psych: Normal affect. Extremities: No clubbing or cyanosis.  HEENT: Normal.   Assessment/Plan: 1. Cardiomyopathy: Last echo in 5/23 was normal.  I suspect that her prior low EF in 2013 was due to a stress (Takotsubo-type) cardiomyopathy.  She is not volume overloaded on exam. She is not volume overloaded on exam.  I suspect her respiratory symptoms and  home oxygen need are lung-related.  - She can continue current bisoprolol  and irbesartan .     - I will hold off on repeat echo unless she develops signs of CHF.  2. Dyspnea: She used to have quite significant chronic dyspnea. Suspect combination of COPD/asthma and deconditioning due to ?neuromuscular disorder, eating disorder.  As above, doubt active CHF.  She has been followed closely by Dr [name]. She is back on home oxygen again, now with cavitary lung lesions from fungus vs atypical mycobacteria.  3. Neuromuscular disorder: Not fully characterized.  She used to be in a wheelchair, then a walker, now off the walker.  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 She seems to be functioning normally at this point (no longer uses mobility aid).   Followup in 1 year   I spent 22 minutes reviewing data, interviewing patient and his family, and organizing the orders/followup.    Suraiya Dickerson,MD 11/23/2023

## 2023-11-23 NOTE — Telephone Encounter (Signed)
 Copied from CRM [id number]. Topic: Clinical - Medical Advice >> Nov 18, 2023 10:50 AM [name] wrote: Reason for CRM: Patient is calling to find out if she needs to have biopsy completed - states she's confused on what the plan is.  I called and spoke with the pt  She states that she spoke with Dr. [name] and he answered her questions  Nothing further needed

## 2023-11-23 NOTE — Addendum Note (Signed)
 Addended by: Alyse Bach on: 11/23/2023 09:36 AM   Modules accepted: Orders

## 2023-11-26 LAB — FUNGITELL BETA-D-GLUCAN
Fungitell Value:: <31.25 pg/mL
Result Name:: NEGATIVE

## 2023-11-27 ENCOUNTER — Encounter: Payer: Self-pay | Admitting: Internal Medicine

## 2023-11-27 LAB — ASPERGILLUS ANTIGEN,SERUM
Aspergillus Ag, EIA: NOT DETECTED
Index Value: 0.04 (ref <0.50)

## 2023-11-27 LAB — ASPERGILLUS ANTIBODY BY IMMUNODIFF: Aspergillus Antibody ID: NEGATIVE

## 2023-12-14 ENCOUNTER — Ambulatory Visit (INDEPENDENT_AMBULATORY_CARE_PROVIDER_SITE_OTHER): Admitting: Internal Medicine

## 2023-12-14 ENCOUNTER — Encounter: Payer: Self-pay | Admitting: Internal Medicine

## 2023-12-14 ENCOUNTER — Other Ambulatory Visit: Payer: Self-pay

## 2023-12-14 VITALS — BP 107/64 | HR 78 | Resp 16 | Ht 64.5 in | Wt 111.2 lb

## 2023-12-14 DIAGNOSIS — J984 Other disorders of lung: Secondary | ICD-10-CM | POA: Diagnosis not present

## 2023-12-14 DIAGNOSIS — R918 Other nonspecific abnormal finding of lung field: Secondary | ICD-10-CM | POA: Diagnosis not present

## 2023-12-14 DIAGNOSIS — R9389 Abnormal findings on diagnostic imaging of other specified body structures: Secondary | ICD-10-CM | POA: Diagnosis not present

## 2023-12-14 NOTE — Addendum Note (Signed)
 Addended by: Rubena Roseman A on: 12/14/2023 04:25 PM   Modules accepted: Orders

## 2023-12-14 NOTE — Addendum Note (Signed)
 Addended by: Zaniyah Wernette A on: 12/14/2023 04:13 PM   Modules accepted: Orders

## 2023-12-14 NOTE — Progress Notes (Signed)
 [facility]  Reason for Consult:cavitary lung disease Referring Provider: Vernestine Gondola (pulmonology)    Patient Active Problem List   Diagnosis Date Noted   Cavitary lung disease 11/16/2023   COPD GOLD 0 and refuses further testing 05/28/2016   Upper airway cough syndrome 08/19/2015   Dysphagia, pharyngoesophageal phase 10/29/2014   Left-sided chest wall pain 05/07/2014   DOE (dyspnea on exertion) 08/18/2013   Palpitations 07/18/2013   Chronic systolic CHF (congestive heart failure) (HCC) 08/30/2012   Tracheostomy status (HCC) 07/07/2012   Physical deconditioning 07/07/2012   Dilated cardiomyopathy (HCC) 06/09/2012   Malnutrition compromising bodily function (HCC) 06/09/2012   H/O anorexia nervosa 06/09/2012   Lower urinary tract infectious disease 06/08/2012   Acute encephalopathy, resolved 06/07/2012   Anemia 06/07/2012   Thrombocytopenia (HCC) 06/07/2012   Coagulopathy (HCC) 06/07/2012   Acute liver failure 06/07/2012   Neuromuscular disorder: not fully defined.  06/07/2012      HPI: [name] is a 63 y.o. female referred to id for cxr suggestion of fungal ball   Reviewed chart Last seen pulm dr [name] 11/15/2023 Former smoker quit 1989 ?hx MS but patient denied   Progressive health decline since 2014 Dry cough chronic Can't lay flat Dyspnea on exertion No improvement with inhaler -- maintained on spiriva /dulera . Prednisone  use in the past ?reflux and no improvement with GERD tx   03/2023 GOLD 0 PFT's 10/2023 abnormal chest ct bilateral nodules and cavitary changes 11/15/23 gold 0 pft's Continued progression of respiratory sx/activity intolerance 11/15/23 xray as below (?fungal ball)  Weight documentation per dr Jacqui Mau office 112 pound in 2014 --> 106 pound on 11/15/2023  No fever, chill, nightsweat Cough is usually dry Chronic fatigue  She has been on oxygen -- day 2-3 lpm; at night 3 lpm  Other  testings: 11/15/23 quantiferon gold negative 11/15/23 fungitel negative 11/22/23 aspergillus antibody negative  Today 12/14/23 has some production to her cough She was started on prednisone  and antibiotics from dr [facility] office. She was given a zpack. She takes a prednisone  taper from 40 mg short course. She feels a little better since prednisone . Last time she was on prednisone  was 2 years ago.  No recent travel/sick contact  With the increased cough, no associated rash/joint pain   She was taken voriconazole  but hasn't taken it yet   Prior travel: [place]en [place]  Review of Systems: ROS All other ros negative       Past Medical History:  Diagnosis Date   Acute liver failure    Anemia    Anorexia nervosa    h/o    CAP (community acquired pneumonia)    Cardiomyopathy (HCC)    CHF (congestive heart failure) (HCC)    Difficult intubation    secondary to jaw muscles   Fibromyalgia    Fibromyalgia syndrome    GERD (gastroesophageal reflux disease)    IBS (irritable bowel syndrome)    MS (multiple sclerosis) (HCC)    PTSD (post-traumatic stress disorder)    Sacral decubitus ulcer    Shock circulatory (HCC)    TMJ disease     Social History   Tobacco Use   Smoking status: Former    Current packs/day: 0.00    Average packs/day: 1 pack/day for 11.0 years (11.0 ttl pk-yrs)    Types: Cigarettes    Start date: 72    Quit date: 07/07/1987    Years since quitting: 61.4  Smokeless tobacco: Never  Vaping Use   Vaping status: Never Used  Substance Use Topics   Alcohol use: No   Drug use: No    Family History  Problem Relation Age of Onset   Asthma Mother    Uterine cancer Mother    Skin cancer Mother    Asthma Father    Cancer Father        ? type   Asthma Brother    Asthma Sister    Heart disease Maternal Uncle    Heart disease Maternal Grandfather    Thyroid  cancer Brother    Multiple sclerosis Brother    Multiple sclerosis Other    Breast  cancer Maternal Aunt     Allergies  Allergen Reactions   Scopolamine Anaphylaxis and Shortness Of Breath    Respiratory distress   Duloxetine Hcl Other (See Comments)   Ivp Dye [Iodinated Contrast Media] Other (See Comments)    unknown   Levaquin  [Levofloxacin ] Other (See Comments)    Numbness hands and feet   Lisinopril      SOB, cough   Other Other (See Comments)   Carvedilol  Rash    OBJECTIVE: Vitals:   12/14/23 1449  BP: 107/64  Pulse: 78  Resp: 16  SpO2: 98%  Weight: 111 lb 3.2 oz (50.4 kg)  Height: 5' 4.5" (1.638 m)   Body mass index is 18.79 kg/m.   Physical Exam General/constitutional: no distress, pleasant HEENT: Normocephalic, PER, Conj Clear, EOMI, Oropharynx clear Neck supple CV: rrr no mrg Lungs: clear to auscultation, normal respiratory effort Abd: Soft, Nontender Ext: no edema Skin: No Rash Neuro: nonfocal MSK: no peripheral joint swelling/tenderness/warmth; back spines nontender   Lab: Lab Results  Component Value Date   WBC 5.1 11/15/2023   HGB 11.8 (L) 11/15/2023   HCT 36.1 11/15/2023   MCV 87.6 11/15/2023   PLT 238.0 11/15/2023   Last metabolic panel Lab Results  Component Value Date   GLUCOSE 86 11/15/2023   NA 138 11/15/2023   K 4.0 11/15/2023   CL 100 11/15/2023   CO2 30 11/15/2023   BUN 14 11/15/2023   CREATININE 0.50 11/15/2023   GFR 101.73 11/15/2023   CALCIUM 9.9 11/15/2023   PHOS 3.1 06/21/2012   PROT 7.2 03/30/2018   ALBUMIN  4.4 03/30/2018   BILITOT 0.3 03/30/2018   ALKPHOS 66 03/30/2018   AST 15 03/30/2018   ALT 20 03/30/2018   ANIONGAP 9 11/02/2022    Microbiology: 06/2012 respiratory cx Pseudomonas, abundant gpc in clusters not worked up, serratia  Serology:  Imaging: Reviewed   5/12 cxr worsening reticulonodular opacities in the lower lungs and the appearance of fungus ball at the left apex.    4/24 ct chest 1. New multifocal areas of peripheral mucous plugging, nodules and ground-glass  opacities throughout both lungs lower lungs. Some of these nodular densities are calcified. Findings are favored as infectious/inflammatory. Differential considerations include atypical infection such as fungal or mycobacterial, aspiration, or sarcoidosis. 2. Thick walled bulla or cavitary lesion in the left lung apex measuring 3.6 x 1.5 x 1.9 cm. This is predominantly air-filled, but has some internal septations and nodularity. No air-fluid level. Findings may be infectious/inflammatory, but neoplasm not excluded. 3. 12 mm noncalcified nodular density in the medial left upper lobe is indeterminate    Assessment/plan: Problem List Items Addressed This Visit     Cavitary lung disease   Relevant Orders   CBC w/Diff   Cryptococcal Ag, Ltx Scr Rflx Titer   Histoplasma  Antigen, Urine   Blastomyces Ab, ID   IgE   Other/Misc lab test   CT CHEST WO CONTRAST   Coccidioides Antibodies   Other Visit Diagnoses       Abnormal imaging of intrathoracic organ    -  Primary     Pulmonary nodules             Patient has chest ct 10/2023 that showed upper lobe thickwall cavities bilaterally with what appears to be fungal ball left lobe cavity and also diffuse bilateral more toward the base nodules changes  Chronic decline in respiratory function and perhaps more cough/dyspnea the last few days before my visit today  No fever, chill No real weightloss Appetite is chronically poor  On 2-3 liters oxygen stable  Don't have chest ct the past few years so not sure how long she has had these changes  Former smoker  No sign of bronchiectasis on chest ct though  No known hx of cancer else where   My ddx more so infectious. There might be a nodule that is more prominent that might be cancerous but nothing obviously jumped out based on 10/2023 ct and will defer to pulm to comment on that   What I would like to r/o is fungal process or NTM. No tb exposure. As she can't produce sputum,  would be good to get bronch for afb/fungal and see if pulm could do a wang biopsy on one of the nodules vs ir referral for that if they feel needed   In the mean time I will check ige/igg panel for aspergillus and get endemic fungal serology. She has been to cocci/histo/blasto land   I wouldn't limit this just to aspergillus. And if it was the case of just fungal ball medication might not help   Her symptoms of the past few days including more cough/dyspnea might not even be related to chest ct finding in 10/2023   -serology for fungal as mentioned; aspergillus igg/ige panel; ige level; eosinophils testing -repeat chest ct in around the next 2-4 weeks -f/u with dr Aurea Blossom in July as scheduled; reassure patient that it is ok to have it then and not emergent -no sputum production so will need bronch for Afb, fungal, and aerobic culture -finish prednisone  course and azithromycin  -f/u with me in 2 weeks for symptoms check   -chart forwarded to dr [name] and Dr Ted Favor      Follow-up: Return in about 2 weeks (around 12/28/2023).  Jamesetta Mcbride, MD [facility] 12/14/2023, 3:06 PM

## 2023-12-14 NOTE — Patient Instructions (Addendum)
 The main point I want you to know today is that your current few days of symptoms might not be related to chest ct finding   I don't know how long you have the chest ct finding   With the chest ct finding I do want to see if it could be some chronic fungal process that might have died out or just live in the cavities and not doing anything to you systemically. There is also tb cousin that could do this. As you can't cough sputum, will need bronchoscopy for culture.    Blood work Repeat chest ct   Finish your current copd exacerbation tx  See me in 2-3 weeks

## 2023-12-16 ENCOUNTER — Ambulatory Visit: Admitting: Internal Medicine

## 2023-12-20 ENCOUNTER — Ambulatory Visit

## 2023-12-20 DIAGNOSIS — J984 Other disorders of lung: Secondary | ICD-10-CM

## 2023-12-23 ENCOUNTER — Ambulatory Visit: Payer: Self-pay | Admitting: Internal Medicine

## 2023-12-23 LAB — CBC WITH DIFFERENTIAL/PLATELET
Absolute Lymphocytes: 382 {cells}/uL — ABNORMAL LOW (ref 850–3900)
Absolute Monocytes: 143 {cells}/uL — ABNORMAL LOW (ref 200–950)
Basophils Absolute: 0 {cells}/uL (ref 0–200)
Basophils Relative: 0 %
Eosinophils Absolute: 0 {cells}/uL — ABNORMAL LOW (ref 15–500)
Eosinophils Relative: 0 %
HCT: 37.3 % (ref 35.0–45.0)
Hemoglobin: 12.1 g/dL (ref 11.7–15.5)
MCH: 28.3 pg (ref 27.0–33.0)
MCHC: 32.4 g/dL (ref 32.0–36.0)
MCV: 87.4 fL (ref 80.0–100.0)
MPV: 11.7 fL (ref 7.5–12.5)
Monocytes Relative: 2.5 %
Neutro Abs: 5176 {cells}/uL (ref 1500–7800)
Neutrophils Relative %: 90.8 %
Platelets: 256 10*3/uL (ref 140–400)
RBC: 4.27 10*6/uL (ref 3.80–5.10)
RDW: 13.2 % (ref 11.0–15.0)
Total Lymphocyte: 6.7 %
WBC: 5.7 10*3/uL (ref 3.8–10.8)

## 2023-12-23 LAB — CRYPTOCOCCAL AG, LTX SCR RFLX TITER
Cryptococcal Ag Screen: NOT DETECTED
MICRO NUMBER:: 16563151
SPECIMEN QUALITY:: ADEQUATE

## 2023-12-23 LAB — ASPERGILLUS ANTIBODY BY IMMUNODIFF: Aspergillus Antibody ID: NEGATIVE

## 2023-12-23 LAB — IGE: IgE (Immunoglobulin E), Serum: 522 kU/L — ABNORMAL HIGH (ref <=114)

## 2023-12-23 LAB — COCCIDIOIDES ANTIBODIES: COCCIDIOIDES AB, CF, SERUM: <1:2 {titer}

## 2023-12-23 LAB — ASPERGILLUS FUMIGATUS (IGG) ANTIBODY: Aspergillus Fumigatus,IgG AB, Serum: 107 mg/L — ABNORMAL HIGH (ref <=102)

## 2023-12-23 LAB — BLASTOMYCES AB, ID: Blastomyces Abs, Qn, DID: NEGATIVE

## 2023-12-27 ENCOUNTER — Ambulatory Visit: Payer: Self-pay

## 2023-12-27 NOTE — Telephone Encounter (Signed)
 FYI Only or Action Required?: FYI only for provider.  Patient is followed in Pulmonology for COPD, last seen on 11/15/2023 by Darlean Ozell NOVAK, MD. Called Nurse Triage reporting Aspiration. Symptoms began several weeks ago. Interventions attempted: OTC medications: omeprazole  and Maintenance inhaler. Symptoms are: unchanged.  Triage Disposition: See Physician Within 24 Hours  Patient/caregiver understands and will follow disposition?: Yes       Copied from CRM [id number]. Topic: Clinical - Red Word Triage >> Dec 27, 2023 10:05 AM Benton O wrote: [facility] that prompted transfer to Nurse Triage: gurd keep coughing and aspirating in my lung need to find out how to increase my gurd medicine to stop aspirating into my lungs Reason for Disposition  SEVERE coughing spells (e.g., whooping sound after coughing, vomiting after coughing)  Answer Assessment - Initial Assessment Questions E2C2 Pulmonary Triage - Initial Assessment Questions Chief Complaint (e.g., cough, sob, wheezing, fever, chills, sweat or additional symptoms) *Go to specific symptom protocol after initial questions. Coughing - ate something that caused flare in GERD - modified diet with omeprazole  40 mg twice daily Endorses heartburn with drinking water   How long have symptoms been present? 2 weeks - worsening past few weeks  Have you tested for COVID or Flu? Note: If not, ask patient if a home test can be taken. If so, instruct patient to call back for positive results. No  MEDICINES:   Have you used any OTC meds to help with symptoms? Yes If yes, ask What medications? omeprazole   Have you used your inhalers/maintenance medication? Yes If yes, What medications? Dulera  2 puffs once a day - gets GERD if twice day Spiriva  Respimat - 2 puffs daily Xopenex  PRN - last used last week with no relief  If inhaler, ask How many puffs and how often? Note: Review instructions on medication in the chart. See  above  OXYGEN: Do you wear supplemental oxygen? Yes If yes, How many liters are you supposed to use? 3L  Do you monitor your oxygen levels? Yes If yes, What is your reading (oxygen level) today? 94  What is your usual oxygen saturation reading?  (Note: Pulmonary O2 sats should be 90% or greater) 98    1. ONSET: When did the cough begin?      See above 2. SEVERITY: How bad is the cough today?      See above 3. SPUTUM: Describe the color of your sputum (none, dry cough; clear, white, yellow, green)     clear 4. HEMOPTYSIS: Are you coughing up any blood? If so ask: How much? (flecks, streaks, tablespoons, etc.)     denies 5. DIFFICULTY BREATHING: Are you having difficulty breathing? If Yes, ask: How bad is it? (e.g., mild, moderate, severe)    - MILD: No SOB at rest, mild SOB with walking, speaks normally in sentences, can lie down, no retractions, pulse < 100.    - MODERATE: SOB at rest, SOB with minimal exertion and prefers to sit, cannot lie down flat, speaks in phrases, mild retractions, audible wheezing, pulse 100-120.    - SEVERE: Very SOB at rest, speaks in single words, struggling to breathe, sitting hunched forward, retractions, pulse > 120      Mild Triager does not appreciate audible SOB/wheezing during call. Pt is speaking in full sentences.  6. FEVER: Do you have a fever? If Yes, ask: What is your temperature, how was it measured, and when did it start?     denies 7. CARDIAC HISTORY: Do you have  any history of heart disease? (e.g., heart attack, congestive heart failure)      CHF per chart 8. LUNG HISTORY: Do you have any history of lung disease?  (e.g., pulmonary embolus, asthma, emphysema)     Cavitary lung disease Copd  9. PE RISK FACTORS: Do you have a history of blood clots? (or: recent major surgery, recent prolonged travel, bedridden)     denies 10. OTHER SYMPTOMS: Do you have any other symptoms? (e.g., runny nose, wheezing,  chest pain)       denies 11. PREGNANCY: Is there any chance you are pregnant? When was your last menstrual period?       N/a 12. TRAVEL: Have you traveled out of the country in the last month? (e.g., travel history, exposures)       N/a  Protocols used: Cough - Acute Non-Productive-A-AH

## 2023-12-28 ENCOUNTER — Ambulatory Visit: Admitting: Internal Medicine

## 2023-12-28 ENCOUNTER — Ambulatory Visit: Admitting: Pulmonary Disease

## 2023-12-28 ENCOUNTER — Telehealth: Admitting: Pulmonary Disease

## 2023-12-28 DIAGNOSIS — R918 Other nonspecific abnormal finding of lung field: Secondary | ICD-10-CM

## 2023-12-28 DIAGNOSIS — J449 Chronic obstructive pulmonary disease, unspecified: Secondary | ICD-10-CM | POA: Diagnosis not present

## 2023-12-28 DIAGNOSIS — J984 Other disorders of lung: Secondary | ICD-10-CM

## 2023-12-28 DIAGNOSIS — K219 Gastro-esophageal reflux disease without esophagitis: Secondary | ICD-10-CM | POA: Diagnosis not present

## 2023-12-28 MED ORDER — FAMOTIDINE 20 MG PO TABS
20.0000 mg | ORAL_TABLET | Freq: Two times a day (BID) | ORAL | Status: DC
Start: 2023-12-28 — End: 2024-01-25

## 2023-12-28 MED ORDER — DEXLANSOPRAZOLE 60 MG PO CPDR: 60.0000 mg | DELAYED_RELEASE_CAPSULE | Freq: Every day | ORAL | 0 refills | Status: DC

## 2023-12-28 MED ORDER — CEFPODOXIME PROXETIL 200 MG PO TABS: 200.0000 mg | ORAL_TABLET | Freq: Two times a day (BID) | ORAL | 0 refills | Status: AC

## 2023-12-28 NOTE — Progress Notes (Addendum)
 [name]    [id number]    1960-11-03  Primary Care Physician:McNeill, Sari, MD  Referring Physician: Aisha Sari, MD [address] [place],  [place] [address]  Virtual Visit via Video Note  I connected with [name] on 12/28/23 at  1:45 PM EDT by a video enabled telemedicine application and verified that I am speaking with the correct person using two identifiers.  Location: Patient: Home Provider: Pulmonary office   I discussed the limitations of evaluation and management by telemedicine and the availability of in person appointments. The patient expressed understanding and agreed to proceed.   Chief complaint: Acute visit for cough, GERD  HPI: 63 y.o. who  has a past medical history of Acute liver failure, Anemia, Anorexia nervosa, CAP (community acquired pneumonia), Cardiomyopathy (HCC), CHF (congestive heart failure) (HCC), Difficult intubation, Fibromyalgia, Fibromyalgia syndrome, GERD (gastroesophageal reflux disease), IBS (irritable bowel syndrome), MS (multiple sclerosis) (HCC), PTSD (post-traumatic stress disorder), Sacral decubitus ulcer, Shock circulatory (HCC), and TMJ disease.  Discussed the use of AI scribe software for clinical note transcription with the patient, who gave verbal consent to proceed.  History of Present Illness [name] is a 63 year old female with a cavitary lung lesion, nodular opacities, COPD and GERD who presents with worsening GERD symptoms, cough and aspiration.  She is a patient of Dr. Darlean  She has been experiencing persistent cough and worsening GERD symptoms for the past two weeks after consuming fatty foods that are not GERD-friendly. Her usual medication, omeprazole  40 mg twice daily, which has kept her stable for years, is no longer effective. She experiences constant heartburn and GERD symptoms that are higher in her throat than usual, causing significant discomfort  during activities such as bending, talking, and eating. She has been aspirating into her lungs daily, particularly at night, despite sleeping at a 90-degree angle. She has increased her omeprazole  dose by taking an extra pill at night, but it has not alleviated her symptoms.  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 She is scheduled to see Dr. Annella next month for evaluation of bronchoscopy and is currently under the care of Dr. Overton at the infectious disease clinic. She experienced odd fatigue and loss of consciousness in April, leading to the discovery of the lung abnormality during a CT scan of her head. She is awaiting results from a CT scan done a week ago.  She has a long-standing history of dysphagia and a jaw problem, which has required her to eat a soft diet, often consisting of pureed foods. Despite these issues, she had no swallowing problems until the recent dietary indiscretion. She has been managing her COPD with prednisone  and antibiotics as needed, but recent prednisone  use worsened her cough.  Outpatient Encounter Medications as of 12/28/2023  Medication Sig   acetaminophen  (TYLENOL ) 325 MG tablet Take 975 mg by mouth in the morning.   azithromycin  (ZITHROMAX ) 250 MG tablet Take 1 tablet (250 mg total) by mouth as needed (for copd flare up).   bisoprolol  (ZEBETA ) 5 MG tablet TAKE 1/2 TABLET BY MOUTH DAILY   cholecalciferol  (VITAMIN D3) 25 MCG (1000 UNIT) tablet Take 1,000 Units by mouth daily. Takes 2 tabs to equal 2000 daily.   irbesartan  (AVAPRO ) 75 MG tablet TAKE ONE TABLET BY MOUTH ONCE DAILY  levalbuterol  (XOPENEX  HFA) 45 MCG/ACT inhaler Inhale up to 2 puffs every 4 hours as needed for shortness of breath or wheezing.   Magnesium  250 MG TABS Take 250 mg by mouth daily.   mometasone -formoterol  (DULERA ) 100-5 MCG/ACT AERO INHALE 2 PUFFS BY  MOUTH FIRST THING IN THE MORNING AND 2 PUFFS ABOUT 12 HOURS LATER   Multiple Vitamin (MULTIVITAMIN) capsule Take 1 capsule by mouth daily.   omeprazole  (PRILOSEC) 20 MG capsule Take 40 mg by mouth 2 (two) times daily before a meal.   OXYGEN 2 L at bedtime. Patient wear oxygen Day and Night 2-3 Liters.   predniSONE  (DELTASONE ) 10 MG tablet Take 1 tablet (10 mg total) by mouth as needed (for copd flare up).   Tiotropium Bromide  Monohydrate (SPIRIVA  RESPIMAT) 2.5 MCG/ACT AERS Inhale 2 puffs into the lungs daily.   voriconazole  (VFEND ) 40 MG/ML suspension Take 5 mLs (200 mg total) by mouth 2 (two) times daily. (Patient not taking: Reported on 12/14/2023)   No facility-administered encounter medications on file as of 12/28/2023.   Physical Exam: Tele visit  Data Reviewed: Imaging:  CT chest 10/28/2023-thick-walled cavitary lesion in the left lung apex with internal septation and nodularity, new multifocal areas of mucous plugging, nodules and groundglass with lower lung predominance.  CT chest 12/20/2023-prelim read by me shows persistent thick-walled cavity in the left lung apex with worsening areas of nodules and groundglass opacities in the lower lungs.  I have reviewed the images personally.  PFTs: 08/01/2013-significant obstruction  Labs:  Assessment & Plan Gastroesophageal reflux disease (GERD) with aspiration Acute exacerbation of GERD with significant aspiration symptoms, likely triggered by dietary indiscretion. Current treatment with omeprazole  40 mg twice daily is ineffective. Symptoms include persistent cough, heartburn, and aspiration, particularly at night, despite sleeping at a 90-degree angle. Recent dietary changes have not alleviated symptoms. - Discontinue omeprazole . - Prescribe Dexilant  (dexlansoprazole ) to replace omeprazole . - Add Pepcid  (famotidine ) at night. - Advise use of over-the-counter Tums as needed throughout the day.  Cavitary lesion, lung nodules Lung  nodules with inflammatory and nodular changes, including a cavity change in the left upper lobe, identified on CT scan. Recent CT scan shows worsening opacities in the lower lungs, possibly related to aspiration. Differential includes chronic infection such as atypical mycobacteria, fungal infection such as Aspergillus, malignancy.  She does have some aspiration issues this month but CT scan findings in April 2025 have preceded her acute presentation. - Empirically treat with antibiotics.  Prescribed azithromycin , cefpodoxime .  She is intolerant of most other antibiotics - Order PET scan after antibiotics for reevaluation - Refer to a nodule clinic for a navigational biopsy and bronchoalveolar lavage  Chronic obstructive pulmonary disease (COPD) On Spiriva , Dulera , Xopenex  as needed  Recommendations: Dexilant , Pepcid , Tums Antibiotic treatment with azithromycin , cefpodoxime  Follow-up PET scan Evaluate for navigational biopsy and BAL  I discussed the assessment and treatment plan with the patient. The patient was provided an opportunity to ask questions and all were answered. The patient agreed with the plan and demonstrated an understanding of the instructions.   The patient was advised to call back or seek an in-person evaluation if the symptoms worsen or if the condition fails to improve as anticipated.  Lonna Coder MD  Pulmonary and Critical Care 12/28/2023, 12:10 PM  CC: Aisha Harvey, MD

## 2023-12-28 NOTE — H&P (View-Only) (Signed)
 [name]    [id number]    1960-11-03  Primary Care Physician:McNeill, Sari, MD  Referring Physician: Aisha Sari, MD [address] [place],  [place] [address]  Virtual Visit via Video Note  I connected with [name] on 12/28/23 at  1:45 PM EDT by a video enabled telemedicine application and verified that I am speaking with the correct person using two identifiers.  Location: Patient: Home Provider: Pulmonary office   I discussed the limitations of evaluation and management by telemedicine and the availability of in person appointments. The patient expressed understanding and agreed to proceed.   Chief complaint: Acute visit for cough, GERD  HPI: 63 y.o. who  has a past medical history of Acute liver failure, Anemia, Anorexia nervosa, CAP (community acquired pneumonia), Cardiomyopathy (HCC), CHF (congestive heart failure) (HCC), Difficult intubation, Fibromyalgia, Fibromyalgia syndrome, GERD (gastroesophageal reflux disease), IBS (irritable bowel syndrome), MS (multiple sclerosis) (HCC), PTSD (post-traumatic stress disorder), Sacral decubitus ulcer, Shock circulatory (HCC), and TMJ disease.  Discussed the use of AI scribe software for clinical note transcription with the patient, who gave verbal consent to proceed.  History of Present Illness [name] is a 63 year old female with a cavitary lung lesion, nodular opacities, COPD and GERD who presents with worsening GERD symptoms, cough and aspiration.  She is a patient of Dr. Darlean  She has been experiencing persistent cough and worsening GERD symptoms for the past two weeks after consuming fatty foods that are not GERD-friendly. Her usual medication, omeprazole  40 mg twice daily, which has kept her stable for years, is no longer effective. She experiences constant heartburn and GERD symptoms that are higher in her throat than usual, causing significant discomfort  during activities such as bending, talking, and eating. She has been aspirating into her lungs daily, particularly at night, despite sleeping at a 90-degree angle. She has increased her omeprazole  dose by taking an extra pill at night, but it has not alleviated her symptoms.  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 She is scheduled to see Dr. Annella next month for evaluation of bronchoscopy and is currently under the care of Dr. Overton at the infectious disease clinic. She experienced odd fatigue and loss of consciousness in April, leading to the discovery of the lung abnormality during a CT scan of her head. She is awaiting results from a CT scan done a week ago.  She has a long-standing history of dysphagia and a jaw problem, which has required her to eat a soft diet, often consisting of pureed foods. Despite these issues, she had no swallowing problems until the recent dietary indiscretion. She has been managing her COPD with prednisone  and antibiotics as needed, but recent prednisone  use worsened her cough.  Outpatient Encounter Medications as of 12/28/2023  Medication Sig   acetaminophen  (TYLENOL ) 325 MG tablet Take 975 mg by mouth in the morning.   azithromycin  (ZITHROMAX ) 250 MG tablet Take 1 tablet (250 mg total) by mouth as needed (for copd flare up).   bisoprolol  (ZEBETA ) 5 MG tablet TAKE 1/2 TABLET BY MOUTH DAILY   cholecalciferol  (VITAMIN D3) 25 MCG (1000 UNIT) tablet Take 1,000 Units by mouth daily. Takes 2 tabs to equal 2000 daily.   irbesartan  (AVAPRO ) 75 MG tablet TAKE ONE TABLET BY MOUTH ONCE DAILY  levalbuterol  (XOPENEX  HFA) 45 MCG/ACT inhaler Inhale up to 2 puffs every 4 hours as needed for shortness of breath or wheezing.   Magnesium  250 MG TABS Take 250 mg by mouth daily.   mometasone -formoterol  (DULERA ) 100-5 MCG/ACT AERO INHALE 2 PUFFS BY  MOUTH FIRST THING IN THE MORNING AND 2 PUFFS ABOUT 12 HOURS LATER   Multiple Vitamin (MULTIVITAMIN) capsule Take 1 capsule by mouth daily.   omeprazole  (PRILOSEC) 20 MG capsule Take 40 mg by mouth 2 (two) times daily before a meal.   OXYGEN 2 L at bedtime. Patient wear oxygen Day and Night 2-3 Liters.   predniSONE  (DELTASONE ) 10 MG tablet Take 1 tablet (10 mg total) by mouth as needed (for copd flare up).   Tiotropium Bromide  Monohydrate (SPIRIVA  RESPIMAT) 2.5 MCG/ACT AERS Inhale 2 puffs into the lungs daily.   voriconazole  (VFEND ) 40 MG/ML suspension Take 5 mLs (200 mg total) by mouth 2 (two) times daily. (Patient not taking: Reported on 12/14/2023)   No facility-administered encounter medications on file as of 12/28/2023.   Physical Exam: Tele visit  Data Reviewed: Imaging:  CT chest 10/28/2023-thick-walled cavitary lesion in the left lung apex with internal septation and nodularity, new multifocal areas of mucous plugging, nodules and groundglass with lower lung predominance.  CT chest 12/20/2023-prelim read by me shows persistent thick-walled cavity in the left lung apex with worsening areas of nodules and groundglass opacities in the lower lungs.  I have reviewed the images personally.  PFTs: 08/01/2013-significant obstruction  Labs:  Assessment & Plan Gastroesophageal reflux disease (GERD) with aspiration Acute exacerbation of GERD with significant aspiration symptoms, likely triggered by dietary indiscretion. Current treatment with omeprazole  40 mg twice daily is ineffective. Symptoms include persistent cough, heartburn, and aspiration, particularly at night, despite sleeping at a 90-degree angle. Recent dietary changes have not alleviated symptoms. - Discontinue omeprazole . - Prescribe Dexilant  (dexlansoprazole ) to replace omeprazole . - Add Pepcid  (famotidine ) at night. - Advise use of over-the-counter Tums as needed throughout the day.  Cavitary lesion, lung nodules Lung  nodules with inflammatory and nodular changes, including a cavity change in the left upper lobe, identified on CT scan. Recent CT scan shows worsening opacities in the lower lungs, possibly related to aspiration. Differential includes chronic infection such as atypical mycobacteria, fungal infection such as Aspergillus, malignancy.  She does have some aspiration issues this month but CT scan findings in April 2025 have preceded her acute presentation. - Empirically treat with antibiotics.  Prescribed azithromycin , cefpodoxime .  She is intolerant of most other antibiotics - Order PET scan after antibiotics for reevaluation - Refer to a nodule clinic for a navigational biopsy and bronchoalveolar lavage  Chronic obstructive pulmonary disease (COPD) On Spiriva , Dulera , Xopenex  as needed  Recommendations: Dexilant , Pepcid , Tums Antibiotic treatment with azithromycin , cefpodoxime  Follow-up PET scan Evaluate for navigational biopsy and BAL  I discussed the assessment and treatment plan with the patient. The patient was provided an opportunity to ask questions and all were answered. The patient agreed with the plan and demonstrated an understanding of the instructions.   The patient was advised to call back or seek an in-person evaluation if the symptoms worsen or if the condition fails to improve as anticipated.  Lonna Coder MD  Pulmonary and Critical Care 12/28/2023, 12:10 PM  CC: Aisha Harvey, MD

## 2023-12-28 NOTE — Patient Instructions (Signed)
 VISIT SUMMARY:  During your visit, we discussed your worsening GERD symptoms and aspiration issues, as well as your ongoing lung mass investigation. We reviewed your current medications and made some changes to better manage your symptoms. We also discussed your COPD management and long-standing dysphagia.  YOUR PLAN:  -GASTROESOPHAGEAL REFLUX DISEASE (GERD) WITH ASPIRATION: GERD is a condition where stomach acid frequently flows back into the tube connecting your mouth and stomach, causing irritation. Your GERD symptoms have worsened, leading to significant discomfort and aspiration into your lungs. We will discontinue omeprazole  and start you on Dexilant (dexlansoprazole). Additionally, you will take Pepcid (famotidine) at night and use over-the-counter Tums as needed throughout the day.  -LUNG NODULES WITH POSSIBLE MALIGNANCY: Lung nodules are abnormal growths in the lungs that can be benign or malignant. Your recent CT scan showed worsening opacities, possibly due to aspiration. We will continue your azithromycin  and add a cephalosporin antibiotic. You will be referred to a nodule clinic for a navigational biopsy to further assess the nodules.  -CHRONIC OBSTRUCTIVE PULMONARY DISEASE (COPD): COPD is a chronic inflammatory lung disease that obstructs airflow from the lungs. Your COPD is not well-managed, and you use prednisone  and antibiotics as needed for flare-ups. We will continue with your current management plan.  -DYSPHAGIA: Dysphagia is difficulty swallowing. You have a long-standing history of this condition due to jaw problems and rely on a soft diet. There have been no recent changes in your swallowing function.  INSTRUCTIONS:  Your prescriptions will be sent to [facility] pharmacy. We will notify you of your appointment for the navigational biopsy.

## 2023-12-29 ENCOUNTER — Telehealth: Payer: Self-pay

## 2023-12-29 ENCOUNTER — Telehealth (HOSPITAL_COMMUNITY): Payer: Self-pay | Admitting: Cardiology

## 2023-12-29 ENCOUNTER — Ambulatory Visit (HOSPITAL_COMMUNITY)

## 2023-12-29 NOTE — Telephone Encounter (Signed)
 Patient called to report she was seen by pulm 6/19. With the pulmonary concerns discussed at OV with Dr Rolan, pt request review of chest CT.  Would like to know if results require change in plans from cardiac standpoint. Aware results noted fluid around heart and is concerned

## 2023-12-29 NOTE — Telephone Encounter (Signed)
 PA request has been Received. New Encounter has been or will be created for follow up. For additional info see Pharmacy Prior Auth telephone encounter from 06/25.

## 2023-12-29 NOTE — Telephone Encounter (Signed)
 The requested medication and/or diagnosis are not a covered benefit and excluded from coverage in  accordance with the terms and conditions of your plan benefit. Therefore, the request has been  administratively denied.

## 2023-12-29 NOTE — Telephone Encounter (Signed)
 Spoke to patient. She stated that Dexilant requires a PA.   PA team, please advise. Thanks

## 2023-12-29 NOTE — Telephone Encounter (Signed)
 Pt is aware of below message. She stated that she would purchase the medication OTC as recommended by [facility] pharmacy.  Nothing further needed.

## 2023-12-29 NOTE — Telephone Encounter (Signed)
*  Pulm  Pharmacy Patient Advocate Encounter   Received notification from Pt Calls Messages that prior authorization for Dexlansoprazole 60MG  dr capsules  is required/requested.   Insurance verification completed.   The patient is insured through [facility] .   Per test claim: PA required; PA submitted to above mentioned insurance via CoverMyMeds Key/confirmation #/[id number] [facility] Status is pending

## 2023-12-30 MED ORDER — ROSUVASTATIN CALCIUM 10 MG PO TABS: 10.0000 mg | ORAL_TABLET | Freq: Every day | ORAL | 3 refills | Status: DC

## 2023-12-30 NOTE — Telephone Encounter (Signed)
 Pt aware and voiced understanding

## 2024-01-06 ENCOUNTER — Encounter: Payer: Self-pay | Admitting: Internal Medicine

## 2024-01-06 ENCOUNTER — Ambulatory Visit (INDEPENDENT_AMBULATORY_CARE_PROVIDER_SITE_OTHER): Admitting: Internal Medicine

## 2024-01-06 ENCOUNTER — Other Ambulatory Visit: Payer: Self-pay

## 2024-01-06 VITALS — BP 96/45 | HR 81 | Temp 97.5°F | Wt 107.4 lb

## 2024-01-06 DIAGNOSIS — R9389 Abnormal findings on diagnostic imaging of other specified body structures: Secondary | ICD-10-CM

## 2024-01-06 NOTE — Progress Notes (Signed)
 [facility]  Reason for Consult:cavitary lung disease Referring Provider: Ozell America (pulmonology)    Patient Active Problem List   Diagnosis Date Noted   Cavitary lung disease 11/16/2023   COPD GOLD 0 and refuses further testing 05/28/2016   Upper airway cough syndrome 08/19/2015   Dysphagia, pharyngoesophageal phase 10/29/2014   Left-sided chest wall pain 05/07/2014   DOE (dyspnea on exertion) 08/18/2013   Palpitations 07/18/2013   Chronic systolic CHF (congestive heart failure) (HCC) 08/30/2012   Tracheostomy status (HCC) 07/07/2012   Physical deconditioning 07/07/2012   Dilated cardiomyopathy (HCC) 06/09/2012   Malnutrition compromising bodily function (HCC) 06/09/2012   H/O anorexia nervosa 06/09/2012   Lower urinary tract infectious disease 06/08/2012   Acute encephalopathy, resolved 06/07/2012   Anemia 06/07/2012   Thrombocytopenia (HCC) 06/07/2012   Coagulopathy (HCC) 06/07/2012   Acute liver failure 06/07/2012   Neuromuscular disorder: not fully defined.  06/07/2012      HPI: [name] is a 63 y.o. female referred to id for cxr suggestion of fungal ball   Reviewed chart Last seen pulm dr America 11/15/2023 Former smoker quit 1989 ?hx MS but patient denied   Progressive health decline since 2014 Dry cough chronic Can't lay flat Dyspnea on exertion No improvement with inhaler -- maintained on spiriva /dulera . Prednisone  use in the past ?reflux and no improvement with GERD tx   03/2023 GOLD 0 PFT's 10/2023 abnormal chest ct bilateral nodules and cavitary changes 11/15/23 gold 0 pft's Continued progression of respiratory sx/activity intolerance 11/15/23 xray as below (?fungal ball)  Weight documentation per dr Chari office 112 pound in 2014 --> 106 pound on 11/15/2023  No fever, chill, nightsweat Cough is usually dry Chronic fatigue  She has been on oxygen -- day 2-3 lpm; at night 3 lpm  Other  testings: 11/15/23 quantiferon gold negative 11/15/23 fungitel negative 11/22/23 aspergillus antibody negative  Today 12/14/23 has some production to her cough She was started on prednisone  and antibiotics from dr [facility] office. She was given a zpack. She takes a prednisone  taper from 40 mg short course. She feels a little better since prednisone . Last time she was on prednisone  was 2 years ago.  No recent travel/sick contact  With the increased cough, no associated rash/joint pain   She was taken voriconazole  but hasn't taken it yet   Prior travel: [place]   ----------------- 01/06/24 id clinic f/u She was on prednisone /azith when she saw me last office visit Was having lots of upper gi discomfort and nausea She had since changed coated azithromycin ; was given cefpodoxim 7 days to take after azithromycin  course Prednisone  was finished  Today still has some reflux but better Cough better More energy No fever chill Appetite  Ct chest reviewed    Review of Systems: ROS All other ros negative       Past Medical History:  Diagnosis Date   Acute liver failure    Anemia    Anorexia nervosa    h/o    CAP (community acquired pneumonia)    Cardiomyopathy (HCC)    CHF (congestive heart failure) (HCC)    Difficult intubation    secondary to jaw muscles   Fibromyalgia    Fibromyalgia syndrome    GERD (gastroesophageal reflux disease)    IBS (irritable bowel syndrome)    MS (multiple sclerosis) (HCC)    PTSD (post-traumatic stress disorder)    Sacral decubitus ulcer  Shock circulatory (HCC)    TMJ disease     Social History   Tobacco Use   Smoking status: Former    Current packs/day: 0.00    Average packs/day: 1 pack/day for 11.0 years (11.0 ttl pk-yrs)    Types: Cigarettes    Start date: 87    Quit date: 07/07/1987    Years since quitting: 36.5   Smokeless tobacco: Never  Vaping Use   Vaping status: Never Used  Substance Use Topics    Alcohol use: No   Drug use: No    Family History  Problem Relation Age of Onset   Asthma Mother    Uterine cancer Mother    Skin cancer Mother    Asthma Father    Cancer Father        ? type   Asthma Brother    Asthma Sister    Heart disease Maternal Uncle    Heart disease Maternal Grandfather    Thyroid  cancer Brother    Multiple sclerosis Brother    Multiple sclerosis Other    Breast cancer Maternal Aunt     Allergies  Allergen Reactions   Scopolamine Anaphylaxis and Shortness Of Breath    Respiratory distress   Duloxetine Hcl Other (See Comments)   Ivp Dye [Iodinated Contrast Media] Other (See Comments)    unknown   Levaquin  [Levofloxacin ] Other (See Comments)    Numbness hands and feet   Lisinopril      SOB, cough   Other Other (See Comments)   Carvedilol  Rash    OBJECTIVE: Vitals:   01/06/24 1525  BP: (!) 96/45  Pulse: 81  Temp: (!) 97.5 F (36.4 C)  TempSrc: Temporal  SpO2: 98%  Weight: 107 lb 6.4 oz (48.7 kg)   Body mass index is 18.15 kg/m.   Physical Exam General/constitutional: no distress, pleasant HEENT: Normocephalic, PER, Conj Clear, EOMI, Oropharynx clear Neck supple CV: rrr no mrg Lungs: clear to auscultation, normal respiratory effort Abd: Soft, Nontender Ext: no edema Skin: No Rash Neuro: nonfocal MSK: no peripheral joint swelling/tenderness/warmth; back spines nontender   Lab: Lab Results  Component Value Date   WBC 5.7 12/14/2023   HGB 12.1 12/14/2023   HCT 37.3 12/14/2023   MCV 87.4 12/14/2023   PLT 256 12/14/2023   Last metabolic panel Lab Results  Component Value Date   GLUCOSE 86 11/15/2023   NA 138 11/15/2023   K 4.0 11/15/2023   CL 100 11/15/2023   CO2 30 11/15/2023   BUN 14 11/15/2023   CREATININE 0.50 11/15/2023   GFR 101.73 11/15/2023   CALCIUM  9.9 11/15/2023   PHOS 3.1 06/21/2012   PROT 7.2 03/30/2018   ALBUMIN  4.4 03/30/2018   BILITOT 0.3 03/30/2018   ALKPHOS 66 03/30/2018   AST 15 03/30/2018    ALT 20 03/30/2018   ANIONGAP 9 11/02/2022    Microbiology: 06/2012 respiratory cx Pseudomonas, abundant gpc in clusters not worked up, serratia  Serology:  Imaging: Reviewed   5/12 cxr worsening reticulonodular opacities in the lower lungs and the appearance of fungus ball at the left apex.    4/24 ct chest 1. New multifocal areas of peripheral mucous plugging, nodules and ground-glass opacities throughout both lungs lower lungs. Some of these nodular densities are calcified. Findings are favored as infectious/inflammatory. Differential considerations include atypical infection such as fungal or mycobacterial, aspiration, or sarcoidosis. 2. Thick walled bulla or cavitary lesion in the left lung apex measuring 3.6 x 1.5 x 1.9 cm. This is predominantly  air-filled, but has some internal septations and nodularity. No air-fluid level. Findings may be infectious/inflammatory, but neoplasm not excluded. 3. 12 mm noncalcified nodular density in the medial left upper lobe is indeterminate   12/20/23 chest ct 1. The mostly thin walled cavity in the more cephalad left upper lobe apex does measure slightly larger today, 4.6 x 3.0 x 2.6 cm compared with 4.2 x 2.6 x 2.5 cm previously. The solid component in the inferior aspect of the cavity is unchanged in size at 2.1 x 1.7 cm, with calcification within the solid component. 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 2. The 10 x 7 mm nodule being followed in the medial left lung apex is stable. 3. Other multifocal branching nodularity in the lingula, right middle and both lower lobes, consistent with multifocal subsegmental airway impactions with bronchiectasis, is not notably changed in the interval since the last exam but this was not seen in 2013. 4. Some of the nodules are not confirmed as branching, for example a new 7 mm right lower lobe nodule on 3:99, stable 7 mm solid anterior basal right lower lobe nodule on 3:100, and a few scattered 5  mm and smaller nodules in right middle and lower lobe bases and left lower lobe anterior base. Non-contrast chest CT at 6-12 months is recommended. If the nodule is stable at time of repeat CT, then future CT at 18-24 months (from today's scan) is considered optional for low-risk patients, but is recommended for high-risk patients. This recommendation follows the consensus statement: Guidelines for Management of Incidental Pulmonary Nodules Detected on CT Images: From the Fleischner Society 2017; Radiology 2017; 284:228-243. 5. New patchy airspace disease in both lower lobe basal segments consistent with pneumonia or aspiration. 6. Aortic and coronary artery atherosclerosis. 7. Anemia. 8. Small chronic pericardial effusion.    Assessment/plan: Problem List Items Addressed This Visit   None Visit Diagnoses       Abnormal imaging of intrathoracic organ    -  Primary          Patient has chest ct 10/2023 that showed upper lobe thickwall cavities bilaterally with what appears to be fungal ball left lobe cavity and also diffuse bilateral more toward the base nodules changes  Chronic decline in respiratory function and perhaps more cough/dyspnea the last few days before my visit today  No fever, chill No real weightloss Appetite is chronically poor  On 2-3 liters oxygen stable  Don't have chest ct the past few years so not sure how long she has had these changes  Former smoker  No sign of bronchiectasis on chest ct though  No known hx of cancer else where   My ddx more so infectious. There might be a nodule that is more prominent that might be cancerous but nothing obviously jumped out based on 10/2023 ct and will defer to pulm to comment on that   What I would like to r/o is fungal process or NTM. No tb exposure. As she can't produce sputum, would be good to get bronch for afb/fungal and see if pulm could do a wang biopsy on one of the nodules vs ir referral for that if  they feel needed   In the mean time I will check ige/igg panel for aspergillus and get endemic fungal serology. She has been to [place]   I wouldn't limit this just to aspergillus. And if it was the case of just fungal ball medication might not help   Her symptoms of the  past few days including more cough/dyspnea might not even be related to chest ct finding in 10/2023   -serology for fungal as mentioned; aspergillus igg/ige panel; ige level; eosinophils testing -repeat chest ct in around the next 2-4 weeks -f/u with dr Dwan in July as scheduled; reassure patient that it is ok to have it then and not emergent -no sputum production so will need bronch for Afb, fungal, and aerobic culture -finish prednisone  course and azithromycin  -f/u with me in 2 weeks for symptoms check   -chart forwarded to dr Darlean and Dr Delayne   ---------------- 01/06/24 id clinic assessment Seems lots of gi sx from prednisone /azith but better now Cough also better and more energy/better appetite since finishing prednisone  Ct scan reviewed Pulm following and had given cefpodoxim 7 day course (can't tolerate amoxicillin diannah) Await pet scan and navigational bronchoscopy for potential nodule biopsy   We'll still need bronch culture --> bal cx, afb and fungal culture, and biopsy per pulm discretion  Chart forwarded to dr Theophilus and [facility]  Patient to call our clinic to schedule follow up 4-6 weeks after bronch     Follow-up: No follow-ups on file.  Constance ONEIDA Passer, MD [facility] 01/06/2024, 3:30 PM

## 2024-01-06 NOTE — Patient Instructions (Signed)
 Please check with pulm now that you are feeling well if cefpodoxime  they still want you to take    See me again 4-6 weeks after your bronchoscopy (please call our clinic or send me mychart message once you know the date)

## 2024-01-11 ENCOUNTER — Ambulatory Visit

## 2024-01-11 ENCOUNTER — Ambulatory Visit
Admission: EM | Admit: 2024-01-11 | Discharge: 2024-01-11 | Disposition: A | Discharge status: Home / Self Care | Attending: Family Medicine | Admitting: Family Medicine

## 2024-01-11 DIAGNOSIS — J189 Pneumonia, unspecified organism: Secondary | ICD-10-CM | POA: Diagnosis not present

## 2024-01-11 DIAGNOSIS — R059 Cough, unspecified: Secondary | ICD-10-CM

## 2024-01-11 DIAGNOSIS — R0781 Pleurodynia: Secondary | ICD-10-CM

## 2024-01-11 MED ORDER — AMOXICILLIN-POT CLAVULANATE 875-125 MG PO TABS
1.0000 | ORAL_TABLET | Freq: Two times a day (BID) | ORAL | 0 refills | Status: DC
Start: 2024-01-11 — End: 2024-01-25

## 2024-01-11 MED ORDER — PROMETHAZINE-DM 6.25-15 MG/5ML PO SYRP: 5.0000 mL | ORAL_SOLUTION | Freq: Two times a day (BID) | ORAL | 0 refills | Status: DC | PRN

## 2024-01-11 MED ORDER — PREDNISONE 50 MG PO TABS: ORAL_TABLET | ORAL | 0 refills | Status: DC

## 2024-01-11 MED ORDER — AZITHROMYCIN 250 MG PO TABS: 250.0000 mg | ORAL_TABLET | Freq: Every day | ORAL | 0 refills | Status: DC

## 2024-01-11 NOTE — Discharge Instructions (Addendum)
 Advised patient to take medications as directed with food to completion.  Advised may take Promethazine  DM for cough.  Patient advised of sedative effects of this medication.  Encouraged to increase daily water  intake while taking these medications.  Advised patient to have repeat chest x-ray on or about 03/13/2024 to ensure infection has resolved.  Advised if symptoms worsen and/or unresolved please follow-up with your PCP, infectious disease specialist or here for further evaluation.

## 2024-01-11 NOTE — ED Triage Notes (Signed)
 [facility] with [facility] cough and GERD symptoms for one month. Pt reports she was taking dexlansoprazole  with minimal improvement. Pain new to right rib area reporting worse when trying to take a deep breath.   Pt reports she has an upcoming pet scan on Friday and biopsy soon as well for a lung mass.

## 2024-01-11 NOTE — ED Provider Notes (Signed)
 [facility]    CSN: [id number] Arrival date & time: 01/11/24  0810      History   Chief Complaint No chief complaint on file.   HPI [name] is a 63 y.o. female.   HPI pleasant 63 year old female presents with cough and GERD symptoms for 1 month.  Patient reports taking Dexilant  with little to no improvement.  Patient complains of right rib pain over the past month and worsening over the past 2 weeks.  Patient reports has PET scan scheduled for Friday, 01/14/2024 for lung mass and scheduled for biopsy soon.  PMH significant for CHF, cardiomyopathy, and MS.  Please see CT of chest without contrast of 12/20/2023.  Patient is accompanied by her husband today.  Past Medical History:  Diagnosis Date   Acute liver failure    Anemia    Anorexia nervosa    h/o    CAP (community acquired pneumonia)    Cardiomyopathy (HCC)    CHF (congestive heart failure) (HCC)    Difficult intubation    secondary to jaw muscles   Fibromyalgia    Fibromyalgia syndrome    GERD (gastroesophageal reflux disease)    IBS (irritable bowel syndrome)    MS (multiple sclerosis) (HCC)    PTSD (post-traumatic stress disorder)    Sacral decubitus ulcer    Shock circulatory (HCC)    TMJ disease     Patient Active Problem List   Diagnosis Date Noted   Cavitary lung disease 11/16/2023   COPD GOLD 0 and refuses further testing 05/28/2016   Upper airway cough syndrome 08/19/2015   Dysphagia, pharyngoesophageal phase 10/29/2014   Left-sided chest wall pain 05/07/2014   DOE (dyspnea on exertion) 08/18/2013   Palpitations 07/18/2013   Chronic systolic CHF (congestive heart failure) (HCC) 08/30/2012   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  06/07/2012    Past Surgical History:  Procedure Laterality Date   BUNIONECTOMY     CESAREAN SECTION     DILATION AND CURETTAGE OF UTERUS  2007   TRACHEOSTOMY  06/2012   TUBAL LIGATION      OB History   No obstetric history on file.      Home Medications    Prior to Admission medications   Medication Sig Start Date End Date Taking? Authorizing Provider  amoxicillin -clavulanate (AUGMENTIN ) 875-125 MG tablet Take 1 tablet by mouth every 12 (twelve) hours. 01/11/24  Yes Teddy Sharper, FNP  azithromycin  (ZITHROMAX ) 250 MG tablet Take 1 tablet (250 mg total) by mouth daily. Take first 2 tablets together, then 1 every day until finished. 01/11/24  Yes Teddy Sharper, FNP  predniSONE  (DELTASONE ) 50 MG tablet Take 1 tab p.o. daily for 5 days. 01/11/24  Yes Kowen Kluth, FNP  promethazine -dextromethorphan (PROMETHAZINE -DM) 6.25-15 MG/5ML syrup Take 5 mLs by mouth 2 (two) times daily as needed for cough. 01/11/24  Yes Teddy Sharper, FNP  acetaminophen  (TYLENOL ) 325 MG tablet Take 975 mg by mouth in the morning.    [provider]  bisoprolol  (ZEBETA ) 5 MG tablet TAKE 1/2 TABLET BY MOUTH DAILY 08/02/23   Rolan Ezra RAMAN, MD  cefpodoxime  (  VANTIN ) 200 MG tablet Take 200 mg by mouth 2 (two) times daily. Patient not taking: Reported on 01/06/2024    [provider]  cholecalciferol  (VITAMIN D3) 25 MCG (1000 UNIT) tablet Take 1,000 Units by mouth daily. Takes 2 tabs to equal 2000 daily.    [provider]  dexlansoprazole  (DEXILANT ) 60 MG capsule Take 1 capsule (60 mg total) by mouth daily. 12/28/23   Mannam, Praveen, MD  famotidine  (PEPCID ) 20 MG tablet Take 1 tablet (20 mg total) by mouth 2 (two) times daily. 12/28/23   Mannam, Praveen, MD  irbesartan  (AVAPRO ) 75 MG tablet  TAKE ONE TABLET BY MOUTH ONCE DAILY 08/30/23   McLean, Dalton S, MD  levalbuterol  (XOPENEX  HFA) 45 MCG/ACT inhaler Inhale up to 2 puffs every 4 hours as needed for shortness of breath or wheezing. 03/09/23   Darlean Ozell NOVAK, MD  Magnesium  250 MG TABS Take 250 mg by mouth daily.    [provider]  mometasone -formoterol  (DULERA ) 100-5 MCG/ACT AERO INHALE 2 PUFFS BY MOUTH FIRST THING IN THE MORNING AND 2 PUFFS ABOUT 12 HOURS LATER 11/19/23   Darlean Ozell NOVAK, MD  Multiple Vitamin (MULTIVITAMIN) capsule Take 1 capsule by mouth daily.    [provider]  OXYGEN 2 L at bedtime. Patient wear oxygen Day and Night 2-3 Liters.    [provider]  rosuvastatin  (CRESTOR ) 10 MG tablet Take 1 tablet (10 mg total) by mouth daily. 12/30/23 03/29/24  Lee, [place], NP  Tiotropium Bromide  Monohydrate (SPIRIVA  RESPIMAT) 2.5 MCG/ACT AERS Inhale 2 puffs into the lungs daily. 03/09/23   Darlean Ozell NOVAK, MD    Family History Family History  Problem Relation Age of Onset   Asthma Mother    Uterine cancer Mother    Skin cancer Mother    Asthma Father    Cancer Father        ? type   Asthma Brother    Asthma Sister    Heart disease Maternal Uncle    Heart disease Maternal Grandfather    Thyroid  cancer Brother    Multiple sclerosis Brother    Multiple sclerosis Other    Breast cancer Maternal Aunt     Social History Social History   Tobacco Use   Smoking status: Former    Current packs/day: 0.00    Average packs/day: 1 pack/day for 11.0 years (11.0 ttl pk-yrs)    Types: Cigarettes    Start date: 86    Quit date: 07/07/1987    Years since quitting: 36.5   Smokeless tobacco: Never  Vaping Use   Vaping status: Never Used  Substance Use Topics   Alcohol use: No   Drug use: No     Allergies   Scopolamine, Duloxetine hcl, Ivp dye [iodinated contrast media], Levaquin  [levofloxacin ], Lisinopril , Other, and Carvedilol    Review of Systems Review of Systems  Respiratory:  Positive  for cough.   Musculoskeletal:        Right-sided rib pain for 5 days  All other systems reviewed and are negative.    Physical Exam Triage Vital Signs ED Triage Vitals  Encounter Vitals Group     BP 01/11/24 0824 (!) 113/56     Girls Systolic BP Percentile --      Girls Diastolic BP Percentile --      Boys Systolic BP Percentile --      Boys Diastolic BP Percentile --      Pulse Rate 01/11/24 0824 (!) 119     Resp  01/11/24 0824 20     Temp 01/11/24 0824 98.9 F (37.2 C)     Temp src --      SpO2 01/11/24 0824 99 %     Weight --      Height --      Head Circumference --      Peak Flow --      Pain Score 01/11/24 0823 3     Pain Loc --      Pain Education --      Exclude from Growth Chart --    No data found.  Updated Vital Signs BP (!) 113/56   Pulse (!) 119   Temp 98.9 F (37.2 C)   Resp 20   SpO2 99%    Physical Exam Vitals and nursing note reviewed.  Constitutional:      General: She is not in acute distress.    Appearance: Normal appearance. She is normal weight. She is ill-appearing. She is not toxic-appearing or diaphoretic.  HENT:     Head: Normocephalic and atraumatic.     Right Ear: Tympanic membrane, ear canal and external ear normal.     Left Ear: Tympanic membrane, ear canal and external ear normal.     Mouth/Throat:     Mouth: Mucous membranes are moist.     Pharynx: Oropharynx is clear.  Eyes:     Extraocular Movements: Extraocular movements intact.     Conjunctiva/sclera: Conjunctivae normal.     Pupils: Pupils are equal, round, and reactive to light.  Cardiovascular:     Rate and Rhythm: Normal rate and regular rhythm.     Pulses: Normal pulses.     Heart sounds: Normal heart sounds.  Pulmonary:     Effort: Pulmonary effort is normal.     Breath sounds: No wheezing, rhonchi or rales.     Comments: Diminished breath sounds noted throughout, right-sided rib tenderness over right 7th-9th, no deformity noted Musculoskeletal:         General: Normal range of motion.     Cervical back: Normal range of motion and neck supple.  Skin:    General: Skin is warm and dry.  Neurological:     General: No focal deficit present.     Mental Status: She is alert and oriented to person, place, and time. Mental status is at baseline.  Psychiatric:        Mood and Affect: Mood normal.        Behavior: Behavior normal.      UC Treatments / Results  Labs (all labs ordered are listed, but only abnormal results are displayed) Labs Reviewed - No data to display  EKG   Radiology DG Chest 2 View Result Date: 01/11/2024 CLINICAL DATA:  Cough. EXAM: CHEST - 2 VIEW COMPARISON:  CT chest dated 12/20/2023. FINDINGS: Stable cardiomediastinal contours. Similar biapical pleuroparenchymal scarring with cavitary change at the left lung apex with internal calcification, better evaluated on the prior CT. Increasing patchy opacities at the bilateral lung bases, concerning for multifocal infectious/inflammatory process with background reticulonodular changes. No pneumothorax. No acute osseous abnormality. IMPRESSION: 1. Increased patchy opacities at the bilateral lung bases, concerning for multifocal infectious/inflammatory process with background reticulonodular changes. 2. Similar biapical pleuroparenchymal scarring with cavitary change containing internal calcification at the left lung apex, better evaluated on the prior CT. Electronically Signed   By: Harrietta Sherry M.D.   On: 01/11/2024 09:14    Procedures Procedures (including critical care time)  Medications Ordered in UC  Medications - No data to display  Initial Impression / Assessment and Plan / UC Course  I have reviewed the triage vital signs and the nursing notes.  Pertinent labs & imaging results that were available during my care of the patient were reviewed by me and considered in my medical decision making (see chart for details).      MDM: 1.  Community-acquired bilateral  lower lobe pneumonia-CXR revealed above, patient advised, Rx'd Augmentin  875/125 mg tablet: Take 1 tablet twice daily x 10 days, Rx'd Zithromax  (500 mg day 1, then 250 mg day 2-5 5); 2.  Cough, unspecified type- Rx'd Promethazine  DM 6.25-15 Mg/5 mL syrup: Take 5 mL twice daily, as needed for cough; 3.  Rib pain on the right side-Rx prednisone  50 mg tablet: Take 1 tablet daily x 5 days. Advised patient to take medications as directed with food to completion.  Advised may take Promethazine  DM for cough.  Patient advised of sedative effects of this medication.  Encouraged to increase daily water  intake while taking these medications.  Advised patient to have repeat chest x-ray on or about 03/13/2024 to ensure infection has resolved.  Advised if symptoms worsen and/or unresolved please follow-up with your PCP, infectious disease specialist or here for further evaluation.  Final Clinical Impressions(s) / UC Diagnoses   Final diagnoses:  Cough, unspecified type  Rib pain on right side  Community acquired bilateral lower lobe pneumonia     Discharge Instructions      Advised patient to take medications as directed with food to completion.  Advised may take Promethazine  DM for cough.  Patient advised of sedative effects of this medication.  Encouraged to increase daily water  intake while taking these medications.  Advised patient to have repeat chest x-ray on or about 03/13/2024 to ensure infection has resolved.  Advised if symptoms worsen and/or unresolved please follow-up with your PCP, infectious disease specialist or here for further evaluation.     ED Prescriptions     Medication Sig Dispense Auth. Provider   amoxicillin -clavulanate (AUGMENTIN ) 875-125 MG tablet Take 1 tablet by mouth every 12 (twelve) hours. 14 tablet Zaydee Aina, FNP   azithromycin  (ZITHROMAX ) 250 MG tablet Take 1 tablet (250 mg total) by mouth daily. Take first 2 tablets together, then 1 every day until finished. 6 tablet  Reesha Debes, FNP   predniSONE  (DELTASONE ) 50 MG tablet Take 1 tab p.o. daily for 5 days. 5 tablet Chaden Doom, FNP   promethazine -dextromethorphan (PROMETHAZINE -DM) 6.25-15 MG/5ML syrup Take 5 mLs by mouth 2 (two) times daily as needed for cough. 118 mL Shadonna Benedick, FNP      I have reviewed the PDMP during this encounter.   Teddy Sharper, FNP 01/11/24 1050

## 2024-01-14 ENCOUNTER — Encounter (HOSPITAL_COMMUNITY)
Admission: RE | Admit: 2024-01-14 | Discharge: 2024-01-14 | Disposition: A | Discharge status: Home / Self Care | Source: Ambulatory Visit | Attending: Pulmonary Disease

## 2024-01-14 DIAGNOSIS — J984 Other disorders of lung: Secondary | ICD-10-CM | POA: Diagnosis present

## 2024-01-14 LAB — GLUCOSE, CAPILLARY: Glucose-Capillary: 97 mg/dL (ref 70–99)

## 2024-01-14 MED ORDER — FLUDEOXYGLUCOSE F - 18 (FDG) INJECTION
5.3500 | Freq: Once | INTRAVENOUS | Status: AC
  Administered 2024-01-14: 5.05 via INTRAVENOUS

## 2024-01-18 ENCOUNTER — Ambulatory Visit: Payer: Self-pay | Admitting: Pulmonary Disease

## 2024-01-18 DIAGNOSIS — J984 Other disorders of lung: Secondary | ICD-10-CM

## 2024-01-19 ENCOUNTER — Encounter: Payer: Self-pay | Admitting: Internal Medicine

## 2024-01-19 ENCOUNTER — Encounter: Payer: Self-pay | Admitting: Emergency Medicine

## 2024-01-19 NOTE — Telephone Encounter (Signed)
 Patient followed by Dr.Mannam for bilateral nodular infiltrates on CT chest.  Will plan for navigational bronchoscopy, culture data, biopsies.  Hopefully can be arranged for 7/22 if she is able. No anticoagulation   Please schedule the following:  Provider performing procedure: [name] Diagnosis: Bilateral nodular infiltrates Which side if for nodule / mass?  Bilateral Procedure: Robotic assisted navigational bronchoscopy  Has patient been spoken to by Provider and given informed consent?  Yes by Dr. Theophilus Anesthesia: General Do you need Fluro? yes Duration of procedure: 60 minute dates Date: 01/25/24 Alternate Date:   Time: any Location: [facility] Does patient have OSA? no DM? no Or Latex allergy? no Medication Restriction/ Anticoagulate/Antiplatelet: none Pre-op Labs Ordered:determined by Anesthesia Imaging request: available in PACS  (If, [facility] CT Chest, please have STAT courier sent to [facility])

## 2024-01-19 NOTE — Telephone Encounter (Signed)
-----   Message from Lamar GORMAN Chris, MD sent at 01/19/2024  7:41 AM EDT -----

## 2024-01-24 ENCOUNTER — Other Ambulatory Visit: Payer: Self-pay

## 2024-01-24 ENCOUNTER — Telehealth: Payer: Self-pay

## 2024-01-24 ENCOUNTER — Encounter (HOSPITAL_COMMUNITY): Payer: Self-pay | Admitting: Emergency Medicine

## 2024-01-24 NOTE — Telephone Encounter (Signed)
 Pt called back b/c she said she no longer needs a cal back.  The admitting nurse who called this am answered her question.

## 2024-01-24 NOTE — Anesthesia Preprocedure Evaluation (Signed)
 Anesthesia Evaluation  Patient identified by MRN, date of birth, ID band Patient awake    Reviewed: Allergy & Precautions, H&P , NPO status , Patient's Chart, lab work & pertinent test results  History of Anesthesia Complications (+) DIFFICULT AIRWAY and history of anesthetic complications  Airway Mallampati: IV   Neck ROM: full  Mouth opening: Limited Mouth Opening  Dental   Pulmonary COPD, former smoker   breath sounds clear to auscultation       Cardiovascular +CHF and + DOE   Rhythm:regular Rate:Normal     Neuro/Psych  PSYCHIATRIC DISORDERS Anxiety      Neuromuscular disease    GI/Hepatic ,GERD  ,,  Endo/Other    Renal/GU      Musculoskeletal  (+)  Fibromyalgia -  Abdominal   Peds  Hematology   Anesthesia Other Findings   Reproductive/Obstetrics                              Anesthesia Physical Anesthesia Plan  ASA: 3  Anesthesia Plan: General   Post-op Pain Management:    Induction: Intravenous  PONV Risk Score and Plan: 3 and Ondansetron , Dexamethasone , Treatment may vary due to age or medical condition and Midazolam   Airway Management Planned: Oral ETT and Video Laryngoscope Planned  Additional Equipment:   Intra-op Plan:   Post-operative Plan: Extubation in OR  Informed Consent: I have reviewed the patients History and Physical, chart, labs and discussed the procedure including the risks, benefits and alternatives for the proposed anesthesia with the patient or authorized representative who has indicated his/her understanding and acceptance.     Dental advisory given  Plan Discussed with: CRNA, Anesthesiologist and Surgeon  Anesthesia Plan Comments: (See PAT note written 01/24/2024 by Allison Zelenak, PA-C.  Difficult intubation history (including loss of teeth) in 06/2012 due to limited oral opening. Also with undetermined neuromuscular disorder, initially  thought possibly MS but scans were negative. PAT note outlined 2013 intubation records and previously neurology records. )         Anesthesia Quick Evaluation

## 2024-01-24 NOTE — Progress Notes (Signed)
 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 Multiple Sclerosis is listed, but she does not carry a formal diagnosis. She had issues with spasticity since her 20's and had a half brother die with progressive MS and an aunt with relapsing-remitting MS, so reportedly thought she may have an associated diagnosis. She had prior neurology evaluations including with Dohmeier, Dedra, MD with last normal brain and spinal MRIs in 2009 and reported EMG/NCV as well without definitive diagnosis. She had refused LP.   Prolonged admission 06/06/12-06/20/12 (with Discharge to [facility]) for AMS, weakness, profound anemia (HGB 3), severe malnutrition, hypotension, hypothermia, severe sepsis with + Enterococcal UTI and Serratia PNA, sacral decubitus, liver failure with coagulopathy. This was in the setting of longtime  undetermined neuromuscular disease with spasticity and muscle weakness with more acute decline in mobility and swallowing the month prior. She required PRBC, IV antibiotics, milrinone , tracheostomy, enteral feeds. During admission, she had neurology evaluation by Michaela Pay, MD who noted her previous neurology evaluation with Dr. Chalice  without definitive diagnosis. 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 Stiff person syndrome or other autoimmune disease also considered, but added, At this time, she has had an extensive previous workup, and I do not feel that performing a large number of low yield tests would be beneficial at this point. An anti-GAD antibody may be reasonable.   For DIFFICULT INTUBATION history:  06/07/12 (Zubelevitskiy, Florian, MD with PCCM): The patient was placed supine, with head in sniffing position. After adequate level of anesthesia was achieved, a GS#3 blade was inserted into the oropharynx and the vocal cords were visualized. A 7.0 endotracheal tube was inserted with some difficulty due to limited jaw mobility and visualized going through the vocal cords. The stylette was removed and cuff inflated. Colorimetric change was noted on the CO2 meter. Breath sounds were heard over both lung fields equally. Post procedure chest xray was ordered. Extubated 06/10/12.   06/13/12 Havery Spence, MD with PCCM):  Three loose teeth were removed after falling out of the patient's mouth during intubation.  Was an extremely difficult airway. (I otherwise could not locate documentation of techniques used to intubate.) She ultimately underwent bedside percutaneous tracheostomy on 06/17/12 due to prolonged recovery and concern for another failed extubation with known difficult airway and underlying undefined neuromuscular disease.    She has been followed [facility] by Dr. Rolan since her 06/2012 admission. As above, cardiomyopathy felt likely non-ischemic and due to septic or Takotsubo-type cardiomyopathy. LVEF 20-25% at that time with diffuse LV hypokinesis. LVEF 50-55% with no RWMA on 11/30/12 TTE. She has had several follow-up TTEs, last on 11/25/21 that showed LVEF 55-60%, no RWMA, normal RV systolic function. Per her last visit  with Dr. Rolan on 11/22/23, volume  status was okay and suspected respiratory symptoms were lung related. Continue bisoprolol  and irbesartan . He does not plan to repeat echo unless she develops signs of CHF. He noted pulmonology following for new cavitary lesions. One year follow-up planned. Since then patient did contact cardiology to review 12/20/23 CT results also showing findings of aortic and coronary calcifications and a small chronic pericardial effusion. Per notation by Lee, [place], NP, will start Crestor  and follow-up LFTs and Lipid panel, but otherwise no new recommendations.    She was referred to ID Dr. Overton in June 2025 by pulmonologist Dr. Darlean due to cavitary lesions on April 2025 CT chest imaging. May CXR suggested a fungus ball at the left apex. She had been on Dulera  and Spiriva  for COPD Gold 0 (refused She had reported a progressive cough. Given fungal or atypical TB infection, Dr. Darlean decreased Dulera , referred to one of his partners and to ID. She also took a course of Levaquin  and prednisone . He had prescribed voriconazole , although notes suggest she did not want to start unless definitive diagnosis. Testing included negative Quantiferon Gold on 11/15/23, negative Fungitell and negative Aspergillus Ab on 11/22/23. Other testing includes Histoplasma Antigen, Aspergillus, Coccidioides AV, Blastomyces, Ab from 12/14/23 which ID described as rather 'bland' and planned to await bronchoscopy results to determine next steps. .     Since then, she had an Urgent Care visit 01/11/24 for cough and GERD symptoms x 1 month, as well as right rib pain. Lung sounds diminished but no wheezes, rales, or rhonchi noted. Right rib tenderness present. CXR showed increased patchy opacities at the lung bases, concerning for multifocal infectious/inflammatory process with similar biapical scarring and cavitary changes at left apex. She was treated with Augmentin  (changed to cefpodoxime ), Azithromycin , and prescribed  another 5 day course of prednisone . She told me the UC visit was more due to concern she had a fractured right rib and not for acute sick symptoms.   I called and spoke with patient. She noted that while she still suffers for spasticity and weakness, she is stronger now than in 2013. After about 9 years she was able to walk without a walker. She still has a wheelchair for prolonged walking activity. She can do ADLs independently. She has had some degree of dysphagia since her 45's. She is primarily on a liquid-soft food diet (ie, smoothies, baby food, blended cereal, mashed potatoes, water , soft green beans). Her neck sits forward and predisposes her to headaches with chewing. She also has limited mouth opening. With her intubation on 06/13/12, she reported loss of 4 teeth, which she never had replaced. She continues to have very limited mouth opening. She described < 2 cm between upper and lower teeth. She is able to go to the dentist for cleanings about every 4 months and says they are able to lie her fairly flat and clean her teeth utilizing the extra space crated by her missing front teeth. She believes her tracheostomy was decannulated while in [facility] and her PEG tube removed about a year following placement.   For about the past 2 months, she noticed a more persistent cough and need to resume home O2 at 2-3 L/[place]. Following her 06/2012 admission, she was on chronic prednisone  for 2-3 years, but then used only as needed for exacerbations. She had not required prednisone  for about 3 years, until her new cough about 2 months prior. At her worse she was about 70% from her usual baseline and now after the  second round of antibiotics (completed 01/17/24) feels she was about 10% from baseline. She was able to carry on an extended phone conversations without coughing or conversational dyspnea noted. She uses 3 L/[place]. Her GERD is much better controlled now that she was switched from omeprazole   to Dexilant . She denied fever or chills. She had only one episode of blood tinged sputum. She has a history of left sided epistaxis, but not in many years.   She is most concerned about not being able to have the procedure due to inability to intubate. If anesthesia department had been notified sooner, she could have come in for an airway assessment earlier, but she will have a day of surgery exam. She had already been told by Dr. Loral that nasal intubation may not ben an option given need for ~ 8 mm ETT.  Discussed that additional advanced airway equipment could likely be used and awake intubation may be considered, but more definitive anesthesia plan and decision to proceed will be made on the day of surgery following evaluation by her anesthesiologist. Discussed that undetermined neuromuscular disease would also be considered in her anesthesia plan. I reviewed case with anesthesiologist Leonce Athens, MD as well.    VS: Ht 5' 4.5 (1.638 m)   Wt 44.9 kg   BMI 16.73 kg/m  BP Readings from Last 3 Encounters:  01/11/24 (!) 113/56  01/06/24 (!) 96/45  12/14/23 107/64   Pulse Readings from Last 3 Encounters:  01/11/24 (!) 119  01/06/24 81  12/14/23 78     PROVIDERS: Aisha Harvey, MD is PCP  Rolan Barrack, MD is HF cardiologist  Darlean Sharper, MD & Theophilus Roosevelt, MD are her pulmonlogists Overton Faith, MD is ID   LABS: Most recent lab results include: Lab Results  Component Value Date   WBC 5.7 12/14/2023   HGB 12.1 12/14/2023   HCT 37.3 12/14/2023   PLT 256 12/14/2023   GLUCOSE 86 11/15/2023   CHOL 172 11/02/2022   TRIG 103 11/02/2022   HDL 64 11/02/2022   LDLCALC 87 11/02/2022   NA 138 11/15/2023   K 4.0 11/15/2023   CL 100 11/15/2023   CREATININE 0.50 11/15/2023   BUN 14 11/15/2023   CO2 30 11/15/2023    IMAGES: PET Scan 01/14/24: IMPRESSION: - Multifocal nodular areas of opacity scattered in both lungs but predominant along the lower lung zones. Few of these  areas to the left upper lobe are cavitary. Some areas in the lower lobes have increased in size and others decreased but several of these foci have significant abnormal radiotracer uptake. Differential would include active atypical infection, inflammatory state, such as sarcoidosis, but neoplasm is still in the differential with the level of uptake and the changes. Please correlate for any known history. Otherwise recommend further evaluation. Tissue sampling could be considered. - More mild uptake along nodes in the hila of the lung bilaterally and 1 at the thoracic inlet on the left. These are indeterminate. - Solitary bony area of abnormal uptake involving the right eleventh rib. No clear fracture seen on the CT scan. Please correlate for the setting of malignancy.   CXR 01/11/24: IMPRESSION: 1. Increased patchy opacities at the bilateral lung bases, concerning for multifocal infectious/inflammatory process with background reticulonodular changes. 2. Similar biapical pleuroparenchymal scarring with cavitary change containing internal calcification at the left lung apex, better evaluated on the prior CT.   CT Chest 12/20/23: IMPRESSION: 1. The mostly thin walled cavity in the more cephalad left upper lobe  apex does measure slightly larger today, 4.6 x 3.0 x 2.6 cm compared with 4.2 x 2.6 x 2.5 cm previously. The solid component in the inferior aspect of the cavity is unchanged in size at 2.1 x 1.7 cm, with calcification within the solid component. 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 2. The 10 x 7 mm nodule being followed in the medial left lung apex is stable. 3. Other multifocal branching nodularity in the lingula, right middle and both lower lobes, consistent with multifocal subsegmental airway impactions with bronchiectasis, is not notably changed in the interval since the last exam but this was not seen in 2013. 4. Some of the nodules are not confirmed as branching, for example a new 7 mm right lower lobe  nodule on 3:99, stable 7 mm solid anterior basal right lower lobe nodule on 3:100, and a few scattered 5 mm and smaller nodules in right middle and lower lobe bases and left lower lobe anterior base. Non-contrast chest CT at 6-12 months is recommended. If the nodule is stable at time of repeat CT, then future CT at 18-24 months (from today's scan) is considered optional for low-risk patients, but is recommended for high-risk patients. This recommendation follows the consensus statement: Guidelines for Management of Incidental Pulmonary Nodules Detected on CT Images: From the Fleischner Society 2017; Radiology 2017; 284:228-243. 5. New patchy airspace disease in both lower lobe basal segments consistent with pneumonia or aspiration. 6. Aortic and coronary artery atherosclerosis. 7. Anemia. 8. Small chronic pericardial effusion.  MRA Neck 04/04/10: IMPRESSION:  Some technical limitations.  No suspicion of carotid or vertebral injury.  If there is residual clinical suspicion, the study could be repeated.   MRI Brain/C-T-L-Spine 08/19/07: IMPRESSION:   Normal MRI of the brain.  Normal MRI of the cervical spine.  Normal MRI of the thoracic spine.  Negative MRI of the lumbar spine except for an insignificant disk bulge at L3-4.    EKG: 11/22/23: Normal sinus rhythm Normal ECG When compared with ECG of 02-Nov-2022 15:25, No significant change was found Confirmed by Rolan Barrack [phone]) on 11/22/2023 3:21:47 PM   CV: Echo 11/25/21: IMPRESSIONS   1. Left ventricular ejection fraction, by estimation, is 55 to 60%. Left ventricular ejection fraction by 3D volume is 55 %. The left ventricle has normal function. The left ventricle has no regional wall motion abnormalities. Left ventricular diastolic parameters were normal. The average left ventricular global longitudinal strain is -25.8 %. The global longitudinal strain is normal.   2. Right ventricular systolic function is normal. The right  ventricular size is normal. Tricuspid regurgitation signal is inadequate for assessing PA pressure.   3. The mitral valve is normal in structure. No evidence of mitral valve regurgitation. No evidence of mitral stenosis.   4. The aortic valve is normal in structure. Aortic valve regurgitation is not visualized. No aortic stenosis is present.   5. The inferior vena cava is normal in size with greater than 50% respiratory variability, suggesting right atrial pressure of 3 mmHg.    Cardiac event monitor 08/13/17 - 08/24/17 (only 92.59 hours readable data 34%): No significant arrhythmias noted though she did not wear the monitor very long.    Past Medical History:  Diagnosis Date   Acute liver failure    Anemia    Anorexia nervosa    h/o    CAP (community acquired pneumonia)    Cardiomyopathy (HCC)    CHF (congestive heart failure) (HCC)    Difficult intubation  secondary to jaw muscles   Fibromyalgia    Fibromyalgia syndrome    GERD (gastroesophageal reflux disease)    IBS (irritable bowel syndrome)    MS (multiple sclerosis) (HCC)    PTSD (post-traumatic stress disorder)    Sacral decubitus ulcer    Shock circulatory (HCC)    TMJ disease     Past Surgical History:  Procedure Laterality Date   BUNIONECTOMY     CESAREAN SECTION     x 2   DILATION AND CURETTAGE OF UTERUS  07/06/2005   PEG TUBE REMOVAL     THORACENTESIS Bilateral 2013   TONSILLECTOMY     TRACHEOSTOMY  06/05/2012   tracheostomy reversal     TUBAL LIGATION      MEDICATIONS: No current facility-administered medications for this encounter.    acetaminophen  (TYLENOL ) 325 MG tablet   amoxicillin -clavulanate (AUGMENTIN ) 875-125 MG tablet   azithromycin  (ZITHROMAX ) 250 MG tablet   bisoprolol  (ZEBETA ) 5 MG tablet   cefpodoxime  (VANTIN ) 200 MG tablet   cholecalciferol  (VITAMIN D3) 25 MCG (1000 UNIT) tablet   dexlansoprazole  (DEXILANT ) 60 MG capsule   famotidine  (PEPCID ) 20 MG tablet   irbesartan  (AVAPRO ) 75 MG  tablet   levalbuterol  (XOPENEX  HFA) 45 MCG/ACT inhaler   Magnesium  250 MG TABS   mometasone -formoterol  (DULERA ) 100-5 MCG/ACT AERO   Multiple Vitamin (MULTIVITAMIN) capsule   OXYGEN   predniSONE  (DELTASONE ) 50 MG tablet   promethazine -dextromethorphan (PROMETHAZINE -DM) 6.25-15 MG/5ML syrup   rosuvastatin  (CRESTOR ) 10 MG tablet   Tiotropium Bromide  Monohydrate (SPIRIVA  RESPIMAT) 2.5 MCG/ACT AERS   Not currently taking Augmentin , Zithromax , Vantin , Pepcid , prednisone , Crestor , Promethazine -DM.    Isaiah Ruder, PA-C Surgical Short Stay/Anesthesiology [facility] Phone [phone] [facility] Phone [phone] 01/25/2024 2:24 PM

## 2024-01-24 NOTE — Telephone Encounter (Unsigned)
 Copied from CRM [id number]. Topic: Clinical - Medical Advice >> Jan 24, 2024  8:07 AM Corean SAUNDERS wrote: Reason for CRM: Patient is requesting a call back from Lauraine Lites as she needs to ask  a question regarding her bronchoscopy and states she will talk with the nurse of Lauraine Mead as well.

## 2024-01-24 NOTE — Progress Notes (Signed)
 SDW call  Patient was given pre-op instructions over the phone. Patient verbalized understanding of instructions provided.     PCP - Dr. Sari Pay HF Cardiologist - Dr. Ezra Shuck Pulmonary:    PPM/ICD - denies Device Orders - na Rep Notified - na   Chest x-ray - DOS EKG -  11/22/2023 Stress Test - ECHO - 11/25/2021 Cardiac Cath -   Sleep Study/sleep apnea/CPAP: denies.  Patient is on home oxygen 24/7  Non-diabetic  Blood Thinner Instructions: denies Aspirin Instructions:denies   ERAS Protcol - NPO   Anesthesia review: Yes.  Difficult intubation, home oxygen, MS, CHF, liver failure   Patient denies shortness of breath, fever, cough and chest pain over the phone call  Your procedure is scheduled on Tuesday January 25, 2024  Report to [facility] Main Entrance A at 1130 A.M., then check in with the Admitting office.  Call this number if you have problems the morning of surgery:  [phone]   If you have any questions prior to your surgery date call [phone]: Open Monday-Friday 8am-4pm If you experience any cold or flu symptoms such as cough, fever, chills, shortness of breath, etc. between now and your scheduled surgery, please notify us  at the above number     Remember:  Do not eat or drink after midnight the night before your surgery  Take these medicines the morning of surgery with A SIP OF WATER :  Bisoprolol , dexilant , pepcid , dulera , spiriva , tylenol   As needed: xopenex   As of today, STOP taking any Aspirin (unless otherwise instructed by your surgeon) Aleve, Naproxen, Ibuprofen, Motrin, Advil, Goody's, BC's, all herbal medications, fish oil, and all vitamins.

## 2024-01-25 ENCOUNTER — Ambulatory Visit (HOSPITAL_COMMUNITY)

## 2024-01-25 ENCOUNTER — Ambulatory Visit (HOSPITAL_BASED_OUTPATIENT_CLINIC_OR_DEPARTMENT_OTHER): Admitting: Vascular Surgery

## 2024-01-25 ENCOUNTER — Ambulatory Visit (HOSPITAL_COMMUNITY)
Admission: RE | Admit: 2024-01-25 | Discharge: 2024-01-25 | Disposition: A | Discharge status: Home / Self Care | Attending: Emergency Medicine | Admitting: Emergency Medicine

## 2024-01-25 ENCOUNTER — Encounter (HOSPITAL_COMMUNITY)
Admission: RE | Disposition: A | Discharge status: Home / Self Care | Payer: Self-pay | Source: Home / Self Care | Attending: Emergency Medicine

## 2024-01-25 ENCOUNTER — Ambulatory Visit (HOSPITAL_COMMUNITY): Admitting: Vascular Surgery

## 2024-01-25 ENCOUNTER — Encounter (HOSPITAL_COMMUNITY): Payer: Self-pay | Admitting: Emergency Medicine

## 2024-01-25 DIAGNOSIS — F419 Anxiety disorder, unspecified: Secondary | ICD-10-CM | POA: Diagnosis not present

## 2024-01-25 DIAGNOSIS — M26609 Unspecified temporomandibular joint disorder, unspecified side: Secondary | ICD-10-CM | POA: Diagnosis not present

## 2024-01-25 DIAGNOSIS — R918 Other nonspecific abnormal finding of lung field: Secondary | ICD-10-CM | POA: Insufficient documentation

## 2024-01-25 DIAGNOSIS — Z79899 Other long term (current) drug therapy: Secondary | ICD-10-CM | POA: Diagnosis not present

## 2024-01-25 DIAGNOSIS — I429 Cardiomyopathy, unspecified: Secondary | ICD-10-CM | POA: Diagnosis not present

## 2024-01-25 DIAGNOSIS — G35 Multiple sclerosis: Secondary | ICD-10-CM | POA: Insufficient documentation

## 2024-01-25 DIAGNOSIS — I2510 Atherosclerotic heart disease of native coronary artery without angina pectoris: Secondary | ICD-10-CM | POA: Insufficient documentation

## 2024-01-25 DIAGNOSIS — R0609 Other forms of dyspnea: Secondary | ICD-10-CM | POA: Diagnosis not present

## 2024-01-25 DIAGNOSIS — D649 Anemia, unspecified: Secondary | ICD-10-CM | POA: Insufficient documentation

## 2024-01-25 DIAGNOSIS — I3139 Other pericardial effusion (noninflammatory): Secondary | ICD-10-CM | POA: Diagnosis not present

## 2024-01-25 DIAGNOSIS — K219 Gastro-esophageal reflux disease without esophagitis: Secondary | ICD-10-CM | POA: Insufficient documentation

## 2024-01-25 DIAGNOSIS — M797 Fibromyalgia: Secondary | ICD-10-CM | POA: Diagnosis not present

## 2024-01-25 DIAGNOSIS — J449 Chronic obstructive pulmonary disease, unspecified: Secondary | ICD-10-CM

## 2024-01-25 DIAGNOSIS — B479 Mycetoma, unspecified: Secondary | ICD-10-CM | POA: Diagnosis not present

## 2024-01-25 DIAGNOSIS — I509 Heart failure, unspecified: Secondary | ICD-10-CM | POA: Insufficient documentation

## 2024-01-25 DIAGNOSIS — Z7951 Long term (current) use of inhaled steroids: Secondary | ICD-10-CM | POA: Insufficient documentation

## 2024-01-25 DIAGNOSIS — J984 Other disorders of lung: Secondary | ICD-10-CM | POA: Insufficient documentation

## 2024-01-25 DIAGNOSIS — I5022 Chronic systolic (congestive) heart failure: Secondary | ICD-10-CM | POA: Diagnosis not present

## 2024-01-25 DIAGNOSIS — K589 Irritable bowel syndrome without diarrhea: Secondary | ICD-10-CM | POA: Insufficient documentation

## 2024-01-25 DIAGNOSIS — Z87891 Personal history of nicotine dependence: Secondary | ICD-10-CM | POA: Insufficient documentation

## 2024-01-25 HISTORY — PX: VIDEO BRONCHOSCOPY WITH ENDOBRONCHIAL NAVIGATION: SHX6175

## 2024-01-25 LAB — CBC
HCT: 36.7 % (ref 36.0–46.0)
Hemoglobin: 11.7 g/dL — ABNORMAL LOW (ref 12.0–15.0)
MCH: 27.5 pg (ref 26.0–34.0)
MCHC: 31.9 g/dL (ref 30.0–36.0)
MCV: 86.4 fL (ref 80.0–100.0)
Platelets: 327 K/uL (ref 150–400)
RBC: 4.25 MIL/uL (ref 3.87–5.11)
RDW: 13.7 % (ref 11.5–15.5)
WBC: 5.7 K/uL (ref 4.0–10.5)
nRBC: 0 % (ref 0.0–0.2)

## 2024-01-25 LAB — BASIC METABOLIC PANEL WITH GFR
Anion gap: 13 (ref 5–15)
BUN: 14 mg/dL (ref 6–20)
CO2: 25 mmol/L (ref 22–32)
Calcium: 9.5 mg/dL (ref 8.9–10.3)
Chloride: 99 mmol/L (ref 98–111)
Creatinine, Ser: 0.54 mg/dL (ref 0.44–1.00)
GFR, Estimated: >60 mL/min (ref >60)
Glucose, Bld: 82 mg/dL (ref 70–99)
Potassium: 3.6 mmol/L (ref 3.5–5.1)
Sodium: 137 mmol/L (ref 135–145)

## 2024-01-25 SURGERY — VIDEO BRONCHOSCOPY WITH ENDOBRONCHIAL NAVIGATION
Anesthesia: General | Laterality: Bilateral

## 2024-01-25 MED ORDER — PHENYLEPHRINE HCL (PRESSORS) 10 MG/ML IV SOLN
INTRAVENOUS | Status: DC | PRN
  Administered 2024-01-25 (×2): 80 ug via INTRAVENOUS

## 2024-01-25 MED ORDER — FAMOTIDINE 20 MG PO TABS: 20.0000 mg | ORAL_TABLET | Freq: Every day | ORAL | Status: AC

## 2024-01-25 MED ORDER — CHLORHEXIDINE GLUCONATE 0.12 % MT SOLN
OROMUCOSAL | Status: AC
  Administered 2024-01-25: 15 mL via OROMUCOSAL
  Filled 2024-01-25: qty 15

## 2024-01-25 MED ORDER — ONDANSETRON HCL 4 MG/2ML IJ SOLN: INTRAMUSCULAR | Status: DC | PRN

## 2024-01-25 MED ORDER — PROPOFOL 10 MG/ML IV BOLUS
INTRAVENOUS | Status: DC | PRN
  Administered 2024-01-25: 40 mg via INTRAVENOUS

## 2024-01-25 MED ORDER — PHENYLEPHRINE HCL-NACL 20-0.9 MG/250ML-% IV SOLN
INTRAVENOUS | Status: DC | PRN
  Administered 2024-01-25: 40 ug/min via INTRAVENOUS

## 2024-01-25 MED ORDER — PROPOFOL 500 MG/50ML IV EMUL
INTRAVENOUS | Status: DC | PRN
  Administered 2024-01-25: 30 mg via INTRAVENOUS
  Administered 2024-01-25: 100 ug/kg/min via INTRAVENOUS

## 2024-01-25 MED ORDER — DEXAMETHASONE SODIUM PHOSPHATE 10 MG/ML IJ SOLN
INTRAMUSCULAR | Status: DC | PRN
  Administered 2024-01-25: 10 mg via INTRAVENOUS

## 2024-01-25 MED ORDER — ONDANSETRON HCL 4 MG/2ML IJ SOLN
INTRAMUSCULAR | Status: DC | PRN
  Administered 2024-01-25: 4 mg via INTRAVENOUS

## 2024-01-25 MED ORDER — ROCURONIUM BROMIDE 10 MG/ML (PF) SYRINGE
PREFILLED_SYRINGE | INTRAVENOUS | Status: DC | PRN
  Administered 2024-01-25: 10 mg via INTRAVENOUS
  Administered 2024-01-25: 40 mg via INTRAVENOUS
  Administered 2024-01-25: 10 mg via INTRAVENOUS

## 2024-01-25 MED ORDER — SUGAMMADEX SODIUM 200 MG/2ML IV SOLN
INTRAVENOUS | Status: DC | PRN
  Administered 2024-01-25: 100 mg via INTRAVENOUS

## 2024-01-25 MED ORDER — LACTATED RINGERS IV SOLN: INTRAVENOUS | Status: DC

## 2024-01-25 MED ORDER — LIDOCAINE 2% (20 MG/ML) 5 ML SYRINGE
INTRAMUSCULAR | Status: DC | PRN
  Administered 2024-01-25: 45 mg via INTRAVENOUS

## 2024-01-25 MED ORDER — CHLORHEXIDINE GLUCONATE 0.12 % MT SOLN: 15.0000 mL | Freq: Once | OROMUCOSAL | Status: AC

## 2024-01-25 SURGICAL SUPPLY — 36 items
ADAPTER BRONCHOSCOPE OLYMPUS (ADAPTER) ×1 IMPLANT
ADAPTER VALVE BIOPSY EBUS (MISCELLANEOUS) IMPLANT
BAG COUNTER SPONGE SURGICOUNT (BAG) ×1 IMPLANT
BRUSH CYTOL CELLEBRITY 1.5X140 (MISCELLANEOUS) ×1 IMPLANT
BRUSH SUPERTRAX BIOPSY (INSTRUMENTS) IMPLANT
BRUSH SUPERTRAX NDL-TIP CYTO (INSTRUMENTS) ×1 IMPLANT
CANISTER SUCTION 3000ML PPV (SUCTIONS) ×1 IMPLANT
CNTNR URN SCR LID CUP LEK RST (MISCELLANEOUS) ×1 IMPLANT
COVER BACK TABLE 60X90IN (DRAPES) ×1 IMPLANT
FILTER STRAW FLUID ASPIR (MISCELLANEOUS) IMPLANT
FORCEPS BIOP 1.5 SINGLE USE (MISCELLANEOUS) ×1 IMPLANT
FORCEPS BIOP SUPERTRX PREMAR (INSTRUMENTS) ×1 IMPLANT
GAUZE SPONGE 4X4 12PLY STRL (GAUZE/BANDAGES/DRESSINGS) ×1 IMPLANT
GLOVE BIO SURGEON STRL SZ7.5 (GLOVE) ×2 IMPLANT
GOWN STRL REUS W/ TWL LRG LVL3 (GOWN DISPOSABLE) ×2 IMPLANT
KIT CLEAN ENDO COMPLIANCE (KITS) ×1 IMPLANT
KIT LOCATABLE GUIDE (CANNULA) IMPLANT
KIT MARKER FIDUCIAL DELIVERY (KITS) IMPLANT
KIT TURNOVER KIT B (KITS) ×1 IMPLANT
MARKER SKIN DUAL TIP RULER LAB (MISCELLANEOUS) ×1 IMPLANT
NDL SUPERTRX PREMARK BIOPSY (NEEDLE) ×1 IMPLANT
NEEDLE SUPERTRX PREMARK BIOPSY (NEEDLE) ×1 IMPLANT
NS IRRIG 1000ML POUR BTL (IV SOLUTION) ×1 IMPLANT
OIL SILICONE PENTAX (PARTS (SERVICE/REPAIRS)) ×1 IMPLANT
PAD ARMBOARD POSITIONER FOAM (MISCELLANEOUS) ×2 IMPLANT
PATCHES PATIENT (LABEL) ×3 IMPLANT
SYR 20ML ECCENTRIC (SYRINGE) ×1 IMPLANT
SYR 20ML LL LF (SYRINGE) ×1 IMPLANT
SYR 50ML SLIP (SYRINGE) ×1 IMPLANT
TOWEL GREEN STERILE FF (TOWEL DISPOSABLE) ×1 IMPLANT
TRAP SPECIMEN MUCUS 40CC (MISCELLANEOUS) IMPLANT
TUBE CONNECTING 20X1/4 (TUBING) ×1 IMPLANT
UNDERPAD 30X36 HEAVY ABSORB (UNDERPADS AND DIAPERS) ×1 IMPLANT
VALVE BIOPSY SINGLE USE (MISCELLANEOUS) ×1 IMPLANT
VALVE SUCTION BRONCHIO DISP (MISCELLANEOUS) ×1 IMPLANT
WATER STERILE IRR 1000ML POUR (IV SOLUTION) ×1 IMPLANT

## 2024-01-25 NOTE — Discharge Instructions (Signed)

## 2024-01-25 NOTE — Op Note (Signed)
 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  Estimated Blood Loss: Minimal  Complications: None  Indications and History: [name] is a 63 y.o. female with history of former tobacco use.  She was found to have an abnormal CT scan of the chest with a left upper lobe cavity and apparent mycetoma, scattered pulmonary nodular disease and larger opacity/masses.  Recommendation made to achieve a tissue diagnosis via robotic assisted navigational bronchoscopy.  The risks, benefits, complications, treatment options and expected outcomes were discussed with the patient.  The possibilities of pneumothorax, pneumonia, reaction to medication, pulmonary aspiration, perforation of a viscus, bleeding, failure to diagnose a condition and creating a complication requiring transfusion or operation were discussed with the patient who freely signed the consent.    Description of Procedure: The patient was seen in the Preoperative Area, was examined and was deemed appropriate to proceed.  The patient was taken to [facility] Endoscopy room 3, identified as [name] and the procedure verified as Flexible Video Fiberoptic Bronchoscopy.  A Time Out was held and the above information confirmed.   Prior to the date of the procedure a high-resolution CT scan of the chest was performed. Utilizing ION software program a virtual tracheobronchial tree was generated to allow the creation of distinct navigation pathways to the patient's parenchymal abnormalities. After being taken to the operating room general anesthesia was initiated and the  patient  was orally intubated. The video fiberoptic bronchoscope was introduced via the endotracheal tube and a general inspection was performed which showed normal right and left lung anatomy. Aspiration of the bilateral mainstems was completed to remove any remaining secretions.  Tracheal aspirate was collected and sent for microbiology (bacterial, mycobacterial, fungal).  Robotic catheter inserted into patient's endotracheal tube.   Target #1 right lower lobe mass: The distinct navigation pathways prepared prior to this procedure were then utilized to navigate to patient's lesion identified on CT scan. The robotic catheter was secured into place and the vision probe was withdrawn.  Lesion location was approximated using fluoroscopy.  Local registration and targeting was performed using [employer] Cios mobile C-arm three-dimensional imaging. Under fluoroscopic guidance transbronchial needle biopsies and transbronchial forceps biopsies were performed to be sent for cytology and pathology.  Needle-in-lesion was confirmed using Cios mobile C-arm.    Target #2 right lower lobe nodule: The distinct navigation pathways prepared prior to this procedure were then utilized to navigate to patient's lesion identified on CT scan. The robotic catheter was secured into place and the vision probe was withdrawn.  Lesion location was approximated using fluoroscopy.  Local registration and targeting was performed using [employer] Cios mobile C-arm three-dimensional imaging. Under fluoroscopic guidance transbronchial needle brushings, transbronchial needle biopsies, and transbronchial forceps biopsies were performed to be sent for cytology and pathology.  Needle-in-lesion was confirmed using Cios mobile C-arm.    Target #3 right lower lobe nodule: The distinct navigation pathways prepared prior to this procedure were then utilized to navigate to patient's lesion identified on CT scan. The robotic  catheter was secured into place and the vision probe was withdrawn.  Lesion location was approximated using fluoroscopy.  Local registration and targeting was performed using [employer] Cios mobile  C-arm three-dimensional imaging. Under fluoroscopic guidance transbronchial needle brushings, transbronchial needle biopsies, and transbronchial forceps biopsies were performed to be sent for cytology and pathology.  Needle-in-lesion was confirmed using Cios mobile C-arm.    Target #4 left upper lobe mass: The distinct navigation pathways prepared prior to this procedure were then utilized to navigate to patient's lesion identified on CT scan. The robotic catheter was secured into place and the vision probe was withdrawn.  Lesion location was approximated using fluoroscopy.  Local registration and targeting was performed using [employer] Cios mobile C-arm three-dimensional imaging. Under fluoroscopic guidance transbronchial needle biopsies, and transbronchial forceps biopsies were performed to be sent for cytology and pathology.  A bronchioalveolar lavage was performed in the left upper lobe and sent for microbiology.    At the end of the procedure a general airway inspection was performed and there was no evidence of active bleeding. The bronchoscope was removed.  The patient tolerated the procedure well. There was no significant blood loss and there were no obvious complications. A post-procedural chest x-ray is pending.  Samples Target #1: 1. Transbronchial Wang needle biopsies from right lower lobe mass 2. Transbronchial forceps biopsies from right lower lobe mass  Samples Target #2: 1. Transbronchial needle brushings from right lower lobe nodule 2. Transbronchial Wang needle biopsies from right lower lobe nodule 3. Transbronchial forceps biopsies from right lower lobe nodule  Samples Target #3: 1. Transbronchial needle brushings from right lower lobe nodule 2. Transbronchial Wang  needle biopsies from right lower lobe nodule 3. Transbronchial forceps biopsies from right lower lobe nodule  Samples Target #4: 1. Transbronchial Wang needle biopsies from left upper lobe mass 2. Transbronchial forceps biopsies from left upper lobe mass 3. Bronchoalveolar lavage from left upper lobe  Other samples: 1.  Tracheal aspirate for microbiology   Plans:  The patient will be discharged from the PACU to home when recovered from anesthesia and after chest x-ray is reviewed. We will review the cytology, pathology and microbiology results with the patient when they become available. Outpatient followup will be with Dr Theophilus.    Lamar Chris, MD, PhD 01/25/2024, 2:56 PM [facility] [phone] or if no answer before 7:00PM call [phone] For any issues after 7:00PM please call eLink [phone]

## 2024-01-25 NOTE — Anesthesia Procedure Notes (Addendum)
 Procedure Name: Intubation Date/Time: 01/25/2024 1:22 PM  Performed by: Maryclare Cornet, MDPre-anesthesia Checklist: Patient identified, Emergency Drugs available, Suction available and Patient being monitored Patient Re-evaluated:Patient Re-evaluated prior to induction Oxygen Delivery Method: Circle system utilized Preoxygenation: Pre-oxygenation with 100% oxygen Induction Type: IV induction Ventilation: Mask ventilation without difficulty Laryngoscope Size: Glidescope and 3 Grade View: Grade I Tube type: Oral Tube size: 8.5 mm Number of attempts: 1 Airway Equipment and Method: Video-laryngoscopy and Rigid stylet Placement Confirmation: ETT inserted through vocal cords under direct vision, positive ETCO2 and breath sounds checked- equal and bilateral Secured at: 21 cm Tube secured with: Tape Dental Injury: Teeth and Oropharynx as per pre-operative assessment  Difficulty Due To: Difficulty was anticipated and Difficult Airway- due to limited oral opening Future Recommendations: Recommend- induction with short-acting agent, and alternative techniques readily available

## 2024-01-25 NOTE — Interval H&P Note (Signed)
 History and Physical Interval Note:  01/25/2024 12:02 PM  [name]  has presented today for surgery, with the diagnosis of bilateral nodular opacities.  The various methods of treatment have been discussed with the patient and family. After consideration of risks, benefits and other options for treatment, the patient has consented to  Procedure(s): VIDEO BRONCHOSCOPY WITH ENDOBRONCHIAL NAVIGATION (Bilateral) as a surgical intervention.  The patient's history has been reviewed, patient examined, no change in status, stable for surgery.  I have reviewed the patient's chart and labs.  Questions were answered to the patient's satisfaction.     Lamar GORMAN Chris

## 2024-01-25 NOTE — Transfer of Care (Signed)
 Immediate Anesthesia Transfer of Care Note  Patient: [name]  Procedure(s) Performed: VIDEO BRONCHOSCOPY WITH ENDOBRONCHIAL NAVIGATION (Bilateral)  Patient Location: PACU  Anesthesia Type:General  Level of Consciousness: awake, alert , oriented, and patient cooperative  Airway & Oxygen Therapy: Patient Spontanous Breathing and Patient connected to nasal cannula oxygen  Post-op Assessment: Report given to RN and Post -op Vital signs reviewed and stable  Post vital signs: Reviewed and stable  Last Vitals:  Vitals Value Taken Time  BP 107/57 01/25/24 15:02  Temp 36.3 C 01/25/24 14:59  Pulse 86 01/25/24 15:11  Resp 21 01/25/24 15:01  SpO2 97 % 01/25/24 15:11  Vitals shown include unfiled device data.  Last Pain:  Vitals:   01/25/24 1459  TempSrc: Temporal  PainSc:          Complications: No notable events documented.

## 2024-01-25 NOTE — Progress Notes (Signed)
 Patient states wounds on RLE (Bruised now was stage II), Stage II on L buttock (Now healed), Stage IV R buttock (Healed but scarred tissue).  IF admitted requesting an air mattress.

## 2024-01-26 ENCOUNTER — Ambulatory Visit: Admitting: Pulmonary Disease

## 2024-01-26 ENCOUNTER — Ambulatory Visit: Admitting: Acute Care

## 2024-01-27 ENCOUNTER — Encounter (HOSPITAL_COMMUNITY): Payer: Self-pay | Admitting: Emergency Medicine

## 2024-01-27 LAB — ACID FAST SMEAR (AFB, MYCOBACTERIA)
Acid Fast Smear: NEGATIVE
Acid Fast Smear: NEGATIVE

## 2024-01-27 LAB — CYTOLOGY - NON PAP

## 2024-01-27 NOTE — Anesthesia Postprocedure Evaluation (Signed)
 Anesthesia Post Note  Patient: [name]  Procedure(s) Performed: VIDEO BRONCHOSCOPY WITH ENDOBRONCHIAL NAVIGATION (Bilateral)     Patient location during evaluation: PACU Anesthesia Type: General Level of consciousness: awake and alert Pain management: pain level controlled Vital Signs Assessment: post-procedure vital signs reviewed and stable Respiratory status: spontaneous breathing, nonlabored ventilation, respiratory function stable and patient connected to nasal cannula oxygen Cardiovascular status: blood pressure returned to baseline and stable Postop Assessment: no apparent nausea or vomiting Anesthetic complications: no   No notable events documented.  Last Vitals:  Vitals:   01/25/24 1520 01/25/24 1530  BP: (!) 112/54 (!) 107/51  Pulse: 83 72  Resp: 14 11  Temp:    SpO2: 96% 98%    Last Pain:  Vitals:   01/25/24 1530  TempSrc:   PainSc: 8                  Steward Sames S

## 2024-01-29 LAB — CULTURE, RESPIRATORY W GRAM STAIN

## 2024-01-29 LAB — ANAEROBIC CULTURE W GRAM STAIN

## 2024-01-30 LAB — AEROBIC/ANAEROBIC CULTURE W GRAM STAIN (SURGICAL/DEEP WOUND): Culture: NO GROWTH

## 2024-01-31 LAB — CULTURE, BAL-QUANTITATIVE W GRAM STAIN
Culture: 3000 — AB
Gram Stain: NONE SEEN

## 2024-02-02 ENCOUNTER — Encounter: Payer: Self-pay | Admitting: Pulmonary Disease

## 2024-02-02 ENCOUNTER — Other Ambulatory Visit (HOSPITAL_COMMUNITY)
Admission: RE | Admit: 2024-02-02 | Discharge: 2024-02-02 | Disposition: A | Discharge status: Home / Self Care | Source: Ambulatory Visit | Attending: Pulmonary Disease | Admitting: Pulmonary Disease

## 2024-02-02 ENCOUNTER — Ambulatory Visit (INDEPENDENT_AMBULATORY_CARE_PROVIDER_SITE_OTHER): Admitting: Pulmonary Disease

## 2024-02-02 VITALS — BP 100/82 | HR 75 | Ht 64.0 in | Wt 98.4 lb

## 2024-02-02 DIAGNOSIS — J984 Other disorders of lung: Secondary | ICD-10-CM

## 2024-02-02 DIAGNOSIS — R0609 Other forms of dyspnea: Secondary | ICD-10-CM | POA: Diagnosis not present

## 2024-02-02 DIAGNOSIS — J449 Chronic obstructive pulmonary disease, unspecified: Secondary | ICD-10-CM

## 2024-02-02 MED ORDER — IPRATROPIUM-ALBUTEROL 0.5-2.5 (3) MG/3ML IN SOLN
3.0000 mL | Freq: Four times a day (QID) | RESPIRATORY_TRACT | Status: AC | PRN
Start: 2024-02-03 — End: ?

## 2024-02-02 NOTE — Patient Instructions (Signed)
  VISIT SUMMARY: During your visit, we discussed your worsening cough, shortness of breath, and recent bronchoscopy results. We reviewed the findings from your bronchoscopy, including the presence of aspergillosis, pseudomonas, and granulomatous inflammation. We also addressed your concerns about the adequacy of the sampling and the significance of the infections found.  YOUR PLAN: -CHRONIC PULMONARY ASPERGILLOSIS, LEFT UPPER LOBE: Chronic pulmonary aspergillosis is a long-term fungal infection in the lungs. We identified fungal elements in your left lung, and we will monitor this condition with a blood test to check fungal activity levels and a CT scan in one month.  Per discussion with Dr. Alvia from ID treatment is deferred for now  -PSEUDOMONAS COLONIZATION OF LUNG: Pseudomonas colonization means that the bacteria are present in your lungs but are not causing an active infection. The bacteria are sensitive to many antibiotics, and treatment is deferred as it may not be necessary if it is just a colonization.  -CHRONIC COUGH WITH SPUTUM PRODUCTION AND SHORTNESS OF BREATH: Your chronic cough and shortness of breath have worsened since your bronchoscopy. We will start nebulizer treatments and consider chest physiotherapy to help clear mucus and improve your symptoms.  -GRANULOMATOUS INFLAMMATION OF RIGHT LOWER LOBE: Granulomatous inflammation is a type of inflammation in your lung tissue. We will monitor this condition with imaging and reassess if your symptoms worsen.  INSTRUCTIONS: Please follow up with the blood test to check fungal activity levels and schedule a CT scan in one month to monitor your condition. Continue using Spiriva  and Xopenex  as needed, and start the nebulizer treatments as discussed. Consider chest physiotherapy to help clear mucus. If your symptoms worsen or you have any concerns, please contact our office.                      Contains text generated by  Abridge.                                 Contains text generated by Abridge.

## 2024-02-02 NOTE — Addendum Note (Signed)
 Addended byBETHA THEOPHILUS ROOSEVELT on: 02/02/2024 04:06 PM   Modules accepted: Orders

## 2024-02-02 NOTE — Progress Notes (Signed)
 [name]    [id number]    1961-01-04  Primary Care Physician:McNeill, Sari, MD  Referring Physician: Aisha Sari, MD [address] [place],  [place] [address]  Chief complaint:  Follow-up for  COPD Cavitary lung lesions  HPI: 63 y.o. who  has a past medical history of Acute liver failure, Anemia, Anorexia nervosa, CAP (community acquired pneumonia), Cardiomyopathy (HCC), CHF (congestive heart failure) (HCC), Difficult intubation, Fibromyalgia, Fibromyalgia syndrome, GERD (gastroesophageal reflux disease), IBS (irritable bowel syndrome), MS (multiple sclerosis) (HCC), PTSD (post-traumatic stress disorder), Sacral decubitus ulcer, Shock circulatory (HCC), and TMJ disease.  Discussed the use of AI scribe software for clinical note transcription with the patient, who gave verbal consent to proceed.  History of Present Illness [name] is a 63 year old female with a cavitary lung lesion, nodular opacities, COPD and GERD who presents with worsening GERD symptoms, cough and aspiration.  She is a patient of Dr. Darlean  She has been experiencing persistent cough and worsening GERD symptoms for the past two weeks after consuming fatty foods that are not GERD-friendly. Her usual medication, omeprazole  40 mg twice daily, which has kept her stable for years, is no longer effective. She experiences constant heartburn and GERD symptoms that are higher in her throat than usual, causing significant discomfort during activities such as bending, talking, and eating. She has been aspirating into her lungs daily, particularly at night, despite sleeping at a 90-degree angle. She has increased her omeprazole  dose by taking an extra pill at night, but it has not alleviated her symptoms.  She has a history of abnormal CT with cavitary lesion in the left upper lobe with mycetoma and nodular opacities in the lower lobe that has been under investigation since April  2025. She is scheduled to see Dr. Annella next month for evaluation of bronchoscopy and is currently under the care of Dr. Overton at the infectious disease clinic. She experienced odd fatigue and loss of consciousness in April, leading to the discovery of the lung abnormality during a CT scan of her head.   She has a long-standing history of dysphagia and a jaw problem, which has required her to eat a soft diet, often consisting of pureed foods. Despite these issues, she had no swallowing problems until the recent dietary indiscretion. She has been managing her COPD with prednisone  and antibiotics as needed, but recent prednisone  use worsened her cough.  Interim history: [name] is a 63 year old female with a history of severe respiratory illness who presents with worsening cough and concerns about recent bronchoscopy results.  She underwent navigational bronchoscopy and biopsies by Dr. Shelah on 01/25/2024  Cough and sputum production - Severe, persistent cough worsening since bronchoscopy performed last Tuesday - Cough is difficult to suppress, especially during conversations - Large volume of mucus production, sometimes bloody, yellow, and green - Cough is not typical for her baseline respiratory status  Dyspnea and fatigue - Shortness of breath began concurrently with cough after bronchoscopy - Associated fatigue present since onset of current symptoms  Bronchoscopy findings and concerns - Bronchoscopy samples showed granulomatous inflammation in right lower lobe and aspergillosis elements in left lung - Pathology also revealed pseudomonas aeruginosa - Concern about adequacy of sampling, as only one sample from each lung was satisfactory for evaluation - Worried about significance and resistance of pseudomonas infection  Recent antimicrobial therapy - Completed course of cefixime and azithromycin  for aspirational pneumonia,  ending July 14th - Ongoing concern for  persistent or resistant infection despite antibiotic therapy  Oropharyngeal trauma - Significant bruising and scraping of tongue during intubation for bronchoscopy - Tongue injury is still healing  Medication use and changes - Current medications include Spiriva  and Xopenex  as needed - Discontinued Dulera  after it exacerbated cough post-bronchoscopy  History of severe respiratory illness - History of severe respiratory illness, including prior episodes of respiratory failure and septic shock - Concern about potential severity of current condition given past medical history  Outpatient Encounter Medications as of 02/02/2024  Medication Sig   acetaminophen  (TYLENOL ) 325 MG tablet Take 975 mg by mouth daily.   bisoprolol  (ZEBETA ) 5 MG tablet TAKE 1/2 TABLET BY MOUTH DAILY   cholecalciferol  (VITAMIN D3) 25 MCG (1000 UNIT) tablet Take 2,000 Units by mouth daily.   dexlansoprazole  (DEXILANT ) 60 MG capsule Take 1 capsule (60 mg total) by mouth daily.   famotidine  (PEPCID ) 20 MG tablet Take 1 tablet (20 mg total) by mouth at bedtime.   irbesartan  (AVAPRO ) 75 MG tablet TAKE ONE TABLET BY MOUTH ONCE DAILY   levalbuterol  (XOPENEX  HFA) 45 MCG/ACT inhaler Inhale up to 2 puffs every 4 hours as needed for shortness of breath or wheezing.   Magnesium  250 MG TABS Take 250 mg by mouth daily.   Multiple Vitamins-Minerals (MULTIVITAMIN WOMEN 50+) TABS Take 1 tablet by mouth daily.   OXYGEN Inhale 2-3 L/min into the lungs continuous.   Tiotropium Bromide  Monohydrate (SPIRIVA  RESPIMAT) 2.5 MCG/ACT AERS Inhale 2 puffs into the lungs daily.   mometasone -formoterol  (DULERA ) 200-5 MCG/ACT AERO Inhale 2 puffs into the lungs daily. (Patient not taking: Reported on 02/02/2024)   No facility-administered encounter medications on file as of 02/02/2024.   Physical Exam: Tele visit  Data Reviewed: Imaging:  CT chest 10/28/2023-thick-walled cavitary lesion in the left lung apex with internal septation and nodularity,  new multifocal areas of mucous plugging, nodules and groundglass with lower lung predominance.  CT chest 12/20/2023-prelim read by me shows persistent thick-walled cavity in the left lung apex with worsening areas of nodules and groundglass opacities in the lower lungs.  PET scan 01/18/2024-multifocal nodular opacity with left upper lobe cavitation with waxing and waning nature in the lower lobes.  I have reviewed the images personally.  PFTs: 08/01/2013-significant obstruction  Labs: Bronchoscopy 01/25/2024 Cytology-granulomatous inflammation and right lower lobe.  No malignant cells identified, fungal elements consistent with Aspergillus identified in the left upper lobe fine needle aspiration Cultures-Pseudomonas-3000 colonies, AFB smear negative.  Fungal cultures are pending Assessment & Plan Cavitary lesion Possible chronic pulmonary aspergillosis is present in the left upper lobe, likely due to a mycetoma. Fungal elements were identified on the left side, with cultures pending.  Not clear if she has invasive aspergillosis in the absence of biopsy.  Discussed with Dr. Constance Passer from ID.  Treatment is deferred as it may not improve the condition and could be a result of past infection - Order galactomannan blood test to check fungal activity level.  Follow-up with infectious disease clinic - Order CT scan in one month to monitor condition  Pseudomonas colonization of lung Pseudomonas aeruginosa cultured is pan-sensitive, indicating sensitivity to many antibiotics. The colonization may stem from a past infection and is not causing an active infection currently. Treatment is deferred as it may not be necessary if it is merely a colonization, per ID advice.  Chronic cough with sputum production and shortness of breath COPD Chronic cough with sputum production and shortness  of breath has worsened post-procedure, severely impacting daily activities. She uses Spiriva  and Xopenex  as needed; Dulera   was discontinued due to adverse effects. Symptoms may relate to underlying pulmonary conditions. - Initiate nebulizer treatment - Consider chest physiotherapy with flutter valve to help clear mucus  Granulomatous inflammation of right lower lobe Granulomatous inflammation is present in the right lower lobe, likely related to underlying pulmonary conditions. Current management involves monitoring with imaging and reassessment if symptoms worsen.  Recommendations: Continue inhalers Start DuoNebs, flutter valve Follow-up CT in 1 month  Syreeta Figler MD [facility] 02/02/2024, 3:24 PM  CC: Aisha Harvey, MD

## 2024-02-04 ENCOUNTER — Other Ambulatory Visit: Payer: Self-pay

## 2024-02-04 ENCOUNTER — Telehealth: Payer: Self-pay

## 2024-02-04 ENCOUNTER — Ambulatory Visit: Payer: Self-pay | Admitting: Emergency Medicine

## 2024-02-04 MED ORDER — IPRATROPIUM-ALBUTEROL 0.5-2.5 (3) MG/3ML IN SOLN: 3.0000 mL | Freq: Four times a day (QID) | RESPIRATORY_TRACT | 3 refills | Status: AC | PRN

## 2024-02-04 NOTE — Telephone Encounter (Signed)
 Copied from CRM [id number]. Topic: Clinical - Prescription Issue >> Feb 04, 2024  8:41 AM Leila BROCKS wrote: Reason for CRM: Patient [phone] states Dr. Theophilus prescribed a nebulizer but there's no solution. Patient states the name of medication ipratropium-albuterol  (DUONEB) 0.5-2.5 (3) MG/3ML nebulizer solution 3 mL  in the after care summary but it was not sent. [facility] will send nebulizer on Monday. Patient asked for nebulizer solution to be sent to [facility] [address] [place] - [address] [place] [address] Phone: [phone] Fax: [phone]  Spoke with patient Rx sent to Pharmacy per request of patient.   NFN-

## 2024-02-07 LAB — ASPERGILLUS ANTIGEN, BAL/SERUM: Aspergillus Ag, BAL/Serum: 0.04 {index} (ref 0.00–0.49)

## 2024-02-08 LAB — ANAEROBIC CULTURE W GRAM STAIN: Gram Stain: NONE SEEN

## 2024-02-25 LAB — FUNGUS CULTURE WITH STAIN

## 2024-02-25 LAB — FUNGUS CULTURE RESULT

## 2024-02-25 LAB — FUNGAL ORGANISM REFLEX

## 2024-02-26 LAB — FUNGUS CULTURE WITH STAIN

## 2024-02-26 LAB — FUNGUS CULTURE RESULT

## 2024-02-26 LAB — FUNGAL ORGANISM REFLEX

## 2024-03-01 LAB — MAC SUSCEPTIBILITY BROTH
Ciprofloxacin: 8
Clarithromycin: 8
Doxycycline: >8
Linezolid: >32
Minocycline: >8
Rifabutin: 0.25
Rifampin: 4
Streptomycin: 32

## 2024-03-01 LAB — ACID FAST SMEAR (AFB, MYCOBACTERIA): Acid Fast Smear: NEGATIVE

## 2024-03-01 LAB — FUNGAL ORGANISM REFLEX

## 2024-03-01 LAB — FUNGUS CULTURE WITH STAIN

## 2024-03-01 LAB — ACID FAST ID BY PCR AND SUSCEPTIBILITIES
M Tuberculosis Complex: NEGATIVE
M avium complex: POSITIVE — AB

## 2024-03-01 LAB — ACID FAST CULTURE WITH REFLEXED SENSITIVITIES (MYCOBACTERIA): Acid Fast Culture: POSITIVE — AB

## 2024-03-01 LAB — FUNGUS CULTURE RESULT

## 2024-03-03 ENCOUNTER — Ambulatory Visit
Admission: RE | Admit: 2024-03-03 | Discharge: 2024-03-03 | Disposition: A | Discharge status: Home / Self Care | Source: Ambulatory Visit | Attending: Pulmonary Disease | Admitting: Pulmonary Disease

## 2024-03-03 DIAGNOSIS — J984 Other disorders of lung: Secondary | ICD-10-CM

## 2024-03-07 ENCOUNTER — Other Ambulatory Visit: Payer: Self-pay | Admitting: Internal Medicine

## 2024-03-07 ENCOUNTER — Ambulatory Visit: Admitting: Pulmonary Disease

## 2024-03-07 ENCOUNTER — Ambulatory Visit (INDEPENDENT_AMBULATORY_CARE_PROVIDER_SITE_OTHER): Admitting: Pulmonary Disease

## 2024-03-07 ENCOUNTER — Encounter: Payer: Self-pay | Admitting: Pulmonary Disease

## 2024-03-07 VITALS — BP 106/65 | HR 66 | Temp 98.7°F | Ht 62.0 in | Wt 99.4 lb

## 2024-03-07 DIAGNOSIS — K219 Gastro-esophageal reflux disease without esophagitis: Secondary | ICD-10-CM

## 2024-03-07 DIAGNOSIS — J479 Bronchiectasis, uncomplicated: Secondary | ICD-10-CM | POA: Diagnosis not present

## 2024-03-07 DIAGNOSIS — R634 Abnormal weight loss: Secondary | ICD-10-CM

## 2024-03-07 DIAGNOSIS — A319 Mycobacterial infection, unspecified: Secondary | ICD-10-CM

## 2024-03-07 DIAGNOSIS — J449 Chronic obstructive pulmonary disease, unspecified: Secondary | ICD-10-CM

## 2024-03-07 DIAGNOSIS — B4481 Allergic bronchopulmonary aspergillosis: Secondary | ICD-10-CM | POA: Diagnosis not present

## 2024-03-07 DIAGNOSIS — B965 Pseudomonas (aeruginosa) (mallei) (pseudomallei) as the cause of diseases classified elsewhere: Secondary | ICD-10-CM

## 2024-03-07 DIAGNOSIS — R0609 Other forms of dyspnea: Secondary | ICD-10-CM

## 2024-03-07 DIAGNOSIS — J984 Other disorders of lung: Secondary | ICD-10-CM

## 2024-03-07 MED ORDER — STIOLTO RESPIMAT 2.5-2.5 MCG/ACT IN AERS
2.0000 | INHALATION_SPRAY | Freq: Every day | RESPIRATORY_TRACT | 3 refills | Status: DC
Start: 2024-03-07 — End: 2024-03-15

## 2024-03-07 MED ORDER — ZITHROMAX Z-PAK 250 MG PO TABS
ORAL_TABLET | ORAL | 3 refills | Status: DC
Start: 2024-03-07 — End: 2024-03-13

## 2024-03-07 MED ORDER — SODIUM CHLORIDE 3 % IN NEBU
INHALATION_SOLUTION | Freq: Two times a day (BID) | RESPIRATORY_TRACT | 12 refills | Status: DC
Start: 2024-03-07 — End: 2024-03-20

## 2024-03-07 MED ORDER — LEVALBUTEROL TARTRATE 45 MCG/ACT IN AERO: INHALATION_SPRAY | RESPIRATORY_TRACT | 3 refills | Status: DC

## 2024-03-07 MED ORDER — DEXLANSOPRAZOLE 60 MG PO CPDR: 60.0000 mg | DELAYED_RELEASE_CAPSULE | Freq: Every day | ORAL | 3 refills | Status: AC

## 2024-03-07 MED ORDER — PREDNISONE 10 MG PO TABS
40.0000 mg | ORAL_TABLET | Freq: Every day | ORAL | 3 refills | Status: DC
Start: 2024-03-07 — End: 2024-03-15

## 2024-03-07 NOTE — Progress Notes (Addendum)
 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 who  has a past medical history of Acute liver failure, Anemia, Anorexia nervosa, CAP (community acquired pneumonia), Cardiomyopathy (HCC), CHF (congestive heart failure) (HCC), Difficult intubation, Fibromyalgia, Fibromyalgia syndrome, GERD (gastroesophageal reflux disease), IBS (irritable bowel syndrome), MS (multiple sclerosis) (HCC), PTSD (post-traumatic stress disorder), Sacral decubitus ulcer, Shock circulatory (HCC), and TMJ disease.  Discussed the use of AI scribe software for clinical note transcription with the patient, who gave verbal consent to proceed.  History of Present Illness [name] is a 63 year old female with a cavitary lung lesion, nodular opacities, COPD and GERD who presents with worsening GERD symptoms, cough and aspiration.  She is a patient of Dr. Darlean  She has been experiencing persistent cough and worsening GERD symptoms for the past two weeks after consuming fatty foods that are not GERD-friendly. Her usual medication, omeprazole  40 mg twice daily, which has kept her stable for years, is no longer effective. She experiences constant heartburn and GERD symptoms that are higher in her throat than usual, causing significant discomfort during activities such as bending, talking, and eating. She has been aspirating into her lungs daily, particularly at night, despite sleeping at a 90-degree angle. She has increased her omeprazole  dose by taking an extra pill at night, but it has not alleviated her symptoms.  She has a history of abnormal CT with cavitary lesion in the left upper lobe with  mycetoma and nodular opacities in the lower lobe that has been under investigation since April 2025. She underwent navigational bronchoscopy and BAL, biopsies by Dr. Shelah on 01/25/2024.  Path showed granulomatous inflammation with Pseudomonas [3K colonies], Aspergillus and MAC growing on cultures.  No evidence of malignancy  Cardiopulmonary and neurological comorbidities - History of severe hospitalization in 2012-2013 for respiratory failure, liver failure, and septic shock, requiring ventilator support and tracheostomy - History of congestive heart failure and cardiomyopathy - Stage four sacral wound in past medical history - Family history of multiple sclerosis, with concern for neurological weakness and pain - Concern about impact of airway clearance devices (e.g., percussion vest) on neurological condition  Interim history: [name] is a 63 year old female with chronic lung conditions who presents with concerns about changes in respiratory symptoms and weight loss.  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; pending further consultation regarding need for treatment - Previous Aspergillus antigen tests normal  Nutritional status and weight loss - Significant weight loss with difficulty regaining previously lost weight - Difficulty eating due to coughing and gastroesophageal reflux disease  (GERD) - Considering reintroduction of PEG tube for nutritional support  Airway clearance and medication intolerance -  Unable to use flutter valve due to jaw issues - Considering hypertonic saline for mucus clearance - Currently using Spiriva  Respimat and nebulized Duoneb - Previously used Dulera , discontinued due to adverse effects - History of prednisone  and azithromycin  use for exacerbations  Outpatient Encounter Medications as of 03/07/2024  Medication Sig   acetaminophen  (TYLENOL ) 325 MG tablet Take 975 mg by mouth daily.   bisoprolol  (ZEBETA ) 5 MG tablet TAKE 1/2 TABLET BY MOUTH DAILY   cholecalciferol  (VITAMIN D3) 25 MCG (1000 UNIT) tablet Take 2,000 Units by mouth daily.   dexlansoprazole  (DEXILANT ) 60 MG capsule Take 1 capsule (60 mg total) by mouth daily.   famotidine  (PEPCID ) 20 MG tablet Take 1 tablet (20 mg total) by mouth at bedtime.   ipratropium-albuterol  (DUONEB) 0.5-2.5 (3) MG/3ML SOLN Take 3 mLs by nebulization every 6 (six) hours as needed.   irbesartan  (AVAPRO ) 75 MG tablet TAKE ONE TABLET BY MOUTH ONCE DAILY   levalbuterol  (XOPENEX  HFA) 45 MCG/ACT inhaler Inhale up to 2 puffs every 4 hours as needed for shortness of breath or wheezing.   Magnesium  250 MG TABS Take 250 mg by mouth daily.   mometasone -formoterol  (DULERA ) 200-5 MCG/ACT AERO Inhale 2 puffs into the lungs daily.   Multiple Vitamins-Minerals (MULTIVITAMIN WOMEN 50+) TABS Take 1 tablet by mouth daily.   OXYGEN Inhale 2-3 L/min into the lungs continuous.   Tiotropium Bromide  Monohydrate (SPIRIVA  RESPIMAT) 2.5 MCG/ACT AERS INHALE 2 PUFFS BY MOUTH ONCE DAILY   [DISCONTINUED] Tiotropium Bromide  Monohydrate (SPIRIVA  RESPIMAT) 2.5 MCG/ACT AERS Inhale 2 puffs into the lungs daily.   Facility-Administered Encounter Medications as of 03/07/2024  Medication   ipratropium-albuterol  (DUONEB) 0.5-2.5 (3) MG/3ML nebulizer solution 3 mL   Vitals:   03/07/24 1558  BP: 106/65  Pulse: 66  Temp: 98.7 F (37.1 C)  Height:  5' 2 (1.575 m)  Weight: 99 lb 6.4 oz (45.1 kg)  SpO2: 99%  TempSrc: Temporal  BMI (Calculated): 18.18     Physical Exam GEN: No acute distress. CV: Regular rate and rhythm, no murmurs. LUNGS: Clear to auscultation bilaterally, normal respiratory effort. SKIN JOINTS: Warm and dry, no rash.    Data Reviewed: Imaging:  CT chest 10/28/2023-thick-walled cavitary lesion in the left lung apex with internal septation and nodularity, new multifocal areas of mucous plugging, nodules and groundglass with lower lung predominance.  CT chest 12/20/2023- Persistent thick-walled cavity in the left lung apex with worsening areas of nodules and groundglass opacities in the lower lungs.  PET scan 01/18/2024-multifocal nodular opacity with left upper lobe cavitation with waxing and waning nature in the lower lobes.  CT scan 03/03/2024-stable left upper lobe cavitation with intracavity mycetoma.  Improving lower lobe nodular opacities and inflammatory lesion, bronchiectasis I have reviewed the images personally.  PFTs: 08/01/2013-significant obstruction  Labs: Bronchoscopy 01/25/2024 Cytology-granulomatous inflammation and right lower lobe.  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 Normal aspergillus antigen levels argue against invasive infection. Current CT scan shows no progression, suggesting stability. The lesion may contribute to dyspnea. - Monitor symptoms and CT scans for changes in cavitary lesion size or complications such as hemoptysis. - Consider surgical intervention or embolization if complications like hemoptysis develop.  Bronchiectasis with chronic cough  and sputum production, complicated by chronic Pseudomonas colonization  and pulmonary Mycobacterium avium complex (MAC) infection Chronic bronchiectasis with persistent cough and sputum production. Recent increase in sputum production, darker green color, and hemoptysis. Low Pseudomonas colonization and presence of MAC infection. Current CT scan shows slight improvement in nodules. Treatment for MAC is prolonged and may cause weight loss and side effects. Decision to treat MAC deferred until further consultation with ID.  Will discuss with Dr. Overton from ID.  Difficulty with mucus clearance due to underlying neurological issues, unable to use a flutter valve. - Start nebulized hypertonic saline to loosen mucus. - Order percussion vest for chest physiotherapy, with adjustable vibration settings. - Provide sputum cup for culture if sputum production allows. - Switch Spiriva  to Stiolto for better bronchodilation.  Avoid inhaled corticosteroids due to infections - Order annual medications including azithromycin , prednisone , and dexamethasone  for flare-ups.  Chronic obstructive pulmonary disease (COPD) COPD with chronic cough and sputum production. Current management includes Spiriva , which will be switched to Stiolto for enhanced bronchodilation without steroids. - Switch Spiriva  to Stiolto for enhanced bronchodilation.  Unintentional weight loss and malnutrition risk Continued weight loss and difficulty gaining weight due to increased energy expenditure from coughing and dietary restrictions from GERD. Previous PEG tube placement during hospitalization in 2012-2013 for respiratory failure and other complications. Considering PEG tube replacement if weight loss continues. Currently managing weight through dietary intake but considering PEG tube replacement if unable to maintain weight. - Monitor weight and nutritional intake. - Consider PEG tube replacement if weight loss persists.  Gastroesophageal reflux  disease (GERD) GERD managed with Dexilant , effective without causing diarrhea. GERD contributes to dietary restrictions and may exacerbate coughing. - Continue Dexilant  for GERD management.  Recommendations: Change Spiriva  to [employer] Start hypertonic saline nebs, percussion vest for mucociliary clearance  Lonna Coder MD [facility] 03/07/2024, 4:13 PM  CC: Aisha Harvey, MD  Addendum: Discussed with Dr. Overton, infectious disease who has follow-up scheduled with patient Will continue to monitor symptoms and make decision on treatment for mycobacteria based on progression of symptoms. Will need to balance risk/benefit of multidrug MAI treatment as she is already struggling with weight loss and malnutrition.  Lonna Coder MD [facility] 03/17/2024, 9:51 AM

## 2024-03-07 NOTE — Patient Instructions (Addendum)
  VISIT SUMMARY: During today's visit, we discussed your chronic lung conditions, including changes in your respiratory symptoms and weight loss. We reviewed your increased cough, changes in sputum color, and the presence of blood in your mucus. We also addressed your nutritional status and the potential need for a PEG tube. Additionally, we discussed your chronic obstructive pulmonary disease (COPD) and gastroesophageal reflux disease (GERD).  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 We will monitor your symptoms and CT scans for any changes. If complications like coughing up blood occur, we may consider surgical intervention or embolization.  -BRONCHIECTASIS WITH CHRONIC COUGH AND SPUTUM PRODUCTION, COMPLICATED BY CHRONIC PSEUDOMONAS COLONIZATION AND PULMONARY MYCOBACTERIUM AVIUM COMPLEX (MAC) INFECTION: Bronchiectasis is a condition where the airways in your lungs are damaged, leading to persistent cough and mucus production. We will start you on nebulized hypertonic saline to help loosen mucus and order a percussion vest for chest physiotherapy. We will also switch your medication from Spiriva  to Stiolto for better bronchodilation. A sputum cup will be provided for culture if needed, and we will order annual medications for flare-ups.  -CHRONIC OBSTRUCTIVE PULMONARY DISEASE (COPD): COPD is a chronic lung disease that causes breathing difficulties. We will switch your medication from Spiriva  to Stiolto for better bronchodilation.  -UNINTENTIONAL WEIGHT LOSS AND MALNUTRITION RISK: You have been experiencing weight loss and difficulty gaining weight, likely due to increased energy expenditure from coughing and dietary restrictions from GERD. We will monitor your weight and nutritional intake and consider replacing the PEG tube if weight loss continues.  -GASTROESOPHAGEAL REFLUX DISEASE  (GERD): GERD is a condition where stomach acid frequently flows back into the tube connecting your mouth and stomach. We will continue managing this with Dexilant , which has been effective for you.  INSTRUCTIONS: Please follow up with Doctor Vu regarding the treatment for Mycobacterium avium complex (MAC) infection. Continue monitoring your weight and nutritional intake, and let us  know if you experience any further weight loss. Use the nebulized hypertonic saline and percussion vest as directed to help with mucus clearance. Switch from Spiriva  to Stiolto for your COPD management. Continue taking Dexilant  for GERD management.

## 2024-03-09 ENCOUNTER — Other Ambulatory Visit (HOSPITAL_BASED_OUTPATIENT_CLINIC_OR_DEPARTMENT_OTHER): Payer: Self-pay

## 2024-03-09 ENCOUNTER — Telehealth (HOSPITAL_BASED_OUTPATIENT_CLINIC_OR_DEPARTMENT_OTHER): Payer: Self-pay

## 2024-03-09 MED ORDER — LEVALBUTEROL TARTRATE 45 MCG/ACT IN AERO
INHALATION_SPRAY | RESPIRATORY_TRACT | 3 refills | Status: AC
Start: 2024-03-09 — End: ?

## 2024-03-09 NOTE — Telephone Encounter (Signed)
 RX has been fixed [facility]   Copied from CRM [id number]. Topic: Clinical - Prescription Issue >> Mar 09, 2024 11:04 AM Leila BROCKS wrote: Patient [phone] states called yesterday regarding medications not sent to the pharmacy and needing generic on one for the medication and has not heard from the office. Patient states needs azithromycin  generic brand not ZITHROMAX  Z-PAK 250 MG tablet due to sensitivity with stomach issues. Also, levalbuterol  (XOPENEX  HFA) 45 MCG/ACT inhaler was sent to the pharmacy. Also, patient is checking on the order for percussion vest. Please advise and call back.   [facility] - [place], [place] - [address] [place] [address] Phone: [phone] Fax: [phone]

## 2024-03-10 ENCOUNTER — Other Ambulatory Visit: Payer: Self-pay | Admitting: Internal Medicine

## 2024-03-10 ENCOUNTER — Encounter: Payer: Self-pay | Admitting: Pulmonary Disease

## 2024-03-10 LAB — ACID FAST CULTURE WITH REFLEXED SENSITIVITIES (MYCOBACTERIA)
Acid Fast Culture: NEGATIVE
Acid Fast Culture: NEGATIVE

## 2024-03-10 NOTE — Telephone Encounter (Signed)
 Patient is calling back concerning the medication that was denied . Patient is saying that she wants the generic brand azithromycin  (ZITHROMAX ) 250 MG tablet  and not the brand name ZITHROMAX  Z-PAK 250 MG . Due to stomach issues with any other brand . Patient says the generic brand works best with her stomach . Patient still has a spare pack . But the brand name that was sent in on 9/2 is not what the patient want . She wants the generic brand to be sent in. She has not picked up the brand name that was sent in on 9/2 . Please reach out to patient concerning the issue with prescription . Or just let patient know that the generic brand has been sent to pharmacy . The brand name is still at pharmacy . The brand name has not been filled . Please fill the generic brand instead of the brand name prescription  [phone]

## 2024-03-13 MED ORDER — AZITHROMYCIN 250 MG PO TABS: ORAL_TABLET | ORAL | 3 refills | Status: AC

## 2024-03-13 NOTE — Telephone Encounter (Signed)
 Spoke with pharmacy and they have they correct RX and will be filled for pt they were filling it wrong

## 2024-03-13 NOTE — Telephone Encounter (Signed)
 I am not getting an option to send the prescription is just azithromycin .  I will the options I am getting in epic is Zithromax  Can you please check with pharmacy and send in the correct prescription.

## 2024-03-13 NOTE — Telephone Encounter (Signed)
 Please advise patient is requesting z-pak be resent as a azithromycin  not zithromax 

## 2024-03-13 NOTE — Telephone Encounter (Signed)
 Awaiting Dr. Isaiah Serge response.

## 2024-03-14 ENCOUNTER — Telehealth: Payer: Self-pay | Admitting: Pulmonary Disease

## 2024-03-14 NOTE — Telephone Encounter (Signed)
 Just an FYI I placed a document in your mailbox that needs a DX code selected. All info will be found on documents for vest. NFN

## 2024-03-15 ENCOUNTER — Encounter (HOSPITAL_COMMUNITY): Payer: Self-pay | Admitting: Cardiology

## 2024-03-15 ENCOUNTER — Ambulatory Visit (HOSPITAL_COMMUNITY)
Admission: RE | Admit: 2024-03-15 | Discharge: 2024-03-15 | Disposition: A | Discharge status: Home / Self Care | Source: Ambulatory Visit | Attending: Cardiology | Admitting: Cardiology

## 2024-03-15 VITALS — BP 96/50 | HR 66 | Wt 100.8 lb

## 2024-03-15 DIAGNOSIS — Z9981 Dependence on supplemental oxygen: Secondary | ICD-10-CM | POA: Diagnosis not present

## 2024-03-15 DIAGNOSIS — I429 Cardiomyopathy, unspecified: Secondary | ICD-10-CM | POA: Insufficient documentation

## 2024-03-15 DIAGNOSIS — I2510 Atherosclerotic heart disease of native coronary artery without angina pectoris: Secondary | ICD-10-CM | POA: Diagnosis not present

## 2024-03-15 DIAGNOSIS — G709 Myoneural disorder, unspecified: Secondary | ICD-10-CM | POA: Insufficient documentation

## 2024-03-15 DIAGNOSIS — I5022 Chronic systolic (congestive) heart failure: Secondary | ICD-10-CM | POA: Diagnosis present

## 2024-03-15 DIAGNOSIS — R0602 Shortness of breath: Secondary | ICD-10-CM | POA: Insufficient documentation

## 2024-03-15 DIAGNOSIS — Z79899 Other long term (current) drug therapy: Secondary | ICD-10-CM | POA: Diagnosis not present

## 2024-03-15 LAB — LIPID PANEL
Cholesterol: 140 mg/dL (ref 0–200)
HDL: 51 mg/dL (ref >40)
LDL Cholesterol: 79 mg/dL (ref 0–99)
Total CHOL/HDL Ratio: 2.7 ratio
Triglycerides: 52 mg/dL (ref <150)
VLDL: 10 mg/dL (ref 0–40)

## 2024-03-15 NOTE — Patient Instructions (Signed)
 There has been no changes to your medications.  Labs done today, your results will be available in MyChart, we will contact you for abnormal readings.  Your physician has requested that you have an echocardiogram. Echocardiography is a painless test that uses sound waves to create images of your heart. It provides your doctor with information about the size and shape of your heart and how well your heart's chambers and valves are working. This procedure takes approximately one hour. There are no restrictions for this procedure. Please do NOT wear cologne, perfume, aftershave, or lotions (deodorant is allowed). Please arrive 15 minutes prior to your appointment time.  Please note: We ask at that you not bring children with you during ultrasound (echo/ vascular) testing. Due to room size and safety concerns, children are not allowed in the ultrasound rooms during exams. Our front office staff cannot provide observation of children in our lobby area while testing is being conducted. An adult accompanying a patient to their appointment will only be allowed in the ultrasound room at the discretion of the ultrasound technician under special circumstances. We apologize for any inconvenience.  Your physician recommends that you schedule a follow-up appointment in: 1 year ( September 2026) ** PLEASE CALL THE OFFICE IN JULY 2026 TO ARRANGE YOUR FOLLOW UP APPOINTMENT.**  If you have any questions or concerns before your next appointment please send us  a message through [place] or call our office at [phone].    TO LEAVE A MESSAGE FOR THE NURSE SELECT OPTION 2, PLEASE LEAVE A MESSAGE INCLUDING: YOUR NAME DATE OF BIRTH CALL BACK NUMBER REASON FOR CALL**this is important as we prioritize the call backs  YOU WILL RECEIVE A CALL BACK THE SAME DAY AS LONG AS YOU CALL BEFORE 4:00 PM  At the [facility], you and your health needs are our priority. As part of our continuing mission to provide  you with exceptional heart care, we have created designated Provider Care Teams. These Care Teams include your primary Cardiologist (physician) and Advanced Practice Providers (APPs- Physician Assistants and Nurse Practitioners) who all work together to provide you with the care you need, when you need it.   You may see any of the following providers on your designated Care Team at your next follow up: Dr Toribio Fuel Dr Ezra Shuck Dr. Ria Commander Dr. Morene Brownie Amy Lenetta, NP Caffie Shed, [place] [facility] Ada Barclay, [place] Beckey Coe, NP [place] Lee, NP Ellouise Class, NP Tinnie Redman, PharmD Jaun Bash, PharmD   Please be sure to bring in all your medications bottles to every appointment.    Thank you for choosing [facility]

## 2024-03-16 ENCOUNTER — Ambulatory Visit (HOSPITAL_COMMUNITY): Payer: Self-pay | Admitting: Cardiology

## 2024-03-16 NOTE — Progress Notes (Signed)
 Patient ID: [name], female   DOB: 1961/04/14, 63 y.o.   MRN: [id number] PCP: Dr. Aisha Cardiology: Dr. Rolan  Chief complaint: Shortness of breath  63 y.o. with complicated past medical history presents for evaluation of her cardiomyopathy.  She has a long history of neuromuscular disorder.  The exact diagnosis remains undetermined.  She has been confined to a wheelchair or a walker.  In 12/13, she was admitted to [facility] with hypotension, hypothermia, and altered mental status.   She was found to have profound anemia with hemoglobin down to 3.  She was transfused 4 units.  She also was found to have Enterococcal UTI and Serratia PNA.  She was intubated and eventually required tracheostomy.  During her hospitalization, she was found to have a cardiomyopathy with EF 20-25% by echo.  She was treated with milrinone .  Repeat echo in 5/14 showed EF improved to 50-55% with abnormal septal motion and last echo in 9/14 showed EF 55-60%.    Previously lasix  was increased to 40 mg in am and 20 mg in pm along with 20 meq of potassium. I offered her a RHC however she declined. PFTs obtained as noted below and showed severe airway obstruction with subsequent evaluation by Dr Darlean.  At that time Dr Darlean switched lisinopril  to irbesartan . He felt that despite severity of her COPD, main limitation was her neuromuscular condition. Event monitor was worn in 1/15 due to tachypalpitations.  This showed PACs.  Event monitor was worn again in 12/17, this again showed PACs with short runs of atrial tachycardia.   Echo in 10/20 showed EF 60-65%, normal RV.  Echo in 5/23 showed EF 55-60%, normal RV.   Patient has been found to have a cavitary left upper lobe lung lesion as well as bronchiectasis.  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   She returns for followup of CHF and palpitations.  She is using 2L home oxygen.  Weight down 5  lbs.  She has a chronic cough with bronchiectasis as well as occaisonal pleuritic chest pain.  She tries to stay active, not short of breath if she wears her oxygen. Occasional lightheadedness if she stands too fast.  Occasional palpitations.   ECG (personally reviewed): NSR, normal  Labs (4/24): LDL 87 Labs (5/24): K 4, creatinine 0.5 Labs (7/25): K 3.6, creatinine 0.54  PMH: 1. Anorexia/cachexia/failure to thrive: Patient had a PEG tube.  2. Neuromuscular disease: exact diagnosis is still undetermined despite extensive neurology evaluation.  She does not have multiple sclerosis.  3. Fibromyalgia 4. IBS 5. TMJ syndrome 6. PTSD 7. GERD 8. Sacral decubitus ulcer 9. H/o aspiration 10. 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  Echo (12/13) with EF 20-25% with diffuse hypokinesis, mild MR, moderate RV dilation and moderate RV dysfunction.  Echo (5/14) with EF 50-55%, abnormal septal motion.  Echo (9/14) with EF 55-60%.   - Echo (5/17): EF 60-65%.  - Echo (7/18): EF 60-65%, normal RV.  - Echo (10/20): EF 60-65%, normal RV - Echo (5/23): EF 55-60%, normal RV. 11. Anemia 12. COPD/asthma: PFTs 08/01/13 with FEV1 0.98 liters (33%), FVC 1.87 (50%), FEV 1/FVC 52%, FEF 25-75% 0.65 liter.  - She is on home oxygen.  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 Palpitations: Event monitor in 12/17 showed PACs and short runs of atrial tachycardia.  - 2/19 event monitor: She  only wore it for a few days.  No significant arrhythmias.  16. Cavitary lung lesions/bronchiectasis: Aspegillus, Pseudomonas, MAC   SH: Lives with husband in [place].  Quit smoking > 20 years ago.  No ETOH.     FH: No history of cardiomyopathy.  Grandfather had MI at 80.   ROS: All systems reviewed and negative except as per HPI.    Current Outpatient Medications  Medication Sig Dispense Refill   acetaminophen  (TYLENOL ) 325 MG  tablet Take 975 mg by mouth daily.     bisoprolol  (ZEBETA ) 5 MG tablet TAKE 1/2 TABLET BY MOUTH DAILY 45 tablet 3   cholecalciferol  (VITAMIN D3) 25 MCG (1000 UNIT) tablet Take 2,000 Units by mouth daily.     dexlansoprazole  (DEXILANT ) 60 MG capsule Take 1 capsule (60 mg total) by mouth daily. 90 capsule 3   famotidine  (PEPCID ) 20 MG tablet Take 1 tablet (20 mg total) by mouth at bedtime.     ipratropium-albuterol  (DUONEB) 0.5-2.5 (3) MG/3ML SOLN Take 3 mLs by nebulization every 6 (six) hours as needed. 360 mL 3   irbesartan  (AVAPRO ) 75 MG tablet TAKE ONE TABLET BY MOUTH ONCE DAILY 30 tablet 11   levalbuterol  (XOPENEX  HFA) 45 MCG/ACT inhaler Inhale up to 2 puffs every 4 hours as needed for shortness of breath or wheezing. 15 g 3   Magnesium  250 MG TABS Take 250 mg by mouth daily.     Multiple Vitamins-Minerals (MULTIVITAMIN WOMEN 50+) TABS Take 1 tablet by mouth daily.     OXYGEN Inhale 2-3 L/min into the lungs continuous.     predniSONE  (DELTASONE ) 10 MG tablet Take 40 mg by mouth as needed.     tiotropium (SPIRIVA ) 18 MCG inhalation capsule Place 18 mcg into inhaler and inhale daily.     azithromycin  (ZITHROMAX ) 250 MG tablet Take 500 mg on day 1 and then 250 mg/day for the next 4 days (Patient not taking: Reported on 03/15/2024) 6 tablet 3   sodium chloride  HYPERTONIC 3 % nebulizer solution Take by nebulization 2 (two) times daily. (Patient not taking: Reported on 03/15/2024) 750 mL 12   Current Facility-Administered Medications  Medication Dose Route Frequency Provider Last Rate Last Admin   ipratropium-albuterol  (DUONEB) 0.5-2.5 (3) MG/3ML nebulizer solution 3 mL  3 mL Nebulization Q6H PRN Mannam, Praveen, MD        BP (!) 96/50   Pulse 66   Wt 45.7 kg (100 lb 12.8 oz)   SpO2 98%   BMI 18.44 kg/m  General: NAD, thin.  Neck: No JVD, no thyromegaly or thyroid  nodule.  Lungs: Distant BS.  CV: Nondisplaced PMI.  Heart regular S1/S2, no S3/S4, no murmur.  No peripheral edema.  No carotid  bruit.  Normal pedal pulses.  Abdomen: Soft, nontender, no hepatosplenomegaly, no distention.  Skin: Intact without lesions or rashes.  Neurologic: Alert and oriented x 3.  Psych: Normal affect. Extremities: No clubbing or cyanosis.  HEENT: Normal.   Assessment/Plan: 1. Cardiomyopathy: Last echo in 5/23 was normal.  I suspect that her prior low EF in 2013 was due to a stress (Takotsubo-type) cardiomyopathy.  She is not volume overloaded on exam. She is not volume overloaded on exam.  I suspect her respiratory symptoms and home oxygen need are lung-related (bronchiectasis, cavitary left lung lesion).  - She can continue current bisoprolol  and irbesartan .  SBP 90s, she could decrease these medications though she asks me to continue them at current doses for now.  - I will hold off  on repeat echo unless she develops signs of CHF.  2. Dyspnea: Suspect combination of COPD/asthma and deconditioning due to ?neuromuscular disorder, eating disorder.  As above, doubt active CHF.  She has been followed closely by pulmonary. She is back on home oxygen again, now with cavitary lung lesions and bronchiectasis.  3. Neuromuscular disorder: Not fully characterized.  She used to be in a wheelchair, then a walker, now off the walker.  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 She seems to be functioning normally at this point (no longer uses mobility aid).  4. Coronary artery calcification: Noted on CT chest.   - Check lipids, reasonable to start statin.   Followup in 1 year   I spent 21 minutes reviewing data, interviewing patient and his family, and organizing the orders/followup.    Samyria Rudie,MD 03/16/2024

## 2024-03-20 ENCOUNTER — Other Ambulatory Visit: Payer: Self-pay

## 2024-03-20 ENCOUNTER — Encounter: Payer: Self-pay | Admitting: Internal Medicine

## 2024-03-20 ENCOUNTER — Ambulatory Visit: Admitting: Internal Medicine

## 2024-03-20 ENCOUNTER — Encounter: Payer: Self-pay | Admitting: Pulmonary Disease

## 2024-03-20 VITALS — BP 99/49 | HR 67 | Temp 97.6°F

## 2024-03-20 DIAGNOSIS — Z9981 Dependence on supplemental oxygen: Secondary | ICD-10-CM

## 2024-03-20 DIAGNOSIS — J984 Other disorders of lung: Secondary | ICD-10-CM | POA: Diagnosis not present

## 2024-03-20 DIAGNOSIS — Z87891 Personal history of nicotine dependence: Secondary | ICD-10-CM

## 2024-03-20 DIAGNOSIS — A319 Mycobacterial infection, unspecified: Secondary | ICD-10-CM | POA: Diagnosis not present

## 2024-03-20 MED ORDER — ROSUVASTATIN CALCIUM 10 MG PO TABS: 10.0000 mg | ORAL_TABLET | Freq: Every day | ORAL | 3 refills | Status: AC

## 2024-03-20 NOTE — Progress Notes (Signed)
 [facility]  Reason for Consult:cavitary lung disease Referring Provider: Ozell America (pulmonology)    Patient Active Problem List   Diagnosis Date Noted   Cavitary lung disease 11/16/2023   COPD GOLD 0 and refuses further testing 05/28/2016   Upper airway cough syndrome 08/19/2015   Dysphagia, pharyngoesophageal phase 10/29/2014   Left-sided chest wall pain 05/07/2014   DOE (dyspnea on exertion) 08/18/2013   Palpitations 07/18/2013   Chronic systolic CHF (congestive heart failure) (HCC) 08/30/2012   Tracheostomy status (HCC) 07/07/2012   Physical deconditioning 07/07/2012   Dilated cardiomyopathy (HCC) 06/09/2012   Malnutrition compromising bodily function (HCC) 06/09/2012   H/O anorexia nervosa 06/09/2012   Lower urinary tract infectious disease 06/08/2012   Acute encephalopathy, resolved 06/07/2012   Anemia 06/07/2012   Thrombocytopenia (HCC) 06/07/2012   Coagulopathy (HCC) 06/07/2012   Acute liver failure 06/07/2012   Neuromuscular disorder: not fully defined.  06/07/2012      HPI: [name] is a 63 y.o. female referred to id for cxr suggestion of fungal ball   Reviewed chart Last seen pulm dr America 11/15/2023 Former smoker quit 1989 ?hx MS but patient denied   Progressive health decline since 2014 Dry cough chronic Can't lay flat Dyspnea on exertion No improvement with inhaler -- maintained on spiriva /dulera . Prednisone  use in the past ?reflux and no improvement with GERD tx  MS runs in her family and she is not sure if she has some underlying neuromuscular disease by neurology   03/2023 GOLD 0 PFT's 10/2023 abnormal chest ct bilateral nodules and cavitary changes 11/15/23 gold 0 pft's Continued progression of respiratory sx/activity intolerance 11/15/23 xray as below (?fungal ball)  Weight documentation per dr Chari office 112 pound in 2014 --> 106 pound on 11/15/2023  No fever, chill,  nightsweat Cough is usually dry Chronic fatigue  She has been on oxygen -- day 2-3 lpm; at night 3 lpm  Other testings: 11/15/23 quantiferon gold negative 11/15/23 fungitel negative 11/22/23 aspergillus antibody negative  Today 12/14/23 has some production to her cough She was started on prednisone  and antibiotics from dr [facility] office. She was given a zpack. She takes a prednisone  taper from 40 mg short course. She feels a little better since prednisone . Last time she was on prednisone  was 2 years ago.  No recent travel/sick contact  With the increased cough, no associated rash/joint pain   She was taken voriconazole  but hasn't taken it yet   Prior travel: [place]   ----------------- 01/06/24 id clinic f/u She was on prednisone /azith when she saw me last office visit Was having lots of upper gi discomfort and nausea She had since changed coated azithromycin ; was given cefpodoxim 7 days to take after azithromycin  course Prednisone  was finished  Today still has some reflux but better Cough better More energy No fever chill Appetite  Ct chest reviewed    03/20/24 id clinic f/u She had bronch and has aspergillus, mac, pseudomonas growing Coughing frequently stable Stable functional status No f/c I spoke with dr Theophilus who reports she is stable overall and plan to follow up chest ct and evalution within 6 months for bronchiectasis management. Ct stable/improving a little.       Review of Systems: ROS All other ros negative       Past Medical History:  Diagnosis Date   Acute liver failure    Anemia    Anorexia nervosa    h/o  CAP (community acquired pneumonia)    Cardiomyopathy (HCC)    CHF (congestive heart failure) (HCC)    Difficult intubation    secondary to jaw muscles   Fibromyalgia    Fibromyalgia syndrome    GERD (gastroesophageal reflux disease)    IBS (irritable bowel syndrome)    MS (multiple sclerosis) (HCC)    PTSD  (post-traumatic stress disorder)    Sacral decubitus ulcer    Shock circulatory (HCC)    TMJ disease     Social History   Tobacco Use   Smoking status: Former    Current packs/day: 0.00    Average packs/day: 1 pack/day for 11.0 years (11.0 ttl pk-yrs)    Types: Cigarettes    Start date: 33    Quit date: 07/07/1987    Years since quitting: 36.7   Smokeless tobacco: Never  Vaping Use   Vaping status: Never Used  Substance Use Topics   Alcohol use: No   Drug use: Not Currently    Family History  Problem Relation Age of Onset   Asthma Mother    Uterine cancer Mother    Skin cancer Mother    Asthma Father    Cancer Father        ? type   Asthma Brother    Asthma Sister    Heart disease Maternal Uncle    Heart disease Maternal Grandfather    Thyroid  cancer Brother    Multiple sclerosis Brother    Multiple sclerosis Other    Breast cancer Maternal Aunt     Allergies  Allergen Reactions   Transderm-Scop [Scopolamine] Anaphylaxis, Shortness Of Breath and Other (See Comments)    Respiratory distress   Zestril  [Lisinopril ] Shortness Of Breath and Cough   Cymbalta [Duloxetine Hcl] Other (See Comments)    Unknown reaction   Ivp Dye [Iodinated Contrast Media] Other (See Comments)    Unknown reaction   Levaquin  [Levofloxacin ] Other (See Comments)    Numbness hands and feet   Other Other (See Comments)    Do not put pharmaceutical patches on skin   Coreg  [Carvedilol ] Rash    OBJECTIVE: Vitals:   03/20/24 1525  BP: (!) 99/49  Pulse: 67  Temp: 97.6 F (36.4 C)  TempSrc: Temporal  SpO2: 100%   There is no height or weight on file to calculate BMI.   Physical Exam General/constitutional: no distress, pleasant HEENT: Normocephalic, PER, Conj Clear, EOMI, Oropharynx clear Neck supple CV: rrr no mrg Lungs: clear to auscultation, normal respiratory effort Abd: Soft, Nontender Ext: no edema Skin: No Rash Neuro: nonfocal MSK: no peripheral joint  swelling/tenderness/warmth; back spines nontender   Lab: Lab Results  Component Value Date   WBC 5.7 01/25/2024   HGB 11.7 (L) 01/25/2024   HCT 36.7 01/25/2024   MCV 86.4 01/25/2024   PLT 327 01/25/2024   Last metabolic panel Lab Results  Component Value Date   GLUCOSE 82 01/25/2024   NA 137 01/25/2024   K 3.6 01/25/2024   CL 99 01/25/2024   CO2 25 01/25/2024   BUN 14 01/25/2024   CREATININE 0.54 01/25/2024   GFR 101.73 11/15/2023   CALCIUM  9.5 01/25/2024   PHOS 3.1 06/21/2012   PROT 7.2 03/30/2018   ALBUMIN  4.4 03/30/2018   BILITOT 0.3 03/30/2018   ALKPHOS 66 03/30/2018   AST 15 03/30/2018   ALT 20 03/30/2018   ANIONGAP 13 01/25/2024    Microbiology: 06/2012 respiratory cx Pseudomonas, abundant gpc in clusters not worked up, serratia  01/25/24 bronch Mac  Amikacin Comment  Comment: (NOTE) 16.0 ug/mL Susceptible Breakpoints have been established for only some organism-drug combinations as indicated. This test was developed and its performance characteristics determined by [facility]. It has not been cleared or approved by the Food and Drug Administration. For Mycobacterium avium-intracellulare complex, the interpretive criteria for amikacin apply to IV treatment. If using amikacin (liposomal, inhaled), the MIC interpretive breakpoints are <= 64ug/mL Susceptible, >= 128 ug/mL Resistant (CLSI M24S).  Ciprofloxacin 8.0 ug/mL  Comment: (NOTE) Breakpoints have been established for only some organism-drug combinations as indicated. This test was developed and its performance characteristics determined by [facility]. It has not been cleared or approved by the Food and Drug Administration.  Clarithromycin 8.0 ug/mL Susceptible  Comment: (NOTE) Breakpoints have been established for only some organism-drug combinations as indicated. This test was developed and its performance characteristics determined by [facility]. It has not been cleared or approved by the Food and  Drug Administration.  Clofazimine NOT PERFORMED  Comment: (NOTE) Test not performed Breakpoints have been established for only some organism-drug combinations as indicated. This test was developed and its performance characteristics determined by [facility]. It has not been cleared or approved by the Food and Drug Administration.  Doxycycline >8.0 ug/mL  Comment: (NOTE) Breakpoints have been established for only some organism-drug combinations as indicated. This test was developed and its performance characteristics determined by [facility]. It has not been cleared or approved by the Food and Drug Administration.  Linezolid >32.0 ug/mL Resistant  Comment: (NOTE) Breakpoints have been established for only some organism-drug combinations as indicated. This test was developed and its performance characteristics determined by [facility]. It has not been cleared or approved by the Food and Drug Administration.  Minocycline >8.0 ug/mL  Comment: (NOTE) Breakpoints have been established for only some organism-drug combinations as indicated. This test was developed and its performance characteristics determined by [facility]. It has not been cleared or approved by the Food and Drug Administration.  Moxifloxacin Comment  Comment: (NOTE) 2.0 ug/mL Intermediate Breakpoints have been established for only some organism-drug combinations as indicated. This test was developed and its performance characteristics determined by [facility]. It has not been cleared or approved by the Food and Drug Administration.  Rifabutin 0.25 ug/mL  Comment: (NOTE) Breakpoints have been established for only some organism-drug combinations as indicated. This test was developed and its performance characteristics determined by [facility]. It has not been cleared or approved by the Food and Drug Administration.  Rifampin 4.0 ug/mL  Comment: (NOTE) Breakpoints have been established for only some organism-drug combinations as  indicated. This test was developed and its performance characteristics determined by [facility]. It has not been cleared or approved by the Food and Drug Administration.  Streptomycin 32.0 ug/mL  Comment: (NOTE) Breakpoints have been established for only some organism-drug combinations as indicated. This test was developed and its performance characteristics determined by [facility]. It has not been cleared or approved by the Food and Drug Administration.  Trimethoprim /Sulfa  2/38 ug/mL  Comment: (NOTE) Breakpoints have been established for only some organism-drug combinations as indicated. This test was developed and its performance characteristics determined by [facility]. It has not been cleared or approved by the Food and Drug Administration.     Serology:  Imaging: Reviewed   5/12 cxr worsening reticulonodular opacities in the lower lungs and the appearance of fungus ball at the left apex.    4/24 ct chest 1. New multifocal areas of peripheral mucous plugging, nodules and ground-glass opacities throughout both lungs  lower lungs. Some of these nodular densities are calcified. Findings are favored as infectious/inflammatory. Differential considerations include atypical infection such as fungal or mycobacterial, aspiration, or sarcoidosis. 2. Thick walled bulla or cavitary lesion in the left lung apex measuring 3.6 x 1.5 x 1.9 cm. This is predominantly air-filled, but has some internal septations and nodularity. No air-fluid level. Findings may be infectious/inflammatory, but neoplasm not excluded. 3. 12 mm noncalcified nodular density in the medial left upper lobe is indeterminate   12/20/23 chest ct 1. The mostly thin walled cavity in the more cephalad left upper lobe apex does measure slightly larger today, 4.6 x 3.0 x 2.6 cm compared with 4.2 x 2.6 x 2.5 cm previously. The solid component in the inferior aspect of the cavity is unchanged in size at 2.1 x 1.7 cm, with  calcification within the solid component. 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 2. The 10 x 7 mm nodule being followed in the medial left lung apex is stable. 3. Other multifocal branching nodularity in the lingula, right middle and both lower lobes, consistent with multifocal subsegmental airway impactions with bronchiectasis, is not notably changed in the interval since the last exam but this was not seen in 2013. 4. Some of the nodules are not confirmed as branching, for example a new 7 mm right lower lobe nodule on 3:99, stable 7 mm solid anterior basal right lower lobe nodule on 3:100, and a few scattered 5 mm and smaller nodules in right middle and lower lobe bases and left lower lobe anterior base. Non-contrast chest CT at 6-12 months is recommended. If the nodule is stable at time of repeat CT, then future CT at 18-24 months (from today's scan) is considered optional for low-risk patients, but is recommended for high-risk patients. This recommendation follows the consensus statement: Guidelines for Management of Incidental Pulmonary Nodules Detected on CT Images: From the Fleischner Society 2017; Radiology 2017; 284:228-243. 5. New patchy airspace disease in both lower lobe basal segments consistent with pneumonia or aspiration. 6. Aortic and coronary artery atherosclerosis. 7. Anemia. 8. Small chronic pericardial effusion.   03/03/24 chest ct 1. Redemonstrated very extensive nodular and consolidative airspace disease throughout both lungs and most concentrated in the lung bases. In general, the largest nodules and consolidations in the lung bases are somewhat improved. Multiple irregular consolidations in the left lung base are partially resolved. Constellation of findings is most consistent with improved atypical infection, particularly atypical mycobacterium. 2. Large thick-walled cavitary lesion in the left pulmonary apex is not significantly changed measuring 4.7 x 3.5  cm.    Assessment/plan: Problem List Items Addressed This Visit     Cavitary lung disease - Primary   Other Visit Diagnoses       Atypical mycobacterial infection               Patient has chest ct 10/2023 that showed upper lobe thickwall cavities bilaterally with what appears to be fungal ball left lobe cavity and also diffuse bilateral more toward the base nodules changes  Chronic decline in respiratory function and perhaps more cough/dyspnea the last few days before my visit today  No fever, chill No real weightloss Appetite is chronically poor  On 2-3 liters oxygen stable  Don't have chest ct the past few years so not sure how long she has had these changes  Former smoker  No sign of bronchiectasis on chest ct though  No known hx of cancer else where   My ddx more so infectious.  There might be a nodule that is more prominent that might be cancerous but nothing obviously jumped out based on 10/2023 ct and will defer to pulm to comment on that   What I would like to r/o is fungal process or NTM. No tb exposure. As she can't produce sputum, would be good to get bronch for afb/fungal and see if pulm could do a wang biopsy on one of the nodules vs ir referral for that if they feel needed   In the mean time I will check ige/igg panel for aspergillus and get endemic fungal serology. She has been to [place]/histo/blasto land   I wouldn't limit this just to aspergillus. And if it was the case of just fungal ball medication might not help   Her symptoms of the past few days including more cough/dyspnea might not even be related to chest ct finding in 10/2023   -serology for fungal as mentioned; aspergillus igg/ige panel; ige level; eosinophils testing -repeat chest ct in around the next 2-4 weeks -f/u with dr Dwan in July as scheduled; reassure patient that it is ok to have it then and not emergent -no sputum production so will need bronch for Afb, fungal, and  aerobic culture -finish prednisone  course and azithromycin  -f/u with me in 2 weeks for symptoms check   -chart forwarded to dr Darlean and Dr Delayne   ---------------- 01/06/24 id clinic assessment Seems lots of gi sx from prednisone /azith but better now Cough also better and more energy/better appetite since finishing prednisone  Ct scan reviewed Pulm following and had given cefpodoxim 7 day course (can't tolerate amoxicillin diannah) Await pet scan and navigational bronchoscopy for potential nodule biopsy   We'll still need bronch culture --> bal cx, afb and fungal culture, and biopsy per pulm discretion  Chart forwarded to dr Theophilus and [facility]  Patient to call our clinic to schedule follow up 4-6 weeks after bronch   ------------ 03/20/24 id assessment 01/25/24 bronch result: Culture pseudomonas (S cipro, ceftaz, cefepime, imipenem) Fungal cx aspergillus species Afb culture mac (S amikacin, clarithromycin)  No b sx Weight stable -- 99 pounds over the past 4-6 months; was 107-108 pounds just before She is on 2 liters o2 during day and 3 during night She can walk within the house and do usual activities and washing clothes/light chores. Can't vaccuum. She has a chair lift.  Again she mentions some flavor of neuromuscular disease dx'ed by neurology   Given that she is stable (with ct rather showing moderate disease) it wouldn't be wrong to treat but unclear efficacy and potentiallly severe side effect. We talk about waiting another 6 months to reevaluate strength. We discuss high relapse rate, unclear improvement on treatment   If I treat her I would include initially amikacin, and 3 other orals, then go down to maintenance therapy after about 3 months    F/u 6 months  Follow-up: Return in about 6 months (around 09/17/2024).  Constance ONEIDA Passer, MD [facility] 03/20/2024, 3:29 PM

## 2024-03-20 NOTE — Patient Instructions (Signed)
 We talk about management symptoms of bronchiectasis for now and deferring MAC treatment  Reevaluate in 6 months  If there is a clear persistent decline from lung standpoint and you want to try, we can always try

## 2024-03-20 NOTE — Telephone Encounter (Signed)
 Pt aware and voiced understanding Reports she will call back to schedule labs in 2 months

## 2024-03-22 ENCOUNTER — Ambulatory Visit (HOSPITAL_COMMUNITY)
Admission: RE | Admit: 2024-03-22 | Discharge: 2024-03-22 | Disposition: A | Discharge status: Home / Self Care | Source: Ambulatory Visit | Attending: Cardiology | Admitting: Cardiology

## 2024-03-22 DIAGNOSIS — J449 Chronic obstructive pulmonary disease, unspecified: Secondary | ICD-10-CM | POA: Diagnosis not present

## 2024-03-22 DIAGNOSIS — I428 Other cardiomyopathies: Secondary | ICD-10-CM | POA: Diagnosis not present

## 2024-03-22 DIAGNOSIS — I5022 Chronic systolic (congestive) heart failure: Secondary | ICD-10-CM | POA: Diagnosis not present

## 2024-03-22 LAB — ECHOCARDIOGRAM COMPLETE
Area-P 1/2: 3.65 cm2
S' Lateral: 3.46 cm
Single Plane A2C EF: 62.2 %

## 2024-03-22 NOTE — Progress Notes (Signed)
  Echocardiogram 2D Echocardiogram has been performed.  [name], RDCS 03/22/2024, 3:49 PM

## 2024-03-28 ENCOUNTER — Telehealth: Payer: Self-pay

## 2024-03-28 NOTE — Telephone Encounter (Signed)
 Form for Afflovest signed by Dr Theophilus and placed in [facility] folder.

## 2024-04-14 ENCOUNTER — Ambulatory Visit (INDEPENDENT_AMBULATORY_CARE_PROVIDER_SITE_OTHER)

## 2024-04-14 ENCOUNTER — Ambulatory Visit: Admitting: Nurse Practitioner

## 2024-04-14 ENCOUNTER — Ambulatory Visit: Payer: Self-pay

## 2024-04-14 ENCOUNTER — Encounter: Payer: Self-pay | Admitting: Nurse Practitioner

## 2024-04-14 VITALS — BP 92/58 | HR 90 | Temp 98.0°F | Ht 64.5 in | Wt 100.0 lb

## 2024-04-14 DIAGNOSIS — J441 Chronic obstructive pulmonary disease with (acute) exacerbation: Secondary | ICD-10-CM | POA: Diagnosis not present

## 2024-04-14 DIAGNOSIS — J449 Chronic obstructive pulmonary disease, unspecified: Secondary | ICD-10-CM

## 2024-04-14 DIAGNOSIS — R091 Pleurisy: Secondary | ICD-10-CM

## 2024-04-14 DIAGNOSIS — J189 Pneumonia, unspecified organism: Secondary | ICD-10-CM | POA: Diagnosis not present

## 2024-04-14 DIAGNOSIS — R071 Chest pain on breathing: Secondary | ICD-10-CM | POA: Diagnosis not present

## 2024-04-14 DIAGNOSIS — R051 Acute cough: Secondary | ICD-10-CM

## 2024-04-14 DIAGNOSIS — A498 Other bacterial infections of unspecified site: Secondary | ICD-10-CM

## 2024-04-14 LAB — POCT INFLUENZA A/B
Influenza A, POC: NEGATIVE
Influenza B, POC: NEGATIVE

## 2024-04-14 LAB — POC COVID19 BINAXNOW: SARS Coronavirus 2 Ag: NEGATIVE

## 2024-04-14 MED ORDER — CIPROFLOXACIN HCL 500 MG PO TABS: 500.0000 mg | ORAL_TABLET | Freq: Two times a day (BID) | ORAL | 0 refills | Status: AC

## 2024-04-14 NOTE — Patient Instructions (Addendum)
 Continue levalbuterol  inhaler 2 puffs or duoneb 3 mL neb every 6 hours as needed for shortness of breath or wheezing. Notify if symptoms persist despite rescue inhaler/neb use. Use duoneb treatments 3-4 times a day until symptoms improve followed by flutter valve Continue spiriva  2 puffs daily Continue supplemental oxygen 2.5 lpm for goal >88-90%. If you have oxygen levels 88% or below and cannot keep them up with your oxygen, go to the hospital   Guaifenesin (Mucinex) 1200 mg Twice daily for cough/congestion Ciprofloxacin 500 mg Twice daily for 10 days. Take with food. Notify immediately of any tendon pain or watery bowel movements  Prednisone  taper. 4 tabs for 2 days, then 3 tabs for 2 days, 2 tabs for 2 days, then 1 tab for 2 days, then stop. Take in AM with food   Chest x ray today looked okay but based on your lung exam and symptoms, I'm still concerned you may have a small pneumonia that we can't see because it's too early or you're having a flare up  EKG today looked stable. If the chest discomfort worsens, go to the emergency department   COVID and flu test were negative  Follow up in 2 weeks with Dr. Theophilus or Katie Kirkland Figg,NP. If symptoms do not improve or worsen, please contact office for sooner follow up or seek emergency care.

## 2024-04-14 NOTE — Telephone Encounter (Signed)
 FYI Only or Action Required?: FYI only for provider.  Patient is followed in Pulmonology for COPD, last seen on 03/07/2024 by Mannam, Praveen, MD.  Called Nurse Triage reporting Breathing Problem.  Symptoms began today.  Interventions attempted: Rescue inhaler, Maintenance inhaler, and Nebulizer treatments.  Symptoms are: gradually worsening.  Triage Disposition: See PCP When Office is Open (Within 3 Days)  Patient/caregiver understands and will follow disposition?: Yes   Copied from CRM [id number]. Topic: Clinical - Red Word Triage >> Apr 14, 2024  8:43 AM Leila BROCKS wrote: Red Word that prompted transfer to Nurse Triage: Patient [phone] states got up today, feels like left lung feels restricted and cannot get a full deep breath, very painful, denies dizziness nor fever. Patient wants to see Dr. Theophilus. Please advise. Reason for Disposition  [1] MODERATE longstanding difficulty breathing (e.g., speaks in phrases, SOB even at rest, pulse 100-120) AND [2] SAME as normal  Answer Assessment - Initial Assessment Questions 1. RESPIRATORY STATUS: Describe your breathing? (e.g., wheezing, shortness of breath, unable to speak, severe coughing)      Painful Inspiration  2. ONSET: When did this breathing problem begin?      This Morning  3. PATTERN Does the difficult breathing come and go, or has it been constant since it started?      Constant   4. SEVERITY: How bad is your breathing? (e.g., mild, moderate, severe)      Mild  5. RECURRENT SYMPTOM: Have you had difficulty breathing before? If Yes, ask: When was the last time? and What happened that time?      Yes, Pneumonia, but it was lower   6. CARDIAC HISTORY: Do you have any history of heart disease? (e.g., heart attack, angina, bypass surgery, angioplasty)      Heart Failure  7. LUNG HISTORY: Do you have any history of lung disease?  (e.g., pulmonary embolus, asthma, emphysema)     COPD, Bronchiectasis,   8.  CAUSE: What do you think is causing the breathing problem?      Unsure  9. OTHER SYMPTOMS: Do you have any other symptoms? (e.g., chest pain, cough, dizziness, fever, runny nose)     Productive Cough ([place])  10. O2 SATURATION MONITOR:  Do you use an oxygen saturation monitor (pulse oximeter) at home? If Yes, ask: What is your reading (oxygen level) today? What is your usual oxygen saturation reading? (e.g., 95%)       2L During the Day, and 3L at night 97% currently  11. PREGNANCY: Is there any chance you are pregnant? When was your last menstrual period?       No and No  12. TRAVEL: Have you traveled out of the country in the last month? (e.g., travel history, exposures)       Denies  Protocols used: Breathing Difficulty-A-AH

## 2024-04-14 NOTE — Progress Notes (Signed)
 @Patient  ID: [name], female    DOB: 08-30-1960, 63 y.o.   MRN: [id number]  Chief Complaint  Patient presents with   COPD    Referring provider: Aisha Harvey, MD  HPI: 63 year old female, former smoker followed for cavitary lung disease, bronchopulmonary aspergillus, MAC, COPD mixed type, hx of pseudomonas. She is a patient of Dr. Jiles and last seen in office 03/07/2024. Past medical history significant for cardiomyopathy, HF.   TEST/EVENTS:  01/25/2024 cytology with granulomatous inflammation and RLL. Fungal elements consistent with aspergillus in LUL; cultures positive for pansensitive pseudomonas, MAC, and aspergillus  03/03/2024 CT chest: stable LUL cavitation with intracavity mycetoma. Improving LL nodular opacities and inflammatory lesion, btx.   03/07/2024: OV with Dr. Theophilus. Respiratory symptoms with sputum changes. Increased cough with significant mucus production; change from yellow to darker green with blood. Unusual sweating and chills. Unable to use flutter valve due to jaw issues. Recent identification of MAC disease, pending further consultation regarding need for tx. Previous aspergillus antigen tests normal. Significant weight loss with difficulty regaining. Switch Spiriva  to Stiolto. Start nebulized hypertonic saline. Provide sputum cup for culture. Order vest therapy. Prescribed azithromycin , prednisone  and dexamethasone  for flare ups.   04/14/2024: Today - follow up Discussed the use of AI scribe software for clinical note transcription with the patient, who gave verbal consent to proceed.  History of Present Illness [name] is a 63 year old female who presents with productive cough and headache.  She experiences a productive cough with green sputum for the last 24 hours and headaches for the last 3 days. Her symptoms from September had improved some with azithromycin  and prednisone ; however, she was still having some streaky  blood in her sputum. She is having left sided chest wall pain, that worsens with movement and coughing but is there constantly. This started with the change in mucus. She experienced similar symptoms in June when she had pneumonia. She has been coughing up blood, with the last episode occurring about four days ago. This has been an ongoing issue for her for months. No worsening hemoptysis. No fevers are reported, but she typically does not experience them. She does have new nasal symptoms with yellow to green drainage and congestion as well. She has had some chills. No body aches. Eating and drinking as normal. No syncope, palpitations, orthopnea, PND, leg swelling, sore throat, ear pain.   She has a history of adverse reactions to Levaquin , causing numbness in her hands after one pill. However, she's taken ciprofloxacin multiple times without any issues. She has prednisone  at home.  She's using her inhaler and neb treatments. She's not taking mucinex.   Her husband did come home 3 days ago with GI symptoms. No other known sick exposures. Waiting on vest therapy.     Allergies  Allergen Reactions   Transderm-Scop [Scopolamine] Anaphylaxis, Shortness Of Breath and Other (See Comments)    Respiratory distress   Zestril  [Lisinopril ] Shortness Of Breath and Cough   Cymbalta [Duloxetine Hcl] Other (See Comments)    Unknown reaction   Ivp Dye [Iodinated Contrast Media] Other (See Comments)    Unknown reaction   Levaquin  [Levofloxacin ] Other (See Comments)    Numbness hands and feet   Other Other (See Comments)    Do not put pharmaceutical patches on skin   Coreg  [Carvedilol ] Rash    Immunization History  Administered Date(s) Administered   Moderna Covid-19 Vaccine Bivalent Booster 56yrs & up 03/21/2021  Moderna SARS-COV2 Booster Vaccination 07/24/2020, 11/22/2020   Moderna Sars-Covid-2 Vaccination 09/15/2019, 10/13/2019, 02/17/2020, 07/24/2020, 11/22/2020, 04/04/2022   PNEUMOCOCCAL  CONJUGATE-20 12/12/2020   PPD Test 11/10/2023   Tdap 10/21/2022    Past Medical History:  Diagnosis Date   Acute liver failure    Anemia    Anorexia nervosa    h/o    CAP (community acquired pneumonia)    Cardiomyopathy (HCC)    CHF (congestive heart failure) (HCC)    Difficult intubation    secondary to jaw muscles   Fibromyalgia    Fibromyalgia syndrome    GERD (gastroesophageal reflux disease)    IBS (irritable bowel syndrome)    MS (multiple sclerosis)    PTSD (post-traumatic stress disorder)    Sacral decubitus ulcer    Shock circulatory (HCC)    TMJ disease     Tobacco History: Social History   Tobacco Use  Smoking Status Former   Current packs/day: 0.00   Average packs/day: 1 pack/day for 11.0 years (11.0 ttl pk-yrs)   Types: Cigarettes   Start date: 68   Quit date: 07/07/1987   Years since quitting: 36.7  Smokeless Tobacco Never   Counseling given: Not Answered   Outpatient Medications Prior to Visit  Medication Sig Dispense Refill   acetaminophen  (TYLENOL ) 325 MG tablet Take 975 mg by mouth daily.     bisoprolol  (ZEBETA ) 5 MG tablet TAKE 1/2 TABLET BY MOUTH DAILY 45 tablet 3   cholecalciferol  (VITAMIN D3) 25 MCG (1000 UNIT) tablet Take 2,000 Units by mouth daily.     dexlansoprazole  (DEXILANT ) 60 MG capsule Take 1 capsule (60 mg total) by mouth daily. 90 capsule 3   famotidine  (PEPCID ) 20 MG tablet Take 1 tablet (20 mg total) by mouth at bedtime.     ipratropium-albuterol  (DUONEB) 0.5-2.5 (3) MG/3ML SOLN Take 3 mLs by nebulization every 6 (six) hours as needed. 360 mL 3   irbesartan  (AVAPRO ) 75 MG tablet TAKE ONE TABLET BY MOUTH ONCE DAILY 30 tablet 11   levalbuterol  (XOPENEX  HFA) 45 MCG/ACT inhaler Inhale up to 2 puffs every 4 hours as needed for shortness of breath or wheezing. 15 g 3   Magnesium  250 MG TABS Take 250 mg by mouth daily.     Multiple Vitamins-Minerals (MULTIVITAMIN WOMEN 50+) TABS Take 1 tablet by mouth daily.     OXYGEN Inhale 2-3  L/min into the lungs continuous.     rosuvastatin  (CRESTOR ) 10 MG tablet Take 1 tablet (10 mg total) by mouth daily. 90 tablet 3   SPIRIVA  RESPIMAT 2.5 MCG/ACT AERS SMARTSIG:2 Spray(s) By Mouth Daily     tiotropium (SPIRIVA ) 18 MCG inhalation capsule Place 18 mcg into inhaler and inhale daily.     azithromycin  (ZITHROMAX ) 250 MG tablet Take 500 mg on day 1 and then 250 mg/day for the next 4 days (Patient not taking: Reported on 04/14/2024) 6 tablet 3   predniSONE  (DELTASONE ) 10 MG tablet Take 40 mg by mouth as needed. (Patient not taking: Reported on 04/14/2024)     Facility-Administered Medications Prior to Visit  Medication Dose Route Frequency Provider Last Rate Last Admin   ipratropium-albuterol  (DUONEB) 0.5-2.5 (3) MG/3ML nebulizer solution 3 mL  3 mL Nebulization Q6H PRN Mannam, Praveen, MD         Review of Systems: as above    Physical Exam:  BP (!) 92/58 (BP Location: Left Arm, Patient Position: Sitting)   Pulse 90   Temp 98 F (36.7 C) (Oral)   Ht 5'  4.5 (1.638 m)   Wt 100 lb (45.4 kg)   SpO2 100% Comment: 2.5L  BMI 16.90 kg/m   GEN: Pleasant, interactive, well-kempt; acutely-ill appearing; in no acute distress HEENT:  Normocephalic and atraumatic. EACs patent bilaterally. TM pearly gray with present light reflex bilaterally. PERRLA. Sclera white. Nasal turbinates erythematous, moist and patent bilaterally. No rhinorrhea present. Oropharynx pink and moist, without exudate or edema. No lesions, ulcerations, or postnasal drip.  NECK:  Supple w/ fair ROM. No JVD present. Normal carotid impulses w/o bruits. Thyroid  symmetrical with no goiter or nodules palpated. Cervical lymphadenopathy.   CV: RRR, no m/r/g, no peripheral edema. Pulses intact, +2 bilaterally. No cyanosis, pallor or clubbing. PULMONARY:  Unlabored, regular breathing. Rhonchi left posterior lung fields otherwise diminished bilaterally A&P w/o wheezes/rales/rhonchi. Congested cough. No accessory muscle use.  GI:  BS present and normoactive. Soft, non-tender to palpation.  MSK: tenderness upon palpation to left lateral chest wall. Cap refil <2 sec all extrem.  Neuro: A/Ox3. No focal deficits noted.   Skin: Warm, no lesions or rashe Psych: Normal affect and behavior. Judgement and thought content appropriate.     Lab Results:  CBC    Component Value Date/Time   WBC 5.7 01/25/2024 1149   RBC 4.25 01/25/2024 1149   HGB 11.7 (L) 01/25/2024 1149   HCT 36.7 01/25/2024 1149   PLT 327 01/25/2024 1149   MCV 86.4 01/25/2024 1149   MCH 27.5 01/25/2024 1149   MCHC 31.9 01/25/2024 1149   RDW 13.7 01/25/2024 1149   LYMPHSABS 0.8 11/15/2023 1151   MONOABS 0.3 11/15/2023 1151   EOSABS 0 (L) 12/14/2023 1629   BASOSABS 0 12/14/2023 1629    BMET    Component Value Date/Time   NA 137 01/25/2024 1149   K 3.6 01/25/2024 1149   CL 99 01/25/2024 1149   CO2 25 01/25/2024 1149   GLUCOSE 82 01/25/2024 1149   BUN 14 01/25/2024 1149   CREATININE 0.54 01/25/2024 1149   CREATININE 0.57 07/02/2018 1034   CALCIUM  9.5 01/25/2024 1149   GFRNONAA >60 01/25/2024 1149   GFRAA >60 11/03/2019 1442    BNP    Component Value Date/Time   BNP 21.2 03/22/2019 1527   BNP 16.1 05/27/2016 1623     Imaging:  ECHOCARDIOGRAM COMPLETE Result Date: 03/22/2024    ECHOCARDIOGRAM REPORT   Patient Name:   MAKEYA HILGERT Date of Exam: 03/22/2024 Medical Rec #:  [id number]                Height:       62.0 in Accession #:    [id number]               Weight:       100.8 lb Date of Birth:  Aug 28, 1960                BSA:          1.429 m Patient Age:    63 years                 BP:           92/48 mmHg Patient Gender: F                        HR:           66 bpm. Exam Location:  Outpatient Procedure: 2D Echo, Strain Analysis, Color Doppler and Cardiac Doppler (Both  Spectral and Color Flow Doppler were utilized during procedure). Indications:    Congestive Heart Failure I50.9  History:        Patient has prior  history of Echocardiogram examinations, most                 recent 11/25/2021. CHF and Cardiomyopathy; COPD.  Sonographer:    Koleen Popper RDCS Referring Phys: [phone] EZRA RAMAN [facility]  Sonographer Comments: Image acquisition challenging due to patient body habitus. Global longitudinal strain was attempted. IMPRESSIONS  1. Left ventricular ejection fraction, by estimation, is 55 to 60%. The left ventricle has normal function. The left ventricle has no regional wall motion abnormalities. Left ventricular diastolic parameters were normal.  2. Right ventricular systolic function is normal. The right ventricular size is normal. There is normal pulmonary artery systolic pressure. The estimated right ventricular systolic pressure is 19.2 mmHg.  3. The mitral valve is normal in structure. No evidence of mitral valve regurgitation. No evidence of mitral stenosis.  4. The aortic valve is tricuspid. Aortic valve regurgitation is not visualized. No aortic stenosis is present.  5. The inferior vena cava is normal in size with greater than 50% respiratory variability, suggesting right atrial pressure of 3 mmHg. FINDINGS  Left Ventricle: Left ventricular ejection fraction, by estimation, is 55 to 60%. The left ventricle has normal function. The left ventricle has no regional wall motion abnormalities. The left ventricular internal cavity size was normal in size. There is  no left ventricular hypertrophy. Left ventricular diastolic parameters were normal. Right Ventricle: The right ventricular size is normal. No increase in right ventricular wall thickness. Right ventricular systolic function is normal. There is normal pulmonary artery systolic pressure. The tricuspid regurgitant velocity is 2.01 m/s, and  with an assumed right atrial pressure of 3 mmHg, the estimated right ventricular systolic pressure is 19.2 mmHg. Left Atrium: Left atrial size was normal in size. Right Atrium: Right atrial size was normal in size. Pericardium:  There is no evidence of pericardial effusion. Mitral Valve: The mitral valve is normal in structure. No evidence of mitral valve regurgitation. No evidence of mitral valve stenosis. Tricuspid Valve: The tricuspid valve is normal in structure. Tricuspid valve regurgitation is trivial. Aortic Valve: The aortic valve is tricuspid. Aortic valve regurgitation is not visualized. No aortic stenosis is present. Pulmonic Valve: The pulmonic valve was normal in structure. Pulmonic valve regurgitation is not visualized. Aorta: The aortic root is normal in size and structure. Venous: The inferior vena cava is normal in size with greater than 50% respiratory variability, suggesting right atrial pressure of 3 mmHg. IAS/Shunts: No atrial level shunt detected by color flow Doppler.  LEFT VENTRICLE PLAX 2D LVIDd:         4.31 cm     Diastology LVIDs:         3.46 cm     LV e' medial:   12.30 cm/s LV PW:         0.67 cm     LV E/e' medial: 7.2 LV IVS:        0.70 cm LVOT diam:     1.86 cm LV SV:         53 LV SV Index:   37 LVOT Area:     2.72 cm  LV Volumes (MOD) LV vol d, MOD A2C: 83.4 ml LV vol s, MOD A2C: 31.5 ml LV SV MOD A2C:     51.9 ml RIGHT VENTRICLE  IVC RV Basal diam:  2.75 cm     IVC diam: 1.67 cm RV S prime:     14.30 cm/s TAPSE (M-mode): 2.7 cm LEFT ATRIUM           Index        RIGHT ATRIUM          Index LA diam:      2.15 cm 1.50 cm/m   RA Area:     8.64 cm LA Vol (A4C): 21.0 ml 14.70 ml/m  RA Volume:   15.50 ml 10.85 ml/m  AORTIC VALVE LVOT Vmax:   91.70 cm/s LVOT Vmean:  59.200 cm/s LVOT VTI:    0.195 m  AORTA Ao Root diam: 2.70 cm Ao Asc diam:  3.11 cm MITRAL VALVE               TRICUSPID VALVE MV Area (PHT): 3.65 cm    TR Peak grad:   16.2 mmHg MV Decel Time: 208 msec    TR Vmax:        201.00 cm/s MV E velocity: 88.60 cm/s MV A velocity: 53.50 cm/s  SHUNTS MV E/A ratio:  1.66        Systemic VTI:  0.20 m                            Systemic Diam: 1.86 cm Dalton McleanMD Electronically signed by  Ezra Kanner Signature Date/Time: 03/22/2024/5:27:59 PM    Final     Administration History     None          Latest Ref Rng & Units 08/01/2013    9:53 AM  PFT Results  FVC-Pre L 1.87   FVC-Predicted Pre % 50   FVC-Post L 1.75   FVC-Predicted Post % 47   Pre FEV1/FVC % % 52   Post FEV1/FCV % % 65   FEV1-Pre L 0.98   FEV1-Predicted Pre % 33   FEV1-Post L 1.14   TLC L 4.33   TLC % Predicted % 81   RV % Predicted % 122     No results found for: NITRICOXIDE      Assessment & Plan:   No problem-specific Assessment & Plan notes found for this encounter. Assessment and Plan Assessment & Plan COPD with acute exacerbation due to Pseudomonas infection and possible pneumonia MAC Bronchopulmonary aspergillus  Acute exacerbation of COPD likely due to Pseudomonas infection, with clinical symptoms suggestive of possible pneumonia. Productive cough with green sputum. COVID and flu tests negative; however, suspect possible viral etiology given constellation of symptoms. Lung exam concerning for pneumonia. Ciprofloxacin chosen due to sensitivity and past tolerance. Chest x-ray shows no acute worsening, awaiting final radiology read. Discussed potential side effects of ciprofloxacin. Mucociliary clearance therapies encouraged. VS stable and non-toxic appearing in office; felt appropriate for outpatient management. Strict ED/return precautions reviewed. Verbalized understanding.  - Prescribe ciprofloxacin for 10 days. - Consult with infectious disease if symptoms persist or worsen for potential MAC tx - Continue bronchodilator regimen  - Mucociliary clearance therapies; advised to notify if vest not received by next week   Pleurisy Pleuritic pain likely due to irritation and inflammation of the lung lining, associated with current lung infection. Pain located under the breast and to the side, correlating with lung exam findings - Prescribe prednisone  and provide tapering  instructions. - Supportive care   Chest pain EKG without acute cardiac cause. See above. ED precautions reviewed. Recent echocardiogram  stable. Euvolemic on exam.   Follow-up Monitor treatment response and adjust management as needed. - Review test results and inform her. - Monitor response to ciprofloxacin and prednisone . - Re-evaluate if symptoms persist or recur, considering mycobacterial infection treatment.    Advised if symptoms do not improve or worsen, to please contact office for sooner follow up or seek emergency care.   I spent 45 minutes of dedicated to the care of this patient on the date of this encounter to include pre-visit review of records, face-to-face time with the patient discussing conditions above, post visit ordering of testing, clinical documentation with the electronic health record, making appropriate referrals as documented, and communicating necessary findings to members of the patients care team.  Comer LULLA Rouleau, NP 04/14/2024  Pt aware and understands NP's role.

## 2024-04-18 NOTE — Telephone Encounter (Signed)
 Per Mitch with [employer], she would like to schedule sizing after Nov. 6th when she gets clearance from her DR. She has some concerns right now with her lungs and would like to wait until it would be best for her. I told her I would follow up and she advised me to please call her back closer to that date

## 2024-04-26 ENCOUNTER — Encounter (HOSPITAL_COMMUNITY): Payer: Self-pay | Admitting: Cardiology

## 2024-05-11 ENCOUNTER — Ambulatory Visit (INDEPENDENT_AMBULATORY_CARE_PROVIDER_SITE_OTHER): Admitting: Nurse Practitioner

## 2024-05-11 ENCOUNTER — Encounter: Payer: Self-pay | Admitting: Nurse Practitioner

## 2024-05-11 ENCOUNTER — Ambulatory Visit (INDEPENDENT_AMBULATORY_CARE_PROVIDER_SITE_OTHER)

## 2024-05-11 ENCOUNTER — Ambulatory Visit: Payer: Self-pay | Admitting: Nurse Practitioner

## 2024-05-11 VITALS — BP 111/65 | HR 75 | Ht 64.5 in | Wt 102.0 lb

## 2024-05-11 DIAGNOSIS — A498 Other bacterial infections of unspecified site: Secondary | ICD-10-CM

## 2024-05-11 DIAGNOSIS — J019 Acute sinusitis, unspecified: Secondary | ICD-10-CM

## 2024-05-11 DIAGNOSIS — B4481 Allergic bronchopulmonary aspergillosis: Secondary | ICD-10-CM | POA: Diagnosis not present

## 2024-05-11 DIAGNOSIS — J441 Chronic obstructive pulmonary disease with (acute) exacerbation: Secondary | ICD-10-CM | POA: Diagnosis not present

## 2024-05-11 DIAGNOSIS — K219 Gastro-esophageal reflux disease without esophagitis: Secondary | ICD-10-CM | POA: Diagnosis not present

## 2024-05-11 DIAGNOSIS — J9611 Chronic respiratory failure with hypoxia: Secondary | ICD-10-CM

## 2024-05-11 DIAGNOSIS — J9610 Chronic respiratory failure, unspecified whether with hypoxia or hypercapnia: Secondary | ICD-10-CM

## 2024-05-11 DIAGNOSIS — Z87891 Personal history of nicotine dependence: Secondary | ICD-10-CM

## 2024-05-11 MED ORDER — CIPROFLOXACIN HCL 500 MG PO TABS: 500.0000 mg | ORAL_TABLET | Freq: Two times a day (BID) | ORAL | 0 refills | Status: AC

## 2024-05-11 MED ORDER — FLUTICASONE PROPIONATE 50 MCG/ACT NA SUSP: 2.0000 | Freq: Every day | NASAL | 2 refills | Status: AC

## 2024-05-11 NOTE — Progress Notes (Signed)
 @Patient  ID: [name], female    DOB: 1960/10/30, 63 y.o.   MRN: [id number]  Chief Complaint  Patient presents with   Medical Management of Chronic Issues    Pt states finished Rx and had another issues feels like things are getting worse     Referring provider: Aisha Harvey, MD  HPI: 63 year old female, former smoker followed for cavitary lung disease, bronchopulmonary aspergillus, MAC, COPD mixed type, hx of pseudomonas. She is a patient of Dr. Jiles and last seen in office 04/14/2024. Past medical history significant for cardiomyopathy, HF.   TEST/EVENTS:  01/25/2024 cytology with granulomatous inflammation and RLL. Fungal elements consistent with aspergillus in LUL; cultures positive for pansensitive pseudomonas, MAC, and aspergillus  03/03/2024 CT chest: stable LUL cavitation with intracavity mycetoma. Improving LL nodular opacities and inflammatory lesion, btx.   03/07/2024: OV with Dr. Theophilus. Respiratory symptoms with sputum changes. Increased cough with significant mucus production; change from yellow to darker green with blood. Unusual sweating and chills. Unable to use flutter valve due to jaw issues. Recent identification of MAC disease, pending further consultation regarding need for tx. Previous aspergillus antigen tests normal. Significant weight loss with difficulty regaining. Switch Spiriva  to Stiolto. Start nebulized hypertonic saline. Provide sputum cup for culture. Order vest therapy. Prescribed azithromycin , prednisone  and dexamethasone  for flare ups.   04/14/2024: OV with Annaleese Guier NP COPD with acute exacerbation due to Pseudomonas infection and possible pneumonia MAC Bronchopulmonary aspergillus  Acute exacerbation of COPD likely due to Pseudomonas infection, with clinical symptoms suggestive of possible pneumonia. Productive cough with green sputum. COVID and flu tests negative; however, suspect possible viral etiology given constellation of symptoms.  Lung exam concerning for pneumonia. Ciprofloxacin chosen due to sensitivity and past tolerance. Chest x-ray shows no acute worsening, awaiting final radiology read. Discussed potential side effects of ciprofloxacin. Mucociliary clearance therapies encouraged. VS stable and non-toxic appearing in office; felt appropriate for outpatient management. Strict ED/return precautions reviewed. Verbalized understanding.  - Prescribe ciprofloxacin for 10 days. - Consult with infectious disease if symptoms persist or worsen for potential MAC tx - Continue bronchodilator regimen  - Mucociliary clearance therapies; advised to notify if vest not received by next week  Pleurisy Pleuritic pain likely due to irritation and inflammation of the lung lining, associated with current lung infection. Pain located under the breast and to the side, correlating with lung exam findings - Prescribe prednisone  and provide tapering instructions. - Supportive care  Chest pain EKG without acute cardiac cause. See above. ED precautions reviewed. Recent echocardiogram stable. Euvolemic on exam.    05/11/2024: Today - follow up Discussed the use of AI scribe software for clinical note transcription with the patient, who gave verbal consent to proceed.  History of Present Illness  [name] is a 63 year old female with recurrent pneumonia who presents for follow up.  She is still feeling better since our last visit. Within 36 hours after starting medication, she was improved with resolution of the chest pain and pleurisy she was experiencing.   Two days before finishing her medication, she added almond butter to her diet, which she suspects may have triggered her recurrent symptoms that she is now experiencing. About 30 minutes after consuming a smoothie with almond butter, she began coughing, and was worried she aspirated. Since then, she's had increased mucus production. Initially, the mucus was yellow, then  green, and now dark green, with occasional streaky blood, though no hemoptysis in the past few  days. She's worried she has pneumonia again.   She has experienced chills and unusual daytime sweats, which started two days ago, similar to symptoms she had during her last three pneumonias. She feels something is 'brewing' in her lungs, possibly due to aspiration of her smoothie. She can feel the issue in her lower lobe, which is a familiar sensation from previous aspiration pneumonias.  Her oxygen levels drop into the 80s when she stands to do chores despite wearing her oxygen. She does recover quickly with rest. She's wearing 2-2.5 lpm. Her sinuses are now producing green and unpleasant mucus, which is unusual for her. No fevers. She is still eating and drinking well. No wheezing or chest tightness. Her breathing feels just slightly more winded with exertion. No calf pain/swelling/redness.   She is not currently using a flutter valve or doing breathing treatments. She's not taking guaifenesin right now. She is using her Spiriva  inhaler daily.     Allergies  Allergen Reactions   Transderm-Scop [Scopolamine] Anaphylaxis, Shortness Of Breath and Other (See Comments)    Respiratory distress   Zestril  [Lisinopril ] Shortness Of Breath and Cough   Cymbalta [Duloxetine Hcl] Other (See Comments)    Unknown reaction   Ivp Dye [Iodinated Contrast Media] Other (See Comments)    Unknown reaction   Levaquin  [Levofloxacin ] Other (See Comments)    Numbness hands and feet   Other Other (See Comments)    Do not put pharmaceutical patches on skin   Coreg  [Carvedilol ] Rash    Immunization History  Administered Date(s) Administered   Moderna Covid-19 Vaccine Bivalent Booster 72yrs & up 03/21/2021   Moderna SARS-COV2 Booster Vaccination 07/24/2020, 11/22/2020   Moderna Sars-Covid-2 Vaccination 09/15/2019, 10/13/2019, 02/17/2020, 07/24/2020, 11/22/2020, 04/04/2022   PNEUMOCOCCAL CONJUGATE-20 12/12/2020   PPD  Test 11/10/2023   Tdap 10/21/2022    Past Medical History:  Diagnosis Date   Acute liver failure    Anemia    Anorexia nervosa (HCC)    h/o    CAP (community acquired pneumonia)    Cardiomyopathy (HCC)    CHF (congestive heart failure) (HCC)    Difficult intubation    secondary to jaw muscles   Fibromyalgia    Fibromyalgia syndrome    GERD (gastroesophageal reflux disease)    IBS (irritable bowel syndrome)    MS (multiple sclerosis)    PTSD (post-traumatic stress disorder)    Sacral decubitus ulcer    Shock circulatory (HCC)    TMJ disease     Tobacco History: Social History   Tobacco Use  Smoking Status Former   Current packs/day: 0.00   Average packs/day: 1 pack/day for 11.0 years (11.0 ttl pk-yrs)   Types: Cigarettes   Start date: 18   Quit date: 07/07/1987   Years since quitting: 36.8  Smokeless Tobacco Never   Counseling given: Not Answered   Outpatient Medications Prior to Visit  Medication Sig Dispense Refill   acetaminophen  (TYLENOL ) 325 MG tablet Take 975 mg by mouth daily.     azithromycin  (ZITHROMAX ) 250 MG tablet Take 500 mg on day 1 and then 250 mg/day for the next 4 days 6 tablet 3   bisoprolol  (ZEBETA ) 5 MG tablet TAKE 1/2 TABLET BY MOUTH DAILY 45 tablet 3   cholecalciferol  (VITAMIN D3) 25 MCG (1000 UNIT) tablet Take 2,000 Units by mouth daily.     dexlansoprazole  (DEXILANT ) 60 MG capsule Take 1 capsule (60 mg total) by mouth daily. 90 capsule 3   famotidine  (PEPCID ) 20 MG tablet Take 1  tablet (20 mg total) by mouth at bedtime.     ipratropium-albuterol  (DUONEB) 0.5-2.5 (3) MG/3ML SOLN Take 3 mLs by nebulization every 6 (six) hours as needed. 360 mL 3   irbesartan  (AVAPRO ) 75 MG tablet TAKE ONE TABLET BY MOUTH ONCE DAILY 30 tablet 11   levalbuterol  (XOPENEX  HFA) 45 MCG/ACT inhaler Inhale up to 2 puffs every 4 hours as needed for shortness of breath or wheezing. 15 g 3   Magnesium  250 MG TABS Take 250 mg by mouth daily.     Multiple  Vitamins-Minerals (MULTIVITAMIN WOMEN 50+) TABS Take 1 tablet by mouth daily.     OXYGEN Inhale 2-3 L/min into the lungs continuous.     predniSONE  (DELTASONE ) 10 MG tablet Take 40 mg by mouth as needed.     rosuvastatin  (CRESTOR ) 10 MG tablet Take 1 tablet (10 mg total) by mouth daily. 90 tablet 3   SPIRIVA  RESPIMAT 2.5 MCG/ACT AERS SMARTSIG:2 Spray(s) By Mouth Daily     tiotropium (SPIRIVA ) 18 MCG inhalation capsule Place 18 mcg into inhaler and inhale daily.     Facility-Administered Medications Prior to Visit  Medication Dose Route Frequency Provider Last Rate Last Admin   ipratropium-albuterol  (DUONEB) 0.5-2.5 (3) MG/3ML nebulizer solution 3 mL  3 mL Nebulization Q6H PRN Mannam, Praveen, MD         Review of Systems: as above    Physical Exam:  BP 111/65   Pulse 75   Ht 5' 4.5 (1.638 m) Comment: per PT  Wt 102 lb (46.3 kg)   SpO2 100%   BMI 17.24 kg/m   GEN: Pleasant, interactive, well-kempt; acutely-ill appearing; in no acute distress HEENT:  Normocephalic and atraumatic. EACs patent bilaterally. TM pearly gray with present light reflex bilaterally. PERRLA. Sclera white. Nasal turbinates erythematous, moist and patent bilaterally. Yellow rhinorrhea present. Oropharynx pink and moist, without exudate or edema. No lesions, ulcerations, or postnasal drip.  NECK:  Supple w/ fair ROM. No JVD present. Normal carotid impulses w/o bruits. Thyroid  symmetrical with no goiter or nodules palpated. Cervical lymphadenopathy.   CV: RRR, no m/r/g, no peripheral edema. Pulses intact, +2 bilaterally. No cyanosis, pallor or clubbing. PULMONARY:  Unlabored, regular breathing. Diminished bibasilar airflow otherwise clear bilaterally A&P w/o wheezes/rales/rhonchi. No accessory muscle use.  GI: BS present and normoactive. Soft, non-tender to palpation.  MSK: No tenderness, warmth. Cap refil <2 sec all extrem.  Neuro: A/Ox3. No focal deficits noted.   Skin: Warm, no lesions or rashe Psych: Normal  affect and behavior. Judgement and thought content appropriate.     Lab Results:  CBC    Component Value Date/Time   WBC 5.7 01/25/2024 1149   RBC 4.25 01/25/2024 1149   HGB 11.7 (L) 01/25/2024 1149   HCT 36.7 01/25/2024 1149   PLT 327 01/25/2024 1149   MCV 86.4 01/25/2024 1149   MCH 27.5 01/25/2024 1149   MCHC 31.9 01/25/2024 1149   RDW 13.7 01/25/2024 1149   LYMPHSABS 0.8 11/15/2023 1151   MONOABS 0.3 11/15/2023 1151   EOSABS 0 (L) 12/14/2023 1629   BASOSABS 0 12/14/2023 1629    BMET    Component Value Date/Time   NA 137 01/25/2024 1149   K 3.6 01/25/2024 1149   CL 99 01/25/2024 1149   CO2 25 01/25/2024 1149   GLUCOSE 82 01/25/2024 1149   BUN 14 01/25/2024 1149   CREATININE 0.54 01/25/2024 1149   CREATININE 0.57 07/02/2018 1034   CALCIUM  9.5 01/25/2024 1149   GFRNONAA >60 01/25/2024 1149  GFRAA >60 11/03/2019 1442    BNP    Component Value Date/Time   BNP 21.2 03/22/2019 1527   BNP 16.1 05/27/2016 1623     Imaging:  DG Chest 2 View Result Date: 04/14/2024 EXAM: 2 VIEW(S) XRAY OF THE CHEST 04/14/2024 12:04:46 PM COMPARISON: 03/03/2024 and 01/25/2024. CLINICAL HISTORY: pleurisy; productive cough. FINDINGS: LUNGS AND PLEURA: Slightly improved right basilar opacity is noted compared to prior exam. Stable left basilar opacity is noted. Grossly stable cavitary lesion seen in left lung apex. No pulmonary edema. No pleural effusion. No pneumothorax. HEART AND MEDIASTINUM: No acute abnormality of the cardiac and mediastinal silhouettes. BONES AND SOFT TISSUES: No acute osseous abnormality. IMPRESSION: 1. Slightly improved right basilar opacity compared to prior exam 2. Stable left basilar opacity 3. Stable cavitary lesion in the left lung apex Electronically signed by: Lynwood Seip MD 04/14/2024 12:33 PM EDT RP Workstation: [id number]    Administration History     None          Latest Ref Rng & Units 08/01/2013    9:53 AM  PFT Results  FVC-Pre L 1.87    FVC-Predicted Pre % 50   FVC-Post L 1.75   FVC-Predicted Post % 47   Pre FEV1/FVC % % 52   Post FEV1/FCV % % 65   FEV1-Pre L 0.98   FEV1-Predicted Pre % 33   FEV1-Post L 1.14   TLC L 4.33   TLC % Predicted % 81   RV % Predicted % 122     No results found for: NITRICOXIDE      Assessment & Plan:   No problem-specific Assessment & Plan notes found for this encounter. Assessment and Plan Assessment & Plan COPD with acute exacerbation due to Pseudomonas infection and possible pneumonia MAC Bronchopulmonary aspergillus  Acute sinusitis  Concern for recurrent aspiration pneumonia vs acute sinusitis with associated cough. Her lung exam today was actually reassuring. Will collect sputum culture and repeat CXR to rule out superimposed infection. Treat sinus symptoms and empirically for aspiration pneumonia with 14 days of ciprofloxacin, given prior improvement and tolerance. Side effect profile reviewed. Aggressive mucociliary and sinus regimen advised. Continue LAMA for maintenance. Avoid ICS due to risk of increased risk of infectious. Action plan in place.  - Ordered repeat chest x-ray - Collect sputum cultures - Prescribed ciprofloxacin for 14 days - Held prednisone  unless symptoms worsen - Instructed to call if experiencing increased tightness, breathing difficulties, or wheezing - Advised to use saline nebulizer twice a day - Provided flutter valve for use after saline nebulizer - Instructed to use guaifenesin twice a day - Advised to monitor for tendon pain or watery bowel movements - Recommended taking a probiotic while on antibiotics - ED precautions reviewed   Acute sinusitis Possible exacerbation due to GERD. Symptoms include green nasal discharge. Sinusitis may be contributing to respiratory symptoms. - Advised saline nasal rinse with bottled distilled water  - Instructed to use Flonase nasal spray after 30 minutes of saline rinse  Gastroesophageal reflux disease  (GERD) GERD may be contributing to sinusitis and aspiration events. Recent dietary changes may have exacerbated symptoms. - GERD precautions reviewed  Chronic respiratory failure on supplemental oxygen Hypoxemia on exertion per her report. No hypoxia in office today on supplemental O2. Advised to continue to monitor for goal>88-90%. Strict ED precautions reviewed.  - Monitor oxygen saturation during exertion - Continue supplemental oxygen    Advised if symptoms do not improve or worsen, to please contact office for sooner follow  up or seek emergency care.   I spent 45 minutes of dedicated to the care of this patient on the date of this encounter to include pre-visit review of records, face-to-face time with the patient discussing conditions above, post visit ordering of testing, clinical documentation with the electronic health record, making appropriate referrals as documented, and communicating necessary findings to members of the patients care team.  Comer LULLA Rouleau, NP 05/11/2024  Pt aware and understands NP's role.

## 2024-05-11 NOTE — Patient Instructions (Signed)
 Continue levalbuterol  inhaler 2 puffs or duoneb 3 mL neb every 6 hours as needed for shortness of breath or wheezing. Notify if symptoms persist despite rescue inhaler/neb use.  Use duoneb treatments 3-4 times a day then use hypertonic saline nebs twice a day until symptoms improve followed by flutter valve Continue spiriva  2 puffs daily Continue supplemental oxygen 2.5 lpm for goal >88-90%. If you have oxygen levels 88% or below and cannot keep them up with your oxygen, go to the hospital    Guaifenesin (Mucinex) 1200 mg Twice daily for cough/congestion Ciprofloxacin 500 mg Twice daily for 14 days. Take with food. Notify immediately of any tendon pain or watery bowel movements  Saline nasal rinses 1-2 times a day, use bottled distilled water . Follow with flonase 2 sprays each nostril daily    Chest x ray today   Sputum culture- collect and return  Follow up in 2 weeks with Dr. Theophilus or Katie Ezra Marquess,NP. If symptoms do not improve or worsen, please contact office for sooner follow up or seek emergency care

## 2024-05-22 ENCOUNTER — Other Ambulatory Visit (HOSPITAL_COMMUNITY)

## 2024-05-25 ENCOUNTER — Encounter: Payer: Self-pay | Admitting: Nurse Practitioner

## 2024-05-25 ENCOUNTER — Other Ambulatory Visit: Payer: Self-pay | Admitting: *Deleted

## 2024-05-25 ENCOUNTER — Ambulatory Visit (INDEPENDENT_AMBULATORY_CARE_PROVIDER_SITE_OTHER): Admitting: Nurse Practitioner

## 2024-05-25 VITALS — BP 94/60 | HR 71 | Temp 98.1°F | Ht 64.5 in | Wt 103.0 lb

## 2024-05-25 DIAGNOSIS — B4481 Allergic bronchopulmonary aspergillosis: Secondary | ICD-10-CM | POA: Diagnosis not present

## 2024-05-25 DIAGNOSIS — K219 Gastro-esophageal reflux disease without esophagitis: Secondary | ICD-10-CM

## 2024-05-25 DIAGNOSIS — A498 Other bacterial infections of unspecified site: Secondary | ICD-10-CM

## 2024-05-25 DIAGNOSIS — J984 Other disorders of lung: Secondary | ICD-10-CM

## 2024-05-25 DIAGNOSIS — J479 Bronchiectasis, uncomplicated: Secondary | ICD-10-CM

## 2024-05-25 DIAGNOSIS — A310 Pulmonary mycobacterial infection: Secondary | ICD-10-CM

## 2024-05-25 DIAGNOSIS — J439 Emphysema, unspecified: Secondary | ICD-10-CM

## 2024-05-25 DIAGNOSIS — J4489 Other specified chronic obstructive pulmonary disease: Secondary | ICD-10-CM

## 2024-05-25 DIAGNOSIS — J9611 Chronic respiratory failure with hypoxia: Secondary | ICD-10-CM

## 2024-05-25 LAB — HEPATIC FUNCTION PANEL
ALT: 22 U/L (ref 0–35)
AST: 21 U/L (ref 0–37)
Albumin: 4 g/dL (ref 3.5–5.2)
Alkaline Phosphatase: 63 U/L (ref 39–117)
Bilirubin, Direct: 0 mg/dL (ref 0.0–0.3)
Total Bilirubin: 0.2 mg/dL (ref 0.2–1.2)
Total Protein: 6.7 g/dL (ref 6.0–8.3)

## 2024-05-25 NOTE — Patient Instructions (Addendum)
 Continue levalbuterol  inhaler 2 puffs or duoneb 3 mL neb every 6 hours as needed for shortness of breath or wheezing. Notify if symptoms persist despite rescue inhaler/neb use.  Use duoneb treatments 3-4 times a day then use hypertonic saline nebs twice a day followed by flutter valve Continue spiriva  2 puffs daily Continue supplemental oxygen 2.5 lpm for goal >88-90%.  Continue Guaifenesin (Mucinex) 1200 mg Twice daily for cough/congestion Continue dexilant  and pepcid  as prescribed  Try Reflux G[employer]met for reflux - you can buy this online. Recommend that you use this at bedtime and anytime you have breakthrough reflux symptoms   Start Brinsupri 10 mg once daily for your bronchiectasis. If you do well with this for 3 months, we may increase you to the 25 mg once daily as this showed the best results. This helps reduce inflammation in the lungs and reduce flares/infection rates  Notify of any skin or dental changes. Keep an eye on your blood pressure.   Glad you are feeling better  Labs today   CT chest ordered - someone will contact you for scheduling    Follow up as scheduled with Dr. Theophilus. Follow up with Izetta Sajan [place],NP first week in January to review CT results. If symptoms do not improve or worsen, please contact office for sooner follow up or seek emergency care

## 2024-05-25 NOTE — Progress Notes (Signed)
 @Patient  ID: [name], female    DOB: Apr 01, 1961, 63 y.o.   MRN: [id number]  Chief Complaint  Patient presents with   Follow-up    Sob-Feeling better, cough better worse at night. Light Green with some sm. Amt. Of blood,occass. Wheezing, denies fever.    Referring provider: Aisha Harvey, MD  HPI: 63 year old female, former smoker followed for cavitary lung disease, bronchopulmonary aspergillus, MAC, COPD mixed type, hx of pseudomonas. She is a patient of Dr. Jiles and last seen in office 05/11/2024 by [facility] NP. Past medical history significant for cardiomyopathy, HF.   TEST/EVENTS:  01/25/2024 cytology with granulomatous inflammation and RLL. Fungal elements consistent with aspergillus in LUL; cultures positive for pansensitive pseudomonas, MAC, and aspergillus  03/03/2024 CT chest: stable LUL cavitation with intracavity mycetoma. Improving LL nodular opacities and inflammatory lesion, btx.   03/07/2024: OV with Dr. Theophilus. Respiratory symptoms with sputum changes. Increased cough with significant mucus production; change from yellow to darker green with blood. Unusual sweating and chills. Unable to use flutter valve due to jaw issues. Recent identification of MAC disease, pending further consultation regarding need for tx. Previous aspergillus antigen tests normal. Significant weight loss with difficulty regaining. Switch Spiriva  to Stiolto. Start nebulized hypertonic saline. Provide sputum cup for culture. Order vest therapy. Prescribed azithromycin , prednisone  and dexamethasone  for flare ups.   04/14/2024: OV with Elsey Holts NP COPD with acute exacerbation due to Pseudomonas infection and possible pneumonia MAC Bronchopulmonary aspergillus  Acute exacerbation of COPD likely due to Pseudomonas infection, with clinical symptoms suggestive of possible pneumonia. Productive cough with green sputum. COVID and flu tests negative; however, suspect possible viral etiology given  constellation of symptoms. Lung exam concerning for pneumonia. Ciprofloxacin chosen due to sensitivity and past tolerance. Chest x-ray shows no acute worsening, awaiting final radiology read. Discussed potential side effects of ciprofloxacin. Mucociliary clearance therapies encouraged. VS stable and non-toxic appearing in office; felt appropriate for outpatient management. Strict ED/return precautions reviewed. Verbalized understanding.  - Prescribe ciprofloxacin for 10 days. - Consult with infectious disease if symptoms persist or worsen for potential MAC tx - Continue bronchodilator regimen  - Mucociliary clearance therapies; advised to notify if vest not received by next week  Pleurisy Pleuritic pain likely due to irritation and inflammation of the lung lining, associated with current lung infection. Pain located under the breast and to the side, correlating with lung exam findings - Prescribe prednisone  and provide tapering instructions. - Supportive care  Chest pain EKG without acute cardiac cause. See above. ED precautions reviewed. Recent echocardiogram stable. Euvolemic on exam.    05/11/2024: OV with Consuello Lassalle NP [name] is a 63 year old female with recurrent pneumonia who presents for follow up. She is still feeling better since our last visit. Within 36 hours after starting medication, she was improved with resolution of the chest pain and pleurisy she was experiencing.  Two days before finishing her medication, she added almond butter to her diet, which she suspects may have triggered her recurrent symptoms that she is now experiencing. About 30 minutes after consuming a smoothie with almond butter, she began coughing, and was worried she aspirated. Since then, she's had increased mucus production. Initially, the mucus was yellow, then green, and now dark green, with occasional streaky blood, though no hemoptysis in the past few days. She's worried she has pneumonia  again.  She has experienced chills and unusual daytime sweats, which started two days ago, similar to symptoms she had  during her last three pneumonias. She feels something is 'brewing' in her lungs, possibly due to aspiration of her smoothie. She can feel the issue in her lower lobe, which is a familiar sensation from previous aspiration pneumonias. Her oxygen levels drop into the 80s when she stands to do chores despite wearing her oxygen. She does recover quickly with rest. She's wearing 2-2.5 lpm. Her sinuses are now producing green and unpleasant mucus, which is unusual for her. No fevers. She is still eating and drinking well. No wheezing or chest tightness. Her breathing feels just slightly more winded with exertion. No calf pain/swelling/redness.  She is not currently using a flutter valve or doing breathing treatments. She's not taking guaifenesin right now. She is using her Spiriva  inhaler daily.   05/25/2024: Today - follow up Discussed the use of AI scribe software for clinical note transcription with the patient, who gave verbal consent to proceed. History of Present Illness  [name] is a 63 year old female with bronchiectasis who presents for follow up. She was treated for slow to resolve flare at our last OV with ciprofloxacin.   Her cough is improved; occasional production of light green phlegm with some beige to brown that may be old blood. No bright blood. The cough is less severe during the day. Still gets worse at night. She sleeps at a ninety-degree angle due to past aspiration experiences. No fevers, hemoptysis, chills, weight loss, anorexia.   Her gastroesophageal reflux disease (GERD) sometimes exacerbates her respiratory symptoms. She is taking Dexilant  and famotidine  for GERD management. The reflux is positional, worsening when she lowers her head. The famotidine  at night does help but still occasionally has some breakthrough symptoms. No dysphagia, abd  pain, changes in bowel habits.   She has a history of bronchiectasis, leading to recurrent infections requiring abx. She manages her condition with nebulizer treatments and a flutter valve to clear mucus.  She is due for a CT in December for 3 month follow up. Dr. Overton, with ID, has wanted her to get a scan every 3-4 months for monitoring.   She was never able to return sputum culture after our last visit.  No increased oxygen requirements. Sinus symptoms resolved.     Allergies  Allergen Reactions   Transderm-Scop [Scopolamine] Anaphylaxis, Shortness Of Breath and Other (See Comments)    Respiratory distress   Zestril  [Lisinopril ] Shortness Of Breath and Cough   Cymbalta [Duloxetine Hcl] Other (See Comments)    Unknown reaction   Ivp Dye [Iodinated Contrast Media] Other (See Comments)    Unknown reaction   Levaquin  [Levofloxacin ] Other (See Comments)    Numbness hands and feet   Other Other (See Comments)    Do not put pharmaceutical patches on skin   Coreg  [Carvedilol ] Rash    Immunization History  Administered Date(s) Administered   Moderna Covid-19 Vaccine Bivalent Booster 24yrs & up 03/21/2021   Moderna SARS-COV2 Booster Vaccination 07/24/2020, 11/22/2020   Moderna Sars-Covid-2 Vaccination 09/15/2019, 10/13/2019, 02/17/2020, 07/24/2020, 11/22/2020, 04/04/2022   PNEUMOCOCCAL CONJUGATE-20 12/12/2020   PPD Test 11/10/2023   Tdap 10/21/2022    Past Medical History:  Diagnosis Date   Acute liver failure    Anemia    Anorexia nervosa (HCC)    h/o    CAP (community acquired pneumonia)    Cardiomyopathy (HCC)    CHF (congestive heart failure) (HCC)    Difficult intubation    secondary to jaw muscles   Fibromyalgia    Fibromyalgia  syndrome    GERD (gastroesophageal reflux disease)    IBS (irritable bowel syndrome)    MS (multiple sclerosis)    PTSD (post-traumatic stress disorder)    Sacral decubitus ulcer    Shock circulatory (HCC)    TMJ disease     Tobacco  History: Social History   Tobacco Use  Smoking Status Former   Current packs/day: 0.00   Average packs/day: 1 pack/day for 11.0 years (11.0 ttl pk-yrs)   Types: Cigarettes   Start date: 15   Quit date: 07/07/1987   Years since quitting: 36.9  Smokeless Tobacco Never   Counseling given: Not Answered   Outpatient Medications Prior to Visit  Medication Sig Dispense Refill   acetaminophen  (TYLENOL ) 325 MG tablet Take 975 mg by mouth daily.     bisoprolol  (ZEBETA ) 5 MG tablet TAKE 1/2 TABLET BY MOUTH DAILY 45 tablet 3   cholecalciferol  (VITAMIN D3) 25 MCG (1000 UNIT) tablet Take 2,000 Units by mouth daily.     dexlansoprazole  (DEXILANT ) 60 MG capsule Take 1 capsule (60 mg total) by mouth daily. 90 capsule 3   famotidine  (PEPCID ) 20 MG tablet Take 1 tablet (20 mg total) by mouth at bedtime.     fluticasone (FLONASE) 50 MCG/ACT nasal spray Place 2 sprays into both nostrils daily. 18.2 mL 2   ipratropium-albuterol  (DUONEB) 0.5-2.5 (3) MG/3ML SOLN Take 3 mLs by nebulization every 6 (six) hours as needed. 360 mL 3   irbesartan  (AVAPRO ) 75 MG tablet TAKE ONE TABLET BY MOUTH ONCE DAILY 30 tablet 11   levalbuterol  (XOPENEX  HFA) 45 MCG/ACT inhaler Inhale up to 2 puffs every 4 hours as needed for shortness of breath or wheezing. 15 g 3   Magnesium  250 MG TABS Take 250 mg by mouth daily.     Multiple Vitamins-Minerals (MULTIVITAMIN WOMEN 50+) TABS Take 1 tablet by mouth daily.     OXYGEN Inhale 2-3 L/min into the lungs continuous.     predniSONE  (DELTASONE ) 10 MG tablet Take 40 mg by mouth as needed.     SPIRIVA  RESPIMAT 2.5 MCG/ACT AERS SMARTSIG:2 Spray(s) By Mouth Daily     azithromycin  (ZITHROMAX ) 250 MG tablet Take 500 mg on day 1 and then 250 mg/day for the next 4 days (Patient not taking: Reported on 05/25/2024) 6 tablet 3   ciprofloxacin (CIPRO) 500 MG tablet Take 1 tablet (500 mg total) by mouth 2 (two) times daily for 14 days. 28 tablet 0   rosuvastatin  (CRESTOR ) 10 MG tablet Take 1 tablet  (10 mg total) by mouth daily. 90 tablet 3   tiotropium (SPIRIVA ) 18 MCG inhalation capsule Place 18 mcg into inhaler and inhale daily.     Facility-Administered Medications Prior to Visit  Medication Dose Route Frequency Provider Last Rate Last Admin   ipratropium-albuterol  (DUONEB) 0.5-2.5 (3) MG/3ML nebulizer solution 3 mL  3 mL Nebulization Q6H PRN Mannam, Praveen, MD         Review of Systems: as above    Physical Exam:  BP 94/60 (BP Location: Left Arm, Cuff Size: Normal)   Pulse 71   Temp 98.1 F (36.7 C) (Oral)   Ht 5' 4.5 (1.638 m)   Wt 103 lb (46.7 kg)   SpO2 100%   BMI 17.41 kg/m   GEN: Pleasant, interactive, well-kempt; chronically-ill appearing; in no acute distress HEENT:  Normocephalic and atraumatic. PERRLA. Sclera white. Nasal turbinates pink, moist and patent bilaterally. No rhinorrhea present. Oropharynx pink and moist, without exudate or edema. No lesions, ulcerations, or  postnasal drip.  NECK:  Supple w/ fair ROM. No JVD present. No lymphadenopathy.   CV: RRR, no m/r/g, no peripheral edema. Pulses intact, +2 bilaterally. No cyanosis, pallor or clubbing. PULMONARY:  Unlabored, regular breathing. Diminished bibasilar airflow otherwise clear bilaterally A&P w/o wheezes/rales/rhonchi. No accessory muscle use.  GI: BS present and normoactive. Soft, non-tender to palpation.  MSK: No tenderness, warmth. Cap refil <2 sec all extrem.  Neuro: A/Ox3. No focal deficits noted.   Skin: Warm, no lesions or rashe Psych: Normal affect and behavior. Judgement and thought content appropriate.     Lab Results:  CBC    Component Value Date/Time   WBC 5.7 01/25/2024 1149   RBC 4.25 01/25/2024 1149   HGB 11.7 (L) 01/25/2024 1149   HCT 36.7 01/25/2024 1149   PLT 327 01/25/2024 1149   MCV 86.4 01/25/2024 1149   MCH 27.5 01/25/2024 1149   MCHC 31.9 01/25/2024 1149   RDW 13.7 01/25/2024 1149   LYMPHSABS 0.8 11/15/2023 1151   MONOABS 0.3 11/15/2023 1151   EOSABS 0 (L)  12/14/2023 1629   BASOSABS 0 12/14/2023 1629    BMET    Component Value Date/Time   NA 137 01/25/2024 1149   K 3.6 01/25/2024 1149   CL 99 01/25/2024 1149   CO2 25 01/25/2024 1149   GLUCOSE 82 01/25/2024 1149   BUN 14 01/25/2024 1149   CREATININE 0.54 01/25/2024 1149   CREATININE 0.57 07/02/2018 1034   CALCIUM  9.5 01/25/2024 1149   GFRNONAA >60 01/25/2024 1149   GFRAA >60 11/03/2019 1442    BNP    Component Value Date/Time   BNP 21.2 03/22/2019 1527   BNP 16.1 05/27/2016 1623     Imaging:  DG Chest 2 View Result Date: 05/11/2024 EXAM: 2 VIEW(S) XRAY OF THE CHEST 05/11/2024 11:38:07 AM COMPARISON: 04/14/2024 CLINICAL HISTORY: productive cough; potential aspiration FINDINGS: LUNGS AND PLEURA: Bilateral lower lobe nodular interstitial opacities corresponding to areas of chronic mucoid impaction are again noted and appear unchanged from previous exam. Left apical cavitary process is also again seen and appears stable from the previous study. No superimposed airspace consolidation, atelectasis, or pneumothorax. No pulmonary edema. No pleural effusion. HEART AND MEDIASTINUM: No acute abnormality of the cardiac and mediastinal silhouettes. BONES AND SOFT TISSUES: No acute osseous abnormality. IMPRESSION: 1. No acute cardiopulmonary process. 2. Chronic changes as described above are most indicative of an indolent atypical inflammatory or infectious process. Electronically signed by: Waddell Calk MD 05/11/2024 12:42 PM EST RP Workstation: [id number]    Administration History     None          Latest Ref Rng & Units 08/01/2013    9:53 AM  PFT Results  FVC-Pre L 1.87   FVC-Predicted Pre % 50   FVC-Post L 1.75   FVC-Predicted Post % 47   Pre FEV1/FVC % % 52   Post FEV1/FCV % % 65   FEV1-Pre L 0.98   FEV1-Predicted Pre % 33   FEV1-Post L 1.14   TLC L 4.33   TLC % Predicted % 81   RV % Predicted % 122     No results found for: NITRICOXIDE      Assessment & Plan:    Assessment & Plan Chronic respiratory failure on supplemental oxygen No increased oxygen requirements. Advised to continue to monitor for goal>88-90%.  - Monitor oxygen saturation  - Continue supplemental oxygen   COPD  MAC Bronchopulmonary aspergillus  Bronchiectasis  Recent exacerbation treated with ciprofloxacin  with clinical  improvement. Chronic productive cough at baseline. No recurrent hemoptysis. Coughing is worse at night, likely exacerbated by GERD. Previous chest x-ray was reassuring. She has had multiple exacerbations over the last 6 months-1 year requiring abx. Discussed starting Brinsupri, the first and only approved treatment for bronchiectasis, to reduce neutrophilic inflammation and hopefully decrease infection frequency. Reviewed side effects including potential hyperkeratosis and gingivitis. Liver enzymes to be checked before starting Brinsupri. Continue aggressive mucociliary regimen. Continue LAMA for maintenance. Avoid ICS due to risk of increased risk of infectious. Action plan in place.  - Start Brinsupri after checking liver enzymes. - Ordered paperwork for Brinsupri and coordinated with pharmacy team for delivery. - Continue nebulizer treatments and use of flutter valve. - Collect sputum culture if increased phlegm occurs - Continue Spiriva  daily  - Ordered CT scan for end of December. - Follow up with ID as scheduled   Gastroesophageal reflux disease (GERD) GERD contributing to nighttime coughing and aspiration. Discussed using Reflux Gourmet, an over-the-counter supplement derived from seaweed algae, to help manage breakthrough symptoms. It is to be used in conjunction with current medications, Dexilant  and famotidine . Reflux/aspiration precautions reviewed.  - Start Reflux Gourmet, use after meals and especially at bedtime. - Continue Dexilant  and famotidine .   Advised if symptoms do not improve or worsen, to please contact office for sooner follow up or seek  emergency care.   I spent 35 minutes of dedicated to the care of this patient on the date of this encounter to include pre-visit review of records, face-to-face time with the patient discussing conditions above, post visit ordering of testing, clinical documentation with the electronic health record, making appropriate referrals as documented, and communicating necessary findings to members of the patients care team.  Comer LULLA Rouleau, NP 05/25/2024  Pt aware and understands NP's role.

## 2024-05-26 ENCOUNTER — Ambulatory Visit: Payer: Self-pay | Admitting: Nurse Practitioner

## 2024-05-26 ENCOUNTER — Telehealth: Payer: Self-pay

## 2024-05-26 NOTE — Telephone Encounter (Signed)
 Received referral for new start Brinsupri. Opening benefits investigation in this thread.

## 2024-05-29 NOTE — Telephone Encounter (Signed)
 Submitted a Prior Authorization request to [employer] for BRINSUPRI via CoverMyMeds. Will update once we receive a response.  Key: [id number]

## 2024-05-31 NOTE — Telephone Encounter (Signed)
 Received a fax regarding Prior Authorization from [employer] for BRINSUPRI. Authorization has been DENIED because the requested medication and/or diagnosis are not a covered benefit and excluded from coverage in accordance with the terms and conditions of your plan benefit. Alternative medication is amoxicillin /clavulanate.  Phone# [phone]

## 2024-06-07 ENCOUNTER — Ambulatory Visit: Admitting: Pulmonary Disease

## 2024-06-07 ENCOUNTER — Encounter: Payer: Self-pay | Admitting: Pulmonary Disease

## 2024-06-07 VITALS — BP 106/62 | HR 72 | Temp 98.0°F | Ht 64.0 in | Wt 103.0 lb

## 2024-06-07 DIAGNOSIS — B4481 Allergic bronchopulmonary aspergillosis: Secondary | ICD-10-CM

## 2024-06-07 DIAGNOSIS — J479 Bronchiectasis, uncomplicated: Secondary | ICD-10-CM

## 2024-06-07 DIAGNOSIS — A498 Other bacterial infections of unspecified site: Secondary | ICD-10-CM | POA: Diagnosis not present

## 2024-06-07 DIAGNOSIS — A310 Pulmonary mycobacterial infection: Secondary | ICD-10-CM | POA: Diagnosis not present

## 2024-06-07 NOTE — Patient Instructions (Signed)
  VISIT SUMMARY: You visited us  today due to a recent worsening of your respiratory symptoms. We discussed your ongoing lung conditions, recent treatments, and your current symptoms. We also reviewed your fatigue, nutritional status, and gastrointestinal issues.  YOUR PLAN: BRONCHIECTASIS WITH CHRONIC PSEUDOMONAS INFECTION: You have a chronic lung condition that recently worsened, but improved with ciprofloxacin  and prednisone . However, symptoms worsened after stopping prednisone . -Continue using ciprofloxacin  for flare-ups as needed, but avoid frequent use to prevent resistance. -We will check with the pharmacy team to see if an appeal for Brinsupri is possible.  PULMONARY MYCOBACTERIUM AVIUM COMPLEX (MAC) INFECTION: You have a lung infection that we are monitoring because aggressive treatment would affect your quality of life. -We will continue monitoring with CT scans every four months.  CHRONIC PULMONARY ASPERGILLOSIS WITH MYCETOMA: You have a fungal infection in your lungs that is currently not active. -We will continue monitoring for any changes in symptoms or activity.  CHRONIC RESPIRATORY FAILURE WITH OXYGEN DEPENDENCE: You need oxygen therapy, but your nighttime oxygen requirement has decreased recently. Fatigue may be related to low iron levels. -We are awaiting approval for IV iron infusion to help with your fatigue. -Continue your current oxygen therapy regimen.  MALNUTRITION WITH LOW BODY WEIGHT: You have gained some weight recently, but GERD makes eating difficult. -Continue efforts to gain weight and monitor your dietary intake. -Address GERD symptoms to improve dietary tolerance.

## 2024-06-07 NOTE — Progress Notes (Signed)
 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 who  has a past medical history of Acute liver failure, Anemia, Anorexia nervosa (HCC), CAP (community acquired pneumonia), Cardiomyopathy (HCC), CHF (congestive heart failure) (HCC), Difficult intubation, Fibromyalgia, Fibromyalgia syndrome, GERD (gastroesophageal reflux disease), IBS (irritable bowel syndrome), MS (multiple sclerosis), PTSD (post-traumatic stress disorder), Sacral decubitus ulcer, Shock circulatory (HCC), and TMJ disease.  Discussed the use of AI scribe software for clinical note transcription with the patient, who gave verbal consent to proceed.  History of Present Illness [name] is a 63 year old female with a cavitary lung lesion, nodular opacities, COPD and GERD  She has been experiencing persistent cough and worsening GERD symptoms for the past two weeks after consuming fatty foods that are not GERD-friendly. Her usual medication, omeprazole  40 mg twice daily, which has kept her stable for years, is no longer effective. She experiences constant heartburn and GERD symptoms that are higher in her throat than usual, causing significant discomfort during activities such as bending, talking, and eating. She has been aspirating into her lungs daily, particularly at night, despite sleeping at a 90-degree angle. She has increased her omeprazole  dose by taking an extra pill at night, but it has not alleviated her symptoms.  She has a history of abnormal CT with cavitary lesion in the left upper lobe with mycetoma and nodular opacities in the lower lobe that has been under investigation since April  2025. She underwent navigational bronchoscopy and BAL, biopsies by Dr. Shelah on 01/25/2024.  Path showed granulomatous inflammation with Pseudomonas [3K colonies], Aspergillus and MAC growing on cultures.  No evidence of malignancy.  Decision made in consultation with infectious disease to defer treatment for MAC due to concern for drug side effects as she is already struggling with weight loss and malnutrition  Cardiopulmonary and neurological comorbidities - History of severe hospitalization in 2012-2013 for respiratory failure, liver failure, and septic shock, requiring ventilator support and tracheostomy - History of congestive heart failure and cardiomyopathy - Stage four sacral wound in past medical history - Family history of multiple sclerosis, with concern for neurological weakness and pain  Interim history: [name] is a 63 year old female with chronic lung conditions who presents with recent exacerbation of respiratory symptoms.  Respiratory symptoms and exacerbation - Non-CF bronchiectasis with chronic Pseudomonas, Aspergillus, and MAC infections - Recent exacerbation characterized by acute respiratory distress and sensation of left lung 'seizing up' - Initial treatment with ciprofloxacin  and prednisone  for 10 days resulted in marked improvement within 36 hours, but symptoms worsened by day 8 - Completed a second 14-day course of ciprofloxacin  without prednisone , which stabilized breathing - During flare, experienced dark green mucus and persistent cough - Post-treatment, sputum is now light yellow to beige and cough is improved but ongoing - Maintenance therapy with Spiriva .  Previously did not tolerate Stiolto - Nighttime oxygen requirement decreased from 3 L to 2 L  Airway clearance and pulmonary hygiene - Limits use of hypertonic saline nebulizer as she does not like it. - Relies on huff coughing for airway clearance, which is effective - Not using  percussion vest due to muscle pain and  TMJ problems - TMJ issues also restrict use of devices such as the flutter valve  Fatigue and functional status - Significant fatigue present.  Diagnosed with iron deficiency recently - Awaiting IV iron therapy  Nutritional status and gastrointestinal symptoms - Weight increased from 100 to 103 pounds - GERD continues to make eating difficult  Outpatient Encounter Medications as of 06/07/2024  Medication Sig   acetaminophen  (TYLENOL ) 325 MG tablet Take 975 mg by mouth daily.   bisoprolol  (ZEBETA ) 5 MG tablet TAKE 1/2 TABLET BY MOUTH DAILY   cholecalciferol  (VITAMIN D3) 25 MCG (1000 UNIT) tablet Take 2,000 Units by mouth daily.   dexlansoprazole  (DEXILANT ) 60 MG capsule Take 1 capsule (60 mg total) by mouth daily.   famotidine  (PEPCID ) 20 MG tablet Take 1 tablet (20 mg total) by mouth at bedtime.   ipratropium-albuterol  (DUONEB) 0.5-2.5 (3) MG/3ML SOLN Take 3 mLs by nebulization every 6 (six) hours as needed.   irbesartan  (AVAPRO ) 75 MG tablet TAKE ONE TABLET BY MOUTH ONCE DAILY   levalbuterol  (XOPENEX  HFA) 45 MCG/ACT inhaler Inhale up to 2 puffs every 4 hours as needed for shortness of breath or wheezing.   Magnesium  250 MG TABS Take 250 mg by mouth daily.   Multiple Vitamins-Minerals (MULTIVITAMIN WOMEN 50+) TABS Take 1 tablet by mouth daily.   OXYGEN Inhale 2-3 L/min into the lungs continuous.   predniSONE  (DELTASONE ) 10 MG tablet Take 40 mg by mouth as needed.   SPIRIVA  RESPIMAT 2.5 MCG/ACT AERS SMARTSIG:2 Spray(s) By Mouth Daily   tiotropium (SPIRIVA ) 18 MCG inhalation capsule Place 18 mcg into inhaler and inhale daily.   azithromycin  (ZITHROMAX ) 250 MG tablet Take 500 mg on day 1 and then 250 mg/day for the next 4 days (Patient not taking: Reported on 06/07/2024)   fluticasone  (FLONASE ) 50 MCG/ACT nasal spray Place 2 sprays into both nostrils daily. (Patient not taking: Reported on 06/07/2024)   rosuvastatin  (CRESTOR ) 10 MG tablet Take 1 tablet  (10 mg total) by mouth daily. (Patient not taking: Reported on 06/07/2024)   Facility-Administered Encounter Medications as of 06/07/2024  Medication   ipratropium-albuterol  (DUONEB) 0.5-2.5 (3) MG/3ML nebulizer solution 3 mL   Vitals:   06/07/24 1559  BP: 106/62  Pulse: 72  Temp: 98 F (36.7 C)  Height: 5' 4 (1.626 m)  Weight: 103 lb (46.7 kg)  SpO2: 100% Comment: 2l tank  TempSrc: Oral  BMI (Calculated): 17.67     Physical Exam  MEASUREMENTS: Weight- 103. GEN: No acute distress CV: Regular rate and rhythm no murmurs LUNGS: Clear to auscultation bilaterally normal respiratory effort SKIN JOINTS: Warm and dry no rash    Data Reviewed: Imaging:  CT chest 10/28/2023-thick-walled cavitary lesion in the left lung apex with internal septation and nodularity, new multifocal areas of mucous plugging, nodules and groundglass with lower lung predominance.  CT chest 12/20/2023- Persistent thick-walled cavity in the left lung apex with worsening areas of nodules and groundglass opacities in the lower lungs.  PET scan 01/18/2024-multifocal nodular opacity with left upper lobe cavitation with waxing and waning nature in the lower lobes.  CT scan 03/03/2024-stable left upper lobe cavitation with intracavity mycetoma.  Improving lower lobe nodular opacities and inflammatory lesion, bronchiectasis I have reviewed the images personally.  PFTs: 08/01/2013-significant obstruction  Labs: Bronchoscopy 01/25/2024 Cytology-granulomatous inflammation and right lower lobe.  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 Normal aspergillus antigen levels argue against  invasive infection. Current CT scan shows no progression, suggesting stability. The lesion may contribute to dyspnea. - Monitor symptoms and CT scans for changes in cavitary lesion size or complications such as hemoptysis.  Bronchiectasis with chronic cough and sputum production, complicated by chronic Pseudomonas colonization and pulmonary Mycobacterium avium complex (MAC) infection Chronic bronchiectasis with persistent cough and sputum production. Recent increase in sputum production, darker green color, and hemoptysis. Low Pseudomonas colonization and presence of MAC infection. Current CT scan shows slight improvement in nodules. Treatment for MAC is prolonged and may cause weight loss and side effects. Decision to treat MAC deferred due to concern for side effects.  Insurance denied Brinsupri for non-CF bronchiectasis.  She is unable to tolerate percussion vest or flutter valve.  Hypertonic saline nebs on hold per patient preference. - Continue mucociliary clearance as she is doing now - Follow-up CT scan scheduled for later in December 2025.  Chronic obstructive pulmonary disease (COPD) COPD with chronic cough and sputum production. Current management includes Spiriva , which will be switched to Stiolto for enhanced bronchodilation without steroids. -Continue Spiriva   Unintentional weight loss and malnutrition risk Continued weight loss and difficulty gaining weight due to increased energy expenditure from coughing and dietary restrictions from GERD. Previous PEG tube placement during hospitalization in 2012-2013 for respiratory failure and other complications. Considering PEG tube replacement if weight loss continues. Currently managing weight through dietary intake but considering PEG tube replacement if unable to maintain weight. - Monitor weight and nutritional intake.  Gastroesophageal reflux disease (GERD) GERD managed with Dexilant , effective without causing diarrhea. GERD contributes to  dietary restrictions and may exacerbate coughing. - Continue Dexilant  for GERD management.  Recommendations: Continue Spiriva , nebs Follow-up CT scan  I personally spent a total of 40 minutes in the care of the patient today including preparing to see the patient, getting/reviewing separately obtained history, referring and communicating with other health care professionals, documenting clinical information in the EHR, independently interpreting results, communicating results, and coordinating care.   Lonna Coder MD [facility] 06/07/2024, 4:06 PM  CC: Aisha Harvey, MD

## 2024-06-09 NOTE — Telephone Encounter (Signed)
 Called insurance company to seek an appeal. They do not have PA on file for 10mg  tablet. Will initiate Brinsupri 10mg  PA as below. Will likely have to appeal when that PA is denied, as per insurance representative, the plan does not cover this medication.   Submitted a Prior Authorization request to [employer] for BRINSUPRI via CoverMyMeds. Will update once we receive a response.  KeyMILEAH HEMMER (Key: [id number])  Will update in this thread when I receive a response.

## 2024-06-12 ENCOUNTER — Other Ambulatory Visit (HOSPITAL_COMMUNITY): Payer: Self-pay | Admitting: Family Medicine

## 2024-06-12 ENCOUNTER — Encounter: Payer: Self-pay | Admitting: Pulmonary Disease

## 2024-06-12 DIAGNOSIS — D509 Iron deficiency anemia, unspecified: Secondary | ICD-10-CM | POA: Insufficient documentation

## 2024-06-12 DIAGNOSIS — E611 Iron deficiency: Secondary | ICD-10-CM | POA: Insufficient documentation

## 2024-06-12 NOTE — Telephone Encounter (Signed)
 Called [employer] to request a denial letter so that I can initiate appeal.

## 2024-06-12 NOTE — Telephone Encounter (Signed)
 PA for Brinsupri 10mg  result - N/A due to the following:   We received your request for prior authorization for BRINSUPRI , for the above member; however, [employer] has a denied request on file for BRINSUPRI for this member. Please follow the appeals process outlined in the original denial or contact Prior Authorization Department at [phone] for further questions.  I was told I could not submit an appeal unless a PA was on file for Brinsupri 10mg  tablet. Will retry appeal.

## 2024-06-13 ENCOUNTER — Telehealth: Payer: Self-pay

## 2024-06-13 ENCOUNTER — Telehealth: Payer: Self-pay | Admitting: Pharmacy Technician

## 2024-06-13 NOTE — Telephone Encounter (Addendum)
 Preferred medication is venofer. Feraheme will be denied since patient has not failed Venofer. Left v/m with MD office. (Dr. Aisha) awaiting a call-back   Auth Submission: no auth needed Site of care: Site of care: [facility] Payer: [employer] Medication & CPT/J Code(s) submitted: Venofer (Iron Sucrose) J1756 Diagnosis Code: D50.9 Route of submission (phone, fax, portal):  Phone # [phone] Fax # Auth type: Buy/Bill PB Units/visits requested:  Reference number:  Approval from: 06/13/24 to 09/02/24

## 2024-06-13 NOTE — Telephone Encounter (Signed)
 Patient does not have a dme company; gets oxygen from insurance. She is requesting a script for a home concentrator. Okay to print script?

## 2024-06-13 NOTE — Telephone Encounter (Signed)
 Called pt- Sent message as a telephone encounter to Dr Theophilus. Awaiting response

## 2024-06-14 ENCOUNTER — Ambulatory Visit
Admission: RE | Admit: 2024-06-14 | Discharge: 2024-06-14 | Disposition: A | Discharge status: Home / Self Care | Source: Ambulatory Visit | Attending: Nurse Practitioner | Admitting: Nurse Practitioner

## 2024-06-14 ENCOUNTER — Telehealth (HOSPITAL_COMMUNITY): Payer: Self-pay | Admitting: Pharmacy Technician

## 2024-06-14 ENCOUNTER — Telehealth: Payer: Self-pay | Admitting: Nurse Practitioner

## 2024-06-14 ENCOUNTER — Other Ambulatory Visit: Payer: Self-pay

## 2024-06-14 DIAGNOSIS — J479 Bronchiectasis, uncomplicated: Secondary | ICD-10-CM

## 2024-06-14 DIAGNOSIS — J984 Other disorders of lung: Secondary | ICD-10-CM

## 2024-06-14 NOTE — Telephone Encounter (Signed)
 Husband came into office and picked up printed copy of prescription.

## 2024-06-14 NOTE — Telephone Encounter (Signed)
 Auth Submission: APPROVED Site of care: [facility] Payer: [employer] Medication & CPT/J Code(s) submitted: Feraheme (ferumoxytol) [id number] Diagnosis Code: D50.9 Route of submission (phone, fax, portal): portal Phone # Fax # Auth type: Buy/Bill HB Units/visits requested: 510mg  x 2 doses Reference number: [id number] Approval from: 05/31/24 to 11/28/24    Dagoberto Armour, CPhT [facility] Phone: [phone] 06/14/2024

## 2024-06-16 NOTE — Telephone Encounter (Signed)
 Does not appear we have received the fax with denial letter -   Called [employer] and requested the denial letter to be refaxed to [phone]. I can initiate denial when I receive this letter.

## 2024-06-20 ENCOUNTER — Inpatient Hospital Stay (HOSPITAL_COMMUNITY): Admission: RE | Admit: 2024-06-20 | Discharge: 2024-06-20 | Attending: Family Medicine

## 2024-06-20 ENCOUNTER — Telehealth: Payer: Self-pay

## 2024-06-20 VITALS — BP 96/53 | HR 67 | Temp 98.3°F | Resp 17

## 2024-06-20 DIAGNOSIS — D509 Iron deficiency anemia, unspecified: Secondary | ICD-10-CM

## 2024-06-20 DIAGNOSIS — E611 Iron deficiency: Secondary | ICD-10-CM | POA: Diagnosis present

## 2024-06-20 MED ORDER — SODIUM CHLORIDE 0.9 % IV SOLN
510.0000 mg | Freq: Once | INTRAVENOUS | Status: AC
  Administered 2024-06-20: 13:00:00 510 mg via INTRAVENOUS
  Filled 2024-06-20: qty 17

## 2024-06-20 NOTE — Telephone Encounter (Signed)
 Submitted URGENT appeal to [employer] for BRINSUPRI.   Fax: [phone]  Will await response.

## 2024-06-20 NOTE — Telephone Encounter (Signed)
 Copied from CRM #[id number]. Topic: General - Other >> Jun 20, 2024 12:35 PM Whitney O wrote: Reason for CRM: lisa with [employer] pharmacy appeals is calling about the medication brinsupri it was not approved . The case reference  number they will need when they call [id number] . Any next steps they can get from the agent when you call  [phone]

## 2024-06-21 ENCOUNTER — Other Ambulatory Visit (HOSPITAL_COMMUNITY): Payer: Self-pay | Admitting: Cardiology

## 2024-06-21 DIAGNOSIS — R1900 Intra-abdominal and pelvic swelling, mass and lump, unspecified site: Secondary | ICD-10-CM

## 2024-06-21 DIAGNOSIS — I5022 Chronic systolic (congestive) heart failure: Secondary | ICD-10-CM

## 2024-06-21 DIAGNOSIS — R0602 Shortness of breath: Secondary | ICD-10-CM

## 2024-06-26 ENCOUNTER — Encounter (HOSPITAL_COMMUNITY): Payer: Self-pay | Admitting: Family Medicine

## 2024-06-26 ENCOUNTER — Other Ambulatory Visit (HOSPITAL_COMMUNITY): Payer: Self-pay

## 2024-06-27 ENCOUNTER — Ambulatory Visit (HOSPITAL_COMMUNITY)
Admission: RE | Admit: 2024-06-27 | Discharge: 2024-06-27 | Disposition: A | Discharge status: Home / Self Care | Source: Ambulatory Visit | Attending: Family Medicine | Admitting: Family Medicine

## 2024-06-27 VITALS — BP 91/42 | HR 84 | Temp 98.5°F | Resp 16

## 2024-06-27 DIAGNOSIS — D509 Iron deficiency anemia, unspecified: Secondary | ICD-10-CM | POA: Diagnosis not present

## 2024-06-27 DIAGNOSIS — E611 Iron deficiency: Secondary | ICD-10-CM

## 2024-06-27 MED ORDER — SODIUM CHLORIDE 0.9 % IV SOLN
510.0000 mg | Freq: Once | INTRAVENOUS | Status: AC
  Administered 2024-06-27: 510 mg via INTRAVENOUS
  Filled 2024-06-27: qty 510

## 2024-07-07 ENCOUNTER — Telehealth: Admitting: Nurse Practitioner

## 2024-07-07 ENCOUNTER — Encounter: Payer: Self-pay | Admitting: Nurse Practitioner

## 2024-07-07 ENCOUNTER — Ambulatory Visit: Admitting: Nurse Practitioner

## 2024-07-07 DIAGNOSIS — E43 Unspecified severe protein-calorie malnutrition: Secondary | ICD-10-CM

## 2024-07-07 DIAGNOSIS — J9611 Chronic respiratory failure with hypoxia: Secondary | ICD-10-CM

## 2024-07-07 DIAGNOSIS — J984 Other disorders of lung: Secondary | ICD-10-CM

## 2024-07-07 DIAGNOSIS — Z87891 Personal history of nicotine dependence: Secondary | ICD-10-CM

## 2024-07-07 DIAGNOSIS — A310 Pulmonary mycobacterial infection: Secondary | ICD-10-CM

## 2024-07-07 DIAGNOSIS — J479 Bronchiectasis, uncomplicated: Secondary | ICD-10-CM

## 2024-07-07 NOTE — Patient Instructions (Signed)
 Continue levalbuterol  inhaler 2 puffs or duoneb 3 mL neb every 6 hours as needed for shortness of breath or wheezing. Notify if symptoms persist despite rescue inhaler/neb use.  Use duoneb treatments 3-4 times a day then use hypertonic saline nebs twice a day followed by flutter valve Continue spiriva  2 puffs daily Continue supplemental oxygen 2.5 lpm for goal >88-90%.  Continue Guaifenesin (Mucinex) 1200 mg Twice daily for cough/congestion Continue dexilant  and pepcid  as prescribed   I have sent a message to the pharmacy team regarding your Brinsupri and the denial   Glad you are feeling better  Follow up with infectious disease as scheduled    Follow up as scheduled with Dr. Theophilus. f symptoms do not improve or worsen, please contact office for sooner follow up or seek emerge

## 2024-07-07 NOTE — Assessment & Plan Note (Signed)
 Stable without increased O2 requirements

## 2024-07-07 NOTE — Telephone Encounter (Signed)
 Pt received Brinsupri denial from her insurance. She has had multiple exacerbations requiring abx in the last 6 months and has very severe obstructive lung disease related to non CF bronchiectasis. The patient did mentioned insurance had listed an antibiotic as alternative, which I assume may be azithromycin ? Chronic macrolide therapy with azithromycin  would not be recommended given her cardiac history (cardiomyopathy and CHF) and risk of QT prolongation. Any long term antibiotic use would increase rate of resistance, in someone who is already higher risk for recurrent resistant infections. She is an appropriate candidate for Brinsupri. I have not seen the denial so unclear what their reasoning is. Can we follow up on this? If insurance won't cover, is there a patient assistance option? Thanks!

## 2024-07-07 NOTE — Assessment & Plan Note (Signed)
 Weight loss stabilized. Up 6 lb since last illness. Continue to monitor closely. Encouraged high calorie, high protein diet

## 2024-07-07 NOTE — Assessment & Plan Note (Addendum)
 Significant bronchiectasis with severe obstructive lung disease. Prone to recurrent exacerbations; last treated 05/2024. Difficulties tolerating certain mucociliary clearance therapies. Stable today. Encouraged to continue utilizing hypertonic saline, as able, and bronchodilatory support. Monitor for s/s of recurrent infection. ED/return precautions reviewed. Will reach out to pharmacy team to f/u with insurance on Brinsupri denial. She has had multiple exacerbations in the last 6-12 months, requiring antibiotics, and is high risk for recurrence. Action plan in place. CT chest reassuring and stable to improved.   Patient Instructions  Continue levalbuterol  inhaler 2 puffs or duoneb 3 mL neb every 6 hours as needed for shortness of breath or wheezing. Notify if symptoms persist despite rescue inhaler/neb use.  Use duoneb treatments 3-4 times a day then use hypertonic saline nebs twice a day followed by flutter valve Continue spiriva  2 puffs daily Continue supplemental oxygen 2.5 lpm for goal >88-90%.  Continue Guaifenesin (Mucinex) 1200 mg Twice daily for cough/congestion Continue dexilant  and pepcid  as prescribed   I have sent a message to the pharmacy team regarding your Brinsupri and the denial   Glad you are feeling better  Follow up with infectious disease as scheduled    Follow up as scheduled with Dr. Theophilus. f symptoms do not improve or worsen, please contact office for sooner follow up or seek emerge

## 2024-07-07 NOTE — Progress Notes (Signed)
 "  @Patient  ID: [name], female    DOB: [birth year], 62 y.o.   MRN: [id number]  No chief complaint on file.   Referring provider: Aisha Harvey, MD  Virtual Visit via Video Note  I connected with [name] on 07/07/2024 at  3:00 PM EST by a video enabled telemedicine application and verified that I am speaking with the correct person using two identifiers.  Location: Patient: Home Provider: Office   I discussed the limitations of evaluation and management by telemedicine and the availability of in person appointments. The patient expressed understanding and agreed to proceed.  History of Present Illness: 64 year old female, former smoker followed for cavitary lung disease, bronchopulmonary aspergillus, MAC, COPD mixed type, hx of pseudomonas. She is a patient of Dr. Jiles and last seen in office 06/07/2024. Past medical history significant for cardiomyopathy, HF.   TEST/EVENTS:  01/25/2024 cytology with granulomatous inflammation and RLL. Fungal elements consistent with aspergillus in LUL; cultures positive for pansensitive pseudomonas, MAC, and aspergillus  03/03/2024 CT chest: stable LUL cavitation with intracavity mycetoma. Improving LL nodular opacities and inflammatory lesion, btx.   03/07/2024: OV with Dr. Theophilus. Respiratory symptoms with sputum changes. Increased cough with significant mucus production; change from yellow to darker green with blood. Unusual sweating and chills. Unable to use flutter valve due to jaw issues. Recent identification of MAC disease, pending further consultation regarding need for tx. Previous aspergillus antigen tests normal. Significant weight loss with difficulty regaining. Switch Spiriva  to Stiolto. Start nebulized hypertonic saline. Provide sputum cup for culture. Order vest therapy. Prescribed azithromycin , prednisone  and dexamethasone  for flare ups.   04/14/2024: OV with [name] Baum NP COPD with acute exacerbation due to  Pseudomonas infection and possible pneumonia MAC Bronchopulmonary aspergillus  Acute exacerbation of COPD likely due to Pseudomonas infection, with clinical symptoms suggestive of possible pneumonia. Productive cough with green sputum. COVID and flu tests negative; however, suspect possible viral etiology given constellation of symptoms. Lung exam concerning for pneumonia. Ciprofloxacin  chosen due to sensitivity and past tolerance. Chest x-ray shows no acute worsening, awaiting final radiology read. Discussed potential side effects of ciprofloxacin . Mucociliary clearance therapies encouraged. VS stable and non-toxic appearing in office; felt appropriate for outpatient management. Strict ED/return precautions reviewed. Verbalized understanding.  - Prescribe ciprofloxacin  for 10 days. - Consult with infectious disease if symptoms persist or worsen for potential MAC tx - Continue bronchodilator regimen  - Mucociliary clearance therapies; advised to notify if vest not received by next week  Pleurisy Pleuritic pain likely due to irritation and inflammation of the lung lining, associated with current lung infection. Pain located under the breast and to the side, correlating with lung exam findings - Prescribe prednisone  and provide tapering instructions. - Supportive care  Chest pain EKG without acute cardiac cause. See above. ED precautions reviewed. Recent echocardiogram stable. Euvolemic on exam.    05/11/2024: OV with [name] Rehm NP [name] [name] is a 64 year old female with recurrent pneumonia who presents for follow up. She is still feeling better since our last visit. Within 36 hours after starting medication, she was improved with resolution of the chest pain and pleurisy she was experiencing.  Two days before finishing her medication, she added almond butter to her diet, which she suspects may have triggered her recurrent symptoms that she is now experiencing. About 30 minutes after  consuming a smoothie with almond butter, she began coughing, and was worried she aspirated. Since then, she's had increased mucus production. Initially,  the mucus was yellow, then green, and now dark green, with occasional streaky blood, though no hemoptysis in the past few days. She's worried she has pneumonia again.  She has experienced chills and unusual daytime sweats, which started two days ago, similar to symptoms she had during her last three pneumonias. She feels something is 'brewing' in her lungs, possibly due to aspiration of her smoothie. She can feel the issue in her lower lobe, which is a familiar sensation from previous aspiration pneumonias. Her oxygen levels drop into the 80s when she stands to do chores despite wearing her oxygen. She does recover quickly with rest. She's wearing 2-2.5 lpm. Her sinuses are now producing green and unpleasant mucus, which is unusual for her. No fevers. She is still eating and drinking well. No wheezing or chest tightness. Her breathing feels just slightly more winded with exertion. No calf pain/swelling/redness.  She is not currently using a flutter valve or doing breathing treatments. She's not taking guaifenesin right now. She is using her Spiriva  inhaler daily.   05/25/2024: Ov with [name] Huneke NP. [name] [name] is a 64 year old female with bronchiectasis who presents for follow up. She was treated for slow to resolve flare at our last OV with ciprofloxacin .  Her cough is improved; occasional production of light green phlegm with some beige to brown that may be old blood. No bright blood. The cough is less severe during the day. Still gets worse at night. She sleeps at a ninety-degree angle due to past aspiration experiences. No fevers, hemoptysis, chills, weight loss, anorexia. Her gastroesophageal reflux disease (GERD) sometimes exacerbates her respiratory symptoms. She is taking Dexilant  and famotidine  for GERD management. The reflux is  positional, worsening when she lowers her head. The famotidine  at night does help but still occasionally has some breakthrough symptoms. No dysphagia, abd pain, changes in bowel habits.  She has a history of bronchiectasis, leading to recurrent infections requiring abx. She manages her condition with nebulizer treatments and a flutter valve to clear mucus. She is due for a CT in December for 3 month follow up. Dr. Overton, with ID, has wanted her to get a scan every 3-4 months for monitoring.  She was never able to return sputum culture after our last visit. No increased oxygen requirements. Sinus symptoms resolved.   06/07/2024: Ov with Dr. Theophilus. Post treatment, sputum is now light yellow to beige and cough is improved. Did not tolerate Stiolto; on Spiriva . Prior CT without progression; shows stability. Repeat scheduled later in December. Insurance denied Brinsupri for btx. Unable to tolerate percussion therapy. Weight stabilized.   07/07/2024: Today - follow up Patient presents today via virtual visit. She is feeling much better compared to when I saw her last. She had a repeat CT chest scan that showed some improved areas and other areas of stability. She has a stable, chronic productive cough with yellow phlegm. No recent fevers, hemoptysis, anorexia, weight loss, CP. She's actually gained about 6 lb. She is using her Spiriva . GERD symptoms stable. Insurance has denied Brinsupri, per her report.   Allergies  Allergen Reactions   Transderm-Scop [Scopolamine] Anaphylaxis, Shortness Of Breath and Other (See Comments)    Respiratory distress   Zestril  [Lisinopril ] Shortness Of Breath and Cough   Cymbalta [Duloxetine Hcl] Other (See Comments)    Unknown reaction   Ivp Dye [Iodinated Contrast Media] Other (See Comments)    Unknown reaction   Levaquin  [Levofloxacin ] Other (See Comments)    Numbness hands  and feet   Other Other (See Comments)    Do not put pharmaceutical patches on skin   Coreg   [Carvedilol ] Rash    Immunization History  Administered Date(s) Administered   Moderna Covid-19 Vaccine Bivalent Booster 71yrs & up 03/21/2021   Moderna SARS-COV2 Booster Vaccination 07/24/2020, 11/22/2020   Moderna Sars-Covid-2 Vaccination 09/15/2019, 10/13/2019, 02/17/2020, 07/24/2020, 11/22/2020, 04/04/2022   PNEUMOCOCCAL CONJUGATE-20 12/12/2020   PPD Test 11/10/2023   Tdap 10/21/2022    Past Medical History:  Diagnosis Date   Acute liver failure    Anemia    Anorexia nervosa (HCC)    h/o    CAP (community acquired pneumonia)    Cardiomyopathy (HCC)    CHF (congestive heart failure) (HCC)    Difficult intubation    secondary to jaw muscles   Fibromyalgia    Fibromyalgia syndrome    GERD (gastroesophageal reflux disease)    IBS (irritable bowel syndrome)    MS (multiple sclerosis)    PTSD (post-traumatic stress disorder)    Sacral decubitus ulcer    Shock circulatory (HCC)    TMJ disease     Tobacco History: Social History   Tobacco Use  Smoking Status Former   Current packs/day: 0.00   Average packs/day: 1 pack/day for 11.0 years (11.0 ttl pk-yrs)   Types: Cigarettes   Start date: 73   Quit date: 07/07/1987   Years since quitting: 37.0  Smokeless Tobacco Never   Counseling given: Not Answered   Outpatient Medications Prior to Visit  Medication Sig Dispense Refill   acetaminophen  (TYLENOL ) 325 MG tablet Take 975 mg by mouth daily.     azithromycin  (ZITHROMAX ) 250 MG tablet Take 500 mg on day 1 and then 250 mg/day for the next 4 days (Patient not taking: Reported on 06/07/2024) 6 tablet 3   bisoprolol  (ZEBETA ) 5 MG tablet TAKE 1/2 TABLET BY MOUTH DAILY 45 tablet 3   cholecalciferol  (VITAMIN D3) 25 MCG (1000 UNIT) tablet Take 2,000 Units by mouth daily.     dexlansoprazole  (DEXILANT ) 60 MG capsule Take 1 capsule (60 mg total) by mouth daily. 90 capsule 3   famotidine  (PEPCID ) 20 MG tablet Take 1 tablet (20 mg total) by mouth at bedtime.     fluticasone   (FLONASE ) 50 MCG/ACT nasal spray Place 2 sprays into both nostrils daily. (Patient not taking: Reported on 06/07/2024) 18.2 mL 2   ipratropium-albuterol  (DUONEB) 0.5-2.5 (3) MG/3ML SOLN Take 3 mLs by nebulization every 6 (six) hours as needed. 360 mL 3   irbesartan  (AVAPRO ) 75 MG tablet TAKE ONE TABLET BY MOUTH ONCE DAILY 30 tablet 11   levalbuterol  (XOPENEX  HFA) 45 MCG/ACT inhaler Inhale up to 2 puffs every 4 hours as needed for shortness of breath or wheezing. 15 g 3   Magnesium  250 MG TABS Take 250 mg by mouth daily.     Multiple Vitamins-Minerals (MULTIVITAMIN WOMEN 50+) TABS Take 1 tablet by mouth daily.     OXYGEN Inhale 2-3 L/min into the lungs continuous.     predniSONE  (DELTASONE ) 10 MG tablet Take 40 mg by mouth as needed.     rosuvastatin  (CRESTOR ) 10 MG tablet Take 1 tablet (10 mg total) by mouth daily. (Patient not taking: Reported on 06/07/2024) 90 tablet 3   SPIRIVA  RESPIMAT 2.5 MCG/ACT AERS SMARTSIG:2 Spray(s) By Mouth Daily     tiotropium (SPIRIVA ) 18 MCG inhalation capsule Place 18 mcg into inhaler and inhale daily.     Facility-Administered Medications Prior to Visit  Medication Dose Route Frequency  Provider Last Rate Last Admin   ipratropium-albuterol  (DUONEB) 0.5-2.5 (3) MG/3ML nebulizer solution 3 mL  3 mL Nebulization Q6H PRN Mannam, Praveen, MD         Review of Systems: as above    Physical Exam:  Patient is well-developed, well-nourished in no acute distress.  Resting comfortably at home.  No labored breathing. Wearing supplemental O2 Speech is clear and coherent with logical content.  Patient is alert and oriented at baseline.     Lab Results:  CBC    Component Value Date/Time   WBC 5.7 01/25/2024 1149   RBC 4.25 01/25/2024 1149   HGB 11.7 (L) 01/25/2024 1149   HCT 36.7 01/25/2024 1149   PLT 327 01/25/2024 1149   MCV 86.4 01/25/2024 1149   MCH 27.5 01/25/2024 1149   MCHC 31.9 01/25/2024 1149   RDW 13.7 01/25/2024 1149   LYMPHSABS 0.8 11/15/2023  1151   MONOABS 0.3 11/15/2023 1151   EOSABS 0 (L) 12/14/2023 1629   BASOSABS 0 12/14/2023 1629    BMET    Component Value Date/Time   NA 137 01/25/2024 1149   K 3.6 01/25/2024 1149   CL 99 01/25/2024 1149   CO2 25 01/25/2024 1149   GLUCOSE 82 01/25/2024 1149   BUN 14 01/25/2024 1149   CREATININE 0.54 01/25/2024 1149   CREATININE 0.57 07/02/2018 1034   CALCIUM  9.5 01/25/2024 1149   GFRNONAA >60 01/25/2024 1149   GFRAA >60 11/03/2019 1442    BNP    Component Value Date/Time   BNP 21.2 03/22/2019 1527   BNP 16.1 05/27/2016 1623     Imaging:  CT Chest Wo Contrast Result Date: 06/21/2024 CLINICAL DATA:  Follow-up atypical pulmonary infection. History of bronchiectasis with chronic Pseudomonas and MAI infection. Cavitary left upper lobe lesion with aspergillosis. EXAM: CT CHEST WITHOUT CONTRAST TECHNIQUE: Multidetector CT imaging of the chest was performed following the standard protocol without IV contrast. RADIATION DOSE REDUCTION: This exam was performed according to the departmental dose-optimization program which includes automated exposure control, adjustment of the mA and/or kV according to patient size and/or use of iterative reconstruction technique. COMPARISON:  Multiple prior imaging studies. The most recent CT scan is 03/03/2024 FINDINGS: Cardiovascular: The heart is normal in size. No pericardial effusion. The aorta is normal in caliber. No atherosclerotic calcifications. Scattered coronary artery calcifications. The pulmonary arteries are slightly prominent but stable. Mediastinum/Nodes: Scattered mediastinal and hilar lymph nodes, likely reactive but no overt adenopathy or interval change. The esophagus is unremarkable. Lungs/Pleura: Stable chronic lung disease. Thick walled cavitary lesion in the left upper lobe is unchanged. The small adjacent cavity anteriorly is now more solid. Stable central aspergilloma with calcification. Stable 10 mm nodular lesion in the left upper  lobe medially image number 32/4. Persistent but slight improved lower lobe predominant patchy areas of tree-in-bud changes consistent with MAI. Right middle lobe bronchiectasis with mucoid impaction appears relatively stable. There are few similar changes in the lingula and left lower lobe. The area of dense airspace consolidation seen on the prior study in the right lower lobe shows improvement. There are now discrete air waist filled with debris/mucoid impaction. Peripheral nodular lesion in the left lower lobe measures 14 x 12 mm and previously measured 18 x 15 mm. The adjacent nodule is also smaller measuring 5 mm compared to prior 10 mm. Calcified lesion in the right middle lobe is stable. No pleural effusions or pleural lesions. Upper Abdomen: Stable aortic calcifications. No upper mass or adenopathy. Musculoskeletal: No  breast masses, supraclavicular or axillary adenopathy. Mild stable thyromegaly. The bony thorax is intact. IMPRESSION: 1. Stable chronic lung disease with thick walled cavitary lesion in the left upper lobe. The small adjacent cavity anteriorly is now more solid. Stable central aspergilloma with calcification. 2. Persistent but slight improved lower lobe predominant patchy areas of tree-in-bud changes consistent with MAI. 3. Stable right middle lobe bronchiectasis with mucoid impaction. 4. Improved right lower lobe airspace consolidation. 5. Stable 10 mm nodular lesion in the left upper lobe medially. 6. Right lower lobe nodular lesions are slightly smaller. 7. Stable scattered mediastinal and hilar lymph nodes, likely reactive but no overt adenopathy or interval change. 8. Overall slight improved disease process. Aortic Atherosclerosis (ICD10-I70.0). Electronically Signed   By: MYRTIS Stammer M.D.   On: 06/21/2024 13:56    Administration History     None          Latest Ref Rng & Units 08/01/2013    9:53 AM  PFT Results  FVC-Pre L 1.87   FVC-Predicted Pre % 50   FVC-Post L 1.75    FVC-Predicted Post % 47   Pre FEV1/FVC % % 52   Post FEV1/FCV % % 65   FEV1-Pre L 0.98   FEV1-Predicted Pre % 33   FEV1-Post L 1.14   TLC L 4.33   TLC % Predicted % 81   RV % Predicted % 122     No results found for: NITRICOXIDE      Assessment & Plan:  Bronchiectasis without complication (HCC) Significant bronchiectasis with severe obstructive lung disease. Prone to recurrent exacerbations; last treated 05/2024. Difficulties tolerating certain mucociliary clearance therapies. Stable today. Encouraged to continue utilizing hypertonic saline, as able, and bronchodilatory support. Monitor for s/s of recurrent infection. ED/return precautions reviewed. Will reach out to pharmacy team to f/u with insurance on Brinsupri denial. She has had multiple exacerbations in the last 6-12 months, requiring antibiotics, and is high risk for recurrence. Action plan in place. CT chest reassuring and stable to improved.   Patient Instructions  Continue levalbuterol  inhaler 2 puffs or duoneb 3 mL neb every 6 hours as needed for shortness of breath or wheezing. Notify if symptoms persist despite rescue inhaler/neb use.  Use duoneb treatments 3-4 times a day then use hypertonic saline nebs twice a day followed by flutter valve Continue spiriva  2 puffs daily Continue supplemental oxygen 2.5 lpm for goal >88-90%.  Continue Guaifenesin (Mucinex) 1200 mg Twice daily for cough/congestion Continue dexilant  and pepcid  as prescribed   I have sent a message to the pharmacy team regarding your Brinsupri and the denial   Glad you are feeling better  Follow up with infectious disease as scheduled    Follow up as scheduled with Dr. Theophilus. f symptoms do not improve or worsen, please contact office for sooner follow up or seek emerge   Mycobacterium avium complex (HCC) Prior bronchoscopy positive for MAC. Has seen infectious disease and reviewed potential treatment. Has deferred due to side effect profile.  Aware she may need to reconsider pending future control/flare ups. See above  Cavitary lung disease Stable on recent imaging. See above   Chronic respiratory failure with hypoxia (HCC) Stable without increased O2 requirements  Protein-calorie malnutrition, severe Weight loss stabilized. Up 6 lb since last illness. Continue to monitor closely. Encouraged high calorie, high protein diet  I discussed the assessment and treatment plan with the patient. The patient was provided an opportunity to ask questions and all were answered. The patient  agreed with the plan and demonstrated an understanding of the instructions.   The patient was advised to call back or seek an in-person evaluation if the symptoms worsen or if the condition fails to improve as anticipated.  I provided 31 minutes of non-face-to-face time during this encounter.  Comer LULLA Rouleau, NP 07/07/2024  Pt aware and understands NP's role.   "

## 2024-07-07 NOTE — Assessment & Plan Note (Signed)
Stable on recent imaging. See above

## 2024-07-07 NOTE — Assessment & Plan Note (Signed)
 Prior bronchoscopy positive for MAC. Has seen infectious disease and reviewed potential treatment. Has deferred due to side effect profile. Aware she may need to reconsider pending future control/flare ups. See above

## 2024-07-08 ENCOUNTER — Encounter: Payer: Self-pay | Admitting: Nurse Practitioner

## 2024-07-11 NOTE — Telephone Encounter (Signed)
 Good afternoon, Do you have the enrollment form for this patient for the Brinsupri?  The patient says the manufacturer needs the enrollment form so they can assist her.  Thank you.

## 2024-07-11 NOTE — Telephone Encounter (Signed)
 Faxed Brinsupri enrollment form, clinicals, and insurance card to [phone].   Will await response.

## 2024-07-11 NOTE — Telephone Encounter (Signed)
 Called provider services with [employer] for update -   They did not receive my fax on 06/20/24 despite this being the fax number listed on the PA denial to send appeals to. Representative was unable to explain why the incorrect fax number was listed on the PA denial for appeals.   He provided a different fax number: [phone] where appeals should be sent.   Faxed URGENT appeal to [phone] for Brinsupri.   xxxxxxxxxxxxxxxxxxxxxxxxxxxxxxxxxx  Called [employer] provider services again to ensure appeal was received when it was re-sent today. This time, a different representative advised me that they did receive the appeal that was sent on 06/20/24, and the denial was upheld. She is re-faxing the letter to us  with this information.  Call reference # [id number]  Will likely need to pursue PAP

## 2024-07-20 NOTE — Progress Notes (Signed)
 See updates in 05/26/24 [facility] (Brinsupri) thread.

## 2024-07-20 NOTE — Telephone Encounter (Signed)
 Received fax from [facility] that Brinsupri enrollment form was received. Sent to media tab.

## 2024-07-26 ENCOUNTER — Telehealth: Payer: Self-pay | Admitting: *Deleted

## 2024-07-26 NOTE — Telephone Encounter (Signed)
 Copied from CRM [id number]. Topic: Clinical - Prescription Issue >> Jul 24, 2024  8:56 AM Corean SAUNDERS wrote: Reason for CRM: [place]ise with [facility] states a prior authorization is needed for Brinsupri.   For any questions please call Ellouise at : [phone] >> Jul 26, 2024  8:23 AM Leila BROCKS wrote: Lauraine from [facility] [phone] checking on PA status Brinsupri 10 mg. Please call back.   -------------------------------------------------------------------------------------------------------------------------------------------------  Encounter Date: 05/26/2024   Signed      Received fax from [facility] that Brinsupri enrollment form was received. Sent to media tab.        Electronically signed by Veronia Aleck CROME, RPH-CPP at 07/20/2024  3:11 PM  Does anything further need to be done?

## 2024-07-28 NOTE — Telephone Encounter (Signed)
 Received call from [facility] requesting PA to be completed.   See prior updates in this thread - PA denied, appeal submitted, appeal denied. Patient assistance pursued.   Called [employer] for an update. Per representative, they are working on patient assistance program and will likely have update next week. She will cancel the Rx to [facility] and said we can disregard request for PA.

## 2024-07-28 NOTE — Telephone Encounter (Signed)
 Representative from [employer] requesting PA.   Submitted a Prior Authorization request to [employer] for BRINSUPRI via CoverMyMeds. Will update once we receive a response.  Key: [id number]   Please see [facility]) - a PA was submitted in December. It was denied. Appeal submitted. Appeal denied. Brinsupri enrollment form submitted 07/11/24. Rx triaged to [facility] per fax received on 07/20/24.

## 2024-07-28 NOTE — Telephone Encounter (Signed)
 Please see [facility]) for further updates.

## 2024-09-18 ENCOUNTER — Ambulatory Visit: Admitting: Internal Medicine

## 2024-09-20 ENCOUNTER — Ambulatory Visit: Admitting: Pulmonary Disease
